# Patient Record
Sex: Male | Born: 1935 | Race: Black or African American | Hispanic: No | Marital: Married | Smoking: Former smoker
Health system: Southern US, Community
[De-identification: ages and names within clinical notes are randomized; demographics above are authoritative.]

## PROBLEM LIST (undated history)

## (undated) DIAGNOSIS — A419 Sepsis, unspecified organism: Secondary | ICD-10-CM

## (undated) DIAGNOSIS — E785 Hyperlipidemia, unspecified: Secondary | ICD-10-CM

## (undated) DIAGNOSIS — F0390 Unspecified dementia without behavioral disturbance: Secondary | ICD-10-CM

## (undated) DIAGNOSIS — I10 Essential (primary) hypertension: Secondary | ICD-10-CM

## (undated) DIAGNOSIS — N390 Urinary tract infection, site not specified: Secondary | ICD-10-CM

## (undated) DIAGNOSIS — E039 Hypothyroidism, unspecified: Secondary | ICD-10-CM

## (undated) DIAGNOSIS — I959 Hypotension, unspecified: Secondary | ICD-10-CM

## (undated) DIAGNOSIS — R55 Syncope and collapse: Secondary | ICD-10-CM

## (undated) DIAGNOSIS — I2510 Atherosclerotic heart disease of native coronary artery without angina pectoris: Secondary | ICD-10-CM

## (undated) HISTORY — PX: BACK SURGERY: SHX140

## (undated) HISTORY — PX: THYROIDECTOMY, PARTIAL: SHX18

## (undated) HISTORY — PX: CORONARY ANGIOPLASTY WITH STENT PLACEMENT: SHX49

## (undated) HISTORY — DX: Hyperlipidemia, unspecified: E78.5

---

## 1997-06-23 ENCOUNTER — Inpatient Hospital Stay (HOSPITAL_COMMUNITY): Admission: EM | Admit: 1997-06-23 | Discharge: 1997-06-26 | Payer: Self-pay | Admitting: Urology

## 1997-07-06 ENCOUNTER — Inpatient Hospital Stay (HOSPITAL_COMMUNITY): Admission: RE | Admit: 1997-07-06 | Discharge: 1997-07-08 | Payer: Self-pay | Admitting: Urology

## 1997-07-14 ENCOUNTER — Ambulatory Visit (HOSPITAL_COMMUNITY): Admission: RE | Admit: 1997-07-14 | Discharge: 1997-07-14 | Payer: Self-pay | Admitting: Pulmonary Disease

## 1997-07-15 ENCOUNTER — Ambulatory Visit (HOSPITAL_COMMUNITY): Admission: RE | Admit: 1997-07-15 | Discharge: 1997-07-15 | Payer: Self-pay | Admitting: Pulmonary Disease

## 1997-07-17 ENCOUNTER — Ambulatory Visit (HOSPITAL_COMMUNITY): Admission: RE | Admit: 1997-07-17 | Discharge: 1997-07-17 | Payer: Self-pay | Admitting: Pulmonary Disease

## 2001-04-12 ENCOUNTER — Encounter: Admission: RE | Admit: 2001-04-12 | Discharge: 2001-04-12 | Payer: Self-pay | Admitting: Urology

## 2001-04-12 ENCOUNTER — Encounter: Payer: Self-pay | Admitting: Urology

## 2001-04-16 ENCOUNTER — Ambulatory Visit (HOSPITAL_BASED_OUTPATIENT_CLINIC_OR_DEPARTMENT_OTHER): Admission: RE | Admit: 2001-04-16 | Discharge: 2001-04-16 | Payer: Self-pay | Admitting: Urology

## 2002-01-09 ENCOUNTER — Encounter: Payer: Self-pay | Admitting: Orthopedic Surgery

## 2002-01-09 ENCOUNTER — Encounter: Admission: RE | Admit: 2002-01-09 | Discharge: 2002-01-09 | Payer: Self-pay | Admitting: Orthopedic Surgery

## 2002-02-25 ENCOUNTER — Encounter: Payer: Self-pay | Admitting: Emergency Medicine

## 2002-02-25 ENCOUNTER — Inpatient Hospital Stay (HOSPITAL_COMMUNITY): Admission: EM | Admit: 2002-02-25 | Discharge: 2002-02-28 | Payer: Self-pay | Admitting: Emergency Medicine

## 2002-02-25 HISTORY — PX: CARDIAC CATHETERIZATION: SHX172

## 2002-03-13 HISTORY — PX: TRANSTHORACIC ECHOCARDIOGRAM: SHX275

## 2002-09-24 ENCOUNTER — Encounter: Payer: Self-pay | Admitting: Gastroenterology

## 2002-09-24 ENCOUNTER — Encounter: Admission: RE | Admit: 2002-09-24 | Discharge: 2002-09-24 | Payer: Self-pay | Admitting: Gastroenterology

## 2002-10-24 ENCOUNTER — Ambulatory Visit (HOSPITAL_COMMUNITY): Admission: RE | Admit: 2002-10-24 | Discharge: 2002-10-24 | Payer: Self-pay | Admitting: Gastroenterology

## 2002-10-27 ENCOUNTER — Encounter: Payer: Self-pay | Admitting: Gastroenterology

## 2002-10-27 ENCOUNTER — Ambulatory Visit (HOSPITAL_COMMUNITY): Admission: RE | Admit: 2002-10-27 | Discharge: 2002-10-27 | Payer: Self-pay | Admitting: Gastroenterology

## 2004-10-14 ENCOUNTER — Encounter: Admission: RE | Admit: 2004-10-14 | Discharge: 2004-10-14 | Payer: Self-pay | Admitting: Neurosurgery

## 2005-01-23 ENCOUNTER — Encounter: Admission: RE | Admit: 2005-01-23 | Discharge: 2005-01-23 | Payer: Self-pay | Admitting: Neurosurgery

## 2005-04-01 ENCOUNTER — Emergency Department (HOSPITAL_COMMUNITY): Admission: EM | Admit: 2005-04-01 | Discharge: 2005-04-01 | Payer: Self-pay | Admitting: Emergency Medicine

## 2005-08-17 HISTORY — PX: OTHER SURGICAL HISTORY: SHX169

## 2006-12-18 HISTORY — PX: CARDIOVASCULAR STRESS TEST: SHX262

## 2007-03-31 ENCOUNTER — Emergency Department (HOSPITAL_COMMUNITY): Admission: EM | Admit: 2007-03-31 | Discharge: 2007-03-31 | Payer: Self-pay | Admitting: Emergency Medicine

## 2008-10-21 ENCOUNTER — Encounter: Admission: RE | Admit: 2008-10-21 | Discharge: 2008-12-31 | Payer: Self-pay | Admitting: Optometry

## 2010-01-22 ENCOUNTER — Encounter: Payer: Self-pay | Admitting: Gastroenterology

## 2010-01-23 ENCOUNTER — Encounter: Payer: Self-pay | Admitting: Radiology

## 2010-05-20 NOTE — Cardiovascular Report (Signed)
NAME:  Jeremy [name], Jeremy [name] NO.:  [id number]   MEDICAL RECORD NO.:  [id number]                   PATIENT TYPE:  INP   LOCATION:  1825                                 FACILITY:  [facility]   PHYSICIAN:  Nanetta Batty, M.D.                DATE OF BIRTH:  August 07, 1925   DATE OF PROCEDURE:  DATE OF DISCHARGE:                              CARDIAC CATHETERIZATION   PROCEDURE:  Emergency cardiac catheterization/PCI and stenting.   INDICATIONS FOR PROCEDURE:  The patient is a 77 year old retired black male,  father of 4, patient of Dr. Kellie Shropshire.  He was awakened at 4:30 this  morning with chest pain and left arm pain.  He came to the emergency room  where he was found to have nonspecific ST and T wave changes and positive  enzymes.  Because of continued discomfort, he was brought to the  catheterization lab emergently for angiography and intervention.   DESCRIPTION OF PROCEDURE:  The patient was brought to the second floor [facility] in the postabsorptive state.  He was  premedicated with p.o. Valium.  His right groin was prepped and shaved in  the usual sterile fashion.  Xylocaine, 1%, was used for local anesthesia.  A  6-French, upgraded to a 7-French, sheath was inserted into the right femoral  artery using the standard Seldinger technique.  A 6-French sheath was  inserted into the right femoral vein.  The 6-French right and left Judkins  diagnostic catheters along with a 6-French pigtail catheter were used for  selective coronary angiography, left ventriculography, subselective left  internal mammary artery angiography, and distal abdominal aortography.  Omnipaque dye was used for the entirety of the case.  Retrograde aortic and  left ventricular pullback pressures were recorded.   HEMODYNAMICS:  1. Aortic systolic pressure 137, diastolic pressure 86.  2. Left ventricular systolic pressure 134, end diastolic pressure 13.   SELECTIVE CORONARY ANGIOGRAPHY:  1. Left main:  Normal.  2. LAD:  Normal.  3. Left circumflex:  The left circumflex had a 95% ulcerated lesion in its     proximal portion, straddling a small marginal branch.  4. Right coronary artery:  This is a codominant vessel with a 50% distal     segmental stenosis.  5. Left ventriculography:  RAO left ventriculogram was performed using 25 cc     of Omnipaque dye at 12 cc/second.  The overall LVEF was estimated at     greater than 60% with subtle inferobasal hypokinesia.  6. Left internal mammary artery:  This vessel was subselectively visualized     and was widely patent.  It is suitable for use during coronary artery     bypass grafting.  7. Distal abdominal aortography:  Omnipaque dye, 20 cc at 20 cc/second, was     used.  The renal arteries revealed 40% right renal artery stenosis.  Infrarenal abdominal aorta and iliac bifurcation appear free of     significant obstruction.   IMPRESSION:  The patient has SEMI with the circumflex being the culprit  vessel.  We will proceed with PCI and stenting; he is taking Integrilin.   PROCEDURE DESCRIPTION:  The patient received 5000 units of heparin  intravenously in the ER with an ACT of 305 at the end of the procedure.  He  received 4 baby aspirin and 300 mg of Plavix p.o. as well as Integrilin  double bolus and infusion. Using a 7-French JL4 guide catheter and also a  0.014-inch x 190-cm support guidewire, and a 2.5 x 15-mm Maverick, PTCA was  performed.  Following this, a 3.0 x 18-mm CYPHER stent was then deployed  across the stenosis, carefully avoiding the second large marginal branch and  jailing the first smaller marginal branch.  It was deployed at 13  atmospheres.  Intracoronary nitroglycerin, 200 mcg, was administered.  Final  angiographic result revealed reduction of a 90% to 95% ulcerated proximal  left circumflex coronary artery stenosis to 0% residual.  There was TIMI-2  flow in the  small first marginal branch at the end of the procedure.  Attempts were made to cannulate this with an 0.014-inch support wire;  however, because of the nature of the takeoff, the vessel was never able to  be entered with the wire.  The patient was pain-free, and there were no  electrocardiographic changes at the end of the procedure.  The guidewire and  catheters were removed.  Sheaths were secured in place.  The patient left  the lab in stable condition.  Sheaths will be removed once the ACT falls  below 150.  Integrilin will be continued for 18 hours.  The patient will be  treated with aspirin and Plavix, beta blocker, ACE inhibitor, and a statin  drug.  He left the lab in stable condition.                                               Nanetta Batty, M.D.    Cordelia Pen  D:  02/25/2002  T:  02/25/2002  Job:  [id number]   cc:   Cardiac Catheterization Lab   [facility]   [place]. Renae Gloss, M.D.  [address]  [address]  [place]  [place] [address]  Fax: [phone]

## 2010-05-20 NOTE — Discharge Summary (Signed)
NAME:  Jeremy [name], Jeremy [name] NO.:  [id number]   MEDICAL RECORD NO.:  [id number]                   PATIENT TYPE:  INP   LOCATION:  4740                                 FACILITY:  [facility]   PHYSICIAN:  Nanetta Batty, M.D.                DATE OF BIRTH:  07/25/1925   DATE OF ADMISSION:  DATE OF DISCHARGE:  02/28/2002                                 DISCHARGE SUMMARY   DISCHARGE DIAGNOSES:  1. Status post known Q wave myocardial infarction, this admission.  2. Status post catheterization with stenting of the midcircumflex artery.  3. Newly diagnosed hyperlipidemia with abnormal lipid profile, statin     therapy started.  4. Newly diagnosed hyperthyroidism status post thyroid surgery a number of     years ago.  5. Newly diagnosed left subclavian murmur versus transmitted cardiac murmur     and diminished left radial pulse.  6. Arthritis.   HISTORY OF PRESENT ILLNESS:  This patient is a 77 year old African-American  married gentleman with no prior history of coronary artery disease and no  hypertension or diabetes and unknown lipid status.  He presented to the  emergency room with complaints of severe chest pain, left arm and left  shoulder pain.  The onset of pain occurred during the night at around 4: 30  a.m. and the patient was delivered to the emergency room around 6 a.m.  On  admission, his EKG did not show specific elevation of the ST segments, it  showed just ischemic depression of the ST in leads V1 through V3.   PAST MEDICAL HISTORY:  His past medical history is significant for remote  thyroid surgery a number of years ago and he has arthritis and is status  post back surgery with pain in the lower extremities.   ALLERGIES:  No known drug allergies.   FAMILY HISTORY:  Family history is not remarkable for any coronary artery  disease such as CVAs.   SOCIAL HISTORY:  He is married for 50 years.  This Saturday, tomorrow, he is  going to celebrate  his 50th wedding anniversary.  He has 4 children, 4  grandchildren.  Retired from [employer].  Ex-smoker,  approximately 35 years ago.  Exercises very little.   ALLERGIES:  No known drug allergies.   HOME MEDICATIONS:  Celebrex twice a day, dose uncertain.   HOSPITAL COURSE:  The patient was admitted on IV nitroglycerin and heparin;  and underwent cardiac catheterization the day of his admission.  Catheterization revealed approximately 50% of distal RCA lesion and concrete  lesion in the midcircumflex with 90%; PTCA and stenting with Cypher stent  was performed and occluding was reduced from 90-0%.  He still has a residual  disease from the ostial diagonal branch with 99% stenosis.  Attempt was made  to cross that lesion, but unsuccessfully unable to wire.   The patient tolerated the procedure  well.  He was seen the next morning with  no complaints.  Just said that he felt great.  The same he repeated the  morning of his discharge on 02/27; he said he felt great and was able to get  discharged from the hospital and ready for his wedding anniversary.   HOSPITAL STUDIES:  Laboratories:  TSH was abnormally elevated at 93.81 and  he was started on Synthroid 50 mcg.  His cardiac enzymes were elevated.  The  first set was CK 751, MB 13.8, troponin 0.43; second set CK 4958, CK/MB of  523.6, and peak troponin was 42.9; his last troponin was 33.46, CK 2013, and  CK/MB 118.   His CBC on the day of discharge revealed a hemoglobin of 11.3, hematocrit  34.7, potassium 3.7, BUN 18, creatinine 1.1.   His lipid profile showed total cholesterol 258, triglycerides 195, LDL 175,  and HDL 44.   The patient was assessed the morning of his discharge by Dr. Elsie Lincoln.  He has  right subclavian bruit and it was unclear to [place] if the bruit was actually  because of the stenotic lesion in the left subclavian artery or transmitted  cardiac murmur.  Also, his right radial pulse was 3+ and left  radial pulse  was 1+ to 2+.  The patient was discharged home in improved and stable  condition.   RECOMMENDATIONS:  No driving, no lifting greater than 5 pounds, no strenuous  physical activity until seen in the office by Dr. Allyson Sabal.  He was allowed to  shower and instructed not to rub groin site, but to wash it with mild soap  gently and pat dry.  Any problems need to be reported to Dr. Hazle Coca office  at [phone]. Office will call patient to set up follow up with Dr. Allyson Sabal and  also he was scheduled for a 2-D echocardiogram to assess cardiac murmur and  for upper extremity ultrasound to evaluate possible left subclavian bruit.   DISCHARGE MEDICATIONS:  1. Aspirin 81 mg daily.  2. Plavix 75 mg daily for at least 3 months after Cypher stent.  3. Toprol XL 25 mg daily.  4. Altace 2.5 mg daily.  5. Protonix 40 mg daily.  6. Synthroid 50 mcg daily.  7. Zocor 40 mg daily.     Raymon Mutton, P.A.                    Nanetta Batty, M.D.    MK/MEDQ  D:  02/28/2002  T:  02/28/2002  Job:  [id number]   cc:   Nanetta Batty, M.D.  [address]  [place]  [place] [address]  Fax: [phone]   Merlene Laughter. Renae Gloss, M.D.  [address]  [address]  [place]  [place] [address]  Fax: [phone]

## 2010-05-20 NOTE — Op Note (Signed)
   NAME:  Jeremy [name], MINION NO.:  [id number]   MEDICAL RECORD NO.:  [id number]                   PATIENT TYPE:  AMB   LOCATION:  ENDO                                 FACILITY:  [facility]   PHYSICIAN:  Anselmo Rod, M.D.               DATE OF BIRTH:  08-18-1925   DATE OF PROCEDURE:  10/24/2002  DATE OF DISCHARGE:                                 OPERATIVE REPORT   PROCEDURE PERFORMED:  Colonoscopy.   ENDOSCOPIST:  Anselmo Rod, M.D.   INSTRUMENT USED:  Olympus video colonoscope.   INDICATION FOR PROCEDURE:  A 77 year old African-American male with a  history of abnormal weight loss, undergoing screening colonoscopy.  Rule out  colonic polyps, masses, etc.   PREPROCEDURE PREPARATION:  Informed consent was procured from the patient.  The patient was fasted for eight hours prior to the procedure and prepped  with a bottle of magnesium citrate and a gallon of GoLYTELY the night prior  to the procedure.   PREPROCEDURE PHYSICAL:  VITAL SIGNS:  The patient had stable vital signs.  NECK:  Supple.  CHEST:  Clear to auscultation.  S1, S2 regular.  ABDOMEN:  Soft with normal bowel sounds.   DESCRIPTION OF PROCEDURE:  The patient was placed in the left lateral  decubitus position and sedated with 70 mg of Demerol and 7 mg of Versed  intravenously.  Once the patient was adequately sedate and maintained on low-  flow oxygen and continuous cardiac monitoring, the Olympus video colonoscope  was advanced from the rectum to the cecum and terminal ileum without  difficulty.  Except for small internal hemorrhoids, no other masses were  seen.  The patient tolerated the procedure well without complications.   IMPRESSION:  Normal colonoscopy up to the terminal ileum except for small  internal hemorrhoids.   RECOMMENDATIONS:  1. Upper GI with small bowel follow-through will be done to complete the     evaluation.  2. Repeat CRC screening has been recommended in the  next 10 years unless the     patient develops any abnormal     symptoms in the interim.  3. Outpatient follow-up after the upper GI small bowel follow-through has     been done.  4. A high-fiber diet with liberal fluid intake.                                               Anselmo Rod, M.D.    JNM/MEDQ  D:  10/24/2002  T:  10/25/2002  Job:  [id number]   cc:   Merlene Laughter. Renae Gloss, M.D.  [address]  [address]  [place]  [place] [address]  Fax: [phone]

## 2010-09-26 LAB — CBC
HCT: 39.7
Hemoglobin: 13.4
MCHC: 33.8
MCV: 92.2
RBC: 4.31

## 2010-09-26 LAB — DIFFERENTIAL
Basophils Absolute: 0
Basophils Relative: 1
Eosinophils Relative: 4
Neutrophils Relative %: 66

## 2010-09-26 LAB — POCT CARDIAC MARKERS
CKMB, poc: 7.1
Operator id: 3206
Troponin i, poc: <0.05

## 2010-09-26 LAB — BASIC METABOLIC PANEL
BUN: 20
Calcium: 9.3
GFR calc Af Amer: 45 — ABNORMAL LOW
GFR calc non Af Amer: 38 — ABNORMAL LOW
Potassium: 4

## 2011-04-09 ENCOUNTER — Other Ambulatory Visit: Payer: Self-pay

## 2011-04-09 ENCOUNTER — Encounter (HOSPITAL_COMMUNITY): Payer: Self-pay

## 2011-04-09 ENCOUNTER — Emergency Department (HOSPITAL_COMMUNITY)
Admission: EM | Admit: 2011-04-09 | Discharge: 2011-04-09 | Disposition: A | Discharge status: Home / Self Care | Payer: Medicare Other | Attending: Emergency Medicine | Admitting: Emergency Medicine

## 2011-04-09 DIAGNOSIS — R5383 Other fatigue: Secondary | ICD-10-CM | POA: Insufficient documentation

## 2011-04-09 DIAGNOSIS — N390 Urinary tract infection, site not specified: Secondary | ICD-10-CM | POA: Insufficient documentation

## 2011-04-09 DIAGNOSIS — R55 Syncope and collapse: Secondary | ICD-10-CM | POA: Insufficient documentation

## 2011-04-09 DIAGNOSIS — R5381 Other malaise: Secondary | ICD-10-CM | POA: Insufficient documentation

## 2011-04-09 LAB — URINALYSIS, ROUTINE W REFLEX MICROSCOPIC
Ketones, ur: NEGATIVE mg/dL
Nitrite: NEGATIVE
Protein, ur: 30 mg/dL — AB
Specific Gravity, Urine: 1.019 (ref 1.005–1.030)
Urobilinogen, UA: 1 mg/dL (ref 0.0–1.0)

## 2011-04-09 LAB — URINE MICROSCOPIC-ADD ON

## 2011-04-09 LAB — COMPREHENSIVE METABOLIC PANEL
ALT: 12 U/L (ref 0–53)
Albumin: 3.5 g/dL (ref 3.5–5.2)
Alkaline Phosphatase: 59 U/L (ref 39–117)
BUN: 16 mg/dL (ref 6–23)
CO2: 23 mEq/L (ref 19–32)
Chloride: 104 mEq/L (ref 96–112)
Creatinine, Ser: 1.21 mg/dL (ref 0.50–1.35)
GFR calc non Af Amer: 54 mL/min — ABNORMAL LOW (ref >90)
Potassium: 4.9 mEq/L (ref 3.5–5.1)
Total Protein: 7.2 g/dL (ref 6.0–8.3)

## 2011-04-09 LAB — DIFFERENTIAL
Basophils Absolute: 0.1 10*3/uL (ref 0.0–0.1)
Lymphs Abs: 2.6 10*3/uL (ref 0.7–4.0)
Neutro Abs: 3.7 10*3/uL (ref 1.7–7.7)

## 2011-04-09 LAB — CBC
MCH: 28.8 pg (ref 26.0–34.0)
MCHC: 33.8 g/dL (ref 30.0–36.0)

## 2011-04-09 LAB — OCCULT BLOOD, POC DEVICE: Fecal Occult Bld: NEGATIVE

## 2011-04-09 LAB — POCT I-STAT TROPONIN I: Troponin i, poc: 0.01 ng/mL (ref 0.00–0.08)

## 2011-04-09 MED ORDER — CIPROFLOXACIN HCL 500 MG PO TABS: 500.0000 mg | ORAL_TABLET | Freq: Two times a day (BID) | ORAL | Status: AC

## 2011-04-09 NOTE — ED Notes (Signed)
Patient denies pain and is resting comfortably.  

## 2011-04-09 NOTE — ED Notes (Signed)
Pt reports he set down after communion, became nauseous and felt hot all over, family at bedside reports pt began sweating. Pt reports he set down because his ankle always get weak after standing for a long period of time d/t hx of sciatica. Pt denies LOC, hitting his head, or falling. Pt denies chest/abd pain, diarrhea, sob, dizziness, or recent cough and congestion. Pt and family reports he had a similar episode in September while traveling to [place].

## 2011-04-09 NOTE — Discharge Instructions (Signed)
[name], you had physical examination, laboratory tests, EKG and chest x-ray to check on you after you nearly fainted today.  Your tests showed that you have a urinary tract infection. Take the antibiotic medicine Cipro 500 mg twice a day for one week to treat your urinary tract infection.  Call Dr. Mathews Robinsons office to make a followup appointment.

## 2011-04-09 NOTE — ED Notes (Signed)
Family at bedside. 

## 2011-04-09 NOTE — ED Notes (Signed)
Per EMS, pt from church, pt stood up for communion became nauseated and weak, pt was a/o x3 for ems, vomited x4 and diaphoretic, clammy to touch, bp 90/60 hr 58 upon ed arrival, currently bp 120/81, Zofran 4 mg IM given en route, unable to obtain IV access en route

## 2011-04-09 NOTE — ED Notes (Signed)
MD at bedside. 

## 2011-04-09 NOTE — ED Notes (Signed)
Pt reports his suit jacket was left on the ambulance, pt's daughter given [facility] communication number and instructions on how to contact them for pt's belongings

## 2011-04-09 NOTE — ED Provider Notes (Signed)
History     CSN: [id number]  Arrival date & time 04/09/11  1004   None     Chief Complaint  Patient presents with  . Near Syncope    (Consider location/radiation/quality/duration/timing/severity/associated sxs/prior treatment) HPI Comments: The patient is an 78 year old man who was at church. He got up to go to communion, and became weak, with nausea and sweating. He had to sit down. He says this spell lasted for 3 or 4 minutes. He was therefore brought to [facility] cone the ED for evaluation. He denies chest pain or shortness of breath. He had a prior similar episode last year. At that time he was found to have a bleeding ulcer. Has not seen any vomiting, or blood in his stool.  Patient is a 78 y.o. male presenting with syncope. The history is provided by the patient and a relative. No language interpreter was used.  Loss of Consciousness This is a new problem. The current episode started less than 1 hour ago. The problem has been resolved. Pertinent negatives include no chest pain, no abdominal pain, no headaches and no shortness of breath. The symptoms are aggravated by nothing. The symptoms are relieved by nothing. Treatments tried: Was brought to the [facility] ED via EMS.    No past medical history on file.  No past surgical history on file.  No family history on file.  History  Substance Use Topics  . Smoking status: Not on file  . Smokeless tobacco: Not on file  . Alcohol Use: Not on file      Review of Systems  Constitutional:       Weakness, near syncope.  HENT: Negative.   Eyes: Negative.   Respiratory: Negative.  Negative for shortness of breath.   Cardiovascular: Positive for syncope. Negative for chest pain.  Gastrointestinal: Negative for abdominal pain.  Genitourinary: Negative.   Musculoskeletal: Negative.   Skin: Negative.   Neurological: Negative.  Negative for headaches.  Psychiatric/Behavioral: Negative.     Allergies  Review of patient's  allergies indicates not on file.  Home Medications  No current outpatient prescriptions on file.  BP 119/75  Pulse 56  Temp(Src) 97.5 F (36.4 C) (Oral)  Resp 18  SpO2 99%  Physical Exam  Nursing note and vitals reviewed. Constitutional: He is oriented to person, place, and time. He appears well-developed and well-nourished. No distress.  HENT:  Head: Normocephalic and atraumatic.  Right Ear: External ear normal.  Left Ear: External ear normal.  Eyes: Conjunctivae and EOM are normal. Pupils are equal, round, and reactive to light.  Neck: Normal range of motion. Neck supple.  Cardiovascular: Normal rate, regular rhythm and normal heart sounds.   Pulmonary/Chest: Effort normal and breath sounds normal.  Abdominal: Soft. Bowel sounds are normal.  Musculoskeletal: Normal range of motion. He exhibits no edema and no tenderness.  Neurological: He is alert and oriented to person, place, and time.       No sensory or motor deficit.  Skin: Skin is warm and dry.  Psychiatric: He has a normal mood and affect. His behavior is normal.    ED Course  Procedures (including critical care time)   10:31 AM  Date: 04/09/2011  Rate:53  Rhythm: sinus bradycardia  QRS Axis: left  Intervals: normal QRS:  Left ventricular hypertrophy.  Poor R wave progression in precordial leads suggests possible old anterior myocardial infarction.   ST/T Wave abnormalities: normal  Conduction Disutrbances:right bundle branch block  Narrative Interpretation: Abnormal EKG.  Old  EKG Reviewed: changes noted--Poor R wave progression, RBBB are new since tracing of 03/31/2007.  11:13 AM Pt was seen and had physical exam performed.  Lab workup was ordered.  Old charts were ordered.  3:24 PM Results for orders placed during the hospital encounter of 04/09/11  GLUCOSE, CAPILLARY      Component Value Range   Glucose-Capillary 91  70 - 99 (mg/dL)  CBC      Component Value Range   WBC 8.0  4.0 - 10.5 (K/uL)   RBC  4.66  4.22 - 5.81 (MIL/uL)   Hemoglobin 13.4  13.0 - 17.0 (g/dL)   HCT 16.1  09.6 - 04.5 (%)   MCV 85.0  78.0 - 100.0 (fL)   MCH 28.8  26.0 - 34.0 (pg)   MCHC 33.8  30.0 - 36.0 (g/dL)   RDW 40.9 (*) 81.1 - 15.5 (%)   Platelets 154  150 - 400 (K/uL)  DIFFERENTIAL      Component Value Range   Neutrophils Relative 46  43 - 77 (%)   Neutro Abs 3.7  1.7 - 7.7 (K/uL)   Lymphocytes Relative 33  12 - 46 (%)   Lymphs Abs 2.6  0.7 - 4.0 (K/uL)   Monocytes Relative 16 (*) 3 - 12 (%)   Monocytes Absolute 1.3 (*) 0.1 - 1.0 (K/uL)   Eosinophils Relative 5  0 - 5 (%)   Eosinophils Absolute 0.4  0.0 - 0.7 (K/uL)   Basophils Relative 1  0 - 1 (%)   Basophils Absolute 0.1  0.0 - 0.1 (K/uL)  COMPREHENSIVE METABOLIC PANEL      Component Value Range   Sodium 137  135 - 145 (mEq/L)   Potassium 4.9  3.5 - 5.1 (mEq/L)   Chloride 104  96 - 112 (mEq/L)   CO2 23  19 - 32 (mEq/L)   Glucose, Bld 101 (*) 70 - 99 (mg/dL)   BUN 16  6 - 23 (mg/dL)   Creatinine, Ser 9.14  0.50 - 1.35 (mg/dL)   Calcium 9.1  8.4 - 78.2 (mg/dL)   Total Protein 7.2  6.0 - 8.3 (g/dL)   Albumin 3.5  3.5 - 5.2 (g/dL)   AST 33  0 - 37 (U/L)   ALT 12  0 - 53 (U/L)   Alkaline Phosphatase 59  39 - 117 (U/L)   Total Bilirubin 0.4  0.3 - 1.2 (mg/dL)   GFR calc non Af Amer 54 (*) >90 (mL/min)   GFR calc Af Amer 62 (*) >90 (mL/min)  POCT I-STAT TROPONIN I      Component Value Range   Troponin i, poc 0.01  0.00 - 0.08 (ng/mL)   Comment 3           URINALYSIS, ROUTINE W REFLEX MICROSCOPIC      Component Value Range   Color, Urine AMBER (*) YELLOW    APPearance TURBID (*) CLEAR    Specific Gravity, Urine 1.019  1.005 - 1.030    pH 6.5  5.0 - 8.0    Glucose, UA NEGATIVE  NEGATIVE (mg/dL)   Hgb urine dipstick MODERATE (*) NEGATIVE    Bilirubin Urine SMALL (*) NEGATIVE    Ketones, ur NEGATIVE  NEGATIVE (mg/dL)   Protein, ur 30 (*) NEGATIVE (mg/dL)   Urobilinogen, UA 1.0  0.0 - 1.0 (mg/dL)   Nitrite NEGATIVE  NEGATIVE    Leukocytes,  UA LARGE (*) NEGATIVE   URINE MICROSCOPIC-ADD ON      Component Value Range  WBC, UA TOO NUMEROUS TO COUNT  <3 (WBC/hpf)   RBC / HPF 7-10  <3 (RBC/hpf)   Bacteria, UA MANY (*) RARE   OCCULT BLOOD, POC DEVICE      Component Value Range   Fecal Occult Bld NEGATIVE     No results found.  Pt's labs showed a UTI.  Will Rx with po Cipro.  He feels back to normal, so released home.      1. Urinary tract infection   2. Near syncope             Carleene Cooper III, MD 04/09/11 [phone]

## 2011-04-09 NOTE — ED Notes (Signed)
Pt reports he has not been drinking enough water but eats on a regular schedule

## 2012-02-09 ENCOUNTER — Emergency Department (HOSPITAL_COMMUNITY): Payer: Medicare Other

## 2012-02-09 ENCOUNTER — Emergency Department (HOSPITAL_COMMUNITY)
Admission: EM | Admit: 2012-02-09 | Discharge: 2012-02-09 | Disposition: A | Discharge status: Home / Self Care | Payer: Medicare Other | Attending: Emergency Medicine | Admitting: Emergency Medicine

## 2012-02-09 ENCOUNTER — Encounter (HOSPITAL_COMMUNITY): Payer: Self-pay | Admitting: Emergency Medicine

## 2012-02-09 DIAGNOSIS — Z87891 Personal history of nicotine dependence: Secondary | ICD-10-CM | POA: Insufficient documentation

## 2012-02-09 DIAGNOSIS — S0990XA Unspecified injury of head, initial encounter: Secondary | ICD-10-CM | POA: Insufficient documentation

## 2012-02-09 DIAGNOSIS — Y9289 Other specified places as the place of occurrence of the external cause: Secondary | ICD-10-CM | POA: Insufficient documentation

## 2012-02-09 DIAGNOSIS — R55 Syncope and collapse: Secondary | ICD-10-CM | POA: Insufficient documentation

## 2012-02-09 DIAGNOSIS — I10 Essential (primary) hypertension: Secondary | ICD-10-CM | POA: Insufficient documentation

## 2012-02-09 DIAGNOSIS — Z7902 Long term (current) use of antithrombotics/antiplatelets: Secondary | ICD-10-CM | POA: Insufficient documentation

## 2012-02-09 DIAGNOSIS — E079 Disorder of thyroid, unspecified: Secondary | ICD-10-CM | POA: Insufficient documentation

## 2012-02-09 DIAGNOSIS — W1809XA Striking against other object with subsequent fall, initial encounter: Secondary | ICD-10-CM | POA: Insufficient documentation

## 2012-02-09 DIAGNOSIS — Z9861 Coronary angioplasty status: Secondary | ICD-10-CM | POA: Insufficient documentation

## 2012-02-09 DIAGNOSIS — Z8719 Personal history of other diseases of the digestive system: Secondary | ICD-10-CM | POA: Insufficient documentation

## 2012-02-09 DIAGNOSIS — Z7982 Long term (current) use of aspirin: Secondary | ICD-10-CM | POA: Insufficient documentation

## 2012-02-09 DIAGNOSIS — N390 Urinary tract infection, site not specified: Secondary | ICD-10-CM | POA: Insufficient documentation

## 2012-02-09 DIAGNOSIS — Z79899 Other long term (current) drug therapy: Secondary | ICD-10-CM | POA: Insufficient documentation

## 2012-02-09 DIAGNOSIS — Y9301 Activity, walking, marching and hiking: Secondary | ICD-10-CM | POA: Insufficient documentation

## 2012-02-09 DIAGNOSIS — Z8739 Personal history of other diseases of the musculoskeletal system and connective tissue: Secondary | ICD-10-CM | POA: Insufficient documentation

## 2012-02-09 LAB — URINALYSIS, ROUTINE W REFLEX MICROSCOPIC
Hgb urine dipstick: NEGATIVE
Ketones, ur: NEGATIVE mg/dL
Protein, ur: NEGATIVE mg/dL
Urobilinogen, UA: 0.2 mg/dL (ref 0.0–1.0)

## 2012-02-09 LAB — CBC WITH DIFFERENTIAL/PLATELET
Basophils Absolute: 0.1 10*3/uL (ref 0.0–0.1)
Basophils Relative: 1 % (ref 0–1)
Eosinophils Absolute: 0.5 10*3/uL (ref 0.0–0.7)
Eosinophils Relative: 6 % — ABNORMAL HIGH (ref 0–5)
MCH: 29.6 pg (ref 26.0–34.0)
MCHC: 33.6 g/dL (ref 30.0–36.0)
MCV: 87.9 fL (ref 78.0–100.0)
Platelets: 143 10*3/uL — ABNORMAL LOW (ref 150–400)
RDW: 14.4 % (ref 11.5–15.5)
WBC: 7.6 10*3/uL (ref 4.0–10.5)

## 2012-02-09 LAB — POCT I-STAT TROPONIN I
Troponin i, poc: 0.01 ng/mL (ref 0.00–0.08)
Troponin i, poc: 0 ng/mL (ref 0.00–0.08)

## 2012-02-09 LAB — BASIC METABOLIC PANEL
Calcium: 9.3 mg/dL (ref 8.4–10.5)
Creatinine, Ser: 0.98 mg/dL (ref 0.50–1.35)
GFR calc non Af Amer: 73 mL/min — ABNORMAL LOW (ref >90)
Sodium: 137 mEq/L (ref 135–145)

## 2012-02-09 LAB — URINE MICROSCOPIC-ADD ON

## 2012-02-09 MED ORDER — CEPHALEXIN 500 MG PO CAPS: 500.0000 mg | ORAL_CAPSULE | Freq: Two times a day (BID) | ORAL | Status: DC

## 2012-02-09 NOTE — ED Notes (Signed)
The urine specimen that was collected was not enough urine to process both urine tests ordered; Jeraldine Loots, MD notified and urine was sent to process only one test at this time; Tiffany, RN made aware

## 2012-02-09 NOTE — ED Provider Notes (Signed)
History     CSN: [id number]  Arrival date & time 02/09/12  1340   First MD Initiated Contact with Patient 02/09/12 1347      Chief Complaint  Patient presents with  . Near Syncope  . Fall    (Consider location/radiation/quality/duration/timing/severity/associated sxs/prior treatment) HPI Comments: Patient is an 79 year old male with a past medical history of hypertension who presents after a syncopal episode that occurred today. Patient reports eating cereal and feeling nauseous for about an hour. He then went to stand up from the seated position at the table. He walked into the next room and "passed out" and hit his head on the wall on the way down. Patient reports LOC. Patient had an episode of emesis as well. Patient denies any other associated symptoms. He reports having syncopal episodes in the past and has been evaluated for syncope. Patient was seen here in April 2013 for the same thing and was diagnosed with a UTI. Patient takes Plavix for previous stent placement.    Past Medical History  Diagnosis Date  . Bleeding ulcer   . Spinal stenosis   . Thyroid disease   . Hypertension     Past Surgical History  Procedure Date  . Coronary angioplasty with stent placement   . Thyroidectomy, partial     History reviewed. No pertinent family history.  History  Substance Use Topics  . Smoking status: Former Games developer  . Smokeless tobacco: Not on file  . Alcohol Use: No      Review of Systems  Neurological: Positive for syncope.  All other systems reviewed and are negative.    Allergies  Review of patient's allergies indicates no known allergies.  Home Medications   Current Outpatient Rx  Name  Route  Sig  Dispense  Refill  . ASPIRIN EC 81 MG PO TBEC   Oral   Take 81 mg by mouth daily.         Marland Kitchen CLOPIDOGREL BISULFATE 75 MG PO TABS   Oral   Take 75 mg by mouth daily.         Marland Kitchen LEVOTHYROXINE SODIUM 75 MCG PO TABS   Oral   Take 75 mcg by mouth daily before  breakfast.         . METOPROLOL SUCCINATE ER 25 MG PO TB24   Oral   Take 25 mg by mouth daily.         Marland Kitchen PANTOPRAZOLE SODIUM 40 MG PO TBEC   Oral   Take 40 mg by mouth daily.         Marland Kitchen SIMVASTATIN 40 MG PO TABS   Oral   Take 40 mg by mouth every morning.           BP 146/96  Pulse 65  Temp 97.8 F (36.6 C) (Oral)  SpO2 98%  Physical Exam  Nursing note and vitals reviewed. Constitutional: He is oriented to person, place, and time. He appears well-developed and well-nourished. No distress.  HENT:  Head: Normocephalic and atraumatic.  Mouth/Throat: Oropharynx is clear and moist. No oropharyngeal exudate.  Eyes: Conjunctivae normal and EOM are normal. Pupils are equal, round, and reactive to light.  Neck:       c-collar intact  Cardiovascular: Normal rate and regular rhythm.  Exam reveals no gallop and no friction rub.   No murmur heard. Pulmonary/Chest: Effort normal and breath sounds normal. He has no wheezes. He has no rales. He exhibits no tenderness.  Abdominal: Soft. He exhibits  no distension. There is no tenderness. There is no rebound and no guarding.  Musculoskeletal: Normal range of motion.  Neurological: He is alert and oriented to person, place, and time. No cranial nerve deficit. Coordination normal.       Strength and sensation equal and intact bilaterally. Cerebellar testing done without difficulty. Speech is goal-oriented. Moves limbs without ataxia.   Skin: Skin is warm and dry. He is not diaphoretic.  Psychiatric: He has a normal mood and affect. His behavior is normal.    ED Course  Procedures (including critical care time)  Labs Reviewed  CBC WITH DIFFERENTIAL - Abnormal; Notable for the following:    Platelets 143 (*)     Eosinophils Relative 6 (*)     All other components within normal limits  BASIC METABOLIC PANEL - Abnormal; Notable for the following:    GFR calc non Af Amer 73 (*)     GFR calc Af Amer 85 (*)     All other components  within normal limits  POCT I-STAT TROPONIN I  URINALYSIS, ROUTINE W REFLEX MICROSCOPIC  URINE CULTURE   Dg Chest 2 View  02/09/2012  *RADIOLOGY REPORT*  Clinical Data: Near syncope, fall.  CHEST - 2 VIEW  Comparison: March 31, 2007.  Findings: Cardiomediastinal silhouette appears normal.  No acute pulmonary disease is noted.  Bony thorax is intact.  Hyperinflation of the lungs is noted.  IMPRESSION: No acute cardiopulmonary abnormality seen.   Original Report Authenticated By: Lupita Raider.,  M.D.    Ct Head Wo Contrast  02/09/2012  *RADIOLOGY REPORT*  Clinical Data: There is syncope.  Fall.  CT HEAD WITHOUT CONTRAST  Technique:  Contiguous axial images were obtained from the base of the skull through the vertex without contrast.  Comparison: None.  Findings: There is no acute intracranial hemorrhage, infarction, or mass lesion.  Mild diffuse cerebral cortical atrophy with minimal periventricular white matter lucency consistent with small vessel ischemic disease.  Osseous structures are normal.  IMPRESSION: No acute intracranial abnormality.  Mild atrophy.  Minimal chronic small vessel ischemic disease.   Original Report Authenticated By: Francene Boyers, M.D.    Ct Cervical Spine Wo Contrast  02/09/2012  *RADIOLOGY REPORT*  Clinical Data: Near syncope.  Fall.  CT CERVICAL SPINE WITHOUT CONTRAST  Technique:  Multidetector CT imaging of the cervical spine was performed. Multiplanar CT image reconstructions were also generated.  Comparison: None.  Findings: There is no acute fracture or subluxation.  No prevertebral soft tissue swelling.  The patient has multilevel degenerative disc and joint disease, most severe at C5-6 and C6-7.  IMPRESSION: No acute abnormality of the cervical spine.  Multilevel degenerative disc and joint disease.   Original Report Authenticated By: Francene Boyers, M.D.      1. UTI (urinary tract infection)   2. Syncope       MDM  2:14 PM CT head and neck pending. Labs pending.  Chest xray pending. Patient denies current pain.   3:39 PM CT, chest xray, troponin, and labs unremarkable. Urinalysis pending. Patient can be discharged when labs and urine results.   3:45 PM Patient signed out to [facility] Maralyn Sago, PA-C.       Emilia Beck, PA-C 02/12/12 1952

## 2012-02-09 NOTE — ED Notes (Signed)
Discharge and follow up instructions reviewed with Pt and family. Pt verbalized understanding.

## 2012-02-09 NOTE — ED Provider Notes (Signed)
Medical screening examination/treatment/procedure(s) were conducted as a shared visit with non-physician practitioner(s) and myself.  I personally evaluated the patient during the encounter  Pt with nausea and syncope with head injury  He has had similar episodes in the past including hospitalization in Dec while out of town without definite cause found. Feeling fine now.   Kaio Kuhlman B. Bernette Mayers, MD 02/09/12 2246

## 2012-02-09 NOTE — ED Notes (Signed)
Pt made aware that urine specimen is needed, fluids offered.

## 2012-02-09 NOTE — ED Notes (Signed)
Per EMS pt has hx of chronic back pain. Pt took tramadol for pain around 1100 and ate cereal shortly afterwards. Pt began to have nausea about an hr and a half after, pt got up from table and fell to the floor. Daughter witnessed fall unsure of LOC, pt was talking to her shortly after fall and had a episode of emesis. Pt has no complaints at this time. Rates back pain 3/10. Vital signs stable.

## 2012-10-21 ENCOUNTER — Ambulatory Visit (INDEPENDENT_AMBULATORY_CARE_PROVIDER_SITE_OTHER): Payer: Medicare Other | Admitting: Cardiovascular Disease

## 2012-10-21 ENCOUNTER — Encounter: Payer: Self-pay | Admitting: Cardiovascular Disease

## 2012-10-21 VITALS — BP 158/90 | HR 67 | Ht 71.0 in | Wt 159.0 lb

## 2012-10-21 DIAGNOSIS — I739 Peripheral vascular disease, unspecified: Secondary | ICD-10-CM

## 2012-10-21 DIAGNOSIS — I10 Essential (primary) hypertension: Secondary | ICD-10-CM

## 2012-10-21 DIAGNOSIS — I2510 Atherosclerotic heart disease of native coronary artery without angina pectoris: Secondary | ICD-10-CM

## 2012-10-21 DIAGNOSIS — E785 Hyperlipidemia, unspecified: Secondary | ICD-10-CM

## 2012-10-21 NOTE — Assessment & Plan Note (Signed)
Status post non-ST segment elevation myocardial infarction February 2004 2 with stenting of the circumflex coronary artery with a Cypher drug-eluting stent. His last Myoview performed December of 2000 and it was nonischemic. He denies chest pain or shortness of breath.

## 2012-10-21 NOTE — Patient Instructions (Signed)
  We will see you back in follow up in 1 year   Dr Allyson Sabal has ordered lower extremity arterial dopplers

## 2012-10-21 NOTE — Assessment & Plan Note (Signed)
Patient complains of bilateral calf claudication with minimal ambulation in his house. He does have 2+ pedal pulses. I'm going to get lower extremity arterial Doppler studies on him.

## 2012-10-21 NOTE — Progress Notes (Signed)
10/21/2012 Jeremy [name]   [birth year]  [id number]  Primary Physician [name], MD Primary Cardiologist: Jeremy Gess MD Jeremy [name]   HPI:  The patient is a very pleasant 79 year old, thin-appearing, married [place] American male, father of 4, grandfather to 4 grandchildren who is accompanied by one of his daughters today, different from the one that I saw a year ago. He has a history of CAD status post non-ST-segment-elevation myocardial infarction February 2004 treated with stenting of a circumflex using a Cypher drug-eluting stent. At that time, he had a 50% distal RCA lesion and normal LV function. He also had a 40% right renal artery stenosis. He denies chest pain or shortness of breath. Dr. Renae [name] has been following his lipid profile. He was hospitalized a year ago with abdominal pain, was found to have a nonbleeding duodenal ulcer and a small hiatal hernia. His Aleve was stopped. Since I saw him a year ago he denies chest pain or shortness of breath. 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 He also complains of bilateral calf claudication.     Current Outpatient Prescriptions  Medication Sig Dispense Refill  . aspirin EC 81 MG tablet Take 81 mg by mouth daily.      Marland Kitchen atorvastatin (LIPITOR) 40 MG tablet Take 40 mg by mouth daily.      . clopidogrel (PLAVIX) 75 MG tablet Take 75 mg by mouth daily.      . dorzolamide (TRUSOPT) 2 % ophthalmic solution Place 1 drop into both eyes daily.       . famotidine (PEPCID) 20 MG tablet Take 20 mg by mouth daily.      . isosorbide mononitrate (IMDUR) 30 MG 24 hr tablet Take 30 mg by mouth daily.      Marland Kitchen latanoprost (XALATAN) 0.005 % ophthalmic solution Place 1 drop into both eyes at bedtime.      Marland Kitchen levothyroxine (SYNTHROID, LEVOTHROID) 88 MCG tablet Take 88 mcg by mouth daily.      Marland Kitchen losartan (COZAAR) 50 MG tablet       . metoprolol succinate (TOPROL-XL) 25 MG 24 hr tablet Take 25 mg by  mouth daily.      . timolol (TIMOPTIC) 0.5 % ophthalmic solution Place 1 drop into the right eye 2 (two) times daily.        No current facility-administered medications for this visit.    Allergies  Allergen Reactions  . Tramadol Nausea And Vomiting    History   Social History  . Marital Status: Married    Spouse Name: N/A    Number of Children: N/A  . Years of Education: N/A   Occupational History  . Not on file.   Social History Main Topics  . Smoking status: Former Games developer  . Smokeless tobacco: Not on file  . Alcohol Use: No  . Drug Use: No  . Sexual Activity:    Other Topics Concern  . Not on file   Social History Narrative  . No narrative on file     Review of Systems: General: negative for chills, fever, night sweats or weight changes.  Cardiovascular: negative for chest pain, dyspnea on exertion, edema, orthopnea, palpitations, paroxysmal nocturnal dyspnea or shortness of breath Dermatological: negative for rash Respiratory: negative for cough or wheezing Urologic: negative for hematuria Abdominal: negative for nausea, vomiting, diarrhea, bright red blood per rectum, melena, or hematemesis Neurologic: negative for visual changes, syncope, or dizziness All other systems reviewed and are  otherwise negative except as noted above.    Blood pressure 158/90, pulse 67, height 5\' 11"  (1.803 m), weight 159 lb (72.122 kg).  General appearance: alert and no distress Neck: no adenopathy, no carotid bruit, no JVD, supple, symmetrical, trachea midline and thyroid not enlarged, symmetric, no tenderness/mass/nodules Lungs: clear to auscultation bilaterally Heart: regular rate and rhythm, S1, S2 normal, no murmur, click, rub or gallop Extremities: extremities normal, atraumatic, no cyanosis or edema and 22+ pedal pulses bilaterally  EKG normal sinus rhythm at 67 with left axis deviation  ASSESSMENT AND PLAN:   Claudication Patient complains of bilateral calf  claudication with minimal ambulation in his house. He does have 2+ pedal pulses. I'm going to get lower extremity arterial Doppler studies on him.  Coronary artery disease Status post non-ST segment elevation myocardial infarction February 2004 2 with stenting of the circumflex coronary artery with a Cypher drug-eluting stent. His last Myoview performed December of 2000 and it was nonischemic. He denies chest pain or shortness of breath.  Essential hypertension Borderline controlled on current medications  Hyperlipidemia On statin therapy followed by his PCP      Jeremy Gess MD [facility], [facility] 10/21/2012 10:14 AM

## 2012-10-21 NOTE — Assessment & Plan Note (Signed)
On statin therapy followed by his PCP 

## 2012-10-21 NOTE — Assessment & Plan Note (Signed)
Borderline controlled on current medications 

## 2012-11-05 ENCOUNTER — Ambulatory Visit (HOSPITAL_COMMUNITY)
Admission: RE | Admit: 2012-11-05 | Discharge: 2012-11-05 | Disposition: A | Discharge status: Home / Self Care | Payer: Medicare Other | Source: Ambulatory Visit | Attending: Internal Medicine | Admitting: Internal Medicine

## 2012-11-05 DIAGNOSIS — I70219 Atherosclerosis of native arteries of extremities with intermittent claudication, unspecified extremity: Secondary | ICD-10-CM

## 2012-11-05 DIAGNOSIS — I739 Peripheral vascular disease, unspecified: Secondary | ICD-10-CM

## 2012-11-05 NOTE — Progress Notes (Signed)
Arterial Duplex Lower Ext. Completed. Nitin Mckowen, BS, RDMS, RVT  

## 2012-11-10 ENCOUNTER — Encounter: Payer: Self-pay | Admitting: *Deleted

## 2013-02-10 ENCOUNTER — Other Ambulatory Visit: Payer: Self-pay | Admitting: *Deleted

## 2013-02-10 MED ORDER — CLOPIDOGREL BISULFATE 75 MG PO TABS: 75.0000 mg | ORAL_TABLET | Freq: Every day | ORAL | Status: DC

## 2013-02-10 MED ORDER — ISOSORBIDE MONONITRATE ER 30 MG PO TB24: 30.0000 mg | ORAL_TABLET | Freq: Every day | ORAL | Status: DC

## 2013-02-10 MED ORDER — ATORVASTATIN CALCIUM 40 MG PO TABS: 40.0000 mg | ORAL_TABLET | Freq: Every day | ORAL | Status: DC

## 2013-02-10 MED ORDER — METOPROLOL SUCCINATE ER 25 MG PO TB24: 25.0000 mg | ORAL_TABLET | Freq: Every day | ORAL | Status: DC

## 2013-02-13 ENCOUNTER — Other Ambulatory Visit: Payer: Self-pay

## 2013-02-13 NOTE — Telephone Encounter (Signed)
Rx request denied, sent to pharmacy electronically.  Isosorbide has already been filled and patient is currently on Atorvastatin.

## 2013-02-20 ENCOUNTER — Telehealth: Payer: Self-pay | Admitting: Cardiovascular Disease

## 2013-02-20 MED ORDER — METOPROLOL SUCCINATE ER 50 MG PO TB24
50.0000 mg | ORAL_TABLET | Freq: Every day | ORAL | Status: DC
Start: 2013-02-20 — End: 2014-01-20

## 2013-02-20 MED ORDER — SIMVASTATIN 40 MG PO TABS: 40.0000 mg | ORAL_TABLET | Freq: Every day | ORAL | Status: DC

## 2013-02-20 NOTE — Telephone Encounter (Signed)
Please call-question about his medicine. He was taking Metoprolol 50 mg and received in the mail Metoprolol 25 mg. He was takind  Simvastatin and he received Atorvastatin.

## 2013-02-20 NOTE — Telephone Encounter (Signed)
Paper chart reviewed and no documentation found of change from simvastatin to atorvastatin other than it being mentioned in Epic on 2.7.14.  No recent labs.  Discussed w/ Wilburt FinlayBryan Hager, PA-C.  Advised pt be switched back to simvastatin or pt can stay on atorvastatin 40 mg.    Daughter informed and preferred pt to be put back on simvastatin.  Stated pt has never taken atorvastatin and she would like him to take what he has been taking.  Daughter informed script will be sent in for simvastatin 40 mg and metoprolol succinate 50 mg to pharmacy w/ updated instructions.  Verbalized understanding and agreed w/ plan.  Refill(s) sent to pharmacy: [facility].

## 2013-02-20 NOTE — Telephone Encounter (Signed)
Returned call and pt verified x 2 w/ Gavin Poundeborah, pt's daughter.  Stated she has two medications they just received through mail order.  Stated pt was on simvastatin 40 mg and received atorvastatin 40 mg.  Stated pt was on metoprolol succ 50 mg daily and received 25 mg daily.  Daughter informed RN has requested pt's paper chart to review and will call her back.  Informed there may be more information in paper chart to explain the change, but at last OV in Oct. 2014, it does not appear any changes were made to medications.  Verbalized understanding and agreed w/ plan.  Paper chart requested.

## 2013-10-22 ENCOUNTER — Encounter: Payer: Self-pay | Admitting: Cardiovascular Disease

## 2013-10-22 ENCOUNTER — Ambulatory Visit (INDEPENDENT_AMBULATORY_CARE_PROVIDER_SITE_OTHER): Payer: Medicare Other | Admitting: Cardiovascular Disease

## 2013-10-22 VITALS — BP 138/82 | HR 67 | Ht 71.0 in | Wt 167.0 lb

## 2013-10-22 DIAGNOSIS — I10 Essential (primary) hypertension: Secondary | ICD-10-CM

## 2013-10-22 DIAGNOSIS — I2510 Atherosclerotic heart disease of native coronary artery without angina pectoris: Secondary | ICD-10-CM

## 2013-10-22 DIAGNOSIS — I739 Peripheral vascular disease, unspecified: Secondary | ICD-10-CM

## 2013-10-22 DIAGNOSIS — Z79899 Other long term (current) drug therapy: Secondary | ICD-10-CM

## 2013-10-22 DIAGNOSIS — E785 Hyperlipidemia, unspecified: Secondary | ICD-10-CM

## 2013-10-22 NOTE — Assessment & Plan Note (Signed)
Doppler studies performed at our office 11/05/12 were entirely normal except for occluded dorsalis pedis vessels bilaterally

## 2013-10-22 NOTE — Assessment & Plan Note (Signed)
History of CAD status post non-ST segment elevation myocardial infarction February 2004 treat with stenting of the circumflex coronary artery with a Cypher drug-eluting stent. At that time he had a 50% distal RCA lesion a normal LV function. He denies chest pain or shortness of breath.

## 2013-10-22 NOTE — Progress Notes (Signed)
10/22/2013 Jeremy [name]   February 07, 1925  [id number]  Primary Physician Alva GarnetSHELTON,KIMBERLY R., MD Primary Cardiologist: Jeremy GessJonathan J. Khaled Herda MD Jeremy [name],Jeremy [name],Jeremy [name], Jeremy [name]   HPI:  The patient is a very pleasant 80 year old, thin-appearing, married [place] male, father of 4, grandfather to 4 grandchildren who is accompanied by one of his sons today, different from the one that I saw a year ago. He has a history of CAD status post non-ST-segment-elevation myocardial infarction February 2004 treated with stenting of a circumflex using a Cypher drug-eluting stent. At that time, he had a 50% distal RCA lesion and normal LV function. He also had a 40% right renal artery stenosis. He denies chest pain or shortness of breath. Dr. Renae GlossShelton has been following his lipid profile. He was hospitalized a year ago with abdominal pain, was found to have a nonbleeding duodenal ulcer and a small hiatal hernia. His Aleve was stopped. Since I saw him a year ago he denies chest pain or shortness of breath. 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 He also complains of bilateral calf claudication.I did perform a lower extremity arterial Doppler studies a year ago which were normal.    Current Outpatient Prescriptions  Medication Sig Dispense Refill  . aspirin EC 81 MG tablet Take 81 mg by mouth daily.      . clopidogrel (PLAVIX) 75 MG tablet Take 1 tablet (75 mg total) by mouth daily.  90 tablet  2  . dorzolamide (TRUSOPT) 2 % ophthalmic solution Place 1 drop into both eyes daily.       . isosorbide mononitrate (IMDUR) 30 MG 24 hr tablet Take 1 tablet (30 mg total) by mouth daily.  90 tablet  2  . latanoprost (XALATAN) 0.005 % ophthalmic solution Place 1 drop into both eyes at bedtime.      Marland Kitchen. levothyroxine (SYNTHROID, LEVOTHROID) 88 MCG tablet Take 88 mcg by mouth daily.      Marland Kitchen. losartan (COZAAR) 50 MG tablet       . metoprolol succinate (TOPROL-XL) 50 MG 24 hr tablet Take 1 tablet (50 mg  total) by mouth daily.  90 tablet  2  . ranitidine (ZANTAC) 75 MG tablet Take 75 mg by mouth daily.      . simvastatin (ZOCOR) 40 MG tablet Take 1 tablet (40 mg total) by mouth at bedtime.  90 tablet  2  . timolol (TIMOPTIC) 0.5 % ophthalmic solution Place 1 drop into the right eye 2 (two) times daily.        No current facility-administered medications for this visit.    Allergies  Allergen Reactions  . Tramadol Nausea And Vomiting    History   Social History  . Marital Status: Married    Spouse Name: N/A    Number of Children: N/A  . Years of Education: N/A   Occupational History  . Not on file.   Social History Main Topics  . Smoking status: Former Games developermoker  . Smokeless tobacco: Not on file  . Alcohol Use: No  . Drug Use: No  . Sexual Activity:    Other Topics Concern  . Not on file   Social History Narrative  . No narrative on file     Review of Systems: General: negative for chills, fever, night sweats or weight changes.  Cardiovascular: negative for chest pain, dyspnea on exertion, edema, orthopnea, palpitations, paroxysmal nocturnal dyspnea or shortness of breath Dermatological: negative for rash Respiratory: negative for cough or wheezing Urologic: negative  for hematuria Abdominal: negative for nausea, vomiting, diarrhea, bright red blood per rectum, melena, or hematemesis Neurologic: negative for visual changes, syncope, or dizziness All other systems reviewed and are otherwise negative except as noted above.    Blood pressure 138/82, pulse 67, height 5\' 11"  (1.803 m), weight 167 lb (75.751 kg).  General appearance: alert and no distress Neck: no adenopathy, no carotid bruit, no JVD, supple, symmetrical, trachea midline and thyroid not enlarged, symmetric, no tenderness/mass/nodules Lungs: clear to auscultation bilaterally Heart: regular rate and rhythm, S1, S2 normal, no murmur, click, rub or gallop Extremities: extremities normal, atraumatic, no  cyanosis or edema  EKG normal sinus rhythm at 67 without ST or T wave changes  ASSESSMENT AND PLAN:   Coronary artery disease History of CAD status post non-ST segment elevation myocardial infarction February 2004 treat with stenting of the circumflex coronary artery with a Cypher drug-eluting stent. At that time he had a 50% distal RCA lesion a normal LV function. He denies chest pain or shortness of breath.  Essential hypertension Controlled on current medications  Hyperlipidemia On statin therapy followed by his PCP  Claudication Doppler studies performed at our office 11/05/12 were entirely normal except for occluded dorsalis pedis vessels bilaterally      Jeremy GessJonathan J. Peggi Yono MD [facility] CenterFACP,Jeremy [name],Jeremy [name], Doctors HospitalFSCAI 10/22/2013 11:13 AM

## 2013-10-22 NOTE — Assessment & Plan Note (Signed)
On statin therapy followed by his PCP 

## 2013-10-22 NOTE — Assessment & Plan Note (Signed)
Controlled on current medications 

## 2013-10-22 NOTE — Patient Instructions (Signed)
Your physician wants you to follow-up in: 1 Year You will receive a reminder letter in the mail two months in advance. If you don't receive a letter, please call our office to schedule the follow-up appointment.  Your physician recommends that you return for lab work in: Fasting lipid liver

## 2013-10-28 ENCOUNTER — Other Ambulatory Visit: Payer: Self-pay

## 2013-10-28 MED ORDER — CLOPIDOGREL BISULFATE 75 MG PO TABS: 75.0000 mg | ORAL_TABLET | Freq: Every day | ORAL | Status: DC

## 2013-10-28 NOTE — Telephone Encounter (Signed)
Rx was sent to pharmacy electronically. 

## 2013-11-07 ENCOUNTER — Emergency Department (HOSPITAL_COMMUNITY)
Admission: EM | Admit: 2013-11-07 | Discharge: 2013-11-07 | Disposition: A | Discharge status: Home / Self Care | Payer: Medicare Other | Source: Home / Self Care | Attending: Family Medicine | Admitting: Family Medicine

## 2013-11-07 ENCOUNTER — Encounter (HOSPITAL_COMMUNITY): Payer: Self-pay | Admitting: Emergency Medicine

## 2013-11-07 DIAGNOSIS — L03116 Cellulitis of left lower limb: Secondary | ICD-10-CM

## 2013-11-07 DIAGNOSIS — L02416 Cutaneous abscess of left lower limb: Secondary | ICD-10-CM

## 2013-11-07 DIAGNOSIS — Z23 Encounter for immunization: Secondary | ICD-10-CM

## 2013-11-07 MED ORDER — LIDOCAINE-EPINEPHRINE (PF) 2 %-1:200000 IJ SOLN
INTRAMUSCULAR | Status: AC
  Filled 2013-11-07: qty 20

## 2013-11-07 MED ORDER — SULFAMETHOXAZOLE-TRIMETHOPRIM 800-160 MG PO TABS: 2.0000 | ORAL_TABLET | Freq: Two times a day (BID) | ORAL | Status: AC

## 2013-11-07 MED ORDER — TETANUS-DIPHTH-ACELL PERTUSSIS 5-2.5-18.5 LF-MCG/0.5 IM SUSP
0.5000 mL | Freq: Once | INTRAMUSCULAR | Status: AC
  Administered 2013-11-07: 0.5 mL via INTRAMUSCULAR

## 2013-11-07 MED ORDER — TETANUS-DIPHTH-ACELL PERTUSSIS 5-2.5-18.5 LF-MCG/0.5 IM SUSP
INTRAMUSCULAR | Status: AC
  Filled 2013-11-07: qty 0.5

## 2013-11-07 NOTE — Discharge Instructions (Signed)
Abscess °Care After °An abscess (also called a boil or furuncle) is an infected area that contains a collection of pus. Signs and symptoms of an abscess include pain, tenderness, redness, or hardness, or you may feel a moveable soft area under your skin. An abscess can occur anywhere in the body. The infection may spread to surrounding tissues causing cellulitis. A cut (incision) by the surgeon was made over your abscess and the pus was drained out. Gauze may have been packed into the space to provide a drain that will allow the cavity to heal from the inside outwards. The boil may be painful for 5 to 7 days. Most people with a boil do not have high fevers. Your abscess, if seen early, may not have localized, and may not have been lanced. If not, another appointment may be required for this if it does not get better on its own or with medications. °HOME CARE INSTRUCTIONS  °· Only take over-the-counter or prescription medicines for pain, discomfort, or fever as directed by your caregiver. °· When you bathe, soak and then remove gauze or iodoform packs at least daily or as directed by your caregiver. You may then wash the wound gently with mild soapy water. Repack with gauze or do as your caregiver directs. °SEEK IMMEDIATE MEDICAL CARE IF:  °· You develop increased pain, swelling, redness, drainage, or bleeding in the wound site. °· You develop signs of generalized infection including muscle aches, chills, fever, or a general ill feeling. °· An oral temperature above 102° F (38.9° C) develops, not controlled by medication. °See your caregiver for a recheck if you develop any of the symptoms described above. If medications (antibiotics) were prescribed, take them as directed. °Document Released: 07/07/2004 Document Revised: 03/13/2011 Document Reviewed: 03/04/2007 °[employer]® Patient Information ©2015 [employer]. This information is not intended to replace advice given to you by your health care provider. Make sure  you discuss any questions you have with your health care provider. ° °Cellulitis °Cellulitis is an infection of the skin and the tissue beneath it. The infected area is usually red and tender. Cellulitis occurs most often in the arms and lower legs.  °CAUSES  °Cellulitis is caused by bacteria that enter the skin through cracks or cuts in the skin. The most common types of bacteria that cause cellulitis are staphylococci and streptococci. °SIGNS AND SYMPTOMS  °· Redness and warmth. °· Swelling. °· Tenderness or pain. °· Fever. °DIAGNOSIS  °Your health care provider can usually determine what is wrong based on a physical exam. Blood tests may also be done. °TREATMENT  °Treatment usually involves taking an antibiotic medicine. °HOME CARE INSTRUCTIONS  °· Take your antibiotic medicine as directed by your health care provider. Finish the antibiotic even if you start to feel better. °· Keep the infected arm or leg elevated to reduce swelling. °· Apply a warm cloth to the affected area up to 4 times per day to relieve pain. °· Take medicines only as directed by your health care provider. °· Keep all follow-up visits as directed by your health care provider. °SEEK MEDICAL CARE IF:  °· You notice red streaks coming from the infected area. °· Your red area gets larger or turns dark in color. °· Your bone or joint underneath the infected area becomes painful after the skin has healed. °· Your infection returns in the same area or another area. °· You notice a swollen bump in the infected area. °· You develop new symptoms. °· You have   a fever. °SEEK IMMEDIATE MEDICAL CARE IF:  °· You feel very sleepy. °· You develop vomiting or diarrhea. °· You have a general ill feeling (malaise) with muscle aches and pains. °MAKE SURE YOU:  °· Understand these instructions. °· Will watch your condition. °· Will get help right away if you are not doing well or get worse. °Document Released: 09/28/2004 Document Revised: 05/05/2013 Document  Reviewed: 03/06/2011 °[employer]® Patient Information ©2015 [employer]. This information is not intended to replace advice given to you by your health care provider. Make sure you discuss any questions you have with your health care provider. ° °

## 2013-11-07 NOTE — ED Provider Notes (Signed)
CSN: [id number]     Arrival date & time 11/07/13  [id number] History   First MD Initiated Contact with Patient 11/07/13 [phone]     Chief Complaint  Patient presents with  . Mass    left posterior thigh   (Consider location/radiation/quality/duration/timing/severity/associated sxs/prior Treatment) HPI         80 year old male presents complaining of an area of painful swelling on the back of his left thigh. He first noticed this on Tuesday and has gotten progressively worse. It doubled in size from last night and today after he put a warm compress on it. It is painful, worse with walking, and worse with palpation. No systemic symptoms. Denies fever, chills, NVD.denies any injury  Past Medical History  Diagnosis Date  . Bleeding ulcer   . Spinal stenosis   . Thyroid disease   . Hypertension   . Coronary artery disease     non-ST segment elevation myocardial infarction February of 2004 she was stenting of the circumflex using a Cypher drug-eluting stent.  . Hyperlipidemia   . Claudication    Past Surgical History  Procedure Laterality Date  . Coronary angioplasty with stent placement    . Thyroidectomy, partial    . Renal doppler  08/17/2005    Normal evaluation  . Cardiac catheterization  02/25/2002    Proximal L Circumflex 95% lesion, stented w/ 3x18-mm CYPHER stent avoiding the 2 L Marginal branch, jailing the 1st marginal, resulting in reduction of a 90-95% lesion to 0% residual  . Cardiovascular stress test  12/18/2006    EKG negative for ischemia, no significant ischemia demonstrated.  . Transthoracic echocardiogram  03/13/2002    EF >55%, moderate LVH,    History reviewed. No pertinent family history. History  Substance Use Topics  . Smoking status: Former Games developermoker  . Smokeless tobacco: Not on file  . Alcohol Use: No    Review of Systems  Musculoskeletal:       Painful swelling on the back of the left thigh, see history of present illness  All other systems reviewed and are  negative.   Allergies  Tramadol  Home Medications   Prior to Admission medications   Medication Sig Start Date End Date Taking? Authorizing Provider  aspirin EC 81 MG tablet Take 81 mg by mouth daily.   Yes Historical Provider, MD  clopidogrel (PLAVIX) 75 MG tablet Take 1 tablet (75 mg total) by mouth daily. 10/28/13  Yes Runell GessJonathan J Berry, MD  dorzolamide (TRUSOPT) 2 % ophthalmic solution Place 1 drop into both eyes daily.  08/23/12  Yes Historical Provider, MD  isosorbide mononitrate (IMDUR) 30 MG 24 hr tablet Take 1 tablet (30 mg total) by mouth daily. 02/10/13  Yes Runell GessJonathan J Berry, MD  latanoprost (XALATAN) 0.005 % ophthalmic solution Place 1 drop into both eyes at bedtime.   Yes Historical Provider, MD  levothyroxine (SYNTHROID, LEVOTHROID) 88 MCG tablet Take 88 mcg by mouth daily.   Yes Historical Provider, MD  losartan (COZAAR) 50 MG tablet  10/08/12  Yes Historical Provider, MD  metoprolol succinate (TOPROL-XL) 50 MG 24 hr tablet Take 1 tablet (50 mg total) by mouth daily. 02/20/13  Yes Runell GessJonathan J Berry, MD  ranitidine (ZANTAC) 75 MG tablet Take 75 mg by mouth daily.   Yes Historical Provider, MD  simvastatin (ZOCOR) 40 MG tablet Take 1 tablet (40 mg total) by mouth at bedtime. 02/20/13  Yes Runell GessJonathan J Berry, MD  timolol (TIMOPTIC) 0.5 % ophthalmic solution Place 1 drop into the  right eye 2 (two) times daily.  09/18/12  Yes Historical Provider, MD  sulfamethoxazole-trimethoprim (BACTRIM DS,SEPTRA DS) 800-160 MG per tablet Take 2 tablets by mouth 2 (two) times daily. 11/07/13 11/14/13  Adrian BlackwaterZachary H Michaline Kindig, PA-C   BP 147/92 mmHg  Pulse 86  Temp(Src) 98.1 F (36.7 C) (Oral)  Resp 14  SpO2 98% Physical Exam  Constitutional: He is oriented to person, place, and time. He appears well-developed and well-nourished. No distress.  HENT:  Head: Normocephalic.  Pulmonary/Chest: Effort normal. No respiratory distress.  Neurological: He is alert and oriented to person, place, and time. Coordination  normal.  Skin: Skin is warm and dry. No rash noted. He is not diaphoretic. There is erythema (14x12 cm diameter area of erythema, induration, with small central pustule).  Psychiatric: He has a normal mood and affect. Judgment normal.  Nursing note and vitals reviewed.   ED Course  INCISION AND DRAINAGE Date/Time: 11/07/2013 11:11 AM Performed by: Autumn MessingBAKER, Lilit Cinelli, H Authorized by: Bradd CanaryKINDL, Nile D Consent: Verbal consent obtained. Risks and benefits: risks, benefits and alternatives were discussed Consent given by: patient Patient understanding: patient states understanding of the procedure being performed Patient identity confirmed: verbally with patient Time out: Immediately prior to procedure a "time out" was called to verify the correct patient, procedure, equipment, support staff and site/side marked as required. Type: abscess Body area: lower extremity (posterior left thigh) Anesthesia: local infiltration Local anesthetic: lidocaine 2% with epinephrine Anesthetic total: 10 ml Patient sedated: no Risk factor: coagulopathy (on ASA and plavix ) Scalpel size: 11 Incision type: single straight Complexity: complex Drainage: bloody and  purulent Drainage amount: copious Packing material: 1/4 in iodoform gauze Patient tolerance: Patient tolerated the procedure well with no immediate complications   (including critical care time) Labs Review Labs Reviewed  CULTURE, ROUTINE-ABSCESS    Imaging Review No results found.   MDM   1. Abscess of left thigh   2. Cellulitis of left thigh    Incision and drainage, packing placed, will return in 3 days. ED if worsening. Also advised return to emergency department if he is bleeding through his packing more than once per hour or this is still bleeding in a few hours.  TDaP given.  Bactrim DS, 2 tablets twice daily for the abscess and cellulitis. Culture sent. He is afebrile and nontoxic, should do well with outpatient management   Meds  ordered this encounter  Medications  . Tdap (BOOSTRIX) injection 0.5 mL    Sig:   . sulfamethoxazole-trimethoprim (BACTRIM DS,SEPTRA DS) 800-160 MG per tablet    Sig: Take 2 tablets by mouth 2 (two) times daily.    Dispense:  40 tablet    Refill:  0    Order Specific Question:  Supervising Provider    Answer:  Bradd CanaryKINDL, Miroslav D [[id number]]       Graylon GoodZachary H Travis Purk, PA-C 11/07/13 1115

## 2013-11-07 NOTE — ED Notes (Signed)
Pt has a large lump on the back of his left thigh.  Pt states he first noticed it on Tuesday and it has gotten progressively worse since then.  The lump is warm to the touch and is red.

## 2013-11-10 ENCOUNTER — Emergency Department (INDEPENDENT_AMBULATORY_CARE_PROVIDER_SITE_OTHER)
Admission: EM | Admit: 2013-11-10 | Discharge: 2013-11-10 | Disposition: A | Discharge status: Home / Self Care | Payer: Medicare Other | Source: Home / Self Care | Attending: Family Medicine | Admitting: Family Medicine

## 2013-11-10 ENCOUNTER — Encounter (HOSPITAL_COMMUNITY): Payer: Self-pay | Admitting: Emergency Medicine

## 2013-11-10 DIAGNOSIS — Z5189 Encounter for other specified aftercare: Secondary | ICD-10-CM

## 2013-11-10 DIAGNOSIS — Z4800 Encounter for change or removal of nonsurgical wound dressing: Secondary | ICD-10-CM

## 2013-11-10 LAB — CULTURE, ROUTINE-ABSCESS

## 2013-11-10 NOTE — Discharge Instructions (Signed)
Keep clean , watch for infection. May remove packing in 2 days. Return for problems, pus drainage, increased redness or pain. Finish all antibiotic

## 2013-11-10 NOTE — ED Provider Notes (Signed)
CSN: [id number]     Arrival date & time 11/10/13  1059 History   First MD Initiated Contact with Patient 11/10/13 1210     Chief Complaint  Patient presents with  . Follow-up    abscess drained on left posterior thigh.  New abscess on left buttocks   (Consider location/radiation/quality/duration/timing/severity/associated sxs/prior Treatment) HPI Comments: Returns for packing removal and wound chk after I and D of L posterior thigh abscess I and D    Past Medical History  Diagnosis Date  . Bleeding ulcer   . Spinal stenosis   . Thyroid disease   . Hypertension   . Coronary artery disease     non-ST segment elevation myocardial infarction February of 2004 she was stenting of the circumflex using a Cypher drug-eluting stent.  . Hyperlipidemia   . Claudication    Past Surgical History  Procedure Laterality Date  . Coronary angioplasty with stent placement    . Thyroidectomy, partial    . Renal doppler  08/17/2005    Normal evaluation  . Cardiac catheterization  02/25/2002    Proximal L Circumflex 95% lesion, stented w/ 3x18-mm CYPHER stent avoiding the 2 L Marginal branch, jailing the 1st marginal, resulting in reduction of a 90-95% lesion to 0% residual  . Cardiovascular stress test  12/18/2006    EKG negative for ischemia, no significant ischemia demonstrated.  . Transthoracic echocardiogram  03/13/2002    EF >55%, moderate LVH,    History reviewed. No pertinent family history. History  Substance Use Topics  . Smoking status: Former Games developermoker  . Smokeless tobacco: Not on file  . Alcohol Use: No    Review of Systems  Constitutional: Negative.  Negative for fever.  Skin: Positive for wound.    Allergies  Tramadol  Home Medications   Prior to Admission medications   Medication Sig Start Date End Date Taking? Authorizing Provider  aspirin EC 81 MG tablet Take 81 mg by mouth daily.    Historical Provider, MD  clopidogrel (PLAVIX) 75 MG tablet Take 1 tablet (75 mg total)  by mouth daily. 10/28/13   Runell GessJonathan J Berry, MD  dorzolamide (TRUSOPT) 2 % ophthalmic solution Place 1 drop into both eyes daily.  08/23/12   Historical Provider, MD  isosorbide mononitrate (IMDUR) 30 MG 24 hr tablet Take 1 tablet (30 mg total) by mouth daily. 02/10/13   Runell GessJonathan J Berry, MD  latanoprost (XALATAN) 0.005 % ophthalmic solution Place 1 drop into both eyes at bedtime.    Historical Provider, MD  levothyroxine (SYNTHROID, LEVOTHROID) 88 MCG tablet Take 88 mcg by mouth daily.    Historical Provider, MD  losartan (COZAAR) 50 MG tablet  10/08/12   Historical Provider, MD  metoprolol succinate (TOPROL-XL) 50 MG 24 hr tablet Take 1 tablet (50 mg total) by mouth daily. 02/20/13   Runell GessJonathan J Berry, MD  ranitidine (ZANTAC) 75 MG tablet Take 75 mg by mouth daily.    Historical Provider, MD  simvastatin (ZOCOR) 40 MG tablet Take 1 tablet (40 mg total) by mouth at bedtime. 02/20/13   Runell GessJonathan J Berry, MD  sulfamethoxazole-trimethoprim (BACTRIM DS,SEPTRA DS) 800-160 MG per tablet Take 2 tablets by mouth 2 (two) times daily. 11/07/13 11/14/13  Adrian BlackwaterZachary H Baker, PA-C  timolol (TIMOPTIC) 0.5 % ophthalmic solution Place 1 drop into the right eye 2 (two) times daily.  09/18/12   Historical Provider, MD   BP 118/71 mmHg  Pulse 67  Temp(Src) 97 F (36.1 C) (Oral)  SpO2 97% Physical Exam  Constitutional: He appears well-developed and well-nourished. No distress.  Skin: Skin is warm and dry.  The packing was removed. There is no more drainage. There is still induration surrounding the wound. No lymphangitis or erythema.  Vitals reviewed.   ED Course  Wound packing Date/Time: 11/10/2013 12:36 PM Performed by: Phineas RealMABE, Dimitris Shanahan Authorized by: Clementeen GrahamOREY, EVAN, S Consent: Verbal consent obtained. Consent given by: patient Patient understanding: patient states understanding of the procedure being performed Patient identity confirmed: verbally with patient Local anesthesia used: no Patient sedated: no Patient  tolerance: Patient tolerated the procedure well with no immediate complications Comments: The wound opening remains intact. The packing was pulled and then approximately 6 cm of 1/4 inch iodoform gauze was repacked. Dressing was then applied.   (including critical care time) Labs Review Labs Reviewed - No data to display  Imaging Review No results found.   MDM   1. Wound check, abscess    Watch for increased infection. Increased redness, drainage, purulence or other signs of worsening infection. Remove packing in 2 days. Keep the area clean. Keep covered while showering. Patient placed her on dressing over the wound.     Hayden Rasmussenavid Daziyah Cogan, NP 11/10/13 [phone]

## 2013-11-12 ENCOUNTER — Telehealth (HOSPITAL_COMMUNITY): Payer: Self-pay | Admitting: *Deleted

## 2013-11-12 NOTE — ED Notes (Signed)
Abscess culture: Mod. MRSA. I called pt. Pt. verified x 2 and given result. Pt. told he was adequately treated with Bactrim DS and to finish all of medication. [facility] and instructed him to notify his new PCP. Vassie MoselleYork, Geana Walts M 11/12/2013

## 2013-12-01 ENCOUNTER — Other Ambulatory Visit: Payer: Self-pay | Admitting: Cardiovascular Disease

## 2013-12-01 NOTE — Telephone Encounter (Signed)
Rx was sent to pharmacy electronically. 

## 2013-12-09 ENCOUNTER — Other Ambulatory Visit: Payer: Self-pay | Admitting: Cardiovascular Disease

## 2013-12-09 NOTE — Telephone Encounter (Signed)
Rx was sent to pharmacy electronically. 

## 2014-01-20 ENCOUNTER — Other Ambulatory Visit: Payer: Self-pay | Admitting: Cardiovascular Disease

## 2014-01-26 ENCOUNTER — Encounter (HOSPITAL_COMMUNITY): Payer: Self-pay

## 2014-01-26 ENCOUNTER — Emergency Department (HOSPITAL_COMMUNITY): Payer: Medicare Other

## 2014-01-26 ENCOUNTER — Inpatient Hospital Stay (HOSPITAL_COMMUNITY)
Admission: EM | Admit: 2014-01-26 | Discharge: 2014-01-28 | DRG: 312 | Disposition: A | Discharge status: Home / Self Care | Payer: Medicare Other | Attending: Internal Medicine | Admitting: Internal Medicine

## 2014-01-26 DIAGNOSIS — J189 Pneumonia, unspecified organism: Secondary | ICD-10-CM | POA: Diagnosis present

## 2014-01-26 DIAGNOSIS — E860 Dehydration: Secondary | ICD-10-CM | POA: Diagnosis present

## 2014-01-26 DIAGNOSIS — R1110 Vomiting, unspecified: Secondary | ICD-10-CM

## 2014-01-26 DIAGNOSIS — Z23 Encounter for immunization: Secondary | ICD-10-CM

## 2014-01-26 DIAGNOSIS — Z8711 Personal history of peptic ulcer disease: Secondary | ICD-10-CM

## 2014-01-26 DIAGNOSIS — R4020 Unspecified coma: Secondary | ICD-10-CM

## 2014-01-26 DIAGNOSIS — H3530 Unspecified macular degeneration: Secondary | ICD-10-CM | POA: Diagnosis present

## 2014-01-26 DIAGNOSIS — D649 Anemia, unspecified: Secondary | ICD-10-CM | POA: Diagnosis present

## 2014-01-26 DIAGNOSIS — I2510 Atherosclerotic heart disease of native coronary artery without angina pectoris: Secondary | ICD-10-CM | POA: Diagnosis present

## 2014-01-26 DIAGNOSIS — R001 Bradycardia, unspecified: Secondary | ICD-10-CM | POA: Diagnosis present

## 2014-01-26 DIAGNOSIS — I129 Hypertensive chronic kidney disease with stage 1 through stage 4 chronic kidney disease, or unspecified chronic kidney disease: Secondary | ICD-10-CM | POA: Diagnosis present

## 2014-01-26 DIAGNOSIS — Z87891 Personal history of nicotine dependence: Secondary | ICD-10-CM

## 2014-01-26 DIAGNOSIS — D696 Thrombocytopenia, unspecified: Secondary | ICD-10-CM | POA: Diagnosis present

## 2014-01-26 DIAGNOSIS — Z7982 Long term (current) use of aspirin: Secondary | ICD-10-CM

## 2014-01-26 DIAGNOSIS — I10 Essential (primary) hypertension: Secondary | ICD-10-CM

## 2014-01-26 DIAGNOSIS — Z7902 Long term (current) use of antithrombotics/antiplatelets: Secondary | ICD-10-CM

## 2014-01-26 DIAGNOSIS — Z955 Presence of coronary angioplasty implant and graft: Secondary | ICD-10-CM

## 2014-01-26 DIAGNOSIS — N189 Chronic kidney disease, unspecified: Secondary | ICD-10-CM | POA: Diagnosis present

## 2014-01-26 DIAGNOSIS — E785 Hyperlipidemia, unspecified: Secondary | ICD-10-CM | POA: Diagnosis present

## 2014-01-26 DIAGNOSIS — N179 Acute kidney failure, unspecified: Secondary | ICD-10-CM

## 2014-01-26 DIAGNOSIS — I739 Peripheral vascular disease, unspecified: Secondary | ICD-10-CM | POA: Diagnosis present

## 2014-01-26 DIAGNOSIS — R110 Nausea: Secondary | ICD-10-CM

## 2014-01-26 DIAGNOSIS — R112 Nausea with vomiting, unspecified: Secondary | ICD-10-CM

## 2014-01-26 DIAGNOSIS — I252 Old myocardial infarction: Secondary | ICD-10-CM

## 2014-01-26 DIAGNOSIS — E039 Hypothyroidism, unspecified: Secondary | ICD-10-CM | POA: Diagnosis present

## 2014-01-26 DIAGNOSIS — R55 Syncope and collapse: Principal | ICD-10-CM | POA: Diagnosis present

## 2014-01-26 LAB — URINALYSIS, ROUTINE W REFLEX MICROSCOPIC
GLUCOSE, UA: NEGATIVE mg/dL
Hgb urine dipstick: NEGATIVE
KETONES UR: NEGATIVE mg/dL
Nitrite: NEGATIVE
Protein, ur: NEGATIVE mg/dL
SPECIFIC GRAVITY, URINE: 1.021 (ref 1.005–1.030)
UROBILINOGEN UA: 1 mg/dL (ref 0.0–1.0)
pH: 5.5 (ref 5.0–8.0)

## 2014-01-26 LAB — I-STAT TROPONIN, ED: Troponin i, poc: 0.01 ng/mL (ref 0.00–0.08)

## 2014-01-26 LAB — CBC WITH DIFFERENTIAL/PLATELET
Basophils Absolute: 0.1 10*3/uL (ref 0.0–0.1)
Basophils Relative: 1 % (ref 0–1)
Eosinophils Absolute: 0.3 10*3/uL (ref 0.0–0.7)
Eosinophils Relative: 6 % — ABNORMAL HIGH (ref 0–5)
HCT: 42.7 % (ref 39.0–52.0)
Hemoglobin: 13.6 g/dL (ref 13.0–17.0)
LYMPHS ABS: 2.7 10*3/uL (ref 0.7–4.0)
Lymphocytes Relative: 47 % — ABNORMAL HIGH (ref 12–46)
MCH: 29.8 pg (ref 26.0–34.0)
MCHC: 31.9 g/dL (ref 30.0–36.0)
MCV: 93.4 fL (ref 78.0–100.0)
Monocytes Absolute: 0.6 10*3/uL (ref 0.1–1.0)
Monocytes Relative: 10 % (ref 3–12)
NEUTROS PCT: 36 % — AB (ref 43–77)
Neutro Abs: 2 10*3/uL (ref 1.7–7.7)
Platelets: 145 10*3/uL — ABNORMAL LOW (ref 150–400)
RBC: 4.57 MIL/uL (ref 4.22–5.81)
RDW: 16.6 % — AB (ref 11.5–15.5)
WBC: 5.6 10*3/uL (ref 4.0–10.5)

## 2014-01-26 LAB — URINE MICROSCOPIC-ADD ON

## 2014-01-26 LAB — BASIC METABOLIC PANEL
Anion gap: 8 (ref 5–15)
BUN: 16 mg/dL (ref 6–23)
CHLORIDE: 110 mmol/L (ref 96–112)
CO2: 23 mmol/L (ref 19–32)
CREATININE: 1.44 mg/dL — AB (ref 0.50–1.35)
Calcium: 8.8 mg/dL (ref 8.4–10.5)
GFR, EST AFRICAN AMERICAN: 49 mL/min — AB (ref >90)
GFR, EST NON AFRICAN AMERICAN: 42 mL/min — AB (ref >90)
Glucose, Bld: 108 mg/dL — ABNORMAL HIGH (ref 70–99)
Potassium: 3.8 mmol/L (ref 3.5–5.1)
SODIUM: 141 mmol/L (ref 135–145)

## 2014-01-26 MED ORDER — CLOPIDOGREL BISULFATE 75 MG PO TABS
75.0000 mg | ORAL_TABLET | Freq: Every day | ORAL | Status: DC
  Administered 2014-01-27 – 2014-01-28 (×2): 75 mg via ORAL
  Filled 2014-01-26 (×2): qty 1

## 2014-01-26 MED ORDER — ACETAMINOPHEN 325 MG PO TABS: 650.0000 mg | ORAL_TABLET | Freq: Four times a day (QID) | ORAL | Status: DC | PRN

## 2014-01-26 MED ORDER — METOPROLOL SUCCINATE ER 50 MG PO TB24
50.0000 mg | ORAL_TABLET | Freq: Every day | ORAL | Status: DC
  Filled 2014-01-26: qty 1

## 2014-01-26 MED ORDER — LATANOPROST 0.005 % OP SOLN
1.0000 [drp] | Freq: Every day | OPHTHALMIC | Status: DC
  Administered 2014-01-26 – 2014-01-27 (×2): 1 [drp] via OPHTHALMIC
  Filled 2014-01-26: qty 2.5

## 2014-01-26 MED ORDER — SODIUM CHLORIDE 0.9 % IJ SOLN
3.0000 mL | Freq: Two times a day (BID) | INTRAMUSCULAR | Status: DC
  Administered 2014-01-27: 3 mL via INTRAVENOUS

## 2014-01-26 MED ORDER — ASPIRIN EC 81 MG PO TBEC
81.0000 mg | DELAYED_RELEASE_TABLET | Freq: Every day | ORAL | Status: DC
Start: 2014-01-27 — End: 2014-01-28
  Administered 2014-01-27 – 2014-01-28 (×2): 81 mg via ORAL
  Filled 2014-01-26 (×2): qty 1

## 2014-01-26 MED ORDER — LOSARTAN POTASSIUM 50 MG PO TABS
50.0000 mg | ORAL_TABLET | Freq: Every day | ORAL | Status: DC
  Filled 2014-01-26: qty 1

## 2014-01-26 MED ORDER — TIMOLOL MALEATE 0.5 % OP SOLN
1.0000 [drp] | Freq: Two times a day (BID) | OPHTHALMIC | Status: DC
  Administered 2014-01-26 – 2014-01-28 (×4): 1 [drp] via OPHTHALMIC
  Filled 2014-01-26: qty 5

## 2014-01-26 MED ORDER — ONDANSETRON HCL 4 MG/2ML IJ SOLN
4.0000 mg | Freq: Once | INTRAMUSCULAR | Status: AC
  Administered 2014-01-26: 4 mg via INTRAVENOUS
  Filled 2014-01-26: qty 2

## 2014-01-26 MED ORDER — SIMVASTATIN 40 MG PO TABS
40.0000 mg | ORAL_TABLET | Freq: Every day | ORAL | Status: DC
  Administered 2014-01-27: 40 mg via ORAL
  Filled 2014-01-26 (×2): qty 1

## 2014-01-26 MED ORDER — DORZOLAMIDE HCL 2 % OP SOLN
1.0000 [drp] | Freq: Every day | OPHTHALMIC | Status: DC
  Administered 2014-01-27 – 2014-01-28 (×2): 1 [drp] via OPHTHALMIC
  Filled 2014-01-26: qty 10

## 2014-01-26 MED ORDER — SODIUM CHLORIDE 0.9 % IV BOLUS (SEPSIS)
500.0000 mL | Freq: Once | INTRAVENOUS | Status: AC
  Administered 2014-01-26: 500 mL via INTRAVENOUS

## 2014-01-26 MED ORDER — LEVOTHYROXINE SODIUM 88 MCG PO TABS
88.0000 ug | ORAL_TABLET | Freq: Every day | ORAL | Status: DC
  Administered 2014-01-27: 88 ug via ORAL
  Filled 2014-01-26 (×2): qty 1

## 2014-01-26 MED ORDER — SODIUM CHLORIDE 0.9 % IJ SOLN
3.0000 mL | Freq: Two times a day (BID) | INTRAMUSCULAR | Status: DC
  Administered 2014-01-26 – 2014-01-27 (×2): 3 mL via INTRAVENOUS

## 2014-01-26 MED ORDER — FAMOTIDINE 20 MG PO TABS
20.0000 mg | ORAL_TABLET | Freq: Every day | ORAL | Status: DC
  Administered 2014-01-27 – 2014-01-28 (×2): 20 mg via ORAL
  Filled 2014-01-26 (×2): qty 1

## 2014-01-26 MED ORDER — ONDANSETRON HCL 4 MG PO TABS: 4.0000 mg | ORAL_TABLET | Freq: Four times a day (QID) | ORAL | Status: DC | PRN

## 2014-01-26 MED ORDER — ENOXAPARIN SODIUM 40 MG/0.4ML ~~LOC~~ SOLN
40.0000 mg | SUBCUTANEOUS | Status: DC
  Administered 2014-01-27: 40 mg via SUBCUTANEOUS
  Filled 2014-01-26 (×3): qty 0.4

## 2014-01-26 MED ORDER — ACETAMINOPHEN 650 MG RE SUPP: 650.0000 mg | Freq: Four times a day (QID) | RECTAL | Status: DC | PRN

## 2014-01-26 MED ORDER — ONDANSETRON HCL 4 MG/2ML IJ SOLN: 4.0000 mg | Freq: Four times a day (QID) | INTRAMUSCULAR | Status: DC | PRN

## 2014-01-26 MED ORDER — ISOSORBIDE MONONITRATE ER 30 MG PO TB24
30.0000 mg | ORAL_TABLET | Freq: Every day | ORAL | Status: DC
  Administered 2014-01-27 – 2014-01-28 (×2): 30 mg via ORAL
  Filled 2014-01-26 (×2): qty 1

## 2014-01-26 NOTE — ED Provider Notes (Signed)
CSN: [id number]     Arrival date & time 01/26/14  1604 History   First MD Initiated Contact with Patient 01/26/14 1626     Chief Complaint  Patient presents with  . Loss of Consciousness  . Emesis     (Consider location/radiation/quality/duration/timing/severity/associated sxs/prior Treatment) HPI Comments: Patient presents after syncopal episode. He has a history of peptic ulcer disease, hypertension hyperlipidemia and coronary artery disease status post stent placement. He states that he started to get a little headache earlier today and as he was folding some laundry with his wife he became a little bit lightheaded and had a syncopal episode. When he woke up he was vomiting. When EMS arrived they found his heart rate did dip down into the 30s. After he started vomiting again his heart rate went back up to the 120s per EMS report. He denies any chest pain or shortness of breath. He still feels a little nauseated but denies any headache. He had one past episode of syncope which was attributed to urinary tract infection. He is on metoprolol he says he took his regular medicines this morning. He denies any cough fevers diarrhea or other recent illnesses. He denies any speech deficits or vision changes or any numbness or weakness to his extremities.  Patient is a 81 y.o. male presenting with syncope and vomiting.  Loss of Consciousness Associated symptoms: nausea and vomiting   Associated symptoms: no chest pain, no diaphoresis, no dizziness, no fever, no headaches, no shortness of breath and no weakness   Emesis Associated symptoms: no abdominal pain, no arthralgias, no chills, no diarrhea and no headaches     Past Medical History  Diagnosis Date  . Bleeding ulcer   . Spinal stenosis   . Thyroid disease   . Hypertension   . Coronary artery disease     non-ST segment elevation myocardial infarction February of 2004 she was stenting of the circumflex using a Cypher drug-eluting stent.  .  Hyperlipidemia   . Claudication    Past Surgical History  Procedure Laterality Date  . Coronary angioplasty with stent placement    . Thyroidectomy, partial    . Renal doppler  08/17/2005    Normal evaluation  . Cardiac catheterization  02/25/2002    Proximal L Circumflex 95% lesion, stented w/ 3x18-mm CYPHER stent avoiding the 2 L Marginal branch, jailing the 1st marginal, resulting in reduction of a 90-95% lesion to 0% residual  . Cardiovascular stress test  12/18/2006    EKG negative for ischemia, no significant ischemia demonstrated.  . Transthoracic echocardiogram  03/13/2002    EF >55%, moderate LVH,    No family history on file. History  Substance Use Topics  . Smoking status: Former Games developermoker  . Smokeless tobacco: Not on file  . Alcohol Use: No    Review of Systems  Constitutional: Negative for fever, chills, diaphoresis and fatigue.  HENT: Negative for congestion, rhinorrhea and sneezing.   Eyes: Negative.   Respiratory: Negative for cough, chest tightness and shortness of breath.   Cardiovascular: Positive for syncope. Negative for chest pain and leg swelling.  Gastrointestinal: Positive for nausea and vomiting. Negative for abdominal pain, diarrhea and blood in stool.  Genitourinary: Negative for frequency, hematuria, flank pain and difficulty urinating.  Musculoskeletal: Negative for back pain and arthralgias.  Skin: Negative for rash.  Neurological: Positive for syncope and light-headedness. Negative for dizziness, speech difficulty, weakness, numbness and headaches.      Allergies  Tramadol  Home Medications  Prior to Admission medications   Medication Sig Start Date End Date Taking? Authorizing Provider  aspirin EC 81 MG tablet Take 81 mg by mouth daily.    Historical Provider, MD  clopidogrel (PLAVIX) 75 MG tablet Take 1 tablet (75 mg total) by mouth daily. 10/28/13   Runell Gess, MD  dorzolamide (TRUSOPT) 2 % ophthalmic solution Place 1 drop into both  eyes daily.  08/23/12   Historical Provider, MD  isosorbide mononitrate (IMDUR) 30 MG 24 hr tablet Take 1 tablet (30 mg total) by mouth daily. 12/01/13   Runell Gess, MD  latanoprost (XALATAN) 0.005 % ophthalmic solution Place 1 drop into both eyes at bedtime.    Historical Provider, MD  levothyroxine (SYNTHROID, LEVOTHROID) 88 MCG tablet Take 88 mcg by mouth daily.    Historical Provider, MD  losartan (COZAAR) 50 MG tablet  10/08/12   Historical Provider, MD  metoprolol succinate (TOPROL-XL) 50 MG 24 hr tablet TAKE 1 BY MOUTH DAILY 01/20/14   Runell Gess, MD  ranitidine (ZANTAC) 75 MG tablet Take 75 mg by mouth daily.    Historical Provider, MD  simvastatin (ZOCOR) 40 MG tablet TAKE 1 BY MOUTH AT BEDTIME 12/09/13   Runell Gess, MD  timolol (TIMOPTIC) 0.5 % ophthalmic solution Place 1 drop into the right eye 2 (two) times daily.  09/18/12   Historical Provider, MD   BP 156/86 mmHg  Pulse 74  Temp(Src) 97.4 F (36.3 C) (Oral)  Resp 16  SpO2 96% Physical Exam  Constitutional: He is oriented to person, place, and time. He appears well-developed and well-nourished.  HENT:  Head: Normocephalic and atraumatic.  Eyes: Pupils are equal, round, and reactive to light.  Neck: Normal range of motion. Neck supple.  Cardiovascular: Normal rate, regular rhythm and normal heart sounds.   Pulmonary/Chest: Effort normal and breath sounds normal. No respiratory distress. He has no wheezes. He has no rales. He exhibits no tenderness.  Abdominal: Soft. Bowel sounds are normal. There is no tenderness. There is no rebound and no guarding.  Musculoskeletal: Normal range of motion. He exhibits no edema.  Lymphadenopathy:    He has no cervical adenopathy.  Neurological: He is alert and oriented to person, place, and time. He has normal strength. No cranial nerve deficit or sensory deficit. GCS eye subscore is 4. GCS verbal subscore is 5. GCS motor subscore is 6.  Skin: Skin is warm and dry. No rash  noted.  Psychiatric: He has a normal mood and affect.    ED Course  Procedures (including critical care time) Labs Review Labs Reviewed  BASIC METABOLIC PANEL - Abnormal; Notable for the following:    Glucose, Bld 108 (*)    Creatinine, Ser 1.44 (*)    GFR calc non Af Amer 42 (*)    GFR calc Af Amer 49 (*)    All other components within normal limits  CBC WITH DIFFERENTIAL/PLATELET - Abnormal; Notable for the following:    RDW 16.6 (*)    Platelets 145 (*)    Neutrophils Relative % 36 (*)    Lymphocytes Relative 47 (*)    Eosinophils Relative 6 (*)    All other components within normal limits  URINALYSIS, ROUTINE W REFLEX MICROSCOPIC - Abnormal; Notable for the following:    Color, Urine AMBER (*)    APPearance CLOUDY (*)    Bilirubin Urine SMALL (*)    Leukocytes, UA TRACE (*)    All other components within normal limits  URINE MICROSCOPIC-ADD ON - Abnormal; Notable for the following:    Casts HYALINE CASTS (*)    Crystals CA OXALATE CRYSTALS (*)    All other components within normal limits  I-STAT TROPOININ, ED    Imaging Review Dg Chest 2 View  01/26/2014   CLINICAL DATA:  Initial encounter for loss of consciousness. Emesis. Syncopal episode.  EXAM: CHEST  2 VIEW  COMPARISON:  02/09/2012  FINDINGS: Lateral view degraded by patient arm position. Patient rotated minimally right. Midline trachea. Normal heart size. Tortuous thoracic aorta. Right hemidiaphragm elevation which is slightly progressive, moderate. No pleural effusion or pneumothorax. Patchy bibasilar airspace disease. This likely accounts for retrocardiac density on the lateral view.  IMPRESSION: Bibasilar airspace disease, favored to represent atelectasis. Given the extent of retrocardiac density on the lateral view, early infection cannot be entirely excluded (especially on the right). Depending on clinical symptoms, consider short term radiographic followup.   Electronically Signed   By: Jeronimo Greaves M.D.   On:  01/26/2014 17:54     EKG Interpretation   Date/Time:  Monday January 26 2014 16:45:05 EST Ventricular Rate:  61 PR Interval:  184 QRS Duration: 123 QT Interval:  470 QTC Calculation: 473 R Axis:   -52 Text Interpretation:  Sinus rhythm Nonspecific IVCD with LAD Anteroseptal  infarct, old Nonspecific T abnormalities, lateral leads since last tracing  no significant change Confirmed by Antoino Westhoff  MD, Reneka Nebergall ([id number]) on 01/26/2014  5:41:34 PM      MDM   Final diagnoses:  Syncope, unspecified syncope type    I spoke with the nursing staff and they states that in addition to the initial syncopal episode the patient had prior to EMS arrival, the patient while in the ambulance said he felt nauseated and felt like he wasn't feeling right in his heart rate that point drop into the 30s and he had a second syncopal episode. He also started vomiting after that episode. He hasn't had any drops in his heart rate since that time. His troponin is negative. I will consult cardiology.  I spoke with Dr. Anne Fu.  He states cardiology will be happy to consult on the patient but requests the hospitalist to admit the patient. I will consult the hospitalist service.  Spoke with Dr. Toniann Fail who will admit the pt.  Rolan Bucco, MD 01/26/14 [phone]

## 2014-01-26 NOTE — H&P (Addendum)
[facility] History and Physical  Jeremy [name] [name]:[id number] DOB: 1925/10/05 DOA: 01/26/2014  Referring physician: ER physician. PCP: Alva Garnet., MD   Chief Complaint: Loss of consciousness.  HPI: Jeremy [name] is a 81 y.o. male with history of CAD status post stenting, hypertension, hyperlipidemia, hypothyroidism, claudication was brought to the ER after patient had a brief syncopal episode. As per the family patient was sitting at his living room when patient was found to be unresponsive after which any was waken up he had nausea vomiting and EMS was called immediately. EMS recorded a heart rate of 30/m and he was brought to the ER. By the time patient started has come back to around 70 per minute. Patient in the ER the first found to be nonfocal. Denies any chest pain or shortness of breath. Denies any abdominal pain diarrhea. Patient was recently started on gabapentin for lower extremity pain. Patient has had a similar episode couple of years ago and at that time was found to have GI bleed with gastric ulcer. Chest x-ray shows patchy infiltrates with retrocardiac density. Cardiology on-call has been consulted and patient has been admitted for further management.   Review of Systems: As presented in the history of presenting illness, rest negative.  Past Medical History  Diagnosis Date  . Bleeding ulcer   . Spinal stenosis   . Thyroid disease   . Hypertension   . Coronary artery disease     non-ST segment elevation myocardial infarction February of 2004 she was stenting of the circumflex using a Cypher drug-eluting stent.  . Hyperlipidemia   . Claudication    Past Surgical History  Procedure Laterality Date  . Coronary angioplasty with stent placement    . Thyroidectomy, partial    . Renal doppler  08/17/2005    Normal evaluation  . Cardiac catheterization  02/25/2002    Proximal L Circumflex 95% lesion, stented w/ 3x18-mm CYPHER stent avoiding the 2 L Marginal  branch, jailing the 1st marginal, resulting in reduction of a 90-95% lesion to 0% residual  . Cardiovascular stress test  12/18/2006    EKG negative for ischemia, no significant ischemia demonstrated.  . Transthoracic echocardiogram  03/13/2002    EF >55%, moderate LVH,    Social History:  reports that he has quit smoking. He does not have any smokeless tobacco history on file. He reports that he does not drink alcohol or use illicit drugs. Where does patient live home. Can patient participate in ADLs? Yes.  Allergies  Allergen Reactions  . Tramadol Nausea And Vomiting    Family History: History reviewed. No pertinent family history.    Prior to Admission medications   Medication Sig Start Date End Date Taking? Authorizing Provider  aspirin EC 81 MG tablet Take 81 mg by mouth daily.    Historical Provider, MD  clopidogrel (PLAVIX) 75 MG tablet Take 1 tablet (75 mg total) by mouth daily. 10/28/13   Runell Gess, MD  dorzolamide (TRUSOPT) 2 % ophthalmic solution Place 1 drop into both eyes daily.  08/23/12   Historical Provider, MD  isosorbide mononitrate (IMDUR) 30 MG 24 hr tablet Take 1 tablet (30 mg total) by mouth daily. 12/01/13   Runell Gess, MD  latanoprost (XALATAN) 0.005 % ophthalmic solution Place 1 drop into both eyes at bedtime.    Historical Provider, MD  levothyroxine (SYNTHROID, LEVOTHROID) 88 MCG tablet Take 88 mcg by mouth daily.    Historical Provider, MD  losartan (COZAAR) 50 MG tablet  10/08/12   Historical Provider, MD  metoprolol succinate (TOPROL-XL) 50 MG 24 hr tablet TAKE 1 BY MOUTH DAILY 01/20/14   Runell GessJonathan J Berry, MD  ranitidine (ZANTAC) 75 MG tablet Take 75 mg by mouth daily.    Historical Provider, MD  simvastatin (ZOCOR) 40 MG tablet TAKE 1 BY MOUTH AT BEDTIME 12/09/13   Runell GessJonathan J Berry, MD  timolol (TIMOPTIC) 0.5 % ophthalmic solution Place 1 drop into the right eye 2 (two) times daily.  09/18/12   Historical Provider, MD    Physical Exam: Filed  Vitals:   01/26/14 1845 01/26/14 1900 01/26/14 1915 01/26/14 2000  BP: 149/86 153/88 156/86 153/84  Pulse: 73 73 74 79  Temp:      TempSrc:      Resp: 16 13 16 16   SpO2: 94% 95% 96% 96%     General:  Moderately built and nourished.  Eyes: Anicteric no pallor.  ENT: No discharge from the ears eyes nose or mouth.  Neck: No mass felt. No JVD appreciated.  Cardiovascular: S1-S2 heard.  Respiratory: No rhonchi or crepitations.  Abdomen: Soft nontender bowel sounds present.  Skin: No rash.  Musculoskeletal: No edema.  Psychiatric: Appears normal.  Neurologic: Alert awake oriented to time place and person. Moves all extremities.  Labs on Admission:  Basic Metabolic Panel:  Recent Labs Lab 01/26/14 1632  NA 141  K 3.8  CL 110  CO2 23  GLUCOSE 108*  BUN 16  CREATININE 1.44*  CALCIUM 8.8   Liver Function Tests: No results for input(s): AST, ALT, ALKPHOS, BILITOT, PROT, ALBUMIN in the last 168 hours. No results for input(s): LIPASE, AMYLASE in the last 168 hours. No results for input(s): AMMONIA in the last 168 hours. CBC:  Recent Labs Lab 01/26/14 1632  WBC 5.6  NEUTROABS 2.0  HGB 13.6  HCT 42.7  MCV 93.4  PLT 145*   Cardiac Enzymes: No results for input(s): CKTOTAL, CKMB, CKMBINDEX, TROPONINI in the last 168 hours.  BNP (last 3 results) No results for input(s): PROBNP in the last 8760 hours. CBG: No results for input(s): GLUCAP in the last 168 hours.  Radiological Exams on Admission: Dg Chest 2 View  01/26/2014   CLINICAL DATA:  Initial encounter for loss of consciousness. Emesis. Syncopal episode.  EXAM: CHEST  2 VIEW  COMPARISON:  02/09/2012  FINDINGS: Lateral view degraded by patient arm position. Patient rotated minimally right. Midline trachea. Normal heart size. Tortuous thoracic aorta. Right hemidiaphragm elevation which is slightly progressive, moderate. No pleural effusion or pneumothorax. Patchy bibasilar airspace disease. This likely  accounts for retrocardiac density on the lateral view.  IMPRESSION: Bibasilar airspace disease, favored to represent atelectasis. Given the extent of retrocardiac density on the lateral view, early infection cannot be entirely excluded (especially on the right). Depending on clinical symptoms, consider short term radiographic followup.   Electronically Signed   By: Jeronimo GreavesKyle  Talbot M.D.   On: 01/26/2014 17:54    EKG: Independently reviewed. Normal sinus rhythm.  Assessment/Plan Principal Problem:   Syncope Active Problems:   Essential hypertension   Hyperlipidemia   Hypothyroidism   1. Syncope - appreciate cardiology consult. At this time cardiology. This patient may have had a neurocardiogenic syncope. Cardiologist has recommended to decrease metoprolol dose from 50-25 mg by mouth daily and gently hydrated given patient's elevated creatinine from baseline. Check orthostatics. Closely follow in telemetry and check 2-D echo. 2. Abnormal chest x-ray showing bilateral airspace disease - patient is presently asymptomatic but will start  antibiotics if patient becomes febrile or symptomatic. If patient becomes dramatically will get cultures and urine studies for Legionella antigen and strep antigen and influenza PCR. Patient will need follow-up chest x-ray until findings resolves in another 4-6 weeks. 3. Acute on chronic kidney disease - gently hydrate for now and if creatinine does not improve may have to hold Cozaar. 4. Hypertension - see #1 and 3 regarding Cozaar and metoprolol. 5. Hyperlipidemia - continue present medications. 6. Hypothyroidism - on Synthroid. Check TSH. 7. CAD status post stenting - denies any chest pain. Cycle cardiac markers.   Addendum - patient did become febrile later in the night. I have placed patient on Levaquin and ordered blood cultures urine Legionella and strep antigen and influenza PCR.   DVT ProphylaxisLovenox.  Code Status: Full code.  Family Communication:  Family at the bedside.  Disposition Plan: Admitted for observation.    Tionne Carelli N. [facility] Pager [phone].  If 7PM-7AM, please contact night-coverage [email or web address] Password [email or web address] 01/26/2014, 8:36 PM

## 2014-01-26 NOTE — Progress Notes (Signed)
Patient arrived on unit from ED, vital signs stable.  Patient's daughter, Liborio NixonJanice, at bedside.  Patient oriented to unit and to room.  Soft touch call bell placed in room as patient is visually impaired.  Bed alarm on.  Patient and daughter deny any questions or concerns at this time.  Will continue to monitor.

## 2014-01-26 NOTE — ED Notes (Signed)
Per [facility]: pt. Is from home. Pt. Had 2 syncopal LOC today. Pt. HR dropped to 30's, stated "he didn't feel well" and then went unconscious with vomiting. While vomiting pt. HR up to 120's. Pt. Felt ok this AM. Denies pain at this time. Denies CP/SOB. CBG 108. Pt. Hx of htn. Newly placed on gabapentin.

## 2014-01-26 NOTE — Consult Note (Signed)
CARDIOLOGY CONSULT NOTE   Patient ID: Jeremy [name] MRN: [id number] DOB/AGE: 79-Jun-1927 79 y.o.  Admit date: 01/26/2014  Primary Physician   [name], MD Primary Cardiologist  Dr. Allyson [name] Reason for Consultation   Syncope, in the setting of nausea and vomiting, bradycardia  Jeremy [name] is a 81 y.o. male with a history of CAD.  He had a stent to the circumflex in 2004, distal RCA was 50%, treated medically. He was last seen by Dr. Allyson [name] in October 2015 and was doing well. He takes metoprolol XL 50 mg once a day.  Earlier today he was with his granddaughter and began to feel poorly while sitting at the table. Per granddaughter's history, he became unresponsive then had nausea and vomiting. EMS was called. A few years ago, his granddaughter states that he had a similar episode and was found to have peptic ulcer disease. Classic prodrome and post syncopal symptoms such as extreme diaphoresis, pallor, nausea/vomiting as were encountered on this syncopal episode were noted previously.  When EMS arrived, he denied any chest pain or shortness of breath but during an episode of nausea his heart rate dipped down into the 30s. Following his vomiting, his heart rate then increased to the 120s. This is all per EMSs report. He has no focal weakness currently. He denies any recent fevers, cough, diarrheal illnesses, hematemesis.   Past Medical History  Diagnosis Date  . Bleeding ulcer   . Spinal stenosis   . Thyroid disease   . Hypertension   . Coronary artery disease     non-ST segment elevation myocardial infarction February of 2004 she was stenting of the circumflex using a Cypher drug-eluting stent.  . Hyperlipidemia   . Claudication      Past Surgical History  Procedure Laterality Date  . Coronary angioplasty with stent placement    . Thyroidectomy, partial    . Renal doppler  08/17/2005    Normal evaluation  . Cardiac catheterization  02/25/2002    Proximal L  Circumflex 95% lesion, stented w/ 3x18-mm CYPHER stent avoiding the 2 L Marginal branch, jailing the 1st marginal, resulting in reduction of a 90-95% lesion to 0% residual  . Cardiovascular stress test  12/18/2006    EKG negative for ischemia, no significant ischemia demonstrated.  . Transthoracic echocardiogram  03/13/2002    EF >55%, moderate LVH,     Allergies  Allergen Reactions  . Tramadol Nausea And Vomiting    I have reviewed the patient's current medications Prior to Admission medications   Medication Sig Start Date End Date Taking? Authorizing Provider  aspirin EC 81 MG tablet Take 81 mg by mouth daily.    Historical Provider, MD  clopidogrel (PLAVIX) 75 MG tablet Take 1 tablet (75 mg total) by mouth daily. 10/28/13   Runell Gess, MD  dorzolamide (TRUSOPT) 2 % ophthalmic solution Place 1 drop into both eyes daily.  08/23/12   Historical Provider, MD  isosorbide mononitrate (IMDUR) 30 MG 24 hr tablet Take 1 tablet (30 mg total) by mouth daily. 12/01/13   Runell Gess, MD  latanoprost (XALATAN) 0.005 % ophthalmic solution Place 1 drop into both eyes at bedtime.    Historical Provider, MD  levothyroxine (SYNTHROID, LEVOTHROID) 88 MCG tablet Take 88 mcg by mouth daily.    Historical Provider, MD  losartan (COZAAR) 50 MG tablet  10/08/12   Historical Provider, MD  metoprolol succinate (TOPROL-XL) 50 MG 24 hr tablet TAKE 1 BY MOUTH  DAILY 01/20/14   Runell Gess, MD  ranitidine (ZANTAC) 75 MG tablet Take 75 mg by mouth daily.    Historical Provider, MD  simvastatin (ZOCOR) 40 MG tablet TAKE 1 BY MOUTH AT BEDTIME 12/09/13   Runell Gess, MD  timolol (TIMOPTIC) 0.5 % ophthalmic solution Place 1 drop into the right eye 2 (two) times daily.  09/18/12   Historical Provider, MD     History   Social History  . Marital Status: Married    Spouse Name: N/A    Number of Children: N/A  . Years of Education: N/A   Occupational History  . Not on file.   Social History Main  Topics  . Smoking status: Former Games developer  . Smokeless tobacco: Not on file  . Alcohol Use: No  . Drug Use: No  . Sexual Activity: Not on file   Other Topics Concern  . Not on file   Social History Narrative    Family Status  Relation Status Death Age  . Mother Deceased 50  . Father Deceased 56   No early family history of coronary artery disease   ROS:  Full 14 point review of systems complete and found to be negative unless listed above.  Physical Exam: Blood pressure 156/86, pulse 74, temperature 97.4 F (36.3 C), temperature source Oral, resp. rate 16, SpO2 96 %.  General: Well developed, well nourished, male in no acute distress, elderly Head: Eyes PERRLA, No xanthomas.   Normocephalic and atraumatic, oropharynx without edema or exudate. Dentition:  Lungs: Clear to auscultation bilaterally Heart: HRRR S1 S2, no rub/gallop, Heart regular rate and rhythm with S1, S2  murmur. pulses are 2+ extrem.   Neck: No carotid bruits. No lymphadenopathy.  JVD. Abdomen: Bowel sounds present, abdomen soft and non-tender without masses or hernias noted. Msk:  No spine or cva tenderness. No weakness, no joint deformities or effusions. Extremities: No clubbing or cyanosis.  edema.  Neuro: Alert and oriented X 3. No focal deficits noted. Psych:  Good affect, responds appropriately Skin: No rashes or lesions noted.  Labs:   Lab Results  Component Value Date   WBC 5.6 01/26/2014   HGB 13.6 01/26/2014   HCT 42.7 01/26/2014   MCV 93.4 01/26/2014   PLT 145* 01/26/2014     Recent Labs Lab 01/26/14 1632  NA 141  K 3.8  CL 110  CO2 23  BUN 16  CREATININE 1.44*  CALCIUM 8.8  GLUCOSE 108*    Recent Labs  01/26/14 1644  TROPIPOC 0.01   Echo: Pending  ECG:  01/26/14-sinus rhythm, left axis deviation, nonspecific ST-T wave changes, J-point elevation most notable in V2, V3. No significant change from prior EKG. Normal PR interval. Normal QT interval.  Radiology:  Dg Chest 2  View  01/26/2014   CLINICAL DATA:  Initial encounter for loss of consciousness. Emesis. Syncopal episode.  EXAM: CHEST  2 VIEW  COMPARISON:  02/09/2012  FINDINGS: Lateral view degraded by patient arm position. Patient rotated minimally right. Midline trachea. Normal heart size. Tortuous thoracic aorta. Right hemidiaphragm elevation which is slightly progressive, moderate. No pleural effusion or pneumothorax. Patchy bibasilar airspace disease. This likely accounts for retrocardiac density on the lateral view.  IMPRESSION: Bibasilar airspace disease, favored to represent atelectasis. Given the extent of retrocardiac density on the lateral view, early infection cannot be entirely excluded (especially on the right). Depending on clinical symptoms, consider short term radiographic followup.   Electronically Signed   By: Jeronimo Greaves  M.D.   On: 01/26/2014 17:54    ASSESSMENT AND PLAN:     81 year old with coronary artery disease with episode of neurocardiogenic/vasovagal syncope with associated bradycardia and classic symptomatology surrounding nausea and vomiting.  1. Vasovagal/neurocardiogenic syncope  - Observe overnight on telemetry  - Check echocardiogram  - Maintain potassium greater than 4.  - Based upon his symptoms, he likely developed both a vasodepressive and negative chronotropic response to high vagal tone in the setting of vomiting. In these situations, pacemaker is not indicated.  - I do think it would be worthwhile however to decrease his metoprolol in half to 25 mg once a day.  - Check TSH.  - His elevated creatinine of 1.4 with prior creatinine of 0.98 may point towards decreased intravascular volume/dehydration. I would propose gently hydrating him overnight.  - In general, maintaining adequate hydration will be helpful for him to forego future episodes of this occurrence.  2. Nausea/vomiting  - During a previous episode of syncope associated with nausea and vomiting, workup revealed  gastric ulcer. Another episode revealed urologic infection. Perhaps he has an underlying gastritis that may have led to this response.  - Workup per hospitalist team  3. Coronary artery disease  -Appears stable. Troponin normal.  We will follow along with you.  Signed: Donato SchultzSKAINS, MARK, MD

## 2014-01-26 NOTE — ED Notes (Signed)
Dr. Kakrakandy at bedside. 

## 2014-01-27 ENCOUNTER — Encounter (HOSPITAL_COMMUNITY): Payer: Self-pay | Admitting: General Practice

## 2014-01-27 DIAGNOSIS — N179 Acute kidney failure, unspecified: Secondary | ICD-10-CM | POA: Diagnosis not present

## 2014-01-27 DIAGNOSIS — D649 Anemia, unspecified: Secondary | ICD-10-CM | POA: Diagnosis present

## 2014-01-27 DIAGNOSIS — I252 Old myocardial infarction: Secondary | ICD-10-CM | POA: Diagnosis not present

## 2014-01-27 DIAGNOSIS — I129 Hypertensive chronic kidney disease with stage 1 through stage 4 chronic kidney disease, or unspecified chronic kidney disease: Secondary | ICD-10-CM | POA: Diagnosis present

## 2014-01-27 DIAGNOSIS — D696 Thrombocytopenia, unspecified: Secondary | ICD-10-CM | POA: Diagnosis present

## 2014-01-27 DIAGNOSIS — J189 Pneumonia, unspecified organism: Secondary | ICD-10-CM

## 2014-01-27 DIAGNOSIS — Z87891 Personal history of nicotine dependence: Secondary | ICD-10-CM | POA: Diagnosis not present

## 2014-01-27 DIAGNOSIS — E039 Hypothyroidism, unspecified: Secondary | ICD-10-CM | POA: Diagnosis present

## 2014-01-27 DIAGNOSIS — Z23 Encounter for immunization: Secondary | ICD-10-CM | POA: Diagnosis not present

## 2014-01-27 DIAGNOSIS — I739 Peripheral vascular disease, unspecified: Secondary | ICD-10-CM | POA: Diagnosis present

## 2014-01-27 DIAGNOSIS — Z7982 Long term (current) use of aspirin: Secondary | ICD-10-CM | POA: Diagnosis not present

## 2014-01-27 DIAGNOSIS — Z7902 Long term (current) use of antithrombotics/antiplatelets: Secondary | ICD-10-CM | POA: Diagnosis not present

## 2014-01-27 DIAGNOSIS — E785 Hyperlipidemia, unspecified: Secondary | ICD-10-CM

## 2014-01-27 DIAGNOSIS — H3530 Unspecified macular degeneration: Secondary | ICD-10-CM | POA: Diagnosis present

## 2014-01-27 DIAGNOSIS — I2510 Atherosclerotic heart disease of native coronary artery without angina pectoris: Secondary | ICD-10-CM | POA: Diagnosis present

## 2014-01-27 DIAGNOSIS — Z955 Presence of coronary angioplasty implant and graft: Secondary | ICD-10-CM | POA: Diagnosis not present

## 2014-01-27 DIAGNOSIS — R55 Syncope and collapse: Secondary | ICD-10-CM | POA: Diagnosis not present

## 2014-01-27 DIAGNOSIS — Z8711 Personal history of peptic ulcer disease: Secondary | ICD-10-CM | POA: Diagnosis not present

## 2014-01-27 DIAGNOSIS — R001 Bradycardia, unspecified: Secondary | ICD-10-CM | POA: Diagnosis present

## 2014-01-27 DIAGNOSIS — N189 Chronic kidney disease, unspecified: Secondary | ICD-10-CM | POA: Diagnosis present

## 2014-01-27 DIAGNOSIS — E860 Dehydration: Secondary | ICD-10-CM | POA: Diagnosis present

## 2014-01-27 DIAGNOSIS — I25700 Atherosclerosis of coronary artery bypass graft(s), unspecified, with unstable angina pectoris: Secondary | ICD-10-CM

## 2014-01-27 LAB — COMPREHENSIVE METABOLIC PANEL
ALBUMIN: 3.1 g/dL — AB (ref 3.5–5.2)
ALT: 12 U/L (ref 0–53)
AST: 30 U/L (ref 0–37)
Alkaline Phosphatase: 52 U/L (ref 39–117)
Anion gap: 9 (ref 5–15)
BUN: 18 mg/dL (ref 6–23)
CALCIUM: 8.5 mg/dL (ref 8.4–10.5)
CO2: 22 mmol/L (ref 19–32)
Chloride: 108 mmol/L (ref 96–112)
Creatinine, Ser: 1.4 mg/dL — ABNORMAL HIGH (ref 0.50–1.35)
GFR calc Af Amer: 51 mL/min — ABNORMAL LOW (ref >90)
GFR, EST NON AFRICAN AMERICAN: 44 mL/min — AB (ref >90)
Glucose, Bld: 128 mg/dL — ABNORMAL HIGH (ref 70–99)
POTASSIUM: 3.6 mmol/L (ref 3.5–5.1)
Sodium: 139 mmol/L (ref 135–145)
Total Bilirubin: 1.1 mg/dL (ref 0.3–1.2)
Total Protein: 6.3 g/dL (ref 6.0–8.3)

## 2014-01-27 LAB — CBC WITH DIFFERENTIAL/PLATELET
Basophils Absolute: 0 10*3/uL (ref 0.0–0.1)
Basophils Relative: 0 % (ref 0–1)
Eosinophils Absolute: 0.2 10*3/uL (ref 0.0–0.7)
Eosinophils Relative: 2 % (ref 0–5)
HEMATOCRIT: 38.3 % — AB (ref 39.0–52.0)
HEMOGLOBIN: 12.1 g/dL — AB (ref 13.0–17.0)
LYMPHS ABS: 1.5 10*3/uL (ref 0.7–4.0)
Lymphocytes Relative: 15 % (ref 12–46)
MCH: 29 pg (ref 26.0–34.0)
MCHC: 31.6 g/dL (ref 30.0–36.0)
MCV: 91.8 fL (ref 78.0–100.0)
MONO ABS: 0.9 10*3/uL (ref 0.1–1.0)
Monocytes Relative: 9 % (ref 3–12)
NEUTROS ABS: 7.2 10*3/uL (ref 1.7–7.7)
NEUTROS PCT: 74 % (ref 43–77)
Platelets: 131 10*3/uL — ABNORMAL LOW (ref 150–400)
RBC: 4.17 MIL/uL — ABNORMAL LOW (ref 4.22–5.81)
RDW: 16.1 % — ABNORMAL HIGH (ref 11.5–15.5)
WBC: 9.8 10*3/uL (ref 4.0–10.5)

## 2014-01-27 LAB — INFLUENZA PANEL BY PCR (TYPE A & B)
H1N1 flu by pcr: NOT DETECTED
INFLAPCR: NEGATIVE
INFLBPCR: NEGATIVE

## 2014-01-27 LAB — MRSA PCR SCREENING: MRSA by PCR: NEGATIVE

## 2014-01-27 LAB — TSH: TSH: 0.314 u[IU]/mL — ABNORMAL LOW (ref 0.350–4.500)

## 2014-01-27 LAB — TROPONIN I
TROPONIN I: 0.03 ng/mL (ref <0.031)
Troponin I: 0.03 ng/mL (ref <0.031)
Troponin I: <0.03 ng/mL (ref <0.031)

## 2014-01-27 LAB — STREP PNEUMONIAE URINARY ANTIGEN: Strep Pneumo Urinary Antigen: NEGATIVE

## 2014-01-27 MED ORDER — SIMVASTATIN 40 MG PO TABS
40.0000 mg | ORAL_TABLET | Freq: Every day | ORAL | Status: DC
Start: 2014-01-27 — End: 2014-01-27

## 2014-01-27 MED ORDER — PNEUMOCOCCAL VAC POLYVALENT 25 MCG/0.5ML IJ INJ
0.5000 mL | INJECTION | INTRAMUSCULAR | Status: DC
  Filled 2014-01-27: qty 0.5

## 2014-01-27 MED ORDER — LEVOTHYROXINE SODIUM 75 MCG PO TABS: 75.0000 ug | ORAL_TABLET | Freq: Every day | ORAL | Status: DC

## 2014-01-27 MED ORDER — SODIUM CHLORIDE 0.9 % IV SOLN
INTRAVENOUS | Status: AC
  Administered 2014-01-27 – 2014-01-28 (×3): via INTRAVENOUS

## 2014-01-27 MED ORDER — GABAPENTIN 300 MG PO CAPS
300.0000 mg | ORAL_CAPSULE | Freq: Two times a day (BID) | ORAL | Status: DC
  Administered 2014-01-27 – 2014-01-28 (×3): 300 mg via ORAL
  Filled 2014-01-27 (×5): qty 1

## 2014-01-27 MED ORDER — METOPROLOL SUCCINATE ER 25 MG PO TB24
25.0000 mg | ORAL_TABLET | Freq: Every day | ORAL | Status: DC
  Administered 2014-01-27 – 2014-01-28 (×2): 25 mg via ORAL
  Filled 2014-01-27 (×2): qty 1

## 2014-01-27 MED ORDER — LEVOFLOXACIN IN D5W 750 MG/150ML IV SOLN
750.0000 mg | INTRAVENOUS | Status: DC
  Administered 2014-01-27: 750 mg via INTRAVENOUS
  Filled 2014-01-27: qty 150

## 2014-01-27 MED ORDER — LEVOTHYROXINE SODIUM 75 MCG PO TABS
75.0000 ug | ORAL_TABLET | Freq: Every day | ORAL | Status: DC
  Administered 2014-01-28: 75 ug via ORAL
  Filled 2014-01-27 (×2): qty 1

## 2014-01-27 MED ORDER — INFLUENZA VAC SPLIT QUAD 0.5 ML IM SUSY
0.5000 mL | PREFILLED_SYRINGE | INTRAMUSCULAR | Status: AC
  Administered 2014-01-28: 0.5 mL via INTRAMUSCULAR
  Filled 2014-01-27: qty 0.5

## 2014-01-27 NOTE — Progress Notes (Signed)
UR completed 

## 2014-01-27 NOTE — Progress Notes (Signed)
G Code Entry for PT evaluation.   01/27/14 1312  PT G-Codes **NOT FOR INPATIENT CLASS**  Functional Assessment Tool Used clincal judgement  Functional Limitation Mobility: Walking and moving around  Mobility: Walking and Moving Around Current Status (Z6109(G8978) CJ  Mobility: Walking and Moving Around Goal Status (U0454(G8979) CI  Jeremy [name]

## 2014-01-27 NOTE — Progress Notes (Signed)
Patient: Jeremy [name] / Admit Date: 01/26/2014 / Date of Encounter: 01/27/2014, 9:44 AM   Subjective: Feeling good. No complaints. No CP or SOB. No further dizziness. Reports poor oral intake leading up to the event (due to "just lazy"). Not hypoxic, tachypnic or dyspneic. No longer drives.   Objective: Telemetry: tele not currently available - tele is down on 3e, only able to view active rhythm which is NSR/sinus tach Physical Exam: Blood pressure 101/62, pulse 100, temperature 99.1 F (37.3 C), temperature source Oral, resp. rate 16, height 5\' 10"  (1.778 m), weight 159 lb 2.8 oz (72.2 kg), SpO2 96 %. General: Well developed, well nourished WM, in no acute distress. Head: Normocephalic, atraumatic, sclera non-icteric, no xanthomas, nares are without discharge. Neck: Negative for carotid bruits. JVP not elevated. Lungs: Clear bilaterally to auscultation without wheezes, rales, or rhonchi. Breathing is unlabored. Heart: RRR mildly elevated rate S1 S2 without murmurs, rubs, or gallops.  Abdomen: Soft, non-tender, non-distended with normoactive bowel sounds. No rebound/guarding. Extremities: No clubbing or cyanosis. No edema. Distal pedal pulses are 2+ and equal bilaterally. Neuro: Alert and oriented X 3. Moves all extremities spontaneously. Psych:  Responds to questions appropriately with a normal affect.   Intake/Output Summary (Last 24 hours) at 01/27/14 0944 Last data filed at 01/27/14 0826  Gross per 24 hour  Intake    903 ml  Output    150 ml  Net    753 ml    Inpatient Medications:  . aspirin EC  81 mg Oral Daily  . clopidogrel  75 mg Oral Daily  . dorzolamide  1 drop Both Eyes Daily  . enoxaparin (LOVENOX) injection  40 mg Subcutaneous Q24H  . famotidine  20 mg Oral Daily  . [START ON 01/28/2014] Influenza vac split quadrivalent PF  0.5 mL Intramuscular Tomorrow-1000  . isosorbide mononitrate  30 mg Oral Daily  . latanoprost  1 drop Both Eyes QHS  . levofloxacin  (LEVAQUIN) IV  750 mg Intravenous Q48H  . levothyroxine  88 mcg Oral QAC breakfast  . metoprolol succinate  25 mg Oral Daily  . [START ON 01/28/2014] pneumococcal 23 valent vaccine  0.5 mL Intramuscular Tomorrow-1000  . simvastatin  40 mg Oral q1800  . sodium chloride  3 mL Intravenous Q12H  . sodium chloride  3 mL Intravenous Q12H  . timolol  1 drop Right Eye BID   Infusions:  . sodium chloride 100 mL/hr at 01/27/14 0601    Labs:  Recent Labs  01/26/14 1632  NA 141  K 3.8  CL 110  CO2 23  GLUCOSE 108*  BUN 16  CREATININE 1.44*  CALCIUM 8.8     Recent Labs  01/26/14 1632 01/27/14 0855  WBC 5.6 9.8  NEUTROABS 2.0 7.2  HGB 13.6 12.1*  HCT 42.7 38.3*  MCV 93.4 91.8  PLT 145* 131*    Recent Labs  01/26/14 2330 01/27/14 0300  TROPONINI <0.03 0.03   Invalid input(s): POCBNP No results for input(s): HGBA1C in the last 72 hours.   Radiology/Studies:  Dg Chest 2 View  01/26/2014   CLINICAL DATA:  Initial encounter for loss of consciousness. Emesis. Syncopal episode.  EXAM: CHEST  2 VIEW  COMPARISON:  02/09/2012  FINDINGS: Lateral view degraded by patient arm position. Patient rotated minimally right. Midline trachea. Normal heart size. Tortuous thoracic aorta. Right hemidiaphragm elevation which is slightly progressive, moderate. No pleural effusion or pneumothorax. Patchy bibasilar airspace disease. This likely accounts for retrocardiac density on the lateral  view.  IMPRESSION: Bibasilar airspace disease, favored to represent atelectasis. Given the extent of retrocardiac density on the lateral view, early infection cannot be entirely excluded (especially on the right). Depending on clinical symptoms, consider short term radiographic followup.   Electronically Signed   By: Jeronimo Greaves M.D.   On: 01/26/2014 17:54     Assessment and Plan  81 year old with coronary artery disease with episode of neurocardiogenic/vasovagal syncope with associated bradycardia and classic  symptomatology surrounding nausea and vomiting.  1. Vasovagal/neurocardiogenic syncope - Echocardiogram pending - Metoprolol decreased to  daily, Losartan held in setting of decreased HR, BP - Will need tele review once it is available again - Based upon his symptoms, he likely developed both a vasodepressive and negative chronotropic response to high vagal tone in the setting of vomiting. In these situations, pacemaker is not indicated. - His elevated creatinine of 1.4 with prior creatinine of 0.98 may have indicated decreased intravascular volume/dehydration - s/p IVF - would maintain adequate hydration to prevent future episodes. - Prior h/o syncope associated with nausea/vomiting resulted in workup for gastric ulcer. Another episode revealed urologic infection. This admission has possible PNA. Further workup of medical issues below, per IM. - Could consider event monitoring as outpatient  2. Coronary artery disease with remote PCI - stable with negative troponins - EKG with nonspecific ST-T changes - favor conservative observation for anginal sx  3. Acute kidney injury with Cr 1.4, s/p IVF 4. Suppressed TSH 5. Possible CAP with low grade fever 6. Anemia/thrombocytopenia - denies bleeding  Signed, Dayna Dunn PA-C  Agree with findings by Ronie Spies PA-C  See my note  Runell Gess, M.D., [name] [facility] [address] [place], [place]  [address]  [phone] 01/27/2014 2:05 PM

## 2014-01-27 NOTE — Progress Notes (Signed)
Patient ID: Jeremy CoxJames T [name]  male  WUJ:[id number]    DOB: 1925/01/12    DOA: 01/26/2014  PCP: Gwynneth AlimentSANDERS,ROBYN N, MD  Brief history of present illness  Patient is a 81 year old male with CAD, hypertension, lipidemia, hypothyroidism presented with presyncopal episode. Patient was sitting in his living room then was found unresponsive after which he had nausea and vomiting. EMS recorded a heart rate of 30. He denied any chest pain, shortness of breath, any focal neurological deficits, any abdominal pain or diarrhea. Patient was recently started on gabapentin for lower activity pain. He had a similar episode of syncope years ago and was found to have GI bleed with gastric ulcer. Chest x-ray showed patchy infiltrates with retrocardiac density. Pathology was consulted and patient was admitted for further management.  Assessment/Plan: Principal Problem:   Syncope likely vasovagal, surrounding nausea and vomiting, bradycardia - Currently normal sinus rhythm, ruled out for acute ACS - 2-D echo pending, cardiology consulted. - Continue gentle hydration, beta blocker decreased to 25 mg daily  Active Problems:  Community-acquired pneumonia -Continue IV Levaquin, Low-grade fever 100.2, no leukocytosis -  needs repeat chest x-ray in 1-2 weeks to ensure complete resolution of pneumonia and retrocardiac density  AK I on CKD; baseline creatinine unknown - Hold losartan, continue gentle hydration    Essential hypertension/CAD - Currently borderline low, hold losartan, decreased beta blocker - Continue aspirin, Plavix, Imdur    Hyperlipidemia - Continue statin    Hypothyroidism - TSH suppressed at 0.3, Synthroid dose needs to be decreased, I have discontinued the dose of 88 MCG, and continue 75 MCG Synthroid   DVT Prophylaxis:Lovenox  Code Status:Full CODE STATUS  Family Communication:  Disposition:24-48 hours   Consultants: Cardiology   Procedures: 2-D echo pending  Antibiotics:  IV  Levaquin 1/25  Subjective: Patient seen and examined, feeling a whole lot better today, denies any nausea or vomiting and productive cough, having low-grade fever  Objective: Weight change:   Intake/Output Summary (Last 24 hours) at 01/27/14 1059 Last data filed at 01/27/14 0826  Gross per 24 hour  Intake    903 ml  Output    150 ml  Net    753 ml   Blood pressure 106/63, pulse 79, temperature 99.1 F (37.3 C), temperature source Oral, resp. rate 16, height 5\' 10"  (1.778 m), weight 72.2 kg (159 lb 2.8 oz), SpO2 99 %.  Physical Exam: General: Alert and awake, oriented x3, not in any acute distress. CVS: S1-S2 clear, no murmur rubs or gallops Chest: clear to auscultation bilaterally, no wheezing, rales or rhonchi Abdomen: soft nontender, nondistended, normal bowel sounds  Extremities: no cyanosis, clubbing or edema noted bilaterally Neuro: Cranial nerves II-XII intact, no focal neurological deficits  Lab Results: Basic Metabolic Panel:  Recent Labs Lab 01/26/14 1632 01/27/14 0855  NA 141 139  K 3.8 3.6  CL 110 108  CO2 23 22  GLUCOSE 108* 128*  BUN 16 18  CREATININE 1.44* 1.40*  CALCIUM 8.8 8.5   Liver Function Tests:  Recent Labs Lab 01/27/14 0855  AST 30  ALT 12  ALKPHOS 52  BILITOT 1.1  PROT 6.3  ALBUMIN 3.1*   No results for input(s): LIPASE, AMYLASE in the last 168 hours. No results for input(s): AMMONIA in the last 168 hours. CBC:  Recent Labs Lab 01/26/14 1632 01/27/14 0855  WBC 5.6 9.8  NEUTROABS 2.0 7.2  HGB 13.6 12.1*  HCT 42.7 38.3*  MCV 93.4 91.8  PLT 145* 131*  Cardiac Enzymes:  Recent Labs Lab 01/26/14 2330 01/27/14 0300 01/27/14 0855  TROPONINI <0.03 0.03 0.03   BNP: Invalid input(s): POCBNP CBG: No results for input(s): GLUCAP in the last 168 hours.   Micro Results: No results found for this or any previous visit (from the past 240 hour(s)).  Studies/Results: Dg Chest 2 View  01/26/2014   CLINICAL DATA:  Initial  encounter for loss of consciousness. Emesis. Syncopal episode.  EXAM: CHEST  2 VIEW  COMPARISON:  02/09/2012  FINDINGS: Lateral view degraded by patient arm position. Patient rotated minimally right. Midline trachea. Normal heart size. Tortuous thoracic aorta. Right hemidiaphragm elevation which is slightly progressive, moderate. No pleural effusion or pneumothorax. Patchy bibasilar airspace disease. This likely accounts for retrocardiac density on the lateral view.  IMPRESSION: Bibasilar airspace disease, favored to represent atelectasis. Given the extent of retrocardiac density on the lateral view, early infection cannot be entirely excluded (especially on the right). Depending on clinical symptoms, consider short term radiographic followup.   Electronically Signed   By: Jeronimo Greaves M.D.   On: 01/26/2014 17:54    Medications: Scheduled Meds: . aspirin EC  81 mg Oral Daily  . clopidogrel  75 mg Oral Daily  . dorzolamide  1 drop Both Eyes Daily  . enoxaparin (LOVENOX) injection  40 mg Subcutaneous Q24H  . famotidine  20 mg Oral Daily  . gabapentin  300 mg Oral BID  . [START ON 01/28/2014] Influenza vac split quadrivalent PF  0.5 mL Intramuscular Tomorrow-1000  . isosorbide mononitrate  30 mg Oral Daily  . latanoprost  1 drop Both Eyes QHS  . levofloxacin (LEVAQUIN) IV  750 mg Intravenous Q48H  . [START ON 01/28/2014] levothyroxine  75 mcg Oral QAC breakfast  . metoprolol succinate  25 mg Oral Daily  . [START ON 01/28/2014] pneumococcal 23 valent vaccine  0.5 mL Intramuscular Tomorrow-1000  . simvastatin  40 mg Oral q1800  . simvastatin  40 mg Oral Daily  . sodium chloride  3 mL Intravenous Q12H  . sodium chloride  3 mL Intravenous Q12H  . timolol  1 drop Right Eye BID      LOS: 1 day   Markitta Ausburn M.D. [facility] 01/27/2014, 10:59 AM Pager: [phone]  If 7PM-7AM, please contact night-coverage [email or web address] Password [email or web address]

## 2014-01-27 NOTE — Progress Notes (Signed)
MRSA PCR results are negative.  Resulted 01/27/14 at 1052.

## 2014-01-27 NOTE — Progress Notes (Signed)
Dr. Toniann FailKakrakandy made aware of patient's temperature of 100.2.  Patient asymptomatic.  Orders placed by MD for blood cultures.  Patient updated, will continue to monitor.

## 2014-01-27 NOTE — Progress Notes (Signed)
  Echocardiogram 2D Echocardiogram has been performed.  Leta JunglingCooper, Anyelo Mccue M 01/27/2014, 11:43 AM

## 2014-01-27 NOTE — Progress Notes (Addendum)
Patient: Jeremy [name] / Admit Date: 01/26/2014 / Date of Encounter: 01/27/2014, 11:26 AM   Subjective: Feeling good. No complaints. No CP or SOB. No further dizziness. Reports poor oral intake leading up to the event (due to "just lazy"). Not hypoxic, tachypnic or dyspneic. No longer drives.   Objective: Telemetry: episode of bradycardia yesterday in the ER prior to consult note. None since, just NSR. No pauses. Physical Exam: Blood pressure 106/63, pulse 79, temperature 99.1 F (37.3 C), temperature source Oral, resp. rate 16, height  (1.778 m), weight 159 lb 2.8 oz (72.2 kg), SpO2 99 %. General: Well developed, well nourished WM, in no acute distress. Head: Normocephalic, atraumatic, sclera non-icteric, no xanthomas, nares are without discharge. Neck: Negative for carotid bruits. JVP not elevated. Lungs: Clear bilaterally to auscultation without wheezes, rales, or rhonchi. Breathing is unlabored. Heart: RRR mildly elevated rate S1 S2 without murmurs, rubs, or gallops.  Abdomen: Soft, non-tender, non-distended with normoactive bowel sounds. No rebound/guarding. Extremities: No clubbing or cyanosis. No edema. Distal pedal pulses are 2+ and equal bilaterally. Neuro: Alert and oriented X 3. Moves all extremities spontaneously. Psych:  Responds to questions appropriately with a normal affect.   Intake/Output Summary (Last 24 hours) at 01/27/14 1126 Last data filed at 01/27/14 0826  Gross per 24 hour  Intake    903 ml  Output    150 ml  Net    753 ml    Inpatient Medications:  . aspirin EC  81 mg Oral Daily  . clopidogrel  75 mg Oral Daily  . dorzolamide  1 drop Both Eyes Daily  . enoxaparin (LOVENOX) injection  40 mg Subcutaneous Q24H  . famotidine  20 mg Oral Daily  . gabapentin  300 mg Oral BID  . [START ON 01/28/2014] Influenza vac split quadrivalent PF  0.5 mL Intramuscular Tomorrow-1000  . isosorbide mononitrate  30 mg Oral Daily  . latanoprost  1 drop Both Eyes QHS   . levofloxacin (LEVAQUIN) IV  750 mg Intravenous Q48H  . [START ON 01/28/2014] levothyroxine  75 mcg Oral QAC breakfast  . metoprolol succinate  25 mg Oral Daily  . [START ON 01/28/2014] pneumococcal 23 valent vaccine  0.5 mL Intramuscular Tomorrow-1000  . simvastatin  40 mg Oral q1800  . sodium chloride  3 mL Intravenous Q12H  . sodium chloride  3 mL Intravenous Q12H  . timolol  1 drop Right Eye BID   Infusions:  . sodium chloride 100 mL/hr at 01/27/14 0601    Labs:  Recent Labs  01/26/14 1632 01/27/14 0855  NA 141 139  K 3.8 3.6  CL 110 108  CO2 23 22  GLUCOSE 108* 128*  BUN 16 18  CREATININE 1.44* 1.40*  CALCIUM 8.8 8.5     Recent Labs  01/26/14 1632 01/27/14 0855  WBC 5.6 9.8  NEUTROABS 2.0 7.2  HGB 13.6 12.1*  HCT 42.7 38.3*  MCV 93.4 91.8  PLT 145* 131*    Recent Labs  01/26/14 2330 01/27/14 0300 01/27/14 0855  TROPONINI <0.03 0.03 0.03   Invalid input(s): POCBNP No results for input(s): HGBA1C in the last 72 hours.   Radiology/Studies:  Dg Chest 2 View  01/26/2014   CLINICAL DATA:  Initial encounter for loss of consciousness. Emesis. Syncopal episode.  EXAM: CHEST  2 VIEW  COMPARISON:  02/09/2012  FINDINGS: Lateral view degraded by patient arm position. Patient rotated minimally right. Midline trachea. Normal heart size. Tortuous thoracic aorta. Right hemidiaphragm elevation which is  slightly progressive, moderate. No pleural effusion or pneumothorax. Patchy bibasilar airspace disease. This likely accounts for retrocardiac density on the lateral view.  IMPRESSION: Bibasilar airspace disease, favored to represent atelectasis. Given the extent of retrocardiac density on the lateral view, early infection cannot be entirely excluded (especially on the right). Depending on clinical symptoms, consider short term radiographic followup.   Electronically Signed   By: Jeronimo GreavesKyle  Talbot M.D.   On: 01/26/2014 17:54     Assessment and Plan  81 year old with coronary  artery disease with episode of neurocardiogenic/vasovagal syncope with associated bradycardia and classic symptomatology surrounding nausea and vomiting.  1. Vasovagal/neurocardiogenic syncope - Echocardiogram pending - Metoprolol decreased to 25mg  daily, Losartan held in setting of decreased HR, BP - Based upon his symptoms, he likely developed both a vasodepressive and negative chronotropic response to high vagal tone in the setting of vomiting. In these situations, pacemaker is not indicated. - His elevated creatinine of 1.4 with prior creatinine of 0.98 may have indicated decreased intravascular volume/dehydration - s/p IVF - would maintain adequate hydration to prevent future episodes. - Prior h/o syncope associated with nausea/vomiting resulted in workup for gastric ulcer. Another episode revealed urologic infection. This admission has possible PNA. Further workup of medical issues below, per IM. - Could consider event monitoring as outpatient  2. Coronary artery disease with remote PCI - stable with negative troponins - EKG with nonspecific ST-T changes - favor conservative observation for anginal sx  3. Acute kidney injury with Cr 1.4, s/p IVF 4. Suppressed TSH 5. Possible CAP with low grade fever 6. Anemia/thrombocytopenia - denies bleeding  Signed, Dayna Dunn PA-C  Agree with findings by Ronie Spiesayna Dunn PA-C  Pt admitted with witnessed syncope. HR was in 30s when EMS arrived. No CP. Feels fine. Sounds like a vagal episode. Might benefit from loop recorder implantation. Will get EP to see and eval.   Runell GessJonathan J. Zienna Ahlin, M.D., FACP, [facility] HospitalFACC, Kathryne ErikssonFAHA, FSCAI [facility] [address] [place], [place]  [id number]  [phone] 01/27/2014 12:06 PM

## 2014-01-27 NOTE — Progress Notes (Signed)
ANTIBIOTIC CONSULT NOTE - INITIAL  Pharmacy Consult for Levaquin  Indication: rule out pneumonia  Allergies  Allergen Reactions  . Tramadol Nausea And Vomiting    Patient Measurements: Height: 5\' 10"  (177.8 cm) Weight: 158 lb 9.6 oz (71.94 kg) IBW/kg (Calculated) : 73  Vital Signs: Temp: 100.2 F (37.9 C) (01/26 0308) Temp Source: Oral (01/26 0308) BP: 121/74 mmHg (01/26 0308) Pulse Rate: 108 (01/26 0308)  Labs:  Recent Labs  01/26/14 1632  WBC 5.6  HGB 13.6  PLT 145*  CREATININE 1.44*   Estimated Creatinine Clearance: 37.4 mL/min (by C-G formula based on Cr of 1.44).  Medical History: Past Medical History  Diagnosis Date  . Bleeding ulcer   . Spinal stenosis   . Thyroid disease   . Hypertension   . Coronary artery disease     non-ST segment elevation myocardial infarction February of 2004 she was stenting of the circumflex using a Cypher drug-eluting stent.  . Hyperlipidemia   . Claudication    Assessment: Levaquin for possible PNA on CXR. WBC WNL. Mild renal dysfunction.   Plan:  -Levaquin 750 mg IV q48h -Trend WBC, temp, renal function   Jeremy [name], [name] 01/27/2014,5:16 AM

## 2014-01-27 NOTE — Consult Note (Signed)
ELECTROPHYSIOLOGY CONSULT NOTE    Patient ID: Jeremy CoxJames T Stroupe MRN: [id number], DOB/AGE: [id number] 79 y.o.  Admit date: 01/26/2014 Date of Consult: 01/27/2014  Primary Physician: Gwynneth AlimentSANDERS,ROBYN N, MD Primary Cardiologist: Allyson SabalBerry  Reason for Consultation: syncope  HPI:  Jeremy [name] is a 81 y.o. male with a past medical history significant for CAD (s/p NSTEMI and DES to circ 02-2002), hyperlipidemia, hypertension, and hypothyroidism.  He has had 3 episodes of syncope/pre-syncope over the last 4 years.  Each of the episodes occur in the setting of nausea or vomiting.  He has been evaluated in the hospital each time with infection and ulcer found on previous 2 evaluations.    On the day of admission, he had been playing with his grandson. He sat in his chair to rest and awoke on the floor covered in vomit.  EMS was called and he was bradycardic in the 30's per their report.  On arrival to the ER, he had a brief episode of heart rate in the 30's associated with vomiting.  He has felt well since with no other symptoms.    Echocardiogram done today demonstrated EF 55-60%, no RWMA, grade 1 diastolic dysfunction, PA pressure 33.  Last ischemic evaluation 2008 with no ischemia noted.    He denies recent chest pain, shortness of breath, LE edema, fevers, chills, cough, palpitations.  ROS is otherwise negative except as outlined above.  He is limited in his activity by macular degeneration.  He is the primary caregiver for his wife with advanced dementia and has family support from his children.   EP has been asked to evaluate for treatment options.   Past Medical History  Diagnosis Date  . Bleeding ulcer   . Spinal stenosis   . Thyroid disease   . Hypertension   . Coronary artery disease     non-ST segment elevation myocardial infarction February of 2004 she was stenting of the circumflex using a Cypher drug-eluting stent.  . Hyperlipidemia   . Claudication   . Macular degeneration       Surgical History:  Past Surgical History  Procedure Laterality Date  . Coronary angioplasty with stent placement    . Thyroidectomy, partial    . Renal doppler  08/17/2005    Normal evaluation  . Cardiac catheterization  02/25/2002    Proximal L Circumflex 95% lesion, stented w/ 3x18-mm CYPHER stent avoiding the 2 L Marginal branch, jailing the 1st marginal, resulting in reduction of a 90-95% lesion to 0% residual  . Cardiovascular stress test  12/18/2006    EKG negative for ischemia, no significant ischemia demonstrated.  . Transthoracic echocardiogram  03/13/2002    EF >55%, moderate LVH,      Prescriptions prior to admission  Medication Sig Dispense Refill Last Dose  . aspirin EC 81 MG tablet Take 81 mg by mouth daily.   01/26/2014 at Unknown time  . clopidogrel (PLAVIX) 75 MG tablet Take 1 tablet (75 mg total) by mouth daily. 90 tablet 3 01/26/2014 at Unknown time  . dorzolamide (TRUSOPT) 2 % ophthalmic solution Place 1 drop into the right eye every morning.    01/26/2014 at Unknown time  . gabapentin (NEURONTIN) 300 MG capsule Take 300 mg by mouth 2 (two) times daily. Patient supposed to be going on up dose to 300 mg 3 times daily. Patient has been taking 300 mg twice daily for 10 days.   01/26/2014 at Unknown time  . isosorbide mononitrate (IMDUR) 30 MG 24 hr tablet  Take 1 tablet (30 mg total) by mouth daily. 90 tablet 3 01/26/2014 at Unknown time  . latanoprost (XALATAN) 0.005 % ophthalmic solution Place 1 drop into both eyes at bedtime.   01/25/2014 at Unknown time  . levothyroxine (SYNTHROID, LEVOTHROID) 75 MCG tablet Take 75 mcg by mouth daily before breakfast.   01/25/2014 at Unknown time  . losartan (COZAAR) 50 MG tablet    01/26/2014 at Unknown time  . metoprolol succinate (TOPROL-XL) 50 MG 24 hr tablet TAKE 1 BY MOUTH DAILY 90 tablet 2 01/26/2014 at 9a  . ranitidine (ZANTAC) 75 MG tablet Take 75 mg by mouth daily.   01/26/2014 at Unknown time  . simvastatin (ZOCOR) 40 MG tablet Take  40 mg by mouth daily.   01/26/2014 at Unknown time  . timolol (TIMOPTIC) 0.5 % ophthalmic solution Place 1 drop into both eyes 2 (two) times daily. Only at bedtime patient places 1 drop in right eye   01/26/2014 at Unknown time    Inpatient Medications:  . aspirin EC  81 mg Oral Daily  . clopidogrel  75 mg Oral Daily  . dorzolamide  1 drop Both Eyes Daily  . enoxaparin (LOVENOX) injection  40 mg Subcutaneous Q24H  . famotidine  20 mg Oral Daily  . gabapentin  300 mg Oral BID  . [START ON 01/28/2014] Influenza vac split quadrivalent PF  0.5 mL Intramuscular Tomorrow-1000  . isosorbide mononitrate  30 mg Oral Daily  . latanoprost  1 drop Both Eyes QHS  . levofloxacin (LEVAQUIN) IV  750 mg Intravenous Q48H  . [START ON 01/28/2014] levothyroxine  75 mcg Oral QAC breakfast  . metoprolol succinate  25 mg Oral Daily  . [START ON 01/28/2014] pneumococcal 23 valent vaccine  0.5 mL Intramuscular Tomorrow-1000  . simvastatin  40 mg Oral q1800  . sodium chloride  3 mL Intravenous Q12H  . sodium chloride  3 mL Intravenous Q12H  . timolol  1 drop Right Eye BID    Allergies:  Allergies  Allergen Reactions  . Tramadol Nausea And Vomiting    History   Social History  . Marital Status: Married    Spouse Name: N/A    Number of Children: N/A  . Years of Education: N/A   Occupational History  . Not on file.   Social History Main Topics  . Smoking status: Former Games developer  . Smokeless tobacco: Never Used  . Alcohol Use: No  . Drug Use: No  . Sexual Activity: Not on file   Other Topics Concern  . Not on file   Social History Narrative     Family History  Problem Relation Age of Onset  . Family history unknown: Yes    The patient is unsure of his family history.  He is not aware of coronary disease or stroke in the family.   BP 106/63 mmHg  Pulse 79  Temp(Src) 99.1 F (37.3 C) (Oral)  Resp 16  Ht  (1.778 m)  Wt 159 lb 2.8 oz (72.2 kg)  BMI 22.84 kg/m2  SpO2 99% Physical  Exam: Filed Vitals:   01/27/14 1013 01/27/14 1014 01/27/14 1039 01/27/14 1356  BP: 113/66 110/58 106/63 90/54  Pulse: 86 95 79 86  Temp:    97.7 F (36.5 C)  TempSrc:    Oral  Resp:    18  Height:      Weight:      SpO2: 100% 99%  100%    GEN- The patient is elderly appearing,  alert and oriented x 3 today.   Head- normocephalic, atraumatic Eyes-  Sclera clear, conjunctiva pink Ears- hearing intact Oropharynx- clear Neck- supple, Lungs- Clear to ausculation bilaterally, normal work of breathing Heart- Regular rate and rhythm  GI- soft, NT, ND, + BS Extremities- no clubbing, cyanosis, or edema, groin is without hematoma/ bruit MS- no significant deformity or atrophy Skin- no rash or lesion Psych- euthymic mood, full affect Neuro- strength and sensation are intact   Labs:   Lab Results  Component Value Date   WBC 9.8 01/27/2014   HGB 12.1* 01/27/2014   HCT 38.3* 01/27/2014   MCV 91.8 01/27/2014   PLT 131* 01/27/2014     Recent Labs Lab 01/27/14 0855  NA 139  K 3.6  CL 108  CO2 22  BUN 18  CREATININE 1.40*  CALCIUM 8.5  PROT 6.3  BILITOT 1.1  ALKPHOS 52  ALT 12  AST 30  GLUCOSE 128*     Radiology/Studies: Dg Chest 2 View 01/26/2014   CLINICAL DATA:  Initial encounter for loss of consciousness. Emesis. Syncopal episode.  EXAM: CHEST  2 VIEW  COMPARISON:  02/09/2012  FINDINGS: Lateral view degraded by patient arm position. Patient rotated minimally right. Midline trachea. Normal heart size. Tortuous thoracic aorta. Right hemidiaphragm elevation which is slightly progressive, moderate. No pleural effusion or pneumothorax. Patchy bibasilar airspace disease. This likely accounts for retrocardiac density on the lateral view.  IMPRESSION: Bibasilar airspace disease, favored to represent atelectasis. Given the extent of retrocardiac density on the lateral view, early infection cannot be entirely excluded (especially on the right). Depending on clinical symptoms, consider  short term radiographic followup.   Electronically Signed   By: Jeronimo Greaves M.D.   On: 01/26/2014 17:54    VHQ:IONGE rhythm, LAD, non specific ST-T wave changes, no significant change from prior EKG's, normal intervals  TELEMETRY: sinus rhythm with 1 short period of bradycardia in the ER at the time of nausea/vomiting  A/P  1. Vagal syncope The patient had a syncopal episode in the setting of high vagal tone due to N/V.  He has only had syncope in this setting in the past.  EKg and echo are reviewed.  On telemetry, during a vagal episode (vomiting) he had a physiologic response of vagal mediated bradycardia.  There is no indication for pacing or monitoring in this patient.  No further EP workup is planned. Primary team to address nausea should it return (though presently his symptoms are resolved).  OK to discharge from an EP standpoint.  Follow-up with primary care.  Electrophysiology team to see as needed while here. Please call with questions.

## 2014-01-27 NOTE — Evaluation (Signed)
Physical Therapy Evaluation Patient Details Name: Jeremy [name] MRN: [id number] DOB: 1925/09/12 Today's Date: 01/27/2014   History of Present Illness  Admitted after having syncopal episode followed by vomiting episode.  Clinical Impression  Pt admitted with above diagnosis. Pt currently with functional limitations due to the deficits listed below (see PT Problem List). Pt will benefit from skilled PT to increase their independence and safety with mobility to allow discharge to the venue listed below.  Recommend HHPT eval to assess home safety due to pt's decreased vision and living in split-level home.  Will follow acutely and recommend family S upon d/c to home.     Follow Up Recommendations Supervision for mobility/OOB;Home health PT;Supervision/Assistance - 24 hour    Equipment Recommendations  None recommended by PT    Recommendations for Other Services       Precautions / Restrictions Precautions Precautions: Fall      Mobility  Bed Mobility Overal bed mobility: Modified Independent                Transfers Overall transfer level: Needs assistance   Transfers: Sit to/from Stand Sit to Stand: Supervision            Ambulation/Gait Ambulation/Gait assistance: Min guard Ambulation Distance (Feet): 150 Feet Assistive device: None Gait Pattern/deviations: Step-through pattern;Narrow base of support     General Gait Details: Pt with decreased vision, but able to see sheet. Occasional reaching out for tactile cues.  1 small LOB when turning around in room to get to chair.  Stairs            Wheelchair Mobility    Modified Rankin (Stroke Patients Only)       Balance Overall balance assessment: Needs assistance Sitting-balance support: Feet supported;No upper extremity supported Sitting balance-Leahy Scale: Good     Standing balance support: No upper extremity supported Standing balance-Leahy Scale: Fair                              Pertinent Vitals/Pain Pain Assessment: No/denies pain  Supine: 116/65 HR 79 Sitting 108/62 HR 81 Standing 117/77HR 86    Home Living Family/patient expects to be discharged to:: Private residence Living Arrangements: Spouse/significant other Available Help at Discharge: Family;Available 24 hours/day Type of Home: House Home Access: Stairs to enter Entrance Stairs-Rails: Left Entrance Stairs-Number of Steps: 3-4 Home Layout: Multi-level (split level) Home Equipment: Walker - 2 wheels;Cane - single point;Bedside commode;Shower seat      Prior Function Level of Independence: Independent         Comments: lives with wife who has some dementia.  Family checks in on them frequently, but state they think that things are going to have to change and know that they need more S.  Also discussed life alert system.     Hand Dominance        Extremity/Trunk Assessment   Upper Extremity Assessment: Overall WFL for tasks assessed           Lower Extremity Assessment: Overall WFL for tasks assessed      Cervical / Trunk Assessment: Normal  Communication   Communication: No difficulties  Cognition Arousal/Alertness: Awake/alert Behavior During Therapy: WFL for tasks assessed/performed Overall Cognitive Status: Within Functional Limits for tasks assessed                      General Comments General comments (skin integrity, edema, etc.): Discussed home safety with  daughter and son-in-law.  She is stated that things were going to have to change and they are working on figuring out situation for increased S for pt and wife.  MD also in during treatment- no charge for time while he was in room.    Exercises        Assessment/Plan    PT Assessment Patient needs continued PT services  PT Diagnosis Difficulty walking   PT Problem List Decreased activity tolerance;Decreased balance;Decreased mobility;Decreased safety awareness  PT Treatment Interventions Gait  training;Stair training;Functional mobility training;Therapeutic activities;Therapeutic exercise   PT Goals (Current goals can be found in the Care Plan section) Acute Rehab PT Goals Patient Stated Goal: Go home PT Goal Formulation: With patient/family Time For Goal Achievement: 02/10/14 Potential to Achieve Goals: Good    Frequency Min 3X/week   Barriers to discharge        Co-evaluation               End of Session Equipment Utilized During Treatment: Gait belt Activity Tolerance: Patient tolerated treatment well Patient left: in chair;with family/visitor present;with call bell/phone within reach;with chair alarm set           Time: 1145 (no charge for time MD in room)-1225 PT Time Calculation (min) (ACUTE ONLY): 40 min   Charges:   PT Evaluation $Initial PT Evaluation Tier I: 1 Procedure PT Treatments $Gait Training: 8-22 mins   PT G Codes:        Sheliah Fiorillo LUBECK 01/27/2014, 1:14 PM

## 2014-01-27 NOTE — Care Management Note (Signed)
    Page 1 of 1   01/28/2014     11:57:50 AM CARE MANAGEMENT NOTE 01/28/2014  Patient:  Jeremy [name],Jeremy [name]   Account Number:  [id number]  Date Initiated:  01/27/2014  Documentation initiated by:  Argie Applegate  Subjective/Objective Assessment:   Pt adm on 01/26/14 with syncope, bradycardia, AKI.  PTA, pt resides at home with spouse.     Action/Plan:   PT recommending HHPT at dc.  Will follow as pt progresses.   Anticipated DC Date:  01/28/2014   Anticipated DC Plan:  HOME W HOME HEALTH SERVICES      DC Planning Services  CM consult      [facility]   Choice offered to / List presented to:  C-1 Patient        HH arranged  HH-1 RN  HH-2 PT  HH-3 OT      [facility]  [facility]   Status of service:  Completed, signed off Medicare Important Message given?  NA - LOS <3 / Initial given by admissions (If response is "NO", the following Medicare IM given date fields will be blank) Date Medicare IM given:   Medicare IM given by:   Date Additional Medicare IM given:   Additional Medicare IM given by:    Discharge Disposition:  HOME W HOME HEALTH SERVICES  Per UR Regulation:  Reviewed for med. necessity/level of care/duration of stay  If discussed at Long Length of Stay Meetings, dates discussed:    Comments:  01/28/14 Sidney AceJulie Kaizer Dissinger, RN, BSN  [phone] Pt for dc home today; will need HH follow up.  [facility]  Start of care 24-48h post dc date.  No DME needs.

## 2014-01-28 DIAGNOSIS — R55 Syncope and collapse: Secondary | ICD-10-CM | POA: Diagnosis not present

## 2014-01-28 LAB — BASIC METABOLIC PANEL
ANION GAP: 8 (ref 5–15)
BUN: 14 mg/dL (ref 6–23)
CO2: 21 mmol/L (ref 19–32)
Calcium: 8.1 mg/dL — ABNORMAL LOW (ref 8.4–10.5)
Chloride: 110 mmol/L (ref 96–112)
Creatinine, Ser: 1.01 mg/dL (ref 0.50–1.35)
GFR calc Af Amer: 76 mL/min — ABNORMAL LOW (ref >90)
GFR calc non Af Amer: 65 mL/min — ABNORMAL LOW (ref >90)
Glucose, Bld: 94 mg/dL (ref 70–99)
Potassium: 3.3 mmol/L — ABNORMAL LOW (ref 3.5–5.1)
SODIUM: 139 mmol/L (ref 135–145)

## 2014-01-28 LAB — CBC
HCT: 37.2 % — ABNORMAL LOW (ref 39.0–52.0)
Hemoglobin: 12.2 g/dL — ABNORMAL LOW (ref 13.0–17.0)
MCH: 29.9 pg (ref 26.0–34.0)
MCHC: 32.8 g/dL (ref 30.0–36.0)
MCV: 91.2 fL (ref 78.0–100.0)
PLATELETS: 123 10*3/uL — AB (ref 150–400)
RBC: 4.08 MIL/uL — ABNORMAL LOW (ref 4.22–5.81)
RDW: 16 % — ABNORMAL HIGH (ref 11.5–15.5)
WBC: 10.6 10*3/uL — ABNORMAL HIGH (ref 4.0–10.5)

## 2014-01-28 LAB — LEGIONELLA ANTIGEN, URINE

## 2014-01-28 MED ORDER — LEVOFLOXACIN 750 MG PO TABS
750.0000 mg | ORAL_TABLET | Freq: Every day | ORAL | Status: DC
  Administered 2014-01-28: 750 mg via ORAL
  Filled 2014-01-28: qty 1

## 2014-01-28 MED ORDER — POTASSIUM CHLORIDE CRYS ER 20 MEQ PO TBCR
40.0000 meq | EXTENDED_RELEASE_TABLET | Freq: Once | ORAL | Status: AC
  Administered 2014-01-28: 40 meq via ORAL
  Filled 2014-01-28: qty 2

## 2014-01-28 MED ORDER — ONDANSETRON 4 MG PO TBDP: 4.0000 mg | ORAL_TABLET | Freq: Three times a day (TID) | ORAL | Status: DC | PRN

## 2014-01-28 MED ORDER — LEVOFLOXACIN 750 MG PO TABS
750.0000 mg | ORAL_TABLET | Freq: Every day | ORAL | Status: DC
Start: 2014-01-28 — End: 2014-10-11

## 2014-01-28 MED ORDER — METOPROLOL SUCCINATE ER 25 MG PO TB24: 25.0000 mg | ORAL_TABLET | Freq: Every day | ORAL | Status: DC

## 2014-01-28 NOTE — Plan of Care (Signed)
Problem: Phase I Progression Outcomes Goal: OOB as tolerated unless otherwise ordered Outcome: Completed/Met Date Met:  01/28/14 Patient out of bed with standby assist.

## 2014-01-28 NOTE — Discharge Summary (Signed)
Physician Discharge Summary  Patient ID: Jeremy [name] MRN: [id number] DOB/AGE: December 20, 1925 81 y.o.  Admit date: 01/26/2014 Discharge date: 01/28/2014  Primary Care Physician:  [name], MD  Discharge Diagnoses:    . Syncope Bradycardia  . Essential hypertension . Hyperlipidemia . Hypothyroidism . CAP (community acquired pneumonia)  Consults:  Cardiology, Dr. Allyson Sabal, Electrophysiology, Dr. Johney Frame   Recommendations for Outpatient Follow-up:  Chest x-ray had shown bibasilar airspace disease favored to be atelectasis, given the extent of retrocardiac density on lateral view only infection cannot be entirely excluded. Patient will need a chest x-ray in 2 weeks to ensure complete resolution of pneumonia and further evaluation of the retrocardiac density  TESTS THAT NEED FOLLOW-UP Chest x-ray   DIET: Heart healthy    Allergies:   Allergies  Allergen Reactions  . Tramadol Nausea And Vomiting     Discharge Medications:   Medication List    TAKE these medications        aspirin EC 81 MG tablet  Take 81 mg by mouth daily.     clopidogrel 75 MG tablet  Commonly known as:  PLAVIX  Take 1 tablet (75 mg total) by mouth daily.     dorzolamide 2 % ophthalmic solution  Commonly known as:  TRUSOPT  Place 1 drop into the right eye every morning.     gabapentin 300 MG capsule  Commonly known as:  NEURONTIN  Take 300 mg by mouth 2 (two) times daily. Patient supposed to be going on up dose to 300 mg 3 times daily. Patient has been taking 300 mg twice daily for 10 days.     isosorbide mononitrate 30 MG 24 hr tablet  Commonly known as:  IMDUR  Take 1 tablet (30 mg total) by mouth daily.     latanoprost 0.005 % ophthalmic solution  Commonly known as:  XALATAN  Place 1 drop into both eyes at bedtime.     levofloxacin 750 MG tablet  Commonly known as:  LEVAQUIN  Take 1 tablet (750 mg total) by mouth daily. X 5 more days     levothyroxine 75 MCG tablet  Commonly  known as:  SYNTHROID, LEVOTHROID  Take 75 mcg by mouth daily before breakfast.     losartan 50 MG tablet  Commonly known as:  COZAAR     metoprolol succinate 25 MG 24 hr tablet  Commonly known as:  TOPROL-XL  Take 1 tablet (25 mg total) by mouth daily. Take with or immediately following a meal.     ondansetron 4 MG disintegrating tablet  Commonly known as:  ZOFRAN ODT  Take 1 tablet (4 mg total) by mouth every 8 (eight) hours as needed for nausea or vomiting.     ranitidine 75 MG tablet  Commonly known as:  ZANTAC  Take 75 mg by mouth daily.     simvastatin 40 MG tablet  Commonly known as:  ZOCOR  Take 40 mg by mouth daily.     timolol 0.5 % ophthalmic solution  Commonly known as:  TIMOPTIC  Place 1 drop into both eyes 2 (two) times daily. Only at bedtime patient places 1 drop in right eye         Brief H and P: For complete details please refer to admission H and P, but in brief Patient is a 81 year old male with CAD, hypertension, lipidemia, hypothyroidism presented with presyncopal episode. Patient was sitting in his living room then was found unresponsive after which he had nausea and vomiting. EMS  recorded a heart rate of 30. He denied any chest pain, shortness of breath, any focal neurological deficits, any abdominal pain or diarrhea. Patient was recently started on gabapentin for lower activity pain. He had a similar episode of syncope years ago and was found to have GI bleed with gastric ulcer. Chest x-ray showed patchy infiltrates with retrocardiac density. Pathology was consulted and patient was admitted for further management.   Hospital Course:   Syncope likely vasovagal, surrounding nausea and vomiting, bradycardia Patient remained in normal sinus rhythm during hospitalization on telemetry. He was ruled out for acute ACS. Cardiology was consulted, 2-D echo was done. 2-D echo showed EF of 55-60% with grade 1 diastolic dysfunction, no regional wall motion  abnormalities. Beta blocker was decreased to 25 mg daily. Electrophysiology cardiology was also consulted. Patient was seen by Dr Johney Frame who felt that patient had a syncopal episode in the setting of high vagal tone due to nausea and vomiting. There is no indication for pacing or monitoring in this patient. No further EP workup is planned. As the patient's symptoms has resolved, okay to discharge.   Community-acquired pneumonia Patient had low-grade fever at the time of admission 100.2 but no leukocytosis. Chest x-ray was done which showed bibasilar atelectasis versus infiltrates. He was placed on IV Levaquin. Please repeat chest x-ray in 1-2 weeks to ensure complete resolution of pneumonia and evaluation of retrocardiac density. Patient was transitioned to oral antibiotics at the time of discharge.  AK I on CKD; baseline creatinine unknown Creatinine was 1.4 at the time of admission, losartan was held and patient was gently hydrated with IV fluids, creatinine is 1.0 at the time of discharge.   Essential hypertension/CAD BP currently stable, continue aspirin, Plavix, Imdur, restarted losartan. Metoprolol was decreased.   Hyperlipidemia - Continue statin   Hypothyroidism Continue Synthroid, TSH is 0.3, defer to PCP if dose needs to be decreased a little.  Day of Discharge BP 131/83 mmHg  Pulse 95  Temp(Src) 99.1 F (37.3 C) (Oral)  Resp 18  Ht  (1.778 m)  Wt 74.934 kg (165 lb 3.2 oz)  BMI 23.70 kg/m2  SpO2 99%  Physical Exam: General: Alert and awake oriented x3 not in any acute distress. CVS: S1-S2 clear no murmur rubs or gallops Chest: clear to auscultation bilaterally, no wheezing rales or rhonchi Abdomen: soft nontender, nondistended, normal bowel sounds Extremities: no cyanosis, clubbing or edema noted bilaterally Neuro: Cranial nerves II-XII intact, no focal neurological deficits   The results of significant diagnostics from this hospitalization (including  imaging, microbiology, ancillary and laboratory) are listed below for reference.    LAB RESULTS: Basic Metabolic Panel:  Recent Labs Lab 01/27/14 0855 01/28/14 0405  NA 139 139  K 3.6 3.3*  CL 108 110  CO2 22 21  GLUCOSE 128* 94  BUN 18 14  CREATININE 1.40* 1.01  CALCIUM 8.5 8.1*   Liver Function Tests:  Recent Labs Lab 01/27/14 0855  AST 30  ALT 12  ALKPHOS 52  BILITOT 1.1  PROT 6.3  ALBUMIN 3.1*   No results for input(s): LIPASE, AMYLASE in the last 168 hours. No results for input(s): AMMONIA in the last 168 hours. CBC:  Recent Labs Lab 01/27/14 0855 01/28/14 0405  WBC 9.8 10.6*  NEUTROABS 7.2  --   HGB 12.1* 12.2*  HCT 38.3* 37.2*  MCV 91.8 91.2  PLT 131* 123*   Cardiac Enzymes:  Recent Labs Lab 01/27/14 0300 01/27/14 0855  TROPONINI 0.03 0.03  BNP: Invalid input(s): POCBNP CBG: No results for input(s): GLUCAP in the last 168 hours.  Significant Diagnostic Studies:  Dg Chest 2 View  01/26/2014   CLINICAL DATA:  Initial encounter for loss of consciousness. Emesis. Syncopal episode.  EXAM: CHEST  2 VIEW  COMPARISON:  02/09/2012  FINDINGS: Lateral view degraded by patient arm position. Patient rotated minimally right. Midline trachea. Normal heart size. Tortuous thoracic aorta. Right hemidiaphragm elevation which is slightly progressive, moderate. No pleural effusion or pneumothorax. Patchy bibasilar airspace disease. This likely accounts for retrocardiac density on the lateral view.  IMPRESSION: Bibasilar airspace disease, favored to represent atelectasis. Given the extent of retrocardiac density on the lateral view, early infection cannot be entirely excluded (especially on the right). Depending on clinical symptoms, consider short term radiographic followup.   Electronically Signed   By: Jeronimo GreavesKyle  Talbot M.D.   On: 01/26/2014 17:54    2D ECHO: Study Conclusions  - Left ventricle: The cavity size was normal. Wall thickness was normal. Systolic  function was normal. The estimated ejection fraction was in the range of 55% to 60%. Wall motion was normal; there were no regional wall motion abnormalities. Doppler parameters are consistent with abnormal left ventricular relaxation (grade 1 diastolic dysfunction). - Aortic valve: There was trivial regurgitation. - Atrial septum: There was redundancy of the septum, with borderline criteria for aneurysm. - Pulmonary arteries: Systolic pressure was mildly increased. PA peak pressure: 33 mm Hg (S).  Impressions:  - Normal LV function; grade 1 diastolic dysfunction; trace MR/AI/TR; mildly elevated pulmonary pressure.  Disposition and Follow-up:     Discharge Instructions    Diet - low sodium heart healthy    Complete by:  As directed      Increase activity slowly    Complete by:  As directed             DISPOSITION: home with home health PT, OT, RN   DISCHARGE FOLLOW-UP Follow-up Information    Follow up with Gwynneth AlimentSANDERS,ROBYN N, MD On 02/11/2014.   Specialty:  Internal Medicine   Why:   Wednesday @11 :30 AM for hospital follow-up, you need repeat chest xray to ensure clearing of pneumonia   Contact information:   [address] [place] [address] [phone]        Time spent on Discharge: 35 minutes  Signed:   RAI,RIPUDEEP M.D. [facility] 01/28/2014, 12:09 PM Pager: [phone]

## 2014-01-28 NOTE — Progress Notes (Signed)
Pt discharged to home per MD order. Pt and grandson received and reviewed all discharge instructions and medication information including follow-up appointments and prescription information. Pt and grandson verbalized understanding. Pt alert and oriented at discharge with no complaints of pain. Pt IV and telemetry box removed prior to discharge. Pt escorted to private vehicle via wheelchair by guest services volunteer. Joylene GrapesMonge, Derrin Currey C

## 2014-01-29 ENCOUNTER — Telehealth: Payer: Self-pay | Admitting: Cardiovascular Disease

## 2014-01-29 NOTE — Telephone Encounter (Signed)
Patient Metoprolol have been changed from 50 mg to 25 mg.Her question is since he has so many 50 mg left,can they be cut in half?

## 2014-01-29 NOTE — Telephone Encounter (Signed)
Returned call to patient's wife.She stated she just ordered metoprolol 50 mg tablets.Advised ok to cut tablet in half to take 25 mg daily.She stated she will call back to order 25 mg tablet.

## 2014-02-02 LAB — CULTURE, BLOOD (ROUTINE X 2)
Culture: NO GROWTH
Culture: NO GROWTH

## 2014-04-03 ENCOUNTER — Telehealth: Payer: Self-pay | Admitting: Cardiovascular Disease

## 2014-04-03 NOTE — Telephone Encounter (Signed)
Pt has tooth extraction Tuesday, per dentist instructions, needs to come off Plavix x2-3 days prior.  Advised caller I will defer decision to physician & f/u later.

## 2014-04-03 NOTE — Telephone Encounter (Signed)
Pt is going to have dental work,he is on Plavix. She wants to know if he can stop his Plavix?

## 2014-04-03 NOTE — Telephone Encounter (Signed)
Dr. Tresa EndoKelly reviewed, [facility] but ideally d/c Plavix today to reduce bleed risk. Instructed to resume Plavix day following extraction. Caller voiced understanding.

## 2014-07-31 ENCOUNTER — Other Ambulatory Visit: Payer: Self-pay | Admitting: Cardiovascular Disease

## 2014-07-31 NOTE — Telephone Encounter (Signed)
Rx(s) sent to pharmacy electronically.  

## 2014-10-09 ENCOUNTER — Ambulatory Visit: Payer: Medicare Other | Admitting: Cardiovascular Disease

## 2014-10-11 ENCOUNTER — Encounter (HOSPITAL_COMMUNITY): Payer: Self-pay | Admitting: *Deleted

## 2014-10-11 ENCOUNTER — Emergency Department (HOSPITAL_COMMUNITY): Payer: Medicare Other

## 2014-10-11 ENCOUNTER — Observation Stay (HOSPITAL_COMMUNITY)
Admission: EM | Admit: 2014-10-11 | Discharge: 2014-10-13 | Disposition: A | Discharge status: Home / Self Care | Payer: Medicare Other | Attending: Internal Medicine | Admitting: Internal Medicine

## 2014-10-11 DIAGNOSIS — E890 Postprocedural hypothyroidism: Secondary | ICD-10-CM | POA: Diagnosis not present

## 2014-10-11 DIAGNOSIS — R339 Retention of urine, unspecified: Secondary | ICD-10-CM | POA: Insufficient documentation

## 2014-10-11 DIAGNOSIS — G8929 Other chronic pain: Secondary | ICD-10-CM | POA: Insufficient documentation

## 2014-10-11 DIAGNOSIS — I10 Essential (primary) hypertension: Secondary | ICD-10-CM | POA: Insufficient documentation

## 2014-10-11 DIAGNOSIS — M4800 Spinal stenosis, site unspecified: Secondary | ICD-10-CM

## 2014-10-11 DIAGNOSIS — Z7982 Long term (current) use of aspirin: Secondary | ICD-10-CM | POA: Diagnosis not present

## 2014-10-11 DIAGNOSIS — H3530 Unspecified macular degeneration: Secondary | ICD-10-CM | POA: Insufficient documentation

## 2014-10-11 DIAGNOSIS — M79651 Pain in right thigh: Secondary | ICD-10-CM | POA: Diagnosis not present

## 2014-10-11 DIAGNOSIS — Z87891 Personal history of nicotine dependence: Secondary | ICD-10-CM | POA: Diagnosis not present

## 2014-10-11 DIAGNOSIS — Z9181 History of falling: Secondary | ICD-10-CM | POA: Diagnosis not present

## 2014-10-11 DIAGNOSIS — Z792 Long term (current) use of antibiotics: Secondary | ICD-10-CM | POA: Insufficient documentation

## 2014-10-11 DIAGNOSIS — Z955 Presence of coronary angioplasty implant and graft: Secondary | ICD-10-CM | POA: Insufficient documentation

## 2014-10-11 DIAGNOSIS — Z7902 Long term (current) use of antithrombotics/antiplatelets: Secondary | ICD-10-CM | POA: Diagnosis not present

## 2014-10-11 DIAGNOSIS — Z79899 Other long term (current) drug therapy: Secondary | ICD-10-CM | POA: Insufficient documentation

## 2014-10-11 DIAGNOSIS — R112 Nausea with vomiting, unspecified: Secondary | ICD-10-CM

## 2014-10-11 DIAGNOSIS — I252 Old myocardial infarction: Secondary | ICD-10-CM | POA: Insufficient documentation

## 2014-10-11 DIAGNOSIS — E039 Hypothyroidism, unspecified: Secondary | ICD-10-CM | POA: Diagnosis not present

## 2014-10-11 DIAGNOSIS — E785 Hyperlipidemia, unspecified: Secondary | ICD-10-CM | POA: Insufficient documentation

## 2014-10-11 DIAGNOSIS — M4806 Spinal stenosis, lumbar region: Secondary | ICD-10-CM | POA: Diagnosis not present

## 2014-10-11 DIAGNOSIS — M25551 Pain in right hip: Principal | ICD-10-CM | POA: Insufficient documentation

## 2014-10-11 DIAGNOSIS — I2510 Atherosclerotic heart disease of native coronary artery without angina pectoris: Secondary | ICD-10-CM | POA: Diagnosis not present

## 2014-10-11 LAB — CBC WITH DIFFERENTIAL/PLATELET
BASOS ABS: 0 10*3/uL (ref 0.0–0.1)
Basophils Relative: 0 %
EOS ABS: 0 10*3/uL (ref 0.0–0.7)
Eosinophils Relative: 0 %
HCT: 43.5 % (ref 39.0–52.0)
HEMOGLOBIN: 14.1 g/dL (ref 13.0–17.0)
LYMPHS ABS: 0.9 10*3/uL (ref 0.7–4.0)
Lymphocytes Relative: 10 %
MCH: 29.3 pg (ref 26.0–34.0)
MCHC: 32.4 g/dL (ref 30.0–36.0)
MCV: 90.4 fL (ref 78.0–100.0)
Monocytes Absolute: 1 10*3/uL (ref 0.1–1.0)
Monocytes Relative: 11 %
NEUTROS PCT: 79 %
Neutro Abs: 7.5 10*3/uL (ref 1.7–7.7)
Platelets: 168 10*3/uL (ref 150–400)
RBC: 4.81 MIL/uL (ref 4.22–5.81)
RDW: 14.5 % (ref 11.5–15.5)
WBC: 9.4 10*3/uL (ref 4.0–10.5)

## 2014-10-11 LAB — COMPREHENSIVE METABOLIC PANEL
ALT: 15 U/L — ABNORMAL LOW (ref 17–63)
AST: 30 U/L (ref 15–41)
Albumin: 3.7 g/dL (ref 3.5–5.0)
Alkaline Phosphatase: 75 U/L (ref 38–126)
Anion gap: 13 (ref 5–15)
BUN: 14 mg/dL (ref 6–20)
CHLORIDE: 103 mmol/L (ref 101–111)
CO2: 19 mmol/L — ABNORMAL LOW (ref 22–32)
CREATININE: 1.06 mg/dL (ref 0.61–1.24)
Calcium: 8.7 mg/dL — ABNORMAL LOW (ref 8.9–10.3)
GFR calc non Af Amer: >60 mL/min (ref >60)
Glucose, Bld: 105 mg/dL — ABNORMAL HIGH (ref 65–99)
Potassium: 3.7 mmol/L (ref 3.5–5.1)
Sodium: 135 mmol/L (ref 135–145)
TOTAL PROTEIN: 8.2 g/dL — AB (ref 6.5–8.1)
Total Bilirubin: 0.6 mg/dL (ref 0.3–1.2)

## 2014-10-11 LAB — URINALYSIS, DIPSTICK ONLY
Bilirubin Urine: NEGATIVE
GLUCOSE, UA: NEGATIVE mg/dL
Ketones, ur: NEGATIVE mg/dL
LEUKOCYTES UA: NEGATIVE
NITRITE: NEGATIVE
PROTEIN: NEGATIVE mg/dL
Specific Gravity, Urine: 1.015 (ref 1.005–1.030)
Urobilinogen, UA: 0.2 mg/dL (ref 0.0–1.0)
pH: 6 (ref 5.0–8.0)

## 2014-10-11 LAB — MRSA PCR SCREENING: MRSA BY PCR: POSITIVE — AB

## 2014-10-11 MED ORDER — LEVOTHYROXINE SODIUM 75 MCG PO TABS
75.0000 ug | ORAL_TABLET | Freq: Every day | ORAL | Status: DC
  Administered 2014-10-12 – 2014-10-13 (×2): 75 ug via ORAL
  Filled 2014-10-11 (×2): qty 1

## 2014-10-11 MED ORDER — HYDROMORPHONE HCL 1 MG/ML IJ SOLN: 0.5000 mg | Freq: Four times a day (QID) | INTRAMUSCULAR | Status: DC | PRN

## 2014-10-11 MED ORDER — HYDROCODONE-ACETAMINOPHEN 5-325 MG PO TABS: 1.0000 | ORAL_TABLET | Freq: Four times a day (QID) | ORAL | Status: DC | PRN

## 2014-10-11 MED ORDER — TIMOLOL MALEATE 0.5 % OP SOLN
1.0000 [drp] | Freq: Two times a day (BID) | OPHTHALMIC | Status: DC
  Administered 2014-10-11 – 2014-10-13 (×4): 1 [drp] via OPHTHALMIC
  Filled 2014-10-11: qty 5

## 2014-10-11 MED ORDER — METOPROLOL SUCCINATE ER 25 MG PO TB24
25.0000 mg | ORAL_TABLET | Freq: Every day | ORAL | Status: DC
  Administered 2014-10-11 – 2014-10-13 (×3): 25 mg via ORAL
  Filled 2014-10-11 (×3): qty 1

## 2014-10-11 MED ORDER — ALBUTEROL SULFATE (2.5 MG/3ML) 0.083% IN NEBU: 2.5000 mg | INHALATION_SOLUTION | RESPIRATORY_TRACT | Status: DC | PRN

## 2014-10-11 MED ORDER — ENOXAPARIN SODIUM 40 MG/0.4ML ~~LOC~~ SOLN
40.0000 mg | SUBCUTANEOUS | Status: DC
  Administered 2014-10-11 – 2014-10-12 (×2): 40 mg via SUBCUTANEOUS
  Filled 2014-10-11 (×3): qty 0.4

## 2014-10-11 MED ORDER — LATANOPROST 0.005 % OP SOLN
1.0000 [drp] | Freq: Every day | OPHTHALMIC | Status: DC
  Administered 2014-10-12: 1 [drp] via OPHTHALMIC
  Filled 2014-10-11: qty 2.5

## 2014-10-11 MED ORDER — DORZOLAMIDE HCL 2 % OP SOLN
1.0000 [drp] | Freq: Every morning | OPHTHALMIC | Status: DC
  Administered 2014-10-12 – 2014-10-13 (×2): 1 [drp] via OPHTHALMIC
  Filled 2014-10-11: qty 10

## 2014-10-11 MED ORDER — CLOPIDOGREL BISULFATE 75 MG PO TABS
75.0000 mg | ORAL_TABLET | Freq: Every day | ORAL | Status: DC
  Administered 2014-10-11 – 2014-10-13 (×3): 75 mg via ORAL
  Filled 2014-10-11 (×3): qty 1

## 2014-10-11 MED ORDER — SIMVASTATIN 40 MG PO TABS
40.0000 mg | ORAL_TABLET | Freq: Every day | ORAL | Status: DC
  Administered 2014-10-11 – 2014-10-13 (×3): 40 mg via ORAL
  Filled 2014-10-11 (×3): qty 1

## 2014-10-11 MED ORDER — TAMSULOSIN HCL 0.4 MG PO CAPS
0.4000 mg | ORAL_CAPSULE | Freq: Every day | ORAL | Status: DC
  Administered 2014-10-11 – 2014-10-13 (×3): 0.4 mg via ORAL
  Filled 2014-10-11 (×3): qty 1

## 2014-10-11 MED ORDER — ACETAMINOPHEN 325 MG PO TABS: 650.0000 mg | ORAL_TABLET | Freq: Four times a day (QID) | ORAL | Status: DC | PRN

## 2014-10-11 MED ORDER — ISOSORBIDE MONONITRATE ER 30 MG PO TB24
30.0000 mg | ORAL_TABLET | Freq: Every day | ORAL | Status: DC
  Administered 2014-10-11 – 2014-10-13 (×3): 30 mg via ORAL
  Filled 2014-10-11 (×3): qty 1

## 2014-10-11 MED ORDER — SODIUM CHLORIDE 0.9 % IV SOLN
INTRAVENOUS | Status: DC
  Administered 2014-10-11: 19:00:00 via INTRAVENOUS

## 2014-10-11 MED ORDER — CYCLOBENZAPRINE HCL 5 MG PO TABS
5.0000 mg | ORAL_TABLET | Freq: Three times a day (TID) | ORAL | Status: DC
  Administered 2014-10-11 – 2014-10-13 (×5): 5 mg via ORAL
  Filled 2014-10-11 (×4): qty 1

## 2014-10-11 MED ORDER — PROMETHAZINE HCL 25 MG PO TABS: 12.5000 mg | ORAL_TABLET | Freq: Four times a day (QID) | ORAL | Status: DC | PRN

## 2014-10-11 MED ORDER — FENTANYL CITRATE (PF) 100 MCG/2ML IJ SOLN
100.0000 ug | Freq: Once | INTRAMUSCULAR | Status: AC
  Administered 2014-10-11: 100 ug via INTRAVENOUS
  Filled 2014-10-11: qty 2

## 2014-10-11 MED ORDER — ACETAMINOPHEN 650 MG RE SUPP: 650.0000 mg | Freq: Four times a day (QID) | RECTAL | Status: DC | PRN

## 2014-10-11 MED ORDER — LOSARTAN POTASSIUM 50 MG PO TABS
50.0000 mg | ORAL_TABLET | Freq: Every day | ORAL | Status: DC
  Administered 2014-10-11 – 2014-10-13 (×3): 50 mg via ORAL
  Filled 2014-10-11 (×3): qty 1

## 2014-10-11 MED ORDER — ASPIRIN EC 81 MG PO TBEC
81.0000 mg | DELAYED_RELEASE_TABLET | Freq: Every day | ORAL | Status: DC
  Administered 2014-10-12 – 2014-10-13 (×2): 81 mg via ORAL
  Filled 2014-10-11 (×2): qty 1

## 2014-10-11 MED ORDER — FAMOTIDINE 20 MG PO TABS
10.0000 mg | ORAL_TABLET | Freq: Every day | ORAL | Status: DC
  Administered 2014-10-11 – 2014-10-13 (×3): 10 mg via ORAL
  Filled 2014-10-11 (×4): qty 1

## 2014-10-11 NOTE — Progress Notes (Signed)
Addendum  Bladder scan: >999 ML. Acute urinary retention. May be the etiology for his right groin pain. In and out cath when necessary and may have to place indwelling Foley catheter if has recurrent retention. Add Flomax 0.4 MG daily.   Marcellus Scott, MD, FACP, FHM. [facility] Pager [phone]  If 7PM-7AM, please contact night-coverage [email or web address] Password [facility] 10/11/2014, 6:33 PM

## 2014-10-11 NOTE — ED Notes (Signed)
Staff attempted to ambulate pt, but pt unable to move R leg d/t pain.  MD aware and feels pt needs to be admitted.

## 2014-10-11 NOTE — Progress Notes (Signed)
Bladder scanned result: >999, after attempting to urinate, with no success. Dr ordered in & out catheter. In and out catheter done by this nurse and Duwayne Heck, NT using sterile technique. Catheter drained out 1800 mL. Pt immediately felt better. Pt stated, "I'm not in near as much pain as I was before". Stood pt up at bedside. Pt able to bear weight on RLE, but still had some pain with standing. Night shift nurse aware of these findings. Bed placed in lowest position and call bell within reach. Family at bedside.

## 2014-10-11 NOTE — Progress Notes (Signed)
Report received from [place], [place]. All questions answered.

## 2014-10-11 NOTE — H&P (Addendum)
History and Physical  REFORD OLLIFF RUE:[id number] DOB: [birth year] DOA: 10/11/2014  Referring physician: Dr. Doug Sou, EDP PCP: [name], MD  Outpatient Specialists:  1. Not known  Chief Complaint: Right thigh/groin pain  HPI: Jeremy [name] is a 81 y.o. male , married, lives with spouse, independent of activities of daily living, PMH of HTN, hypothyroid, HLD, CAD status post stent, spinal stenosis with chronic low back pain, presented to the [facility] ED on 10/11/14 with complaints of acute onset of right thigh/groin pain and vomiting. Patient states that he was in his usual state of health when he went to bed last night. He woke up at approximately 2 AM and noted right thigh/groin pain, severe and rated 10/10 in severity, sharp, nonradiating, worse with movement of right lower extremity or weightbearing and better with keeping it still. He denies fall or trauma. He denies any unusual or heavy activity prior to this. He denies tingling, numbness or weakness in that extremity. He also had 2 episodes of nonbloody emesis without abdominal pain. Last BM 2 days ago. Passing flatus. Did not eat anything unusual. No nausea or fevers. Denies urinary difficulties or dysuria.? Recently treated for urinary tract infection. As per family report to EDP, patient intermittently confused over several weeks but mental status normal in ED. Also complains of earache? Bilateral. In the ED, patient noted to be afebrile, mildly elevated blood pressures, lab workup unremarkable including urine microscopy which was negative and x-ray of the right hip and CT of the right hip reveal no fractures. When ED staff attempted to ambulate, patient was unable to move right leg due to pain and hence hospitalist admission was requested for further evaluation and management.   Review of Systems: All systems reviewed and apart from history of presenting illness, are negative.  Past Medical History  Diagnosis Date  . Bleeding  ulcer   . Spinal stenosis   . Thyroid disease   . Hypertension   . Coronary artery disease     non-ST segment elevation myocardial infarction February of 2004 she was stenting of the circumflex using a Cypher drug-eluting stent.  . Hyperlipidemia   . Claudication (HCC)   . Macular degeneration    Past Surgical History  Procedure Laterality Date  . Coronary angioplasty with stent placement    . Thyroidectomy, partial    . Renal doppler  08/17/2005    Normal evaluation  . Cardiac catheterization  02/25/2002    Proximal L Circumflex 95% lesion, stented w/ 3x18-mm CYPHER stent avoiding the 2 L Marginal branch, jailing the 1st marginal, resulting in reduction of a 90-95% lesion to 0% residual  . Cardiovascular stress test  12/18/2006    EKG negative for ischemia, no significant ischemia demonstrated.  . Transthoracic echocardiogram  03/13/2002    EF >55%, moderate LVH,   . Back surgery     Social History:  reports that he has quit smoking. He has never used smokeless tobacco. He reports that he does not drink alcohol or use illicit drugs. Rest as per history of presenting illness.  Allergies  Allergen Reactions  . Tramadol Nausea And Vomiting    Family History  Problem Relation Age of Onset  . Family history unknown: Yes  Interviewed patient regarding family history and he denies any significant family history.   Prior to Admission medications   Medication Sig Start Date End Date Taking? Authorizing Provider  aspirin EC 81 MG tablet Take 81 mg by mouth daily.  Yes Historical Provider, MD  clopidogrel (PLAVIX) 75 MG tablet Take 1 tablet (75 mg total) by mouth daily. 07/31/14  Yes Runell Gess, MD  dorzolamide (TRUSOPT) 2 % ophthalmic solution Place 1 drop into the right eye every morning.  08/23/12  Yes Historical Provider, MD  isosorbide mononitrate (IMDUR) 30 MG 24 hr tablet Take 1 tablet (30 mg total) by mouth daily. 12/01/13  Yes Runell Gess, MD  latanoprost (XALATAN)  0.005 % ophthalmic solution Place 1 drop into both eyes at bedtime.   Yes Historical Provider, MD  levothyroxine (SYNTHROID, LEVOTHROID) 75 MCG tablet Take 75 mcg by mouth daily before breakfast.   Yes Historical Provider, MD  losartan (COZAAR) 50 MG tablet  10/08/12  Yes Historical Provider, MD  metoprolol succinate (TOPROL-XL) 50 MG 24 hr tablet Take 25 mg by mouth daily. Take with or immediately following a meal.   Yes Historical Provider, MD  ranitidine (ZANTAC) 75 MG tablet Take 75 mg by mouth daily.   Yes Historical Provider, MD  simvastatin (ZOCOR) 40 MG tablet Take 40 mg by mouth daily.   Yes Historical Provider, MD  timolol (TIMOPTIC) 0.5 % ophthalmic solution Place 1 drop into both eyes 2 (two) times daily. Only at bedtime patient places 1 drop in right eye 09/18/12  Yes Historical Provider, MD   Physical Exam: Filed Vitals:   10/11/14 1615 10/11/14 1645 10/11/14 1715 10/11/14 1730  BP: 171/92 147/89 163/87 150/81  Pulse: 91 101 92 86  Temp:      TempSrc:      Resp: 13 14 14 16   Height:      Weight:      SpO2: 100% 98% 99% 98%   temperature 98.58F.   General exam: Moderately built and nourished pleasant elderly male patient, lying comfortably propped up on the gurney in no obvious distress.  Head, eyes and ENT: Nontraumatic and normocephalic. Right pupil 2 mm round and reacting to light. Left irregular pupil secondary to surgical peripheral iridotomy-reacting to light. Bilateral arcus senilis. Oral mucosa moist.  Neck: Supple. No JVD, carotid bruit or thyromegaly.  Lymphatics: No lymphadenopathy.  Respiratory system: Clear to auscultation. No increased work of breathing.  Cardiovascular system: S1 and S2 heard, RRR. No JVD, murmurs, gallops, clicks or pedal edema.  Gastrointestinal system: Abdomen is nondistended, soft and nontender. Normal bowel sounds heard. No masses appreciated.? Palpable bladder-will get a bladder scan.  Central nervous system: Alert and oriented. No  focal neurological deficits.  Extremities: Symmetric 5 x 5 power. Peripheral pulses symmetrically felt. Mild tenderness without any other acute findings in the right groin area and painful range of movements of right groin. Patient guarding and hesitant to move right leg secondary to pain.  Skin: No rashes or acute findings.  Musculoskeletal system: Negative exam.  Psychiatry: Pleasant and cooperative.   Labs on Admission:  Basic Metabolic Panel:  Recent Labs Lab 10/11/14 1129  NA 135  K 3.7  CL 103  CO2 19*  GLUCOSE 105*  BUN 14  CREATININE 1.06  CALCIUM 8.7*   Liver Function Tests:  Recent Labs Lab 10/11/14 1129  AST 30  ALT 15*  ALKPHOS 75  BILITOT 0.6  PROT 8.2*  ALBUMIN 3.7   No results for input(s): LIPASE, AMYLASE in the last 168 hours. No results for input(s): AMMONIA in the last 168 hours. CBC:  Recent Labs Lab 10/11/14 1129  WBC 9.4  NEUTROABS 7.5  HGB 14.1  HCT 43.5  MCV 90.4  PLT 168  Cardiac Enzymes: No results for input(s): CKTOTAL, CKMB, CKMBINDEX, TROPONINI in the last 168 hours.  BNP (last 3 results) No results for input(s): PROBNP in the last 8760 hours. CBG: No results for input(s): GLUCAP in the last 168 hours.  Radiological Exams on Admission: Ct Hip Right Wo Contrast  10/11/2014   CLINICAL DATA:  Right thigh pain. The patient is unable to bear weight due to the pain.  EXAM: CT OF THE RIGHT HIP WITHOUT CONTRAST  TECHNIQUE: Multidetector CT imaging of the right hip was performed according to the standard protocol. Multiplanar CT image reconstructions were also generated.  COMPARISON:  Radiographs dated 10/11/2014  FINDINGS: There is no fracture or dislocation. The patient has moderately severe arthritis of the right hip with a small right hip effusion. There is an old avulsion from from the lesser trochanter.  The patient has severe spinal stenosis at L3-4 and at L4-5 with severe right foraminal stenosis at L4-5. There is also fairly  severe spinal stenosis at L5-S1.  There is marked distention of the bladder almost to the top of the pelvis.  IMPRESSION: 1. No acute abnormality of the right hip. Moderately severe arthritis of the right hip. 2. Severe spinal stenosis at L3-4 and L4-5 and L5-S1 with severe right foraminal stenosis at L4-5. 3. Distended urinary bladder.   Electronically Signed   By: Francene Boyers M.D.   On: 10/11/2014 14:49   Dg Hip Unilat With Pelvis 2-3 Views Right  10/11/2014   CLINICAL DATA:  Right hip pain for 4 days without known injury. Difficulty ambulating.  EXAM: DG HIP (WITH OR WITHOUT PELVIS) 2-3V RIGHT  COMPARISON:  None.  FINDINGS: No acute fracture or dislocation is identified. There is evidence of moderate osteoarthritis involving the hip joint with joint space narrowing and proliferative changes present. Heterotopic bone formation is noted adjacent to the lesser trochanter, potentially secondary to prior trauma. The bony pelvis is intact and shows no evidence of fracture or diastasis. Soft tissues are unremarkable. No bony lesions or destruction identified.  IMPRESSION: Moderate osteoarthritis of the right hip joint. Heterotopic bone adjacent to the lesser trochanter, potentially secondary to prior trauma. No acute fracture or dislocation.   Electronically Signed   By: Irish Lack M.D.   On: 10/11/2014 12:08    EKG: None seen in Epic for today.  Assessment/Plan Principal Problem:   Acute pain of right thigh Active Problems:   Coronary artery disease   Essential hypertension   Hyperlipidemia   Hypothyroidism   Spinal stenosis   Acute pain of right thigh/groin - Possibly muscular etiology i.e. muscle sprain/pulled muscle - No history of fall or trauma. - CT and x-ray of right hip negative for fractures but show right hip arthritis - Unable to ambulate in ED secondary to significant pain and apparently independent at home - Admit for observation and management - Treat supportively with K  pad, pain management, muscle relaxants, PT and OT evaluation  Nausea and vomiting - 2 early this a.m.? Related to pain. - Seems to have resolved. Trial of regular diet and monitor. Continue home dose of H2 blocker  Essential hypertension - Uncontrolled. Patient has not taken his medications today. Resume home medications and monitor  Hypothyroid - Continue Synthroid  Hyperlipidemia - Continue statins  CAD status post stent - Asymptomatic of chest pain. Continue aspirin, Plavix, statins, nitrates and beta blockers  Distended bladder per CT - Patient states that he has sensation to void at this time. Check bladder scan  if unable to void  Spinal stenosis with chronic low back pain - Unchanged as per patient  Intermittent confusion - Reported by family. Patient appears coherent at this time but does have memory impairment. It is possible that he has some degree of dementia    DVT prophylaxis: Subcutaneous Lovenox Code Status: Full  Family Communication: Discussed extensively with extended family at bedside: Spouse, granddaughter, grandson and his wife & daughter via speaker phone  Disposition Plan: DC home, possibly 10/10   Time spent: 50 minutes  Jeremy Walgren, MD, FACP, FHM. [facility] Pager [phone]  If 7PM-7AM, please contact night-coverage [email or web address] Password [email or web address] 10/11/2014, 5:39 PM

## 2014-10-11 NOTE — ED Notes (Signed)
Pt states R thigh pain when he woke up today.  States unable to bear weight d/t pain.  Leg does appear to be spasiming.  Family states pt did vomit x 1 last night and he is d/t finish antibiotics for uti this am.  Family also states pt has been increasingly confused over the past several weeks.

## 2014-10-11 NOTE — ED Notes (Signed)
Pt attempting to give urine sample

## 2014-10-11 NOTE — ED Notes (Signed)
Called CT.  They're on their way to pick up pt.

## 2014-10-11 NOTE — ED Provider Notes (Signed)
CSN: [id number]     Arrival date & time 10/11/14  1035 History   First MD Initiated Contact with Patient 10/11/14 1048     Chief Complaint  Patient presents with  . Leg Pain     (Consider location/radiation/quality/duration/timing/severity/associated sxs/prior Treatment) HPI Complains of pain at right hip onset 2 AM today. Pain is nonradiating worse with attempting to weight-bear. He could not walk this morning due to pain. Pain is improved with remaining still. Pain is minimal at present. However severe when he attempts to move. He also vomited twice at 2 AM today at 2:30 AM today. He is not nauseated at present. Denies having had any abdominal pain chest pain or headache. No urinary symptoms. No other associated symptoms. Daughter who accompanies him states that he's been intermittently confused for several weeks. His mental status now appears to be normal to her. No treatment prior to coming here Past Medical History  Diagnosis Date  . Bleeding ulcer   . Spinal stenosis   . Thyroid disease   . Hypertension   . Coronary artery disease     non-ST segment elevation myocardial infarction February of 2004 she was stenting of the circumflex using a Cypher drug-eluting stent.  . Hyperlipidemia   . Claudication (HCC)   . Macular degeneration    Past Surgical History  Procedure Laterality Date  . Coronary angioplasty with stent placement    . Thyroidectomy, partial    . Renal doppler  08/17/2005    Normal evaluation  . Cardiac catheterization  02/25/2002    Proximal L Circumflex 95% lesion, stented w/ 3x18-mm CYPHER stent avoiding the 2 L Marginal branch, jailing the 1st marginal, resulting in reduction of a 90-95% lesion to 0% residual  . Cardiovascular stress test  12/18/2006    EKG negative for ischemia, no significant ischemia demonstrated.  . Transthoracic echocardiogram  03/13/2002    EF >55%, moderate LVH,   . Back surgery     Family History  Problem Relation Age of Onset  .  Family history unknown: Yes   Social History  Substance Use Topics  . Smoking status: Former Games developer  . Smokeless tobacco: Never Used  . Alcohol Use: No    Review of Systems  Constitutional: Negative.   HENT: Negative.   Respiratory: Negative.   Cardiovascular: Negative.   Gastrointestinal: Positive for vomiting.  Musculoskeletal: Positive for arthralgias and gait problem.       Right hip pain  Skin: Negative.   Neurological: Negative.        Intermittent confusion for several weeks  Psychiatric/Behavioral: Negative.   All other systems reviewed and are negative.     Allergies  Tramadol  Home Medications   Prior to Admission medications   Medication Sig Start Date End Date Taking? Authorizing Provider  aspirin EC 81 MG tablet Take 81 mg by mouth daily.    Historical Provider, MD  clopidogrel (PLAVIX) 75 MG tablet Take 1 tablet (75 mg total) by mouth daily. 07/31/14   Runell Gess, MD  dorzolamide (TRUSOPT) 2 % ophthalmic solution Place 1 drop into the right eye every morning.  08/23/12   Historical Provider, MD  gabapentin (NEURONTIN) 300 MG capsule Take 300 mg by mouth 2 (two) times daily. Patient supposed to be going on up dose to 300 mg 3 times daily. Patient has been taking 300 mg twice daily for 10 days.    Historical Provider, MD  isosorbide mononitrate (IMDUR) 30 MG 24 hr tablet Take 1 tablet (  30 mg total) by mouth daily. 12/01/13   Runell Gess, MD  latanoprost (XALATAN) 0.005 % ophthalmic solution Place 1 drop into both eyes at bedtime.    Historical Provider, MD  levofloxacin (LEVAQUIN) 750 MG tablet Take 1 tablet (750 mg total) by mouth daily. X 5 more days 01/28/14   Ripudeep Jenna Luo, MD  levothyroxine (SYNTHROID, LEVOTHROID) 75 MCG tablet Take 75 mcg by mouth daily before breakfast.    Historical Provider, MD  losartan (COZAAR) 50 MG tablet  10/08/12   Historical Provider, MD  metoprolol succinate (TOPROL-XL) 25 MG 24 hr tablet Take 1 tablet (25 mg total) by  mouth daily. Take with or immediately following a meal. 01/28/14   Ripudeep Jenna Luo, MD  ondansetron (ZOFRAN ODT) 4 MG disintegrating tablet Take 1 tablet (4 mg total) by mouth every 8 (eight) hours as needed for nausea or vomiting. 01/28/14   Ripudeep Jenna Luo, MD  ranitidine (ZANTAC) 75 MG tablet Take 75 mg by mouth daily.    Historical Provider, MD  simvastatin (ZOCOR) 40 MG tablet Take 40 mg by mouth daily.    Historical Provider, MD  timolol (TIMOPTIC) 0.5 % ophthalmic solution Place 1 drop into both eyes 2 (two) times daily. Only at bedtime patient places 1 drop in right eye 09/18/12   Historical Provider, MD   BP 176/97 mmHg  Pulse 98  Temp(Src) 99.1 F (37.3 C) (Oral)  Resp 18  Ht 5\' 11"  (1.803 m)  Wt 165 lb (74.844 kg)  BMI 23.02 kg/m2  SpO2 100% Physical Exam  Constitutional: He is oriented to person, place, and time. He appears well-developed and well-nourished.  HENT:  Head: Normocephalic and atraumatic.  Eyes: Conjunctivae are normal. Pupils are equal, round, and reactive to light.  Neck: Neck supple. No tracheal deviation present. No thyromegaly present.  Cardiovascular: Normal rate and regular rhythm.   No murmur heard. Pulmonary/Chest: Effort normal and breath sounds normal.  Abdominal: Soft. Bowel sounds are normal. He exhibits no distension. There is no tenderness.  Musculoskeletal: Normal range of motion. He exhibits no edema or tenderness.  . Right lower extremity no deformity no swelling DP pulse 2+. He has pain at hip on internal rotation of thigh. All other extremities without redness or tenderness neurovascular intact  Neurological: He is alert and oriented to person, place, and time. No cranial nerve deficit. Coordination normal.  Skin: Skin is warm and dry. No rash noted.  Psychiatric: He has a normal mood and affect. His behavior is normal.  Nursing note and vitals reviewed.   ED Course  Procedures (including critical care time) Labs Review Labs Reviewed - No  data to display  Imaging Review No results found. I have personally reviewed and evaluated these images and lab results as part of my medical decision-making.   EKG Interpretation None     Declines pain medicine presently 12:40 PM requested pain medicine. Fentanyl ordered. 3:45 PM patient is resting comfortably however upon standing he is unable to weight-bear on his right leg and unable to walk. He is comfortable while lying still. Results for orders placed or performed during the hospital encounter of 10/11/14  Comprehensive metabolic panel  Result Value Ref Range   Sodium 135 135 - 145 mmol/L   Potassium 3.7 3.5 - 5.1 mmol/L   Chloride 103 101 - 111 mmol/L   CO2 19 (L) 22 - 32 mmol/L   Glucose, Bld 105 (H) 65 - 99 mg/dL   BUN 14 6 - 20  mg/dL   Creatinine, Ser 1.61 0.61 - 1.24 mg/dL   Calcium 8.7 (L) 8.9 - 10.3 mg/dL   Total Protein 8.2 (H) 6.5 - 8.1 g/dL   Albumin 3.7 3.5 - 5.0 g/dL   AST 30 15 - 41 U/L   ALT 15 (L) 17 - 63 U/L   Alkaline Phosphatase 75 38 - 126 U/L   Total Bilirubin 0.6 0.3 - 1.2 mg/dL   GFR calc non Af Amer >60 >60 mL/min   GFR calc Af Amer >60 >60 mL/min   Anion gap 13 5 - 15  CBC with Differential/Platelet  Result Value Ref Range   WBC 9.4 4.0 - 10.5 K/uL   RBC 4.81 4.22 - 5.81 MIL/uL   Hemoglobin 14.1 13.0 - 17.0 g/dL   HCT 09.6 04.5 - 40.9 %   MCV 90.4 78.0 - 100.0 fL   MCH 29.3 26.0 - 34.0 pg   MCHC 32.4 30.0 - 36.0 g/dL   RDW 81.1 91.4 - 78.2 %   Platelets 168 150 - 400 K/uL   Neutrophils Relative % 79 %   Neutro Abs 7.5 1.7 - 7.7 K/uL   Lymphocytes Relative 10 %   Lymphs Abs 0.9 0.7 - 4.0 K/uL   Monocytes Relative 11 %   Monocytes Absolute 1.0 0.1 - 1.0 K/uL   Eosinophils Relative 0 %   Eosinophils Absolute 0.0 0.0 - 0.7 K/uL   Basophils Relative 0 %   Basophils Absolute 0.0 0.0 - 0.1 K/uL  Urinalysis, dipstick only  Result Value Ref Range   Color, Urine YELLOW YELLOW   APPearance CLOUDY (A) CLEAR   Specific Gravity, Urine 1.015  1.005 - 1.030   pH 6.0 5.0 - 8.0   Glucose, UA NEGATIVE NEGATIVE mg/dL   Hgb urine dipstick SMALL (A) NEGATIVE   Bilirubin Urine NEGATIVE NEGATIVE   Ketones, ur NEGATIVE NEGATIVE mg/dL   Protein, ur NEGATIVE NEGATIVE mg/dL   Urobilinogen, UA 0.2 0.0 - 1.0 mg/dL   Nitrite NEGATIVE NEGATIVE   Leukocytes, UA NEGATIVE NEGATIVE   Ct Hip Right Wo Contrast  10/11/2014   CLINICAL DATA:  Right thigh pain. The patient is unable to bear weight due to the pain.  EXAM: CT OF THE RIGHT HIP WITHOUT CONTRAST  TECHNIQUE: Multidetector CT imaging of the right hip was performed according to the standard protocol. Multiplanar CT image reconstructions were also generated.  COMPARISON:  Radiographs dated 10/11/2014  FINDINGS: There is no fracture or dislocation. The patient has moderately severe arthritis of the right hip with a small right hip effusion. There is an old avulsion from from the lesser trochanter.  The patient has severe spinal stenosis at L3-4 and at L4-5 with severe right foraminal stenosis at L4-5. There is also fairly severe spinal stenosis at L5-S1.  There is marked distention of the bladder almost to the top of the pelvis.  IMPRESSION: 1. No acute abnormality of the right hip. Moderately severe arthritis of the right hip. 2. Severe spinal stenosis at L3-4 and L4-5 and L5-S1 with severe right foraminal stenosis at L4-5. 3. Distended urinary bladder.   Electronically Signed   By: Francene Boyers M.D.   On: 10/11/2014 14:49   Dg Hip Unilat With Pelvis 2-3 Views Right  10/11/2014   CLINICAL DATA:  Right hip pain for 4 days without known injury. Difficulty ambulating.  EXAM: DG HIP (WITH OR WITHOUT PELVIS) 2-3V RIGHT  COMPARISON:  None.  FINDINGS: No acute fracture or dislocation is identified. There is evidence  of moderate osteoarthritis involving the hip joint with joint space narrowing and proliferative changes present. Heterotopic bone formation is noted adjacent to the lesser trochanter, potentially  secondary to prior trauma. The bony pelvis is intact and shows no evidence of fracture or diastasis. Soft tissues are unremarkable. No bony lesions or destruction identified.  IMPRESSION: Moderate osteoarthritis of the right hip joint. Heterotopic bone adjacent to the lesser trochanter, potentially secondary to prior trauma. No acute fracture or dislocation.   Electronically Signed   By: Irish Lack M.D.   On: 10/11/2014 12:08   Xray viewed by me MDM  CT scan ordered to check for occult hip fracture. Patient is a fall risk considering painful right hip. I spoke with Dr.Hongalgi plan is 23 hour observation to medical surgical floor, analgesia. suggest physical therapy consult. Patient may require short-term placement in skilled nursing facility for rehabilitation. Doubt infection. Normal CT scan aside from arthritis, no leukocytosis no fever Final diagnoses:  None   diagnosis right hip pain      Doug Sou, MD 10/11/14 1657

## 2014-10-12 DIAGNOSIS — I10 Essential (primary) hypertension: Secondary | ICD-10-CM | POA: Diagnosis not present

## 2014-10-12 DIAGNOSIS — M79651 Pain in right thigh: Secondary | ICD-10-CM

## 2014-10-12 DIAGNOSIS — M25551 Pain in right hip: Secondary | ICD-10-CM

## 2014-10-12 DIAGNOSIS — F0390 Unspecified dementia without behavioral disturbance: Secondary | ICD-10-CM

## 2014-10-12 DIAGNOSIS — R339 Retention of urine, unspecified: Secondary | ICD-10-CM

## 2014-10-12 DIAGNOSIS — H547 Unspecified visual loss: Secondary | ICD-10-CM | POA: Diagnosis not present

## 2014-10-12 DIAGNOSIS — N179 Acute kidney failure, unspecified: Secondary | ICD-10-CM

## 2014-10-12 LAB — BASIC METABOLIC PANEL
ANION GAP: 11 (ref 5–15)
BUN: 16 mg/dL (ref 6–20)
CHLORIDE: 103 mmol/L (ref 101–111)
CO2: 23 mmol/L (ref 22–32)
Calcium: 8.5 mg/dL — ABNORMAL LOW (ref 8.9–10.3)
Creatinine, Ser: 1.32 mg/dL — ABNORMAL HIGH (ref 0.61–1.24)
GFR calc non Af Amer: 47 mL/min — ABNORMAL LOW (ref >60)
GFR, EST AFRICAN AMERICAN: 54 mL/min — AB (ref >60)
Glucose, Bld: 115 mg/dL — ABNORMAL HIGH (ref 65–99)
Potassium: 3.5 mmol/L (ref 3.5–5.1)
SODIUM: 137 mmol/L (ref 135–145)

## 2014-10-12 MED ORDER — FINASTERIDE 5 MG PO TABS
5.0000 mg | ORAL_TABLET | Freq: Every day | ORAL | Status: DC
  Administered 2014-10-12 – 2014-10-13 (×2): 5 mg via ORAL
  Filled 2014-10-12 (×2): qty 1

## 2014-10-12 MED ORDER — MUPIROCIN 2 % EX OINT
TOPICAL_OINTMENT | Freq: Two times a day (BID) | CUTANEOUS | Status: DC
  Administered 2014-10-12 – 2014-10-13 (×3): via NASAL
  Filled 2014-10-12 (×2): qty 22

## 2014-10-12 MED ORDER — CHLORHEXIDINE GLUCONATE CLOTH 2 % EX PADS
6.0000 | MEDICATED_PAD | Freq: Every day | CUTANEOUS | Status: DC
  Administered 2014-10-13: 6 via TOPICAL

## 2014-10-12 NOTE — Evaluation (Signed)
Occupational Therapy Evaluation Patient Details Name: Jeremy [name] MRN: [id number] DOB: 1925-06-30 Today's Date: 10/12/2014    History of Present Illness 81 y.o. male , married, lives with spouse, independent of activities of daily living, PMH of HTN, hypothyroid, HLD, CAD status post stent, spinal stenosis with chronic low back pain, presented to the [facility] ED on 10/11/14 with complaints of acute onset of right thigh/groin pain and vomiting. Patient states that he was in his usual state of health when he went to bed and woke up at approximately 2 AM and noted right thigh/groin pain, severe and rated 10/10 in severity, sharp, nonradiating, worse with movement of right lower extremity or weightbearing and better with keeping it still. Pt s/p in and out cath with removal of 1800 mL urine with pt feeling almost immediately better.   Clinical Impression   Patient presenting with deconditioning and decreased independence secondary to above. Patient independent to mod I PTA. Patient currently functioning at an overall min assist level. Patient will benefit from acute OT to increase overall independence in the areas of ADLs, functional mobility, and overall safety in order to safely discharge home with wife and someone assisting 24/7.     Follow Up Recommendations  No OT follow up;Supervision/Assistance - 24 hour    Equipment Recommendations  None recommended by OT    Recommendations for Other Services  None at this time   Precautions / Restrictions Precautions Precautions: Fall Restrictions Weight Bearing Restrictions: Yes RLE Weight Bearing: Weight bearing as tolerated    Mobility Bed Mobility Overal bed mobility: Needs Assistance Bed Mobility: Supine to Sit;Sit to Supine     Supine to sit: Supervision Sit to supine: Supervision   General bed mobility comments: Pt with good carryover and used LLE to assist with RLE during bed mobility, use of bed rails needed for supine to/from  sit  Transfers Overall transfer level: Needs assistance Equipment used: Rolling walker (2 wheeled) Transfers: Sit to/from Stand Sit to Stand: Min guard  General transfer comment: Cues for hand placement and technique     Balance Overall balance assessment: Needs assistance Sitting-balance support: No upper extremity supported;Feet supported Sitting balance-Leahy Scale: Good     Standing balance support: Bilateral upper extremity supported;During functional activity Standing balance-Leahy Scale: Fair    ADL Overall ADL's : Needs assistance/impaired Eating/Feeding: Set up;Sitting   Grooming: Set up;Sitting   Upper Body Bathing: Set up;Sitting   Lower Body Bathing: Minimal assistance;Sit to/from stand   Upper Body Dressing : Set up;Sitting   Lower Body Dressing: Minimal assistance;Sit to/from stand   Toilet Transfer: Min guard;RW;Regular Toilet;Grab bars     Toileting - Clothing Manipulation Details (indicate cue type and reason): did not occur   Tub/Shower Transfer Details (indicate cue type and reason): did not occur Functional mobility during ADLs: Min guard;Rolling walker General ADL Comments: Cues for safety during ADL and functional mobility needed. Pt able to cross LLE for LB ADLs, but with difficult time crossing RLE. Encouraged pt to work on this to increase independence with LB ADLs. Pt ambulated to and from BR for toilet transfer. Encouraged pt to use 3-in-1 over toilet seat at home to increase independence and safety with toilet transfers.     Pertinent Vitals/Pain Pain Assessment: 0-10 Pain Score: 8  Faces Pain Scale: Hurts a little bit Pain Location: right groin/thigh area Pain Descriptors / Indicators: Sore;Burning Pain Intervention(s): Monitored during session;Repositioned     Hand Dominance Right   Extremity/Trunk Assessment Upper Extremity Assessment Upper  Extremity Assessment: Overall WFL for tasks assessed   Lower Extremity Assessment Lower  Extremity Assessment: Defer to PT evaluation RLE Deficits / Details: 2+ to 3-/5  due to soreness   Cervical / Trunk Assessment Cervical / Trunk Assessment: Normal   Communication Communication Communication: No difficulties   Cognition Arousal/Alertness: Awake/alert Behavior During Therapy: WFL for tasks assessed/performed Overall Cognitive Status: Within Functional Limits for tasks assessed              Home Living Family/patient expects to be discharged to:: Private residence Living Arrangements: Spouse/significant other Available Help at Discharge: Family;Available 24 hours/day (grandson states they are working on 24/7 supervision.) Type of Home: House Home Access: Stairs to enter Secretary/administrator of Steps: 3 Entrance Stairs-Rails: Left Home Layout: Multi-level Alternate Level Stairs-Number of Steps: 7 steps between levels   Bathroom Shower/Tub: Tub/shower unit;Walk-in shower   Bathroom Toilet: Standard     Home Equipment: Environmental consultant - 2 wheels;Cane - single point;Bedside commode;Shower seat   Additional Comments: decreased vision      Prior Functioning/Environment Level of Independence: Independent  Comments: Lives with wife who has dementia. Family checks on frequently. Daughters do breakfast and dinner for wife.    OT Diagnosis: Generalized weakness;Acute pain   OT Problem List: Decreased strength;Decreased range of motion;Decreased activity tolerance;Impaired balance (sitting and/or standing);Decreased safety awareness;Decreased knowledge of use of DME or AE;Decreased knowledge of precautions;Pain   OT Treatment/Interventions: Self-care/ADL training;Therapeutic exercise;Energy conservation;DME and/or AE instruction;Therapeutic activities;Patient/family education;Balance training    OT Goals(Current goals can be found in the care plan section) Acute Rehab OT Goals Patient Stated Goal: go home OT Goal Formulation: With patient/family Time For Goal  Achievement: 10/26/14 Potential to Achieve Goals: Good ADL Goals Pt Will Perform Grooming: with modified independence;standing Pt Will Perform Lower Body Bathing: with modified independence;sit to/from stand Pt Will Perform Lower Body Dressing: with modified independence;sit to/from stand Pt Will Transfer to Toilet: with modified independence;ambulating;bedside commode Pt Will Perform Tub/Shower Transfer: Shower transfer;rolling walker;ambulating;shower seat;grab bars;with modified independence Additional ADL Goal #1: Pt will be mod I with functional mobility using RW prn  OT Frequency: Min 2X/week   Barriers to D/C: None known at this time, family is working to get 24/7 supervision set-up   End of Session Equipment Utilized During Treatment: Gait belt;Rolling walker  Activity Tolerance: Patient tolerated treatment well Patient left: in bed;with call bell/phone within reach;with family/visitor present   Time: 1231-1300 OT Time Calculation (min): 29 min Charges:  OT General Charges $OT Visit: 1 Procedure OT Evaluation $Initial OT Evaluation Tier I: 1 Procedure OT Treatments $Self Care/Home Management : 8-22 mins G-Codes: OT G-codes **NOT FOR INPATIENT CLASS** Functional Limitation: Self care Self Care Current Status (J1914): At least 20 percent but less than 40 percent impaired, limited or restricted Self Care Goal Status (N8295): At least 1 percent but less than 20 percent impaired, limited or restricted  Eldra Word , MS, OTR/L, CLT Pager: [phone]  10/12/2014, 1:17 PM

## 2014-10-12 NOTE — Care Management Note (Addendum)
Case Management Note  Patient Details  Name: Jeremy [name] MRN: [id number] Date of Birth: July 10, 1925  Subjective/Objective:                  Date-10-12-14 Monday Initial Assessment  Spoke with patient at the bedside along with wife and family.  Introduced self as Sports coach and explained role in discharge planning and how to be reached.  Verified patient lives at home with wife.  Verified patient anticipates to go home with spouse at time of discharge and will have part-time supervision by family. patient has 2 daughters that come by, patient's wife has dementia.  Patient has DME walker. Expressed potential need for no other DME.  Patient denied  needing help with their medication.  Patient is driven by daughters to MD appointments.  Verified patient has PCP Dr Allyne Gee. Patient states they currently receive HH services through no one.  Family provided with private duty list for [facility]HA.  Plan: CM will continue to follow for discharge planning and [facility] resources.   Lawerance Sabal RN BSN CM [phone]   Action/Plan:  10-13-14 Spoke with Daughter Jeremy [name], family chose [facility] for [facility] needs. Referral made to [facility] for [facility] RN Aide, SW and PT. Patient to discharge today.    Expected Discharge Date:                  Expected Discharge Plan:  Home/Self Care  In-House Referral:     Discharge planning Services  CM Consult  Post Acute Care Choice:    Choice offered to:     DME Arranged:    DME Agency:     HH Arranged:    HH Agency:     Status of Service:  In process, will continue to follow  Medicare Important Message Given:    Date Medicare IM Given:    Medicare IM give by:    Date Additional Medicare IM Given:    Additional Medicare Important Message give by:     If discussed at Long Length of Stay Meetings, dates discussed:    Additional Comments:  Lawerance Sabal, RN 10/12/2014, 3:14 PM

## 2014-10-12 NOTE — Progress Notes (Addendum)
PROGRESS NOTE  Jeremy [name] [name]:[id number] DOB: 1925/08/19 DOA: 10/11/2014 PCP: [name], MD  HPI/Recap of past 24 hours:  Reported feeling much better, sitting in chair, NAD, still have not voided since last in and out cath, grandson in room.  Assessment/Plan: Principal Problem:   Acute pain of right thigh Active Problems:   Coronary artery disease   Essential hypertension   Hyperlipidemia   Hypothyroidism   Spinal stenosis  Urinary retention: Distended bladder per CT - required two in and out foley. Has not void since last done. Start flomax/proscar, if fail voiding trial, will need foley at discharge and urology outpatient follow up. - UA no infection, no blood   Mild cr elevation, acute renal injury from urinary retention, repeat bmp in am. Avoid nephrotoxin   Acute pain of right thigh/groin - CT and x-ray of right hip negative for fractures but show right hip arthritis - Unable to ambulate in ED secondary to significant pain and apparently independent at home - much improved after urinary retention resolved, still some mild tender and mild pain on ambulation, but walked with PT in the hallway with a walker, will need home health.   Nausea and vomiting - 2 early this a.m.? Related to pain. -resolved. Tolerating regular diet and monitor. Continue home dose of H2 blocker  Essential hypertension - Uncontrolled. Patient has not taken his medications today. Resume home medications and monitor -better  Hypothyroid - Continue Synthroid  Hyperlipidemia - Continue statins  CAD status post stent - Asymptomatic of chest pain. Continue aspirin, Plavix, statins, nitrates and beta blockers   Spinal stenosis with chronic low back pain - Unchanged as per patient -PT and home health  Intermittent confusion - Reported by family. Patient is not oriented to time but to person/place and to situation.  - ? Baseline dementia  MRSA colonization: bactroban ointment,  chlorhexidine cloth decolonization and contact precaution.  DVT prophylaxis: Subcutaneous Lovenox Code Status: Full  Family Communication: Discussed with grandson at bedside Disposition Plan: DC home with home health and ? Foley 10/11   Consultants:  none  Procedures:  In and out of foley x2  Antibiotics:  none   Objective: BP 141/82 mmHg  Pulse 81  Temp(Src) 100 F (37.8 C) (Oral)  Resp 18  Ht 6' (1.829 m)  Wt 158 lb 6.4 oz (71.85 kg)  BMI 21.48 kg/m2  SpO2 100%  Intake/Output Summary (Last 24 hours) at 10/12/14 1238 Last data filed at 10/12/14 0918  Gross per 24 hour  Intake    200 ml  Output   1800 ml  Net  -1600 ml   Filed Weights   10/11/14 1045 10/11/14 1800  Weight: 165 lb (74.844 kg) 158 lb 6.4 oz (71.85 kg)    Exam:   General:  NAD  Cardiovascular: RRR  Respiratory: CTABL  Abdomen: Soft/ND/NT, positive BS  Musculoskeletal: No Edema  Neuro: not oriented to time, but to place/person and situation. Likely baseline. No focal neurological deficit.  Data Reviewed: Basic Metabolic Panel:  Recent Labs Lab 10/11/14 1129 10/12/14 0458  NA 135 137  K 3.7 3.5  CL 103 103  CO2 19* 23  GLUCOSE 105* 115*  BUN 14 16  CREATININE 1.06 1.32*  CALCIUM 8.7* 8.5*   Liver Function Tests:  Recent Labs Lab 10/11/14 1129  AST 30  ALT 15*  ALKPHOS 75  BILITOT 0.6  PROT 8.2*  ALBUMIN 3.7   No results for input(s): LIPASE, AMYLASE in the last 168 hours.  No results for input(s): AMMONIA in the last 168 hours. CBC:  Recent Labs Lab 10/11/14 1129  WBC 9.4  NEUTROABS 7.5  HGB 14.1  HCT 43.5  MCV 90.4  PLT 168   Cardiac Enzymes:   No results for input(s): CKTOTAL, CKMB, CKMBINDEX, TROPONINI in the last 168 hours. BNP (last 3 results) No results for input(s): BNP in the last 8760 hours.  ProBNP (last 3 results) No results for input(s): PROBNP in the last 8760 hours.  CBG: No results for input(s): GLUCAP in the last 168  hours.  Recent Results (from the past 240 hour(s))  MRSA PCR Screening     Status: Abnormal   Collection Time: 10/11/14  6:16 PM  Result Value Ref Range Status   MRSA by PCR POSITIVE (A) NEGATIVE Final    Comment:        The GeneXpert MRSA Assay (FDA approved for NASAL specimens only), is one component of a comprehensive MRSA colonization surveillance program. It is not intended to diagnose MRSA infection nor to guide or monitor treatment for MRSA infections. RESULT CALLED TO, READ BACK BY AND VERIFIED WITH: WILEY,N RN 2220 10/11/14 MITCHELL,L      Studies: Ct Hip Right Wo Contrast  10/11/2014   CLINICAL DATA:  Right thigh pain. The patient is unable to bear weight due to the pain.  EXAM: CT OF THE RIGHT HIP WITHOUT CONTRAST  TECHNIQUE: Multidetector CT imaging of the right hip was performed according to the standard protocol. Multiplanar CT image reconstructions were also generated.  COMPARISON:  Radiographs dated 10/11/2014  FINDINGS: There is no fracture or dislocation. The patient has moderately severe arthritis of the right hip with a small right hip effusion. There is an old avulsion from from the lesser trochanter.  The patient has severe spinal stenosis at L3-4 and at L4-5 with severe right foraminal stenosis at L4-5. There is also fairly severe spinal stenosis at L5-S1.  There is marked distention of the bladder almost to the top of the pelvis.  IMPRESSION: 1. No acute abnormality of the right hip. Moderately severe arthritis of the right hip. 2. Severe spinal stenosis at L3-4 and L4-5 and L5-S1 with severe right foraminal stenosis at L4-5. 3. Distended urinary bladder.   Electronically Signed   By: Francene Boyers M.D.   On: 10/11/2014 14:49    Scheduled Meds: . aspirin EC  81 mg Oral Daily  . clopidogrel  75 mg Oral Daily  . cyclobenzaprine  5 mg Oral TID  . dorzolamide  1 drop Right Eye q morning - 10a  . enoxaparin (LOVENOX) injection  40 mg Subcutaneous Q24H  . famotidine   10 mg Oral Daily  . finasteride  5 mg Oral Daily  . isosorbide mononitrate  30 mg Oral Daily  . latanoprost  1 drop Both Eyes QHS  . levothyroxine  75 mcg Oral QAC breakfast  . losartan  50 mg Oral Daily  . metoprolol succinate  25 mg Oral Daily  . simvastatin  40 mg Oral Daily  . tamsulosin  0.4 mg Oral Daily  . timolol  1 drop Both Eyes BID    Continuous Infusions: . sodium chloride 10 mL/hr at 10/11/14 1830     Time spent:  Keyon Winnick MD, PhD  [facility] Pager [phone]. If 7PM-7AM, please contact night-coverage at [email or web address], password [facility] 10/12/2014, 12:38 PM

## 2014-10-12 NOTE — Progress Notes (Signed)
Bladder scan result of 196. Pt stated he does not have the urge to void at this time. No c/o abdomen pain at this time. Will continue to monitor patient and report bladder scan result to on-coming RN.

## 2014-10-12 NOTE — Evaluation (Signed)
Physical Therapy Evaluation Patient Details Name: Jeremy [name] MRN: [id number] DOB: 1925-05-22 Today's Date: 10/12/2014   History of Present Illness  81 y.o. male , married, lives with spouse, independent of activities of daily living, PMH of HTN, hypothyroid, HLD, CAD status post stent, spinal stenosis with chronic low back pain, presented to the [facility] ED on 10/11/14 with complaints of acute onset of right thigh/groin pain and vomiting. Patient states that he was in his usual state of health when he went to bed and woke up at approximately 2 AM and noted right thigh/groin pain, severe and rated 10/10 in severity, sharp, nonradiating, worse with movement of right lower extremity or weightbearing and better with keeping it still. Pt s/p in and out cath with removal of 1800 mL urine with pt feeling almost immediately better.  Clinical Impression  Pt admitted with above diagnosis. Pt currently with functional limitations due to the deficits listed below (see PT Problem List).  Pt will benefit from skilled PT to increase their independence and safety with mobility to allow discharge to the venue listed below.  Pt ambulated in room without AD and verbalized that he did feel steadier with RW.  Recommend HHPT for home safety assessment due to decreased vision and pt being caregiver for wife and to work towards returning to [facility]. Grandson educated on how to adjust RW for pt's height.  Also reviewed managing stairs and counting steps due to decreased vision.     Follow Up Recommendations Home health PT    Equipment Recommendations  None recommended by PT    Recommendations for Other Services       Precautions / Restrictions Precautions Precautions: Fall Restrictions RLE Weight Bearing: Weight bearing as tolerated      Mobility  Bed Mobility Overal bed mobility: Needs Assistance Bed Mobility: Supine to Sit     Supine to sit: Min guard     General bed mobility comments: educated on how to  use L LE to A with R LE to get it off EOB.  Transfers Overall transfer level: Needs assistance Equipment used: Rolling walker (2 wheeled) Transfers: Sit to/from Stand Sit to Stand: Supervision         General transfer comment: cues for hand placement  Ambulation/Gait Ambulation/Gait assistance: Min guard Ambulation Distance (Feet): 150 Feet Assistive device: Rolling walker (2 wheeled) Gait Pattern/deviations: Step-through pattern;Decreased stance time - right     General Gait Details: Pt with fairly good cadence with gait.  In room ambulated without AD with IV pole and HHA with decreased steadiness.  Stairs Stairs:  (verbally reviewed sequence)          Wheelchair Mobility    Modified Rankin (Stroke Patients Only)       Balance Overall balance assessment: Needs assistance           Standing balance-Leahy Scale: Fair                               Pertinent Vitals/Pain Pain Assessment: Faces Faces Pain Scale: Hurts a little bit Pain Descriptors / Indicators: Sore Pain Intervention(s): Limited activity within patient's tolerance    Home Living Family/patient expects to be discharged to:: Private residence Living Arrangements: Spouse/significant other Available Help at Discharge: Family;Available 24 hours/day Type of Home: House Home Access: Stairs to enter Entrance Stairs-Rails: Left Entrance Stairs-Number of Steps: 3 Home Layout: Multi-level Home Equipment: Walker - 2 wheels;Cane - single point;Bedside commode;Shower seat  Additional Comments: decreased vision    Prior Function Level of Independence: Independent         Comments: Lives with wife who has dementia. Family checks on frequently. Daughters do breakfast and dinner for wife.     Hand Dominance        Extremity/Trunk Assessment   Upper Extremity Assessment: Overall WFL for tasks assessed           Lower Extremity Assessment: Overall WFL for tasks assessed;RLE  deficits/detail RLE Deficits / Details: 2+ to 3-/5  due to soreness    Cervical / Trunk Assessment: Normal  Communication   Communication: No difficulties  Cognition Arousal/Alertness: Awake/alert Behavior During Therapy: WFL for tasks assessed/performed Overall Cognitive Status: Within Functional Limits for tasks assessed                      General Comments General comments (skin integrity, edema, etc.): Grandson present    Exercises        Assessment/Plan    PT Assessment Patient needs continued PT services  PT Diagnosis Difficulty walking   PT Problem List Decreased activity tolerance;Decreased balance;Decreased mobility;Decreased knowledge of use of DME  PT Treatment Interventions Gait training;Stair training;Functional mobility training;Therapeutic activities;Therapeutic exercise;Balance training   PT Goals (Current goals can be found in the Care Plan section) Acute Rehab PT Goals Patient Stated Goal: go home PT Goal Formulation: With patient/family Time For Goal Achievement: 10/19/14 Potential to Achieve Goals: Good    Frequency Min 3X/week   Barriers to discharge        Co-evaluation               End of Session Equipment Utilized During Treatment: Gait belt Activity Tolerance: Patient tolerated treatment well Patient left: in chair;with family/visitor present Nurse Communication: Mobility status (aware pt up in chair with family present)    Functional Assessment Tool Used: clinical judgement and objective findings. Functional Limitation: Mobility: Walking and moving around Mobility: Walking and Moving Around Current Status [phone]): At least 1 percent but less than 20 percent impaired, limited or restricted Mobility: Walking and Moving Around Goal Status [phone]): At least 1 percent but less than 20 percent impaired, limited or restricted    Time: 0858-0928 PT Time Calculation (min) (ACUTE ONLY): 30 min   Charges:   PT  Evaluation $Initial PT Evaluation Tier I: 1 Procedure PT Treatments $Gait Training: 8-22 mins   PT G Codes:   PT G-Codes **NOT FOR INPATIENT CLASS** Functional Assessment Tool Used: clinical judgement and objective findings. Functional Limitation: Mobility: Walking and moving around Mobility: Walking and Moving Around Current Status [phone]): At least 1 percent but less than 20 percent impaired, limited or restricted Mobility: Walking and Moving Around Goal Status [phone]): At least 1 percent but less than 20 percent impaired, limited or restricted    [facility] LUBECK 10/12/2014, 9:48 AM

## 2014-10-13 DIAGNOSIS — I10 Essential (primary) hypertension: Secondary | ICD-10-CM | POA: Diagnosis not present

## 2014-10-13 DIAGNOSIS — M79651 Pain in right thigh: Secondary | ICD-10-CM | POA: Diagnosis not present

## 2014-10-13 DIAGNOSIS — R339 Retention of urine, unspecified: Secondary | ICD-10-CM | POA: Diagnosis not present

## 2014-10-13 DIAGNOSIS — H547 Unspecified visual loss: Secondary | ICD-10-CM | POA: Diagnosis not present

## 2014-10-13 LAB — BASIC METABOLIC PANEL
Anion gap: 13 (ref 5–15)
BUN: 21 mg/dL — AB (ref 6–20)
CHLORIDE: 104 mmol/L (ref 101–111)
CO2: 21 mmol/L — AB (ref 22–32)
CREATININE: 1.16 mg/dL (ref 0.61–1.24)
Calcium: 9 mg/dL (ref 8.9–10.3)
GFR calc Af Amer: >60 mL/min (ref >60)
GFR calc non Af Amer: 55 mL/min — ABNORMAL LOW (ref >60)
GLUCOSE: 103 mg/dL — AB (ref 65–99)
POTASSIUM: 3.7 mmol/L (ref 3.5–5.1)
SODIUM: 138 mmol/L (ref 135–145)

## 2014-10-13 LAB — PSA: PSA: 5.07 ng/mL — ABNORMAL HIGH (ref 0.00–4.00)

## 2014-10-13 MED ORDER — FINASTERIDE 5 MG PO TABS: 5.0000 mg | ORAL_TABLET | Freq: Every day | ORAL | Status: DC

## 2014-10-13 MED ORDER — TAMSULOSIN HCL 0.4 MG PO CAPS: 0.4000 mg | ORAL_CAPSULE | Freq: Every day | ORAL | Status: DC

## 2014-10-13 MED ORDER — MUPIROCIN 2 % EX OINT: TOPICAL_OINTMENT | Freq: Two times a day (BID) | CUTANEOUS | Status: DC

## 2014-10-13 MED ORDER — CHLORHEXIDINE GLUCONATE CLOTH 2 % EX PADS: 6.0000 | MEDICATED_PAD | Freq: Every day | CUTANEOUS | Status: DC

## 2014-10-13 NOTE — Progress Notes (Signed)
Physical Therapy Treatment Patient Details Name: Jeremy [name] MRN: [id number] DOB: 1925/02/06 Today's Date: 10/13/2014    History of Present Illness 81 y.o. male , married, lives with spouse, independent of activities of daily living, PMH of HTN, hypothyroid, HLD, CAD status post stent, spinal stenosis with chronic low back pain, presented to the [facility] ED on 10/11/14 with complaints of acute onset of right thigh/groin pain and vomiting. Patient states that he was in his usual state of health when he went to bed and woke up at approximately 2 AM and noted right thigh/groin pain, severe and rated 10/10 in severity, sharp, nonradiating, worse with movement of right lower extremity or weightbearing and better with keeping it still. Pt s/p in and out cath with removal of 1800 mL urine with pt feeling almost immediately better.    PT Comments    Progressing steadily, but gait not safe enough yet to be walking around the house alone.  May do better with RW, but often it is difficult to learn to use RW in same environment with low vision.   Follow Up Recommendations  Home health PT     Equipment Recommendations  None recommended by PT    Recommendations for Other Services       Precautions / Restrictions      Mobility  Bed Mobility Overal bed mobility: Needs Assistance Bed Mobility: Supine to Sit;Sit to Supine     Supine to sit: Min guard Sit to supine: Min guard      Transfers Overall transfer level: Needs assistance Equipment used: Rolling walker (2 wheeled) Transfers: Sit to/from Stand Sit to Stand: Min guard         General transfer comment: Cues for hand placement and technique   Ambulation/Gait Ambulation/Gait assistance: Min guard Ambulation Distance (Feet): 150 Feet Assistive device: Rolling walker (2 wheeled) Gait Pattern/deviations: Step-through pattern;Decreased stance time - right;Decreased stride length Gait velocity: slower   General Gait Details: pt  tended to get too close to the RW.  Mildly unsteady with the RW,?if due to low vision.   Stairs Stairs: Yes Stairs assistance: Min guard Stair Management: Two rails;Step to pattern;Forwards Number of Stairs: 3 General stair comments: Generally steady, but definite need for rails  Wheelchair Mobility    Modified Rankin (Stroke Patients Only)       Balance Overall balance assessment: Needs assistance   Sitting balance-Leahy Scale: Good     Standing balance support: Bilateral upper extremity supported;Single extremity supported Standing balance-Leahy Scale: Fair                      Cognition Arousal/Alertness: Awake/alert Behavior During Therapy: WFL for tasks assessed/performed Overall Cognitive Status: Within Functional Limits for tasks assessed                      Exercises      General Comments        Pertinent Vitals/Pain Pain Assessment: Faces Faces Pain Scale: Hurts little more Pain Location: R groin Pain Descriptors / Indicators: Sore Pain Intervention(s): Monitored during session    Home Living                      Prior Function            PT Goals (current goals can now be found in the care plan section) Acute Rehab PT Goals Patient Stated Goal: go home PT Goal Formulation: With patient/family Time For Goal  Achievement: 10/19/14 Potential to Achieve Goals: Good Progress towards PT goals: Progressing toward goals    Frequency  Min 3X/week    PT Plan Current plan remains appropriate    Co-evaluation             End of Session   Activity Tolerance: Patient tolerated treatment well Patient left: in bed;with call bell/phone within reach;with family/visitor present     Time: 1308-6578 PT Time Calculation (min) (ACUTE ONLY): 34 min  Charges:  $Gait Training: 8-22 mins $Therapeutic Activity: 8-22 mins                    G Codes:      Abimelec Grochowski, Eliseo Gum 10/13/2014, 2:39 PM  10/13/2014  Webber Bing, PT [phone]  (pager)

## 2014-10-13 NOTE — Progress Notes (Signed)
Unable to void thus shift. Verbal order per Dr. Roda Shutters for bladder scan performed, showing 900 mls. Dr. Roda Shutters notified

## 2014-10-13 NOTE — Discharge Summary (Signed)
Discharge Summary  Jeremy [name]:[id number] DOB: 1925/04/21  PCP: [name], MD  Admit date: 10/11/2014 Discharge date: 10/13/2014  Time spent: <70mins  Recommendations for Outpatient Follow-up:  1. F/u with PMD within a week for hospital follow up 2. F/u with urology Dr. Mena Goes on 10/18, for urinary retention, patient is discharged home with a foley. 3. Home health arranged.  Discharge Diagnoses:  Active Hospital Problems   Diagnosis Date Noted  . Acute pain of right thigh 10/11/2014  . Spinal stenosis   . Hypothyroidism 01/26/2014  . Coronary artery disease 10/21/2012  . Essential hypertension 10/21/2012  . Hyperlipidemia 10/21/2012    Resolved Hospital Problems   Diagnosis Date Noted Date Resolved  No resolved problems to display.    Discharge Condition: stable  Diet recommendation: heart healthy  Filed Weights   10/11/14 1045 10/11/14 1800  Weight: 165 lb (74.844 kg) 158 lb 6.4 oz (71.85 kg)    History of present illness:  Jeremy [name] is a 81 y.o. male , married, lives with spouse, independent of activities of daily living, PMH of HTN, hypothyroid, HLD, CAD status post stent, spinal stenosis with chronic low back pain, presented to the [facility] ED on 10/11/14 with complaints of acute onset of right thigh/groin pain and vomiting. Patient states that he was in his usual state of health when he went to bed last night. He woke up at approximately 2 AM and noted right thigh/groin pain, severe and rated 10/10 in severity, sharp, nonradiating, worse with movement of right lower extremity or weightbearing and better with keeping it still. He denies fall or trauma. He denies any unusual or heavy activity prior to this. He denies tingling, numbness or weakness in that extremity. He also had 2 episodes of nonbloody emesis without abdominal pain. Last BM 2 days ago. Passing flatus. Did not eat anything unusual. No nausea or fevers. Denies urinary difficulties or dysuria.?  Recently treated for urinary tract infection. As per family report to EDP, patient intermittently confused over several weeks but mental status normal in ED. Also complains of earache? Bilateral. In the ED, patient noted to be afebrile, mildly elevated blood pressures, lab workup unremarkable including urine microscopy which was negative and x-ray of the right hip and CT of the right hip reveal no fractures. When ED staff attempted to ambulate, patient was unable to move right leg due to pain and hence hospitalist admission was requested for further evaluation and management.  Hospital Course:  Principal Problem:   Acute pain of right thigh Active Problems:   Coronary artery disease   Essential hypertension   Hyperlipidemia   Hypothyroidism   Spinal stenosis  Urinary retention: Distended bladder per CT - required two in and out foley. Failed voiding trial, discharged home with foley and urology outpatient follow up - Started flomax/proscar - UA no infection, no blood   Mild cr elevation, acute renal injury from urinary retention, cr normalized 1.32-1.16.   Acute pain of right thigh/groin - CT and x-ray of right hip negative for fractures but show right hip arthritis - Unable to ambulate in ED secondary to significant pain and apparently independent at home - much improved after urinary retention resolved, still some mild tender and mild pain on ambulation, but walked with PT in the hallway with a walker,  home health.   Nausea and vomiting - 2 early this a.m.? Related to pain. -resolved. Tolerating regular diet and monitor. Continue home dose of H2 blocker  Essential hypertension -  Uncontrolled. Patient has not taken his medications today. Resume home medications and monitor -better  Hypothyroid - Continue Synthroid  Hyperlipidemia - Continue statins  CAD status post stent - Asymptomatic of chest pain. Continue aspirin, Plavix, statins, nitrates and beta blockers   Spinal  stenosis with chronic low back pain - Unchanged as per patient -PT and home health  Macular degeneration/baseline poor vision.   Intermittent confusion - Reported by family. Patient is not oriented to time but to person/place and to situation.  - ? Baseline dementia Outpatient neurology follow up  MRSA colonization: bactroban ointment, chlorhexidine cloth decolonization and contact precaution.   Code Status: Full  Family Communication: Discussed with grandson at bedside and daughter on the phone Disposition Plan: DC home with home health and Foley 10/11   Consultants:  none  Procedures:  In and out of foley x2  Discharged home with foley  Antibiotics:  none  Discharge Exam: BP 135/76 mmHg  Pulse 74  Temp(Src) 98.3 F (36.8 C) (Oral)  Resp 18  Ht 6' (1.829 m)  Wt 158 lb 6.4 oz (71.85 kg)  BMI 21.48 kg/m2  SpO2 100%   General: NAD  Cardiovascular: RRR  Respiratory: CTABL  Abdomen: Soft/ND/NT, positive BS  Musculoskeletal: No Edema  Neuro: not oriented to time, but to place/person and situation. Likely baseline. No focal neurological deficit.   Discharge Instructions You were cared for by a hospitalist during your hospital stay. If you have any questions about your discharge medications or the care you received while you were in the hospital after you are discharged, you can call the unit and asked to speak with the hospitalist on call if the hospitalist that took care of you is not available. Once you are discharged, your primary care physician will handle any further medical issues. Please note that NO REFILLS for any discharge medications will be authorized once you are discharged, as it is imperative that you return to your primary care physician (or establish a relationship with a primary care physician if you do not have one) for your aftercare needs so that they can reassess your need for medications and monitor your lab values.  Discharge  Instructions    Diet - low sodium heart healthy    Complete by:  As directed      Face-to-face encounter (required for Medicare/Medicaid patients)    Complete by:  As directed   I Quint Chestnut certify that this patient is under my care and that I, or a nurse practitioner or physician's assistant working with me, had a face-to-face encounter that meets the physician face-to-face encounter requirements with this patient on 10/12/2014. The encounter with the patient was in whole, or in part for the following medical condition(s) which is the primary reason for home health care (List medical condition): FTT  The encounter with the patient was in whole, or in part, for the following medical condition, which is the primary reason for home health care:  FTT  I certify that, based on my findings, the following services are medically necessary home health services:  Physical therapy  Reason for Medically Necessary Home Health Services:  Skilled Nursing- Change/Decline in Patient Status  My clinical findings support the need for the above services:  Pain interferes with ambulation/mobility  Further, I certify that my clinical findings support that this patient is homebound due to:  Shortness of Breath with activity     Home Health    Complete by:  As directed   To provide  the following care/treatments:  PT     Increase activity slowly    Complete by:  As directed             Medication List    TAKE these medications        aspirin EC 81 MG tablet  Take 81 mg by mouth daily.     Chlorhexidine Gluconate Cloth 2 % Pads  Apply 6 each topically daily at 6 (six) AM.     clopidogrel 75 MG tablet  Commonly known as:  PLAVIX  Take 1 tablet (75 mg total) by mouth daily.     dorzolamide 2 % ophthalmic solution  Commonly known as:  TRUSOPT  Place 1 drop into the right eye every morning.     finasteride 5 MG tablet  Commonly known as:  PROSCAR  Take 1 tablet (5 mg total) by mouth daily.     isosorbide  mononitrate 30 MG 24 hr tablet  Commonly known as:  IMDUR  Take 1 tablet (30 mg total) by mouth daily.     latanoprost 0.005 % ophthalmic solution  Commonly known as:  XALATAN  Place 1 drop into both eyes at bedtime.     levothyroxine 75 MCG tablet  Commonly known as:  SYNTHROID, LEVOTHROID  Take 75 mcg by mouth daily before breakfast.     losartan 50 MG tablet  Commonly known as:  COZAAR  50 mg daily.     metoprolol succinate 50 MG 24 hr tablet  Commonly known as:  TOPROL-XL  Take 25 mg by mouth daily. Take with or immediately following a meal.     mupirocin ointment 2 %  Commonly known as:  BACTROBAN  Place into the nose 2 (two) times daily.     ranitidine 75 MG tablet  Commonly known as:  ZANTAC  Take 75 mg by mouth daily.     simvastatin 40 MG tablet  Commonly known as:  ZOCOR  Take 40 mg by mouth daily.     tamsulosin 0.4 MG Caps capsule  Commonly known as:  FLOMAX  Take 1 capsule (0.4 mg total) by mouth daily.     timolol 0.5 % ophthalmic solution  Commonly known as:  TIMOPTIC  Place 1 drop into both eyes 2 (two) times daily. Only at bedtime patient places 1 drop in right eye       Allergies  Allergen Reactions  . Tramadol Nausea And Vomiting       Follow-up Information    Follow up with ESKRIDGE, MATTHEW, MD In 1 week.   Specialty:  Urology   Why:  urinary retention   Contact information:   [address] [place] [address] [phone]       Follow up with [name], MD In 1 week.   Specialty:  Internal Medicine   Why:  hospital discharge follow up, repeat bmp at follow up to monitor renal function, consider refer to neurology for dementia   Contact information:   [address] [place] [address] [phone]        The results of significant diagnostics from this hospitalization (including imaging, microbiology, ancillary and laboratory) are listed below for reference.    Significant Diagnostic Studies: Ct Hip  Right Wo Contrast  10/11/2014   CLINICAL DATA:  Right thigh pain. The patient is unable to bear weight due to the pain.  EXAM: CT OF THE RIGHT HIP WITHOUT CONTRAST  TECHNIQUE: Multidetector CT imaging of the right hip  was performed according to the standard protocol. Multiplanar CT image reconstructions were also generated.  COMPARISON:  Radiographs dated 10/11/2014  FINDINGS: There is no fracture or dislocation. The patient has moderately severe arthritis of the right hip with a small right hip effusion. There is an old avulsion from from the lesser trochanter.  The patient has severe spinal stenosis at L3-4 and at L4-5 with severe right foraminal stenosis at L4-5. There is also fairly severe spinal stenosis at L5-S1.  There is marked distention of the bladder almost to the top of the pelvis.  IMPRESSION: 1. No acute abnormality of the right hip. Moderately severe arthritis of the right hip. 2. Severe spinal stenosis at L3-4 and L4-5 and L5-S1 with severe right foraminal stenosis at L4-5. 3. Distended urinary bladder.   Electronically Signed   By: Francene Boyers M.D.   On: 10/11/2014 14:49   Dg Hip Unilat With Pelvis 2-3 Views Right  10/11/2014   CLINICAL DATA:  Right hip pain for 4 days without known injury. Difficulty ambulating.  EXAM: DG HIP (WITH OR WITHOUT PELVIS) 2-3V RIGHT  COMPARISON:  None.  FINDINGS: No acute fracture or dislocation is identified. There is evidence of moderate osteoarthritis involving the hip joint with joint space narrowing and proliferative changes present. Heterotopic bone formation is noted adjacent to the lesser trochanter, potentially secondary to prior trauma. The bony pelvis is intact and shows no evidence of fracture or diastasis. Soft tissues are unremarkable. No bony lesions or destruction identified.  IMPRESSION: Moderate osteoarthritis of the right hip joint. Heterotopic bone adjacent to the lesser trochanter, potentially secondary to prior trauma. No acute fracture or  dislocation.   Electronically Signed   By: Irish Lack M.D.   On: 10/11/2014 12:08    Microbiology: Recent Results (from the past 240 hour(s))  MRSA PCR Screening     Status: Abnormal   Collection Time: 10/11/14  6:16 PM  Result Value Ref Range Status   MRSA by PCR POSITIVE (A) NEGATIVE Final    Comment:        The GeneXpert MRSA Assay (FDA approved for NASAL specimens only), is one component of a comprehensive MRSA colonization surveillance program. It is not intended to diagnose MRSA infection nor to guide or monitor treatment for MRSA infections. RESULT CALLED TO, READ BACK BY AND VERIFIED WITH: [facility] RN 2220 10/11/14 MITCHELL,L      Labs: Basic Metabolic Panel:  Recent Labs Lab 10/11/14 1129 10/12/14 0458 10/13/14 0628  NA 135 137 138  K 3.7 3.5 3.7  CL 103 103 104  CO2 19* 23 21*  GLUCOSE 105* 115* 103*  BUN 14 16 21*  CREATININE 1.06 1.32* 1.16  CALCIUM 8.7* 8.5* 9.0   Liver Function Tests:  Recent Labs Lab 10/11/14 1129  AST 30  ALT 15*  ALKPHOS 75  BILITOT 0.6  PROT 8.2*  ALBUMIN 3.7   No results for input(s): LIPASE, AMYLASE in the last 168 hours. No results for input(s): AMMONIA in the last 168 hours. CBC:  Recent Labs Lab 10/11/14 1129  WBC 9.4  NEUTROABS 7.5  HGB 14.1  HCT 43.5  MCV 90.4  PLT 168   Cardiac Enzymes: No results for input(s): CKTOTAL, CKMB, CKMBINDEX, TROPONINI in the last 168 hours. BNP: BNP (last 3 results) No results for input(s): BNP in the last 8760 hours.  ProBNP (last 3 results) No results for input(s): PROBNP in the last 8760 hours.  CBG: No results for input(s): GLUCAP in the last  168 hours.     SignedAlbertine Grates MD, PhD  [facility] 10/13/2014, 10:24 AM

## 2014-10-14 LAB — HEMOGLOBIN A1C
Hgb A1c MFr Bld: 6.4 % — ABNORMAL HIGH (ref 4.8–5.6)
MEAN PLASMA GLUCOSE: 137 mg/dL

## 2014-10-20 ENCOUNTER — Encounter: Payer: Self-pay | Admitting: Cardiovascular Disease

## 2014-10-20 ENCOUNTER — Ambulatory Visit (INDEPENDENT_AMBULATORY_CARE_PROVIDER_SITE_OTHER): Payer: Medicare Other | Admitting: Cardiovascular Disease

## 2014-10-20 VITALS — BP 158/92 | HR 67 | Ht 71.0 in | Wt 158.9 lb

## 2014-10-20 DIAGNOSIS — I2510 Atherosclerotic heart disease of native coronary artery without angina pectoris: Secondary | ICD-10-CM | POA: Diagnosis not present

## 2014-10-20 DIAGNOSIS — I10 Essential (primary) hypertension: Secondary | ICD-10-CM

## 2014-10-20 DIAGNOSIS — E785 Hyperlipidemia, unspecified: Secondary | ICD-10-CM

## 2014-10-20 DIAGNOSIS — I739 Peripheral vascular disease, unspecified: Secondary | ICD-10-CM

## 2014-10-20 NOTE — Assessment & Plan Note (Signed)
History of hyperlipidemia on some statin 40 mg a day. We  will recheck a lipid and liver profile

## 2014-10-20 NOTE — Assessment & Plan Note (Signed)
History of hypertension blood pressure measured at 152/92.  He is on losartan and metoprolol. Continue current meds at current dosing

## 2014-10-20 NOTE — Patient Instructions (Signed)
Medication Instructions:  Your physician recommends that you continue on your current medications as directed. Please refer to the Current Medication list given to you today.   Labwork: Your physician recommends that you return for lab work in: FA[employer], [employer]ellery(BMET, LIPID/LIVER) The lab can be found on the FIRST FLOOR of out building in [address]   Testing/Procedures: none  Follow-Up: Your physician wants you to follow-up in: 12 MONTH WITH DR. Allyson SabalBERRY. You will receive a reminder letter in the mail two months in advance. If you don't receive a letter, please call our office to schedule the follow-up appointment.   Any Other Special Instructions Will Be Listed Below (If Applicable).

## 2014-10-20 NOTE — Assessment & Plan Note (Signed)
History of coronary artery disease status post non-ST segment elevation myocardial infarction February 2004 treated with stenting of the circumflex using a Cypher drug-eluting stent. At that time he had 50% distal RCA lesion and normal LV function. He also had a 40% right renal artery stenosis. He denies chest pain or shortness of breath.

## 2014-10-20 NOTE — Progress Notes (Signed)
10/20/2014 Jeremy [name]   09-11-1925  [id number]  Primary Physician Gwynneth AlimentSANDERS,ROBYN N, MD Primary Cardiologist: Runell GessJonathan J. Valoria Tamburri MD Roseanne RenoFACP,FACC,FAHA, FSCAI   HPI:  The patient is a very pleasant 81 year old, thin-appearing, married [place] male, father of 4, grandfather to 4 grandchildren who I last saw one year ago. He has a history of CAD status post non-ST-segment-elevation myocardial infarction February 2004 treated with stenting of a circumflex using a Cypher drug-eluting stent. At that time, he had a 50% distal RCA lesion and normal LV function. He also had a 40% right renal artery stenosis. He denies chest pain or shortness of breath. Dr. Renae GlossShelton has been following his lipid profile. He was hospitalized a year ago with abdominal pain, was found to have a nonbleeding duodenal ulcer and a small hiatal hernia. His Aleve was stopped.  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 He also complains of bilateral calf claudication.I did perform a lower extremity arterial Doppler studies which were normal. Since I saw him a year ago he's remained clinically stable denying chest pain or shortness of breath.   Current Outpatient Prescriptions  Medication Sig Dispense Refill  . aspirin EC 81 MG tablet Take 81 mg by mouth daily.    . Chlorhexidine Gluconate Cloth 2 % PADS Apply 6 each topically daily at 6 (six) AM. 30 each 0  . clopidogrel (PLAVIX) 75 MG tablet Take 1 tablet (75 mg total) by mouth daily. 90 tablet 0  . dorzolamide (TRUSOPT) 2 % ophthalmic solution Place 1 drop into the right eye every morning.     . finasteride (PROSCAR) 5 MG tablet Take 1 tablet (5 mg total) by mouth daily. 30 tablet 0  . isosorbide mononitrate (IMDUR) 30 MG 24 hr tablet Take 1 tablet (30 mg total) by mouth daily. 90 tablet 3  . latanoprost (XALATAN) 0.005 % ophthalmic solution Place 1 drop into both eyes at bedtime.    Marland Kitchen. levothyroxine (SYNTHROID, LEVOTHROID) 75 MCG tablet Take  75 mcg by mouth daily before breakfast.    . losartan (COZAAR) 50 MG tablet 50 mg daily.     . metoprolol succinate (TOPROL-XL) 50 MG 24 hr tablet Take 25 mg by mouth daily. Take with or immediately following a meal.    . mupirocin ointment (BACTROBAN) 2 % Place into the nose 2 (two) times daily. 22 g 0  . ranitidine (ZANTAC) 75 MG tablet Take 75 mg by mouth daily.    . simvastatin (ZOCOR) 40 MG tablet Take 40 mg by mouth daily.    . tamsulosin (FLOMAX) 0.4 MG CAPS capsule Take 1 capsule (0.4 mg total) by mouth daily. 30 capsule 0  . timolol (TIMOPTIC) 0.5 % ophthalmic solution Place 1 drop into both eyes 2 (two) times daily. Only at bedtime patient places 1 drop in right eye     No current facility-administered medications for this visit.    Allergies  Allergen Reactions  . Tramadol Nausea And Vomiting    Social History   Social History  . Marital Status: Married    Spouse Name: N/A  . Number of Children: N/A  . Years of Education: N/A   Occupational History  . Not on file.   Social History Main Topics  . Smoking status: Former Games developermoker  . Smokeless tobacco: Never Used  . Alcohol Use: No  . Drug Use: No  . Sexual Activity: Not on file   Other Topics Concern  . Not on file  Social History Narrative     Review of Systems: General: negative for chills, fever, night sweats or weight changes.  Cardiovascular: negative for chest pain, dyspnea on exertion, edema, orthopnea, palpitations, paroxysmal nocturnal dyspnea or shortness of breath Dermatological: negative for rash Respiratory: negative for cough or wheezing Urologic: negative for hematuria Abdominal: negative for nausea, vomiting, diarrhea, bright red blood per rectum, melena, or hematemesis Neurologic: negative for visual changes, syncope, or dizziness All other systems reviewed and are otherwise negative except as noted above.    Blood pressure 158/92, pulse 67, height  (1.803 m), weight 158 lb 14.4 oz  (72.077 kg).  General appearance: alert and no distress Neck: no adenopathy, no carotid bruit, no JVD, supple, symmetrical, trachea midline and thyroid not enlarged, symmetric, no tenderness/mass/nodules Lungs: clear to auscultation bilaterally Heart: regular rate and rhythm, S1, S2 normal, no murmur, click, rub or gallop Extremities: extremities normal, atraumatic, no cyanosis or edema  EKG normal sinus rhythm at 67 with left axis deviation and septal Q waves. I personally reviewed this EKG  ASSESSMENT AND PLAN:   Hyperlipidemia History of hyperlipidemia on some statin 40 mg a day. We  will recheck a lipid and liver profile  Essential hypertension History of hypertension blood pressure measured at 152/92.  He is on losartan and metoprolol. Continue current meds at current dosing  Coronary artery disease History of coronary artery disease status post non-ST segment elevation myocardial infarction February 2004 treated with stenting of the circumflex using a Cypher drug-eluting stent. At that time he had 50% distal RCA lesion and normal LV function. He also had a 40% right renal artery stenosis. He denies chest pain or shortness of breath.      Runell Gess MD FACP,FACC,FAHA, [facility] 10/20/2014 3:56 PM

## 2014-10-22 ENCOUNTER — Emergency Department (HOSPITAL_COMMUNITY): Payer: Medicare Other

## 2014-10-22 ENCOUNTER — Encounter (HOSPITAL_COMMUNITY): Payer: Self-pay | Admitting: Emergency Medicine

## 2014-10-22 ENCOUNTER — Emergency Department (HOSPITAL_COMMUNITY)
Admission: EM | Admit: 2014-10-22 | Discharge: 2014-10-22 | Disposition: A | Discharge status: Home / Self Care | Payer: Medicare Other | Attending: Emergency Medicine | Admitting: Emergency Medicine

## 2014-10-22 DIAGNOSIS — E079 Disorder of thyroid, unspecified: Secondary | ICD-10-CM | POA: Diagnosis not present

## 2014-10-22 DIAGNOSIS — Z792 Long term (current) use of antibiotics: Secondary | ICD-10-CM | POA: Diagnosis not present

## 2014-10-22 DIAGNOSIS — Z7902 Long term (current) use of antithrombotics/antiplatelets: Secondary | ICD-10-CM | POA: Diagnosis not present

## 2014-10-22 DIAGNOSIS — Z8739 Personal history of other diseases of the musculoskeletal system and connective tissue: Secondary | ICD-10-CM | POA: Diagnosis not present

## 2014-10-22 DIAGNOSIS — Z8719 Personal history of other diseases of the digestive system: Secondary | ICD-10-CM | POA: Insufficient documentation

## 2014-10-22 DIAGNOSIS — Z7982 Long term (current) use of aspirin: Secondary | ICD-10-CM | POA: Diagnosis not present

## 2014-10-22 DIAGNOSIS — E785 Hyperlipidemia, unspecified: Secondary | ICD-10-CM | POA: Diagnosis not present

## 2014-10-22 DIAGNOSIS — Z87891 Personal history of nicotine dependence: Secondary | ICD-10-CM | POA: Diagnosis not present

## 2014-10-22 DIAGNOSIS — Z8669 Personal history of other diseases of the nervous system and sense organs: Secondary | ICD-10-CM | POA: Diagnosis not present

## 2014-10-22 DIAGNOSIS — N390 Urinary tract infection, site not specified: Secondary | ICD-10-CM | POA: Diagnosis not present

## 2014-10-22 DIAGNOSIS — Z9861 Coronary angioplasty status: Secondary | ICD-10-CM | POA: Diagnosis not present

## 2014-10-22 DIAGNOSIS — Z79899 Other long term (current) drug therapy: Secondary | ICD-10-CM | POA: Insufficient documentation

## 2014-10-22 DIAGNOSIS — I10 Essential (primary) hypertension: Secondary | ICD-10-CM | POA: Insufficient documentation

## 2014-10-22 DIAGNOSIS — I2510 Atherosclerotic heart disease of native coronary artery without angina pectoris: Secondary | ICD-10-CM | POA: Insufficient documentation

## 2014-10-22 DIAGNOSIS — R4182 Altered mental status, unspecified: Secondary | ICD-10-CM | POA: Diagnosis present

## 2014-10-22 LAB — URINALYSIS, ROUTINE W REFLEX MICROSCOPIC
Bilirubin Urine: NEGATIVE
Glucose, UA: NEGATIVE mg/dL
Ketones, ur: NEGATIVE mg/dL
Nitrite: NEGATIVE
Protein, ur: NEGATIVE mg/dL
Specific Gravity, Urine: 1.006 (ref 1.005–1.030)
Urobilinogen, UA: 1 mg/dL (ref 0.0–1.0)
pH: 7 (ref 5.0–8.0)

## 2014-10-22 LAB — COMPREHENSIVE METABOLIC PANEL
ALK PHOS: 78 U/L (ref 38–126)
ALT: 15 U/L — AB (ref 17–63)
AST: 25 U/L (ref 15–41)
Albumin: 3.4 g/dL — ABNORMAL LOW (ref 3.5–5.0)
Anion gap: 7 (ref 5–15)
BUN: 9 mg/dL (ref 6–20)
CALCIUM: 9.6 mg/dL (ref 8.9–10.3)
CHLORIDE: 107 mmol/L (ref 101–111)
CO2: 25 mmol/L (ref 22–32)
CREATININE: 1.01 mg/dL (ref 0.61–1.24)
GFR calc non Af Amer: >60 mL/min (ref >60)
Glucose, Bld: 96 mg/dL (ref 65–99)
Potassium: 3.8 mmol/L (ref 3.5–5.1)
SODIUM: 139 mmol/L (ref 135–145)
Total Bilirubin: 0.7 mg/dL (ref 0.3–1.2)
Total Protein: 7.5 g/dL (ref 6.5–8.1)

## 2014-10-22 LAB — CBC
HCT: 41.1 % (ref 39.0–52.0)
Hemoglobin: 13.5 g/dL (ref 13.0–17.0)
MCH: 29.2 pg (ref 26.0–34.0)
MCHC: 32.8 g/dL (ref 30.0–36.0)
MCV: 89 fL (ref 78.0–100.0)
PLATELETS: 274 10*3/uL (ref 150–400)
RBC: 4.62 MIL/uL (ref 4.22–5.81)
RDW: 14.6 % (ref 11.5–15.5)
WBC: 8.9 10*3/uL (ref 4.0–10.5)

## 2014-10-22 LAB — URINE MICROSCOPIC-ADD ON

## 2014-10-22 LAB — CBG MONITORING, ED: Glucose-Capillary: 80 mg/dL (ref 65–99)

## 2014-10-22 NOTE — ED Notes (Signed)
BIB daughter, recent admission, difficulty with speech, memory X several weeks, alert, oriented X2, no pain, NAD

## 2014-10-22 NOTE — ED Provider Notes (Signed)
CSN: [id number]     Arrival date & time 10/22/14  1234 History   First MD Initiated Contact with Patient 10/22/14 1316     Chief Complaint  Patient presents with  . Altered Mental Status     (Consider location/radiation/quality/duration/timing/severity/associated sxs/prior Treatment) HPI Comments: Patient here complaining of altered mental status times several weeks. The daughter states that the symptoms have been progressive and wax and wane. Described as confusion without focal weakness. No recent medication changes. Was recently diagnosed with a UTI and symptoms have not improved with treatment. No fever or chills. Recent history of trauma. Denies any dizziness or headache. Abdominal chest pain. Symptoms resolve spontaneously and nothing makes them better.  Patient is a 81 y.o. male presenting with altered mental status. The history is provided by the patient and a relative.  Altered Mental Status   Past Medical History  Diagnosis Date  . Bleeding ulcer   . Spinal stenosis   . Thyroid disease   . Hypertension   . Coronary artery disease     non-ST segment elevation myocardial infarction February of 2004 she was stenting of the circumflex using a Cypher drug-eluting stent.  . Hyperlipidemia   . Claudication (HCC)   . Macular degeneration    Past Surgical History  Procedure Laterality Date  . Coronary angioplasty with stent placement    . Thyroidectomy, partial    . Renal doppler  08/17/2005    Normal evaluation  . Cardiac catheterization  02/25/2002    Proximal L Circumflex 95% lesion, stented w/ 3x18-mm CYPHER stent avoiding the 2 L Marginal branch, jailing the 1st marginal, resulting in reduction of a 90-95% lesion to 0% residual  . Cardiovascular stress test  12/18/2006    EKG negative for ischemia, no significant ischemia demonstrated.  . Transthoracic echocardiogram  03/13/2002    EF >55%, moderate LVH,   . Back surgery     Family History  Problem Relation Age of Onset   . Family history unknown: Yes   Social History  Substance Use Topics  . Smoking status: Former Games developer  . Smokeless tobacco: Never Used  . Alcohol Use: No    Review of Systems  All other systems reviewed and are negative.     Allergies  Tramadol  Home Medications   Prior to Admission medications   Medication Sig Start Date End Date Taking? Authorizing Provider  aspirin EC 81 MG tablet Take 81 mg by mouth daily.    Historical Provider, MD  Chlorhexidine Gluconate Cloth 2 % PADS Apply 6 each topically daily at 6 (six) AM. 10/13/14   Albertine Grates, MD  clopidogrel (PLAVIX) 75 MG tablet Take 1 tablet (75 mg total) by mouth daily. 07/31/14   Runell Gess, MD  dorzolamide (TRUSOPT) 2 % ophthalmic solution Place 1 drop into the right eye every morning.  08/23/12   Historical Provider, MD  finasteride (PROSCAR) 5 MG tablet Take 1 tablet (5 mg total) by mouth daily. 10/13/14   Albertine Grates, MD  isosorbide mononitrate (IMDUR) 30 MG 24 hr tablet Take 1 tablet (30 mg total) by mouth daily. 12/01/13   Runell Gess, MD  latanoprost (XALATAN) 0.005 % ophthalmic solution Place 1 drop into both eyes at bedtime.    Historical Provider, MD  levothyroxine (SYNTHROID, LEVOTHROID) 75 MCG tablet Take 75 mcg by mouth daily before breakfast.    Historical Provider, MD  losartan (COZAAR) 50 MG tablet 50 mg daily.  10/08/12   Historical Provider, MD  metoprolol  succinate (TOPROL-XL) 50 MG 24 hr tablet Take 25 mg by mouth daily. Take with or immediately following a meal.    Historical Provider, MD  mupirocin ointment (BACTROBAN) 2 % Place into the nose 2 (two) times daily. 10/13/14   Albertine GratesFang Xu, MD  ranitidine (ZANTAC) 75 MG tablet Take 75 mg by mouth daily.    Historical Provider, MD  simvastatin (ZOCOR) 40 MG tablet Take 40 mg by mouth daily.    Historical Provider, MD  tamsulosin (FLOMAX) 0.4 MG CAPS capsule Take 1 capsule (0.4 mg total) by mouth daily. 10/13/14   Albertine GratesFang Xu, MD  timolol (TIMOPTIC) 0.5 % ophthalmic  solution Place 1 drop into both eyes 2 (two) times daily. Only at bedtime patient places 1 drop in right eye 09/18/12   Historical Provider, MD   BP 170/104 mmHg  Pulse 81  Temp(Src) 97.8 F (36.6 C) (Oral)  Resp 14  SpO2 97% Physical Exam  Constitutional: He appears well-developed and well-nourished.  HENT:  Head: Normocephalic and atraumatic.  Eyes: Conjunctivae and EOM are normal. Pupils are equal, round, and reactive to light.  Neck: Normal range of motion. Neck supple.  Cardiovascular: Normal rate and regular rhythm.   Pulmonary/Chest: Effort normal and breath sounds normal.  Abdominal: Soft.  Musculoskeletal: Normal range of motion.  Neurological: He is alert. No cranial nerve deficit. Coordination normal.  Skin: Skin is warm and dry.  Psychiatric: He has a normal mood and affect.  Nursing note and vitals reviewed.   ED Course  Procedures (including critical care time) Labs Review Labs Reviewed  COMPREHENSIVE METABOLIC PANEL  CBC  URINALYSIS, ROUTINE W REFLEX MICROSCOPIC (NOT AT [facility])  CBG MONITORING, ED    Imaging Review No results found. I have personally reviewed and evaluated these images and lab results as part of my medical decision-making.   EKG Interpretation None      MDM   Final diagnoses:  None    Patient's lab results and CAT scan reviewed with him. Patient currently on a course of antibiotics for his UTI and was instructed to continue this. Will follow with his doctor. No acute neurological deficits at time of discharge.    Lorre NickAnthony Shelonda Saxe, MD 10/22/14 [phone]

## 2014-10-22 NOTE — Discharge Instructions (Signed)
Continue on your current medications for your urinary tract infection  Urinary Tract Infection Urinary tract infections (UTIs) can develop anywhere along your urinary tract. Your urinary tract is your body's drainage system for removing wastes and extra water. Your urinary tract includes two kidneys, two ureters, a bladder, and a urethra. Your kidneys are a pair of bean-shaped organs. Each kidney is about the size of your fist. They are located below your ribs, one on each side of your spine. CAUSES Infections are caused by microbes, which are microscopic organisms, including fungi, viruses, and bacteria. These organisms are so small that they can only be seen through a microscope. Bacteria are the microbes that most commonly cause UTIs. SYMPTOMS  Symptoms of UTIs may vary by age and gender of the patient and by the location of the infection. Symptoms in young women typically include a frequent and intense urge to urinate and a painful, burning feeling in the bladder or urethra during urination. Older women and men are more likely to be tired, shaky, and weak and have muscle aches and abdominal pain. A fever may mean the infection is in your kidneys. Other symptoms of a kidney infection include pain in your back or sides below the ribs, nausea, and vomiting. DIAGNOSIS To diagnose a UTI, your caregiver will ask you about your symptoms. Your caregiver will also ask you to provide a urine sample. The urine sample will be tested for bacteria and white blood cells. White blood cells are made by your body to help fight infection. TREATMENT  Typically, UTIs can be treated with medication. Because most UTIs are caused by a bacterial infection, they usually can be treated with the use of antibiotics. The choice of antibiotic and length of treatment depend on your symptoms and the type of bacteria causing your infection. HOME CARE INSTRUCTIONS  If you were prescribed antibiotics, take them exactly as your  caregiver instructs you. Finish the medication even if you feel better after you have only taken some of the medication.  Drink enough water and fluids to keep your urine clear or pale yellow.  Avoid caffeine, tea, and carbonated beverages. They tend to irritate your bladder.  Empty your bladder often. Avoid holding urine for long periods of time.  Empty your bladder before and after sexual intercourse.  After a bowel movement, women should cleanse from front to back. Use each tissue only once. SEEK MEDICAL CARE IF:   You have back pain.  You develop a fever.  Your symptoms do not begin to resolve within 3 days. SEEK IMMEDIATE MEDICAL CARE IF:   You have severe back pain or lower abdominal pain.  You develop chills.  You have nausea or vomiting.  You have continued burning or discomfort with urination. MAKE SURE YOU:   Understand these instructions.  Will watch your condition.  Will get help right away if you are not doing well or get worse.   This information is not intended to replace advice given to you by your health care provider. Make sure you discuss any questions you have with your health care provider.   Document Released: 09/28/2004 Document Revised: 09/09/2014 Document Reviewed: 01/27/2011 Elsevier Interactive Patient Education [employer]2016 Elsevier Inc.

## 2014-10-30 ENCOUNTER — Other Ambulatory Visit: Payer: Self-pay | Admitting: Cardiovascular Disease

## 2014-11-02 ENCOUNTER — Ambulatory Visit (INDEPENDENT_AMBULATORY_CARE_PROVIDER_SITE_OTHER): Payer: Medicare Other | Admitting: Neurology

## 2014-11-02 ENCOUNTER — Encounter: Payer: Self-pay | Admitting: Neurology

## 2014-11-02 VITALS — BP 150/83 | HR 74 | Ht 71.0 in | Wt 152.5 lb

## 2014-11-02 DIAGNOSIS — R269 Unspecified abnormalities of gait and mobility: Secondary | ICD-10-CM

## 2014-11-02 DIAGNOSIS — R410 Disorientation, unspecified: Secondary | ICD-10-CM | POA: Diagnosis not present

## 2014-11-02 DIAGNOSIS — R339 Retention of urine, unspecified: Secondary | ICD-10-CM

## 2014-11-02 NOTE — Progress Notes (Signed)
PATIENT: Jeremy [name] DOB: 1925/04/08  Chief Complaint  Patient presents with  . Altered Mental Status    MMSE 14/27 - 7 animals.  He is here with  his daughter, Stanton Kidney.  He has recently become more confused, stuttering, having difficulty with word finding and memory problems.  He has been to the hospital multiple times.  He is also having a problem with his bladder not emptying completely and he currently has an indwelling catheter.  Says a bladder infection has been ruled out.     HISTORICAL  Jeremy [name] a 81 years old right-handed male, seen in refer by  His primary care physician Dr. Velna Hatchet, accompanied by his daughter [name] in October 30 first 2016 for evaluation of memory trouble, confusion, difficulty with language  He gradudate from high school, worked at [employer], retired at 65, continue to be active, enjoyed traveling, bowling,was doing exercise classes regularly until end of September, he had fairly acute onset gait difficulty, urinary retention, was also found to be confused, difficulty talking,  He was admitted to hospital in October 9 to 11 2016 for right hip pain,lower abdominal pain, CAT scan showed right hip arthritis. I have personally reviewed CAT scan of brain, moderate periventricular small vessel disease, no acute lesions.  He is now back home with home rehabilitation, ambulate with a walker, had Foley catheter in, going through urology evaluation, he has lost both of his vision around 2013 due to glaucoma, macular degenerations, was highly function in with his visual loss, able to fix simple breakfast use microwave. But now he ambulate with a walker, unsteady stiff gait, needing more help in daily activity  REVIEW OF SYSTEMS: Full 14 system review of systems performed and notable only for constipation, urination problems, memory loss, confusion, numbness, weakness, tremor, decreased energy, change in appetite  ALLERGIES: Allergies  Allergen  Reactions  . Tramadol Nausea And Vomiting    HOME MEDICATIONS: Current Outpatient Prescriptions  Medication Sig Dispense Refill  . aspirin EC 81 MG tablet Take 81 mg by mouth daily.    . clopidogrel (PLAVIX) 75 MG tablet Take 1 tablet (75 mg total) by mouth daily. 90 tablet 3  . dorzolamide (TRUSOPT) 2 % ophthalmic solution Place 1 drop into the right eye every morning.     . finasteride (PROSCAR) 5 MG tablet Take 1 tablet (5 mg total) by mouth daily. 30 tablet 0  . isosorbide mononitrate (IMDUR) 30 MG 24 hr tablet Take 1 tablet (30 mg total) by mouth daily. 90 tablet 3  . latanoprost (XALATAN) 0.005 % ophthalmic solution Place 1 drop into both eyes at bedtime.    Marland Kitchen levothyroxine (SYNTHROID, LEVOTHROID) 75 MCG tablet Take 75 mcg by mouth daily before breakfast.    . losartan (COZAAR) 50 MG tablet 50 mg daily.     . metoprolol succinate (TOPROL-XL) 50 MG 24 hr tablet Take 25 mg by mouth daily. Take with or immediately following a meal.    . mupirocin ointment (BACTROBAN) 2 % Place into the nose 2 (two) times daily. 22 g 0  . ranitidine (ZANTAC) 75 MG tablet Take 75 mg by mouth daily.    . simvastatin (ZOCOR) 40 MG tablet Take 40 mg by mouth daily.    . timolol (TIMOPTIC) 0.5 % ophthalmic solution Place 1 drop into both eyes 2 (two) times daily. Only at bedtime patient places 1 drop in right eye     No current facility-administered medications for this visit.  PAST MEDICAL HISTORY: Past Medical History  Diagnosis Date  . Bleeding ulcer   . Spinal stenosis   . Thyroid disease   . Hypertension   . Coronary artery disease     non-ST segment elevation myocardial infarction February of 2004 she was stenting of the circumflex using a Cypher drug-eluting stent.  . Hyperlipidemia   . Claudication (HCC)   . Macular degeneration   . Altered mental status     PAST SURGICAL HISTORY: Past Surgical History  Procedure Laterality Date  . Coronary angioplasty with stent placement    .  Thyroidectomy, partial    . Renal doppler  08/17/2005    Normal evaluation  . Cardiac catheterization  02/25/2002    Proximal L Circumflex 95% lesion, stented w/ 3x18-mm CYPHER stent avoiding the 2 L Marginal branch, jailing the 1st marginal, resulting in reduction of a 90-95% lesion to 0% residual  . Cardiovascular stress test  12/18/2006    EKG negative for ischemia, no significant ischemia demonstrated.  . Transthoracic echocardiogram  03/13/2002    EF >55%, moderate LVH,   . Back surgery      FAMILY HISTORY: Family History  Problem Relation Age of Onset  . Family history unknown: Yes    SOCIAL HISTORY:  Social History   Social History  . Marital Status: Married    Spouse Name: N/A  . Number of Children: 4  . Years of Education: HS   Occupational History  . Retired    Social History Main Topics  . Smoking status: Former Games developer  . Smokeless tobacco: Never Used     Comment: Quit 50+ years ago.  . Alcohol Use: No  . Drug Use: No  . Sexual Activity: Not on file   Other Topics Concern  . Not on file   Social History Narrative   Lives with his wife.   Right-handed.   No caffeine use.     PHYSICAL EXAM   Filed Vitals:   11/02/14 1316  BP: 150/83  Pulse: 74  Height:  (1.803 m)  Weight: 152 lb 8 oz (69.174 kg)    Not recorded      Body mass index is 21.28 kg/(m^2).  PHYSICAL EXAMNIATION:  Gen: NAD, conversant, well nourised, obese, well groomed                     Cardiovascular: Regular rate rhythm, no peripheral edema, warm, nontender. Eyes: Conjunctivae clear without exudates or hemorrhage Neck: Supple, no carotid bruise. Pulmonary: Clear to auscultation bilaterally   NEUROLOGICAL EXAM:  MENTAL STATUS: Speech:    Speech is normal; fluent and spontaneous with normal comprehension.  Cognition: Mini-Mental Status Examination 14 out of 27, animal naming is 7     Orientation to time, place and person: He is not oriented to date, year, date,  season, Dr., state     Recent and remote memory: He missed 3 out of 3 recalls     Normal Attention span and concentration     Normal Language, naming, repeating,spontaneous speech     CRANIAL NERVES: CN II:  left pupil was round, right pupil was irregular minimum reactive to light, he could not count fingers CN III, IV, VI: extraocular movement are normal. No ptosis. CN V: Facial sensation is intact to pinprick in all 3 divisions bilaterally. Corneal responses are intact.  CN VII: Face is symmetric with normal eye closure and smile. CN VIII: Hearing is normal to rubbing fingers CN IX, X:  Palate elevates symmetrically. Phonation is normal. CN XI: Head turning and shoulder shrug are intact CN XII: Tongue is midline with normal movements and no atrophy.  MOTOR: There is no pronator drift of out-stretched arms. Muscle bulk and tone are normal. Muscle strength is normal.  REFLEXES: Reflexes are 3 and symmetric at the biceps, triceps, knees, and ankles. Plantar responses are flexor.  SENSORY: Length dependent decreased to light touch, pinprick and vibratory sensation to distal shin  COORDINATION: Rapid alternating movements and fine finger movements are intact. There is no dysmetria on finger-to-nose and heel-knee-shin.    GAIT/STANCE: He need to push up to get up from seated position, stiff, cautious, unsteady gait  DIAGNOSTIC DATA (LABS, IMAGING, TESTING) - I reviewed patient records, labs, notes, testing and imaging myself where available.   ASSESSMENT AND PLAN  Jeremy [name] is a 81 y.o. male   Acute onset of confusion, memory loss  He has vascular risk factor of aging,hypertension, hyperlipidemia, previous coronary artery disease  MRI of the brain to rule out stroke  Keep aspirin daily Bilateral blindness  Due to glaucoma, macular degeneration Gait difficulty, urinary retention  Hyperreflexia, need to rule out cervical spondylitic myelopathy  MRI cervical spine   Jeremy FeinsteinYijun  Sebastian [name], M.D. Ph.D.  [facility] [address], [address] [place], [place] [address] Ph: [phone]) [phone] Fax: [phone]  CC: Dorothyann Pengobyn Sanders

## 2014-11-03 ENCOUNTER — Telehealth: Payer: Self-pay | Admitting: Neurology

## 2014-11-03 ENCOUNTER — Emergency Department (HOSPITAL_COMMUNITY)
Admission: EM | Admit: 2014-11-03 | Discharge: 2014-11-04 | Disposition: A | Discharge status: Home / Self Care | Payer: Medicare Other | Source: Home / Self Care | Attending: Emergency Medicine | Admitting: Emergency Medicine

## 2014-11-03 ENCOUNTER — Encounter (HOSPITAL_COMMUNITY): Payer: Self-pay

## 2014-11-03 ENCOUNTER — Other Ambulatory Visit (INDEPENDENT_AMBULATORY_CARE_PROVIDER_SITE_OTHER): Payer: Self-pay

## 2014-11-03 DIAGNOSIS — N390 Urinary tract infection, site not specified: Secondary | ICD-10-CM | POA: Diagnosis not present

## 2014-11-03 DIAGNOSIS — Z0289 Encounter for other administrative examinations: Secondary | ICD-10-CM

## 2014-11-03 DIAGNOSIS — G9340 Encephalopathy, unspecified: Secondary | ICD-10-CM | POA: Diagnosis not present

## 2014-11-03 LAB — URINALYSIS, ROUTINE W REFLEX MICROSCOPIC
Bilirubin Urine: NEGATIVE
Glucose, UA: NEGATIVE mg/dL
Ketones, ur: NEGATIVE mg/dL
Nitrite: NEGATIVE
Protein, ur: NEGATIVE mg/dL
Specific Gravity, Urine: 1.004 — ABNORMAL LOW (ref 1.005–1.030)
Urobilinogen, UA: 1 mg/dL (ref 0.0–1.0)
pH: 6.5 (ref 5.0–8.0)

## 2014-11-03 LAB — COMPREHENSIVE METABOLIC PANEL
ALT: 13 U/L — ABNORMAL LOW (ref 17–63)
AST: 25 U/L (ref 15–41)
Albumin: 3.4 g/dL — ABNORMAL LOW (ref 3.5–5.0)
Alkaline Phosphatase: 85 U/L (ref 38–126)
Anion gap: 11 (ref 5–15)
BUN: 12 mg/dL (ref 6–20)
CO2: 24 mmol/L (ref 22–32)
Calcium: 9.1 mg/dL (ref 8.9–10.3)
Chloride: 100 mmol/L — ABNORMAL LOW (ref 101–111)
Creatinine, Ser: 1.1 mg/dL (ref 0.61–1.24)
GFR calc Af Amer: >60 mL/min (ref >60)
GFR calc non Af Amer: 58 mL/min — ABNORMAL LOW (ref >60)
Glucose, Bld: 92 mg/dL (ref 65–99)
Potassium: 3.6 mmol/L (ref 3.5–5.1)
Sodium: 135 mmol/L (ref 135–145)
Total Bilirubin: 0.6 mg/dL (ref 0.3–1.2)
Total Protein: 7.8 g/dL (ref 6.5–8.1)

## 2014-11-03 LAB — CBC
HCT: 42.6 % (ref 39.0–52.0)
Hemoglobin: 14.4 g/dL (ref 13.0–17.0)
MCH: 29.8 pg (ref 26.0–34.0)
MCHC: 33.8 g/dL (ref 30.0–36.0)
MCV: 88 fL (ref 78.0–100.0)
Platelets: 189 10*3/uL (ref 150–400)
RBC: 4.84 MIL/uL (ref 4.22–5.81)
RDW: 14.5 % (ref 11.5–15.5)
WBC: 8.4 10*3/uL (ref 4.0–10.5)

## 2014-11-03 LAB — URINE MICROSCOPIC-ADD ON

## 2014-11-03 MED ORDER — QUETIAPINE FUMARATE 25 MG PO TABS: ORAL_TABLET | ORAL | Status: DC

## 2014-11-03 NOTE — ED Notes (Signed)
Pt here for weakness and onset of confusion. Pt oriented to self only, but thinks he is at [employer]. His daughter states this confusion is not normal for him. Ongoing for 2 weeks but getting progressively worse. Pt arrives urinary catheter with leg bag. Pt went to neurologist today but they werent able to draw any blood from him.

## 2014-11-03 NOTE — Addendum Note (Signed)
Addended by: Levert FeinsteinYAN, Javonnie Illescas on: 11/03/2014 02:08 PM   Modules accepted: Orders

## 2014-11-03 NOTE — Telephone Encounter (Signed)
Patient got up in the middle of the night, dressed and ready to leave the house.  Stated he had to meet some people in 30 minutes.  He was upset when his caregiver would not allow him to leave.  It took the caregiver over one hour to get him back to bed.  He continued to exhibit worsening of his confusion.  She stated he had a bowel movement in the floor of the bathroom because he thought someone turned his toilet around backwards.  Spoke to Dr. Terrace ArabiaYan about these concerns and she has sent him in a prescription for Seroquel.  Stanton KidneyDebra is agreeable to this plan and will start him on it tonight.  I told her not to hesitate to contact our office with an further concerns.

## 2014-11-03 NOTE — Telephone Encounter (Signed)
Attempted blood draw in 2 consecutive days without success, TSH, B12 was ordered, will draw lab at his next yearly checkup

## 2014-11-03 NOTE — ED Provider Notes (Signed)
CSN: [id number]     Arrival date & time 11/03/14  1902 History   First MD Initiated Contact with Patient 11/03/14 2125     Chief Complaint  Patient presents with  . Weakness     (Consider location/radiation/quality/duration/timing/severity/associated sxs/prior Treatment) HPI [place] FASSNACHT is a 81 y.o. male who comes in for evaluation of altered mental status. Patient is accompanied by daughter at bedside to contribute history of present illness. There are states approximately 2 weeks ago, patient had unexplained urinary retention. They have been followed by urology for this problem. There are also seen neurology. Saw neurologist today, but they were unable to obtain lab work and recommended patient return next week for blood draw. Daughter reports over the past 2 days, patient has had worsening confusion. Patient believes it is July 4 and he is at [facility] to get a prescription filled. Daughter denies any fevers at home. Patient has macular degeneration and cannot see at baseline. No headaches, other vision changes, chest pain or shortness of breath, abdominal pain, nausea or vomiting, numbness or weakness, other urinary symptoms, diarrhea. Patient does have intermittent constipation that he takes MiraLAX when necessary. The patient denies any other medical complaints at this time.  Past Medical History  Diagnosis Date  . Bleeding ulcer   . Spinal stenosis   . Thyroid disease   . Hypertension   . Coronary artery disease     non-ST segment elevation myocardial infarction February of 2004 she was stenting of the circumflex using a Cypher drug-eluting stent.  . Hyperlipidemia   . Claudication (HCC)   . Macular degeneration   . Altered mental status    Past Surgical History  Procedure Laterality Date  . Coronary angioplasty with stent placement    . Thyroidectomy, partial    . Renal doppler  08/17/2005    Normal evaluation  . Cardiac catheterization  02/25/2002    Proximal L Circumflex 95%  lesion, stented w/ 3x18-mm CYPHER stent avoiding the 2 L Marginal branch, jailing the 1st marginal, resulting in reduction of a 90-95% lesion to 0% residual  . Cardiovascular stress test  12/18/2006    EKG negative for ischemia, no significant ischemia demonstrated.  . Transthoracic echocardiogram  03/13/2002    EF >55%, moderate LVH,   . Back surgery     Family History  Problem Relation Age of Onset  . Family history unknown: Yes   Social History  Substance Use Topics  . Smoking status: Former Games developer  . Smokeless tobacco: Never Used     Comment: Quit 50+ years ago.  . Alcohol Use: No    Review of Systems A 10 point review of systems was completed and was negative except for pertinent positives and negatives as mentioned in the history of present illness    Allergies  Tramadol  Home Medications   Prior to Admission medications   Medication Sig Start Date End Date Taking? Authorizing Provider  aspirin EC 81 MG tablet Take 81 mg by mouth daily.   Yes Historical Provider, MD  clopidogrel (PLAVIX) 75 MG tablet Take 1 tablet (75 mg total) by mouth daily. 10/30/14  Yes Runell Gess, MD  dorzolamide (TRUSOPT) 2 % ophthalmic solution Place 1 drop into the right eye every morning.  08/23/12  Yes Historical Provider, MD  finasteride (PROSCAR) 5 MG tablet Take 1 tablet (5 mg total) by mouth daily. 10/13/14  Yes Albertine Grates, MD  isosorbide mononitrate (IMDUR) 30 MG 24 hr tablet Take 1 tablet (30  mg total) by mouth daily. 10/30/14  Yes Runell Gess, MD  latanoprost (XALATAN) 0.005 % ophthalmic solution Place 1 drop into both eyes at bedtime.   Yes Historical Provider, MD  Levothyroxine Sodium (TIROSINT) 88 MCG CAPS Take 1 capsule by mouth daily before breakfast.   Yes Historical Provider, MD  losartan (COZAAR) 50 MG tablet Take 50 mg by mouth daily.  10/08/12  Yes Historical Provider, MD  metoprolol succinate (TOPROL-XL) 50 MG 24 hr tablet Take 25 mg by mouth daily. Take with or immediately  following a meal.   Yes Historical Provider, MD  mupirocin ointment (BACTROBAN) 2 % Place into the nose 2 (two) times daily. 10/13/14  Yes Albertine Grates, MD  QUEtiapine (SEROQUEL) 25 MG tablet One to 2 tabs po qhs. 11/03/14  Yes Levert Feinstein, MD  ranitidine (ZANTAC) 75 MG tablet Take 75 mg by mouth daily.   Yes Historical Provider, MD  simvastatin (ZOCOR) 40 MG tablet Take 40 mg by mouth daily.   Yes Historical Provider, MD  tamsulosin (FLOMAX) 0.4 MG CAPS capsule Take 0.4 mg by mouth daily. 10/13/14  Yes Historical Provider, MD  timolol (TIMOPTIC) 0.5 % ophthalmic solution Place 1 drop into both eyes 2 (two) times daily. Only at bedtime patient places 1 drop in right eye 09/18/12  Yes Historical Provider, MD  cephALEXin (KEFLEX) 500 MG capsule Take 1 capsule (500 mg total) by mouth 4 (four) times daily. 11/04/14   Rilley Poulter, PA-C   BP 139/93 mmHg  Pulse 80  Temp(Src) 97.7 F (36.5 C) (Oral)  Resp 16  SpO2 100% Physical Exam  Constitutional: He appears well-developed and well-nourished.  Well appearing African-American male.  HENT:  Head: Normocephalic and atraumatic.  Mouth/Throat: Oropharynx is clear and moist.  Eyes: Conjunctivae are normal. Pupils are equal, round, and reactive to light. Right eye exhibits no discharge. Left eye exhibits no discharge. No scleral icterus.  Neck: Neck supple.  Cardiovascular: Normal rate, regular rhythm and normal heart sounds.   Pulmonary/Chest: Effort normal and breath sounds normal. No respiratory distress. He has no wheezes. He has no rales.  Abdominal: Soft. There is no tenderness.  Genitourinary:  Uncircumcised. Foley catheter in place. Leg bag and place urine appears clear and yellow. No other discharge or other abnormal findings  Musculoskeletal: He exhibits no tenderness.  Neurological: He is alert.  Cranial Nerves II-XII grossly intact. Motor strength appears baseline for patient. Sensation is intact to light touch. Moves all extremities without  ataxia. He is oriented to person but not to place or time.  Skin: Skin is warm and dry. No rash noted.  Psychiatric: He has a normal mood and affect.  Nursing note and vitals reviewed.   ED Course  Procedures (including critical care time) Labs Review Labs Reviewed  COMPREHENSIVE METABOLIC PANEL - Abnormal; Notable for the following:    Chloride 100 (*)    Albumin 3.4 (*)    ALT 13 (*)    GFR calc non Af Amer 58 (*)    All other components within normal limits  URINALYSIS, ROUTINE W REFLEX MICROSCOPIC (NOT AT [facility]) - Abnormal; Notable for the following:    APPearance CLOUDY (*)    Specific Gravity, Urine 1.004 (*)    Hgb urine dipstick MODERATE (*)    Leukocytes, UA LARGE (*)    All other components within normal limits  URINE CULTURE  CBC  URINE MICROSCOPIC-ADD ON  CBG MONITORING, ED    Imaging Review No results found. I have  personally reviewed and evaluated these images and lab results as part of my medical decision-making.   EKG Interpretation None     Meds given in ED:  Medications  cefTRIAXone (ROCEPHIN) 1 g in dextrose 5 % 50 mL IVPB (not administered)    New Prescriptions   CEPHALEXIN (KEFLEX) 500 MG CAPSULE    Take 1 capsule (500 mg total) by mouth 4 (four) times daily.   Filed Vitals:   11/04/14 0000 11/04/14 0015 11/04/14 0030 11/04/14 0045  BP: 148/93 135/96 148/103 139/93  Pulse: 82 79 77 80  Temp:      TempSrc:      Resp: 13 16 13 16   SpO2: 100% 100% 100% 100%    MDM  Pershing CoxJames T Tessier is a 81 y.o. male with recent history of urinary retention with Foley catheter followed by urology, Dr. Mena GoesEskridge. Patient comes in for evaluation of altered mental status and overall increased confusion. Daughter reports increased confusion over the past 2 days. No new injuries. On arrival, patient is a normal apparent distress, denies any pain or medical symptoms. He is oriented to person but not to place or time. His Foley is intact, leg bag was replaced in the  ED. Urinalysis shows large leukocytes with 7-10 white cells. Urine culture obtained. Labs are otherwise noncontributory. Treated in the ED with 1 g Rocephin, will DC with Keflex and have patient follow up with urology. Overall, patient appears well, nontoxic, afebrile is hemodynamically stable and appropriate for discharge. No evidence of other acute or emergent pathology at this time. Strict return precautions given and verbal instructions given in addition to written instructions Prior to patient discharge, I discussed and reviewed this case with my attending, Dr. Juleen ChinaKohut who agrees with above plan. Final diagnoses:  UTI (lower urinary tract infection)       Joycie PeekBenjamin Phinley Schall, PA-C 11/04/14 0137  Joycie PeekBenjamin Shallon Yaklin, PA-C 11/04/14 [id number]  Raeford RazorStephen Kohut, MD 11/14/14 1116

## 2014-11-04 MED ORDER — CEPHALEXIN 500 MG PO CAPS: 500.0000 mg | ORAL_CAPSULE | Freq: Four times a day (QID) | ORAL | Status: DC

## 2014-11-04 MED ORDER — DEXTROSE 5 % IV SOLN
1.0000 g | Freq: Once | INTRAVENOUS | Status: AC
  Administered 2014-11-04: 1 g via INTRAVENOUS
  Filled 2014-11-04: qty 10

## 2014-11-04 NOTE — ED Notes (Signed)
Pt sent home with daughter and granddaughter. Foley left attached to standard drainage bag. Given leg bag to take home. Daughter has been switching the foley from standard bag at night back to a leg bag during the day.

## 2014-11-04 NOTE — Discharge Instructions (Signed)
You were evaluated in the ED today and there is not appear to be an emergent cause for her symptoms at this time. You were, however, found to have a UTI. You'll be treated for this in the ED with IV antibiotics and given a prescription at home for antibiotics. Please take all your medications as prescribed. Please follow-up with your urologist in 2-3 days for reevaluation. Return to ED for any new or worsening symptoms. This includes fevers, chills, abdominal pain, nausea or vomiting or worsening confusion.  Catheter-Associated Urinary Tract Infection FAQs What is "catheter-associated urinary tract infection"? A urinary tract infection (also called "UTI") is an infection in the urinary system, which includes the bladder (which stores the urine) and the kidneys (which filter the blood to make urine). Germs (for example, bacteria or yeasts) do not normally live in these areas; but if germs are introduced, an infection can occur. If you have a urinary catheter, germs can travel along the catheter and cause an infection in your bladder or your kidney; in that case it is called a catheter-associated urinary tract infection (or "CA-UTI").  What is a urinary catheter? A urinary catheter is a thin tube placed in the bladder to drain urine. Urine drains through the tube into a bag that collects the urine. A urinary catheter may be used:  If you are not able to urinate on your own  To measure the amount of urine that you make, for example, during intensive care  During and after some types of surgery  During some tests of the kidneys and bladder People with urinary catheters have a much higher chance of getting a urinary tract infection than people who don't have a catheter. How do I get a catheter-associated urinary tract infection (CA-UTI)? If germs enter the urinary tract, they may cause an infection. Many of the germs that cause a catheter-associated urinary tract infection are common germs found in your  intestines that do not usually cause an infection there. Germs can enter the urinary tract when the catheter is being put in or while the catheter remains in the bladder.  What are the symptoms of a urinary tract infection? Some of the common symptoms of a urinary tract infection are:  Burning or pain in the lower abdomen (that is, below the stomach)  Fever  Bloody urine may be a sign of infection, but is also caused by other problems  Burning during urination or an increase in the frequency of urination after the catheter is removed. Sometimes people with catheter-associated urinary tract infections do not have these symptoms of infection. Can catheter-associated urinary tract infections be treated? Yes, most catheter-associated urinary tract infections can be treated with antibiotics and removal or change of the catheter. Your doctor will determine which antibiotic is best for you.  What are some of the things that hospitals are doing to prevent catheter-associated urinary tract infections? To prevent urinary tract infections, doctors and nurses take the following actions.  Catheter insertion  External catheters in men (these look like condoms and are placed over the penis rather than into the penis)  Putting a temporary catheter in to drain the urine and removing it right away. This is called intermittent urethral catheterization. Catheter care What can I do to help prevent catheter-associated urinary tract infections if I have a catheter?  Always clean your hands before and after doing catheter care.  Always keep your urine bag below the level of your bladder.  Do not tug or pull on  the tubing.  Do not twist or kink the catheter tubing.  Ask your healthcare provider each day if you still need the catheter. What do I need to do when I go home from the hospital?  If you will be going home with a catheter, your doctor or nurse should explain everything you need to know about  taking care of the catheter. Make sure you understand how to care for it before you leave the hospital.  If you develop any of the symptoms of a urinary tract infection, such as burning or pain in the lower abdomen, fever, or an increase in the frequency of urination, contact your doctor or nurse immediately.  Before you go home, make sure you know who to contact if you have questions or problems after you get home. If you have questions, please ask your doctor or nurse. Developed and co-sponsored by [employer] Society for [employer] of [place] [phone](SHEA); Infectious Diseases Society of America (IDSA); [facility]; Association for Professionals in Infection Control and Epidemiology (APIC); Centers for Disease Control and Prevention (CDC); and [employer] Commission.   This information is not intended to replace advice given to you by your health care provider. Make sure you discuss any questions you have with your health care provider.   Document Released: 09/13/2011 Document Revised: 05/05/2014 Document Reviewed: 03/04/2014 Elsevier Interactive Patient Education [employer]2016 Elsevier Inc.

## 2014-11-05 ENCOUNTER — Encounter: Payer: Self-pay | Admitting: Cardiovascular Disease

## 2014-11-05 ENCOUNTER — Telehealth: Payer: Self-pay | Admitting: Neurology

## 2014-11-05 ENCOUNTER — Telehealth: Payer: Self-pay

## 2014-11-05 DIAGNOSIS — F03918 Unspecified dementia, unspecified severity, with other behavioral disturbance: Secondary | ICD-10-CM

## 2014-11-05 DIAGNOSIS — F0391 Unspecified dementia with behavioral disturbance: Secondary | ICD-10-CM

## 2014-11-05 LAB — URINE CULTURE

## 2014-11-05 MED ORDER — QUETIAPINE FUMARATE 25 MG PO TABS: ORAL_TABLET | ORAL | Status: DC

## 2014-11-05 NOTE — Telephone Encounter (Signed)
[name] called and states they would prefer QUEtiapine (SEROQUEL) 25 MG tablet be sent to the [facility] instead of the mail order pharm. Can they have a week or little more worth of Rx prescription until the mail order comes. Daughter states that it will be a week or more for it arrive. Please call [phone]

## 2014-11-05 NOTE — Telephone Encounter (Signed)
Jeremy [name] with [employer] [place] is calling to advise that Rx QUEtiapine (SEROQUEL) 25 MG tablet has been approved.

## 2014-11-05 NOTE — Telephone Encounter (Signed)
[employer] has been contacted, asking that they grant an exception for coverage for Seroquel.  Request is under review Ref # [id number] - [id number]

## 2014-11-05 NOTE — Telephone Encounter (Signed)
Rx sent to local pharmacy - daughter aware.

## 2014-11-06 ENCOUNTER — Inpatient Hospital Stay (HOSPITAL_COMMUNITY)
Admission: EM | Admit: 2014-11-06 | Discharge: 2014-11-11 | DRG: 689 | Disposition: A | Discharge status: Skilled Nursing Facility | Payer: Medicare Other | Attending: Internal Medicine | Admitting: Internal Medicine

## 2014-11-06 ENCOUNTER — Encounter: Payer: Self-pay | Admitting: Cardiovascular Disease

## 2014-11-06 ENCOUNTER — Emergency Department (HOSPITAL_COMMUNITY): Payer: Medicare Other

## 2014-11-06 ENCOUNTER — Inpatient Hospital Stay (HOSPITAL_COMMUNITY): Payer: Medicare Other

## 2014-11-06 ENCOUNTER — Encounter (HOSPITAL_COMMUNITY): Payer: Self-pay | Admitting: *Deleted

## 2014-11-06 DIAGNOSIS — M4800 Spinal stenosis, site unspecified: Secondary | ICD-10-CM | POA: Diagnosis present

## 2014-11-06 DIAGNOSIS — G9340 Encephalopathy, unspecified: Secondary | ICD-10-CM | POA: Diagnosis present

## 2014-11-06 DIAGNOSIS — I252 Old myocardial infarction: Secondary | ICD-10-CM

## 2014-11-06 DIAGNOSIS — E785 Hyperlipidemia, unspecified: Secondary | ICD-10-CM | POA: Diagnosis present

## 2014-11-06 DIAGNOSIS — E860 Dehydration: Secondary | ICD-10-CM | POA: Diagnosis present

## 2014-11-06 DIAGNOSIS — J9811 Atelectasis: Secondary | ICD-10-CM | POA: Diagnosis not present

## 2014-11-06 DIAGNOSIS — Z7982 Long term (current) use of aspirin: Secondary | ICD-10-CM | POA: Diagnosis not present

## 2014-11-06 DIAGNOSIS — Z87891 Personal history of nicotine dependence: Secondary | ICD-10-CM

## 2014-11-06 DIAGNOSIS — M25511 Pain in right shoulder: Secondary | ICD-10-CM | POA: Diagnosis present

## 2014-11-06 DIAGNOSIS — Z936 Other artificial openings of urinary tract status: Secondary | ICD-10-CM

## 2014-11-06 DIAGNOSIS — J690 Pneumonitis due to inhalation of food and vomit: Secondary | ICD-10-CM | POA: Diagnosis not present

## 2014-11-06 DIAGNOSIS — I2510 Atherosclerotic heart disease of native coronary artery without angina pectoris: Secondary | ICD-10-CM | POA: Diagnosis present

## 2014-11-06 DIAGNOSIS — Z955 Presence of coronary angioplasty implant and graft: Secondary | ICD-10-CM

## 2014-11-06 DIAGNOSIS — N139 Obstructive and reflux uropathy, unspecified: Secondary | ICD-10-CM | POA: Diagnosis present

## 2014-11-06 DIAGNOSIS — E039 Hypothyroidism, unspecified: Secondary | ICD-10-CM | POA: Diagnosis present

## 2014-11-06 DIAGNOSIS — M25519 Pain in unspecified shoulder: Secondary | ICD-10-CM

## 2014-11-06 DIAGNOSIS — G9341 Metabolic encephalopathy: Secondary | ICD-10-CM | POA: Diagnosis present

## 2014-11-06 DIAGNOSIS — R339 Retention of urine, unspecified: Secondary | ICD-10-CM | POA: Diagnosis present

## 2014-11-06 DIAGNOSIS — N179 Acute kidney failure, unspecified: Secondary | ICD-10-CM | POA: Diagnosis not present

## 2014-11-06 DIAGNOSIS — I10 Essential (primary) hypertension: Secondary | ICD-10-CM | POA: Diagnosis present

## 2014-11-06 DIAGNOSIS — Z79899 Other long term (current) drug therapy: Secondary | ICD-10-CM | POA: Diagnosis not present

## 2014-11-06 DIAGNOSIS — N390 Urinary tract infection, site not specified: Secondary | ICD-10-CM | POA: Diagnosis present

## 2014-11-06 DIAGNOSIS — I739 Peripheral vascular disease, unspecified: Secondary | ICD-10-CM | POA: Diagnosis present

## 2014-11-06 DIAGNOSIS — H3530 Unspecified macular degeneration: Secondary | ICD-10-CM | POA: Diagnosis present

## 2014-11-06 DIAGNOSIS — G8929 Other chronic pain: Secondary | ICD-10-CM | POA: Diagnosis present

## 2014-11-06 DIAGNOSIS — Z7902 Long term (current) use of antithrombotics/antiplatelets: Secondary | ICD-10-CM | POA: Diagnosis not present

## 2014-11-06 DIAGNOSIS — R509 Fever, unspecified: Secondary | ICD-10-CM

## 2014-11-06 LAB — COMPREHENSIVE METABOLIC PANEL
ALT: 12 U/L — ABNORMAL LOW (ref 17–63)
ANION GAP: 12 (ref 5–15)
AST: 30 U/L (ref 15–41)
Albumin: 3.4 g/dL — ABNORMAL LOW (ref 3.5–5.0)
Alkaline Phosphatase: 74 U/L (ref 38–126)
BUN: 10 mg/dL (ref 6–20)
CHLORIDE: 106 mmol/L (ref 101–111)
CO2: 19 mmol/L — AB (ref 22–32)
Calcium: 9 mg/dL (ref 8.9–10.3)
Creatinine, Ser: 1.07 mg/dL (ref 0.61–1.24)
GFR calc non Af Amer: >60 mL/min (ref >60)
Glucose, Bld: 114 mg/dL — ABNORMAL HIGH (ref 65–99)
Potassium: 3.7 mmol/L (ref 3.5–5.1)
SODIUM: 137 mmol/L (ref 135–145)
Total Bilirubin: 1 mg/dL (ref 0.3–1.2)
Total Protein: 7.9 g/dL (ref 6.5–8.1)

## 2014-11-06 LAB — URINALYSIS, ROUTINE W REFLEX MICROSCOPIC
BILIRUBIN URINE: NEGATIVE
Glucose, UA: NEGATIVE mg/dL
KETONES UR: NEGATIVE mg/dL
NITRITE: NEGATIVE
Protein, ur: NEGATIVE mg/dL
SPECIFIC GRAVITY, URINE: 1.011 (ref 1.005–1.030)
UROBILINOGEN UA: 1 mg/dL (ref 0.0–1.0)
pH: 6.5 (ref 5.0–8.0)

## 2014-11-06 LAB — CBC
HEMATOCRIT: 42.9 % (ref 39.0–52.0)
HEMOGLOBIN: 13.9 g/dL (ref 13.0–17.0)
MCH: 28.6 pg (ref 26.0–34.0)
MCHC: 32.4 g/dL (ref 30.0–36.0)
MCV: 88.3 fL (ref 78.0–100.0)
Platelets: 154 10*3/uL (ref 150–400)
RBC: 4.86 MIL/uL (ref 4.22–5.81)
RDW: 14.5 % (ref 11.5–15.5)
WBC: 7.4 10*3/uL (ref 4.0–10.5)

## 2014-11-06 LAB — URINE MICROSCOPIC-ADD ON

## 2014-11-06 LAB — MRSA PCR SCREENING: MRSA BY PCR: NEGATIVE

## 2014-11-06 LAB — CBG MONITORING, ED: Glucose-Capillary: 121 mg/dL — ABNORMAL HIGH (ref 65–99)

## 2014-11-06 MED ORDER — FAMOTIDINE 20 MG PO TABS
10.0000 mg | ORAL_TABLET | Freq: Every day | ORAL | Status: DC
  Administered 2014-11-07 – 2014-11-11 (×5): 10 mg via ORAL
  Filled 2014-11-06 (×5): qty 1

## 2014-11-06 MED ORDER — ACETAMINOPHEN 650 MG RE SUPP: 650.0000 mg | Freq: Four times a day (QID) | RECTAL | Status: DC | PRN

## 2014-11-06 MED ORDER — DEXTROSE 5 % IV SOLN
1.0000 g | INTRAVENOUS | Status: DC
  Administered 2014-11-06 – 2014-11-09 (×4): 1 g via INTRAVENOUS
  Filled 2014-11-06 (×5): qty 10

## 2014-11-06 MED ORDER — ASPIRIN EC 81 MG PO TBEC
81.0000 mg | DELAYED_RELEASE_TABLET | Freq: Every day | ORAL | Status: DC
  Administered 2014-11-07 – 2014-11-11 (×5): 81 mg via ORAL
  Filled 2014-11-06 (×5): qty 1

## 2014-11-06 MED ORDER — GUAIFENESIN-DM 100-10 MG/5ML PO SYRP: 5.0000 mL | ORAL_SOLUTION | ORAL | Status: DC | PRN

## 2014-11-06 MED ORDER — SODIUM CHLORIDE 0.9 % IV SOLN
INTRAVENOUS | Status: DC
  Administered 2014-11-06 – 2014-11-07 (×3): via INTRAVENOUS

## 2014-11-06 MED ORDER — LOSARTAN POTASSIUM 50 MG PO TABS
50.0000 mg | ORAL_TABLET | Freq: Every day | ORAL | Status: DC
Start: 2014-11-07 — End: 2014-11-11
  Administered 2014-11-07 – 2014-11-11 (×5): 50 mg via ORAL
  Filled 2014-11-06 (×5): qty 1

## 2014-11-06 MED ORDER — METOPROLOL SUCCINATE ER 25 MG PO TB24
25.0000 mg | ORAL_TABLET | Freq: Every day | ORAL | Status: DC
  Administered 2014-11-07 – 2014-11-11 (×5): 25 mg via ORAL
  Filled 2014-11-06 (×5): qty 1

## 2014-11-06 MED ORDER — ONDANSETRON HCL 4 MG PO TABS: 4.0000 mg | ORAL_TABLET | Freq: Four times a day (QID) | ORAL | Status: DC | PRN

## 2014-11-06 MED ORDER — SIMVASTATIN 40 MG PO TABS
40.0000 mg | ORAL_TABLET | Freq: Every day | ORAL | Status: DC
Start: 2014-11-06 — End: 2014-11-11
  Administered 2014-11-07 – 2014-11-11 (×5): 40 mg via ORAL
  Filled 2014-11-06 (×5): qty 1

## 2014-11-06 MED ORDER — TAMSULOSIN HCL 0.4 MG PO CAPS
0.4000 mg | ORAL_CAPSULE | Freq: Every day | ORAL | Status: DC
  Administered 2014-11-07 – 2014-11-11 (×5): 0.4 mg via ORAL
  Filled 2014-11-06 (×5): qty 1

## 2014-11-06 MED ORDER — IPRATROPIUM BROMIDE 0.02 % IN SOLN
0.5000 mg | Freq: Four times a day (QID) | RESPIRATORY_TRACT | Status: DC
  Administered 2014-11-06: 0.5 mg via RESPIRATORY_TRACT
  Filled 2014-11-06: qty 2.5

## 2014-11-06 MED ORDER — LEVOTHYROXINE SODIUM 75 MCG PO TABS
75.0000 ug | ORAL_TABLET | Freq: Every day | ORAL | Status: DC
  Administered 2014-11-07 – 2014-11-11 (×4): 75 ug via ORAL
  Filled 2014-11-06 (×5): qty 1

## 2014-11-06 MED ORDER — CLOPIDOGREL BISULFATE 75 MG PO TABS
75.0000 mg | ORAL_TABLET | Freq: Every day | ORAL | Status: DC
  Administered 2014-11-07 – 2014-11-11 (×5): 75 mg via ORAL
  Filled 2014-11-06 (×5): qty 1

## 2014-11-06 MED ORDER — SODIUM CHLORIDE 0.9 % IJ SOLN
3.0000 mL | Freq: Two times a day (BID) | INTRAMUSCULAR | Status: DC
  Administered 2014-11-08 – 2014-11-10 (×3): 3 mL via INTRAVENOUS

## 2014-11-06 MED ORDER — QUETIAPINE FUMARATE 25 MG PO TABS
25.0000 mg | ORAL_TABLET | Freq: Every day | ORAL | Status: DC
  Administered 2014-11-06 – 2014-11-10 (×5): 25 mg via ORAL
  Filled 2014-11-06 (×5): qty 1

## 2014-11-06 MED ORDER — ENOXAPARIN SODIUM 40 MG/0.4ML ~~LOC~~ SOLN
40.0000 mg | SUBCUTANEOUS | Status: DC
  Administered 2014-11-06 – 2014-11-11 (×6): 40 mg via SUBCUTANEOUS
  Filled 2014-11-06 (×6): qty 0.4

## 2014-11-06 MED ORDER — LATANOPROST 0.005 % OP SOLN
1.0000 [drp] | Freq: Every day | OPHTHALMIC | Status: DC
  Administered 2014-11-06 – 2014-11-10 (×5): 1 [drp] via OPHTHALMIC
  Filled 2014-11-06: qty 2.5

## 2014-11-06 MED ORDER — HYDROMORPHONE HCL 1 MG/ML IJ SOLN: 1.0000 mg | INTRAMUSCULAR | Status: DC | PRN

## 2014-11-06 MED ORDER — POLYETHYLENE GLYCOL 3350 17 G PO PACK: 17.0000 g | PACK | Freq: Every day | ORAL | Status: DC | PRN

## 2014-11-06 MED ORDER — ACETAMINOPHEN 325 MG PO TABS
650.0000 mg | ORAL_TABLET | Freq: Four times a day (QID) | ORAL | Status: DC | PRN
  Administered 2014-11-10: 650 mg via ORAL

## 2014-11-06 MED ORDER — ONDANSETRON HCL 4 MG/2ML IJ SOLN: 4.0000 mg | Freq: Four times a day (QID) | INTRAMUSCULAR | Status: DC | PRN

## 2014-11-06 MED ORDER — TIMOLOL MALEATE 0.5 % OP SOLN
1.0000 [drp] | Freq: Two times a day (BID) | OPHTHALMIC | Status: DC
  Administered 2014-11-06 – 2014-11-11 (×9): 1 [drp] via OPHTHALMIC
  Filled 2014-11-06 (×2): qty 5

## 2014-11-06 MED ORDER — ISOSORBIDE MONONITRATE ER 30 MG PO TB24
30.0000 mg | ORAL_TABLET | Freq: Every day | ORAL | Status: DC
  Administered 2014-11-07 – 2014-11-11 (×5): 30 mg via ORAL
  Filled 2014-11-06 (×5): qty 1

## 2014-11-06 MED ORDER — FINASTERIDE 5 MG PO TABS
5.0000 mg | ORAL_TABLET | Freq: Every evening | ORAL | Status: DC
  Administered 2014-11-06 – 2014-11-11 (×6): 5 mg via ORAL
  Filled 2014-11-06 (×6): qty 1

## 2014-11-06 MED ORDER — HYDROCODONE-ACETAMINOPHEN 5-325 MG PO TABS: 1.0000 | ORAL_TABLET | ORAL | Status: DC | PRN

## 2014-11-06 MED ORDER — DORZOLAMIDE HCL 2 % OP SOLN
1.0000 [drp] | Freq: Two times a day (BID) | OPHTHALMIC | Status: DC
  Administered 2014-11-07 – 2014-11-11 (×8): 1 [drp] via OPHTHALMIC
  Filled 2014-11-06 (×2): qty 10

## 2014-11-06 NOTE — ED Provider Notes (Signed)
CSN: [id number]     Arrival date & time 11/06/14  1143 History   First MD Initiated Contact with Patient 11/06/14 1151     Chief Complaint  Patient presents with  . Altered Mental Status     (Consider location/radiation/quality/duration/timing/severity/associated sxs/prior Treatment) HPI 5 caveat for altered mental status 81 y.o. male presents with continued weakness in setting of recent admission for same. She was also seen more recently and diagnosed with a urinary tract infection. He went home on antibiotics however his mental status is not improved.  According to the report a family at bedside 1 month ago the patient was essentially normal, took care of himself, walked slowly but independently. One month ago the patient began to have gradual and progressive decline in his functional status such the point that he is now unable to number anything except for his name and will frequently attempt to walk down stairs without assistance resulting in a fall over the last week. He has been evaluated by numerous other providers without clear etiology, however does have MRI scheduled. The patient was to go to urology today for a follow-up of urinary obstruction, however was unable to make this appointment due to his generalized weakness presents here for further evaluation. Past Medical History  Diagnosis Date  . Bleeding ulcer   . Spinal stenosis   . Thyroid disease   . Hypertension   . Coronary artery disease     non-ST segment elevation myocardial infarction February of 2004 she was stenting of the circumflex using a Cypher drug-eluting stent.  . Hyperlipidemia   . Claudication (HCC)   . Macular degeneration   . Altered mental status    Past Surgical History  Procedure Laterality Date  . Coronary angioplasty with stent placement    . Thyroidectomy, partial    . Renal doppler  08/17/2005    Normal evaluation  . Cardiac catheterization  02/25/2002    Proximal L Circumflex 95% lesion, stented  w/ 3x18-mm CYPHER stent avoiding the 2 L Marginal branch, jailing the 1st marginal, resulting in reduction of a 90-95% lesion to 0% residual  . Cardiovascular stress test  12/18/2006    EKG negative for ischemia, no significant ischemia demonstrated.  . Transthoracic echocardiogram  03/13/2002    EF >55%, moderate LVH,   . Back surgery     Family History  Problem Relation Age of Onset  . Family history unknown: Yes   Social History  Substance Use Topics  . Smoking status: Former Games developer  . Smokeless tobacco: Never Used     Comment: Quit 50+ years ago.  . Alcohol Use: No    Review of Systems  Unable to perform ROS: Mental status change      Allergies  Tramadol  Home Medications   Prior to Admission medications   Medication Sig Start Date End Date Taking? Authorizing Provider  aspirin EC 81 MG tablet Take 81 mg by mouth daily.   Yes Historical Provider, MD  cephALEXin (KEFLEX) 500 MG capsule Take 1 capsule (500 mg total) by mouth 4 (four) times daily. 11/04/14  Yes Joycie Peek, PA-C  clopidogrel (PLAVIX) 75 MG tablet Take 1 tablet (75 mg total) by mouth daily. 10/30/14  Yes Runell Gess, MD  dorzolamide (TRUSOPT) 2 % ophthalmic solution Place 1 drop into the right eye 2 (two) times daily.  08/23/12  Yes Historical Provider, MD  finasteride (PROSCAR) 5 MG tablet Take 1 tablet (5 mg total) by mouth daily. Patient taking differently: Take  5 mg by mouth every evening.  10/13/14  Yes Albertine GratesFang Xu, MD  isosorbide mononitrate (IMDUR) 30 MG 24 hr tablet Take 1 tablet (30 mg total) by mouth daily. 10/30/14  Yes Runell GessJonathan J Berry, MD  latanoprost (XALATAN) 0.005 % ophthalmic solution Place 1 drop into both eyes at bedtime.   Yes Historical Provider, MD  levothyroxine (SYNTHROID, LEVOTHROID) 75 MCG tablet Take 75 mcg by mouth daily. 11/06/14  Yes Historical Provider, MD  losartan (COZAAR) 50 MG tablet Take 50 mg by mouth daily.  10/08/12  Yes Historical Provider, MD  metoprolol succinate  (TOPROL-XL) 50 MG 24 hr tablet Take 25 mg by mouth daily. Take with or immediately following a meal.   Yes Historical Provider, MD  mupirocin ointment (BACTROBAN) 2 % Place into the nose 2 (two) times daily. 10/13/14  Yes Albertine GratesFang Xu, MD  QUEtiapine (SEROQUEL) 25 MG tablet One to 2 tabs po qhs. Patient taking differently: Take 25 mg by mouth at bedtime.  11/05/14  Yes Levert FeinsteinYijun Yan, MD  ranitidine (ZANTAC) 75 MG tablet Take 75 mg by mouth daily.   Yes Historical Provider, MD  simvastatin (ZOCOR) 40 MG tablet Take 40 mg by mouth daily at 6 PM.    Yes Historical Provider, MD  tamsulosin (FLOMAX) 0.4 MG CAPS capsule Take 0.4 mg by mouth daily. 10/13/14  Yes Historical Provider, MD  timolol (TIMOPTIC) 0.5 % ophthalmic solution Place 1 drop into both eyes 2 (two) times daily.  09/18/12  Yes Historical Provider, MD   BP 159/96 mmHg  Pulse 85  Temp(Src) 98.6 F (37 C) (Oral)  Resp 20  SpO2 97% Physical Exam  Constitutional: He appears well-developed and well-nourished.  HENT:  Head: Normocephalic and atraumatic.  Eyes: Conjunctivae and EOM are normal.  Neck: Normal range of motion. Neck supple.  Cardiovascular: Normal rate, regular rhythm and normal heart sounds.   Pulmonary/Chest: Effort normal and breath sounds normal. No respiratory distress.  Abdominal: He exhibits no distension. There is no tenderness. There is no rebound and no guarding.  Musculoskeletal: Normal range of motion.  Neurological: He is alert.  Oriented to name alone, diffuse weakness, no focal change  Skin: Skin is warm and dry.  Vitals reviewed.   ED Course  Procedures (including critical care time) Labs Review Labs Reviewed  COMPREHENSIVE METABOLIC PANEL - Abnormal; Notable for the following:    CO2 19 (*)    Glucose, Bld 114 (*)    Albumin 3.4 (*)    ALT 12 (*)    All other components within normal limits  URINALYSIS, ROUTINE W REFLEX MICROSCOPIC (NOT AT [facility]) - Abnormal; Notable for the following:    Hgb urine  dipstick SMALL (*)    Leukocytes, UA LARGE (*)    All other components within normal limits  URINE MICROSCOPIC-ADD ON - Abnormal; Notable for the following:    Squamous Epithelial / LPF FEW (*)    Bacteria, UA FEW (*)    All other components within normal limits  CBG MONITORING, ED - Abnormal; Notable for the following:    Glucose-Capillary 121 (*)    All other components within normal limits  CBC    Imaging Review Dg Chest 2 View  11/06/2014  CLINICAL DATA:  Altered mental status. EXAM: CHEST  2 VIEW COMPARISON:  January 26, 2014. FINDINGS: The heart size and mediastinal contours are within normal limits. Both lungs are clear. No pneumothorax or pleural effusion is noted. The visualized skeletal structures are unremarkable. IMPRESSION: No active cardiopulmonary  disease. Electronically Signed   By: Lupita Raider, M.D.   On: 11/06/2014 13:56   I have personally reviewed and evaluated these images and lab results as part of my medical decision-making.   EKG Interpretation None      MDM   Final diagnoses:  None    81 y.o. male with pertinent PMH of idiopathic altered mental status, urinary obstruction undergoing wu with indwelling foley  presents with continued altered mental status as above. There is been no acute change to precipitate visit today, rather the family is concerned the patient is unable to care for himself at home over the last week. He is attempting to walk downstairs and do other dangerous activities and has had a fall over one week ago. On arrival today the patient has vital signs and physical exam as above.  No focal neurologic deficits however the patient is only oriented to name.  Workup demonstrated UTI, consulted medicine for admission.    I have reviewed all laboratory and imaging studies if ordered as above  1. Shoulder pain, acute   2. Acute encephalopathy         Mirian Mo, MD 11/07/14 [phone]

## 2014-11-06 NOTE — ED Notes (Signed)
Attempted Report x1.   

## 2014-11-06 NOTE — ED Notes (Signed)
Called Phlebotomy to Draw Labs. Unable to Draw back for Labs with IV placement

## 2014-11-06 NOTE — ED Notes (Signed)
Pt and family reports having progressively increase in confusion and weakness since 10/8. Pt was seen here for same on 11/1 and diagnosed with UTI. Had followup today with urology and was sent here for further eval.

## 2014-11-06 NOTE — ED Notes (Signed)
Admitting MD at the bedside.  

## 2014-11-06 NOTE — ED Notes (Signed)
Patient returned from X-ray 

## 2014-11-06 NOTE — ED Notes (Signed)
Pervis HockingJichelle, RN [address] made aware patient is to be in MRI and then brought upstairs

## 2014-11-06 NOTE — Progress Notes (Signed)
NURSING PROGRESS NOTE  Jeremy [name] [id number] Admission Data: 11/06/2014 7:46 PM Attending Provider: Cathren Harshipudeep K Rai, MD IHK:[id number],LOVFIPCP:SANDERS,ROBYN N, MD Code Status: full   Jeremy [name] is a 81 y.o. male patient admitted from ED:  -No acute distress noted.  -No complaints of shortness of breath.  -No complaints of chest pain.   Cardiac Monitoring: Box # 18 in place. Cardiac monitor yields:normal sinus rhythm.  Blood pressure 183/108, pulse 88, temperature 98.3 F (36.8 C), temperature source Oral, resp. rate 16, weight 67.677 kg (149 lb 3.2 oz), SpO2 99 %.   IV Fluids:  IV in place, occlusive dsg intact without redness, IV cath hand right, condition patent and no redness and left, condition patent and no redness normal saline.   Allergies:  Tramadol  Past Medical History:   has a past medical history of Bleeding ulcer; Spinal stenosis; Thyroid disease; Hypertension; Coronary artery disease; Hyperlipidemia; Claudication (HCC); Macular degeneration; and Altered mental status.  Past Surgical History:   has past surgical history that includes Coronary angioplasty with stent; Thyroidectomy, partial; RENAL DOPPLER (08/17/2005); Cardiac catheterization (02/25/2002); Cardiovascular stress test (12/18/2006); transthoracic echocardiogram (03/13/2002); and Back surgery.  Social History:   reports that he has quit smoking. He has never used smokeless tobacco. He reports that he does not drink alcohol or use illicit drugs.  Skin: intact  Patient/Family orientated to room. Information packet given to patient/family. Admission inpatient armband information verified with patient/family to include name and date of birth and placed on patient arm. Side rails up x 2, fall assessment and education completed with patient/family. Patient/family able to verbalize understanding of risk associated with falls and verbalized understanding to call for assistance before getting out of bed. Call light within reach.  Patient/family able to voice and demonstrate understanding of unit orientation instructions.    Will continue to evaluate and treat per MD orders.

## 2014-11-06 NOTE — ED Notes (Signed)
Patient going to MRI before being transported upstairs.

## 2014-11-06 NOTE — ED Notes (Signed)
MD Gentry at the bedside.  

## 2014-11-06 NOTE — ED Notes (Signed)
Pt returned from X-ray.  

## 2014-11-06 NOTE — H&P (Signed)
History and Physical        Hospital Admission Note Date: 11/06/2014  Patient name: Jeremy [name] Medical record number: [id number] Date of birth: 01/13/1925 Age: 81 y.o. Gender: male  PCP: [name], MD  Referring physician: Dr Littie Deeds  Chief Complaint:  Confused  HPI: Patient is a 81 year old male with hypertension, hypothyroidism, coronary disease, hyperlipidemia, spinal stenosis, chronic low back pain who was recently discharged on 10/11 when he was admitted for urinary retention. Patient presented to ED as patient was having progressively worsened confusion and weakness for the last 2-3 weeks. Patient is unable to provide any history, is confused and does not know where he is. History was obtained from the patient's daughters in the room. Patient's daughter reported that he was having progressively increasing confusion and weakness, he was seen in the ED on 11/1 and was diagnosed with UTI. Patient had a scheduled follow-up with urology today however he was too weak and was recommended by urology to go to the ER. Per patient's daughter, he was so weak in the last few days and that patient was unable to feed himself or ambulate. Patient's daughter also noticed foul-smelling, urine. No fevers or chills. Patient's family also noticed right shoulder pain this morning. ER workup reviewed, CBC, BMET unremarkable, UA positive for UTI. Patient's family is unable to take care of him at home.   Review of Systems:  Unable to obtain from the patient due to his mental status  Past Medical History: Past Medical History  Diagnosis Date  . Bleeding ulcer   . Spinal stenosis   . Thyroid disease   . Hypertension   . Coronary artery disease     non-ST segment elevation myocardial infarction February of 2004 she was stenting of the circumflex using a Cypher drug-eluting stent.  .  Hyperlipidemia   . Claudication (HCC)   . Macular degeneration   . Altered mental status     Past Surgical History  Procedure Laterality Date  . Coronary angioplasty with stent placement    . Thyroidectomy, partial    . Renal doppler  08/17/2005    Normal evaluation  . Cardiac catheterization  02/25/2002    Proximal L Circumflex 95% lesion, stented w/ 3x18-mm CYPHER stent avoiding the 2 L Marginal branch, jailing the 1st marginal, resulting in reduction of a 90-95% lesion to 0% residual  . Cardiovascular stress test  12/18/2006    EKG negative for ischemia, no significant ischemia demonstrated.  . Transthoracic echocardiogram  03/13/2002    EF >55%, moderate LVH,   . Back surgery      Medications: Prior to Admission medications   Medication Sig Start Date End Date Taking? Authorizing Provider  aspirin EC 81 MG tablet Take 81 mg by mouth daily.   Yes Historical Provider, MD  cephALEXin (KEFLEX) 500 MG capsule Take 1 capsule (500 mg total) by mouth 4 (four) times daily. 11/04/14  Yes Joycie Peek, PA-C  clopidogrel (PLAVIX) 75 MG tablet Take 1 tablet (75 mg total) by mouth daily. 10/30/14  Yes Runell Gess, MD  dorzolamide (TRUSOPT) 2 % ophthalmic solution Place 1 drop into the right eye 2 (two) times daily.  08/23/12  Yes Historical Provider, MD  finasteride (PROSCAR) 5 MG tablet Take 1 tablet (5 mg total) by mouth daily. Patient taking differently: Take 5 mg by mouth every evening.  10/13/14  Yes Albertine GratesFang Xu, MD  isosorbide mononitrate (IMDUR) 30 MG 24 hr tablet Take 1 tablet (30 mg total) by mouth daily. 10/30/14  Yes Runell GessJonathan J Berry, MD  latanoprost (XALATAN) 0.005 % ophthalmic solution Place 1 drop into both eyes at bedtime.   Yes Historical Provider, MD  levothyroxine (SYNTHROID, LEVOTHROID) 75 MCG tablet Take 75 mcg by mouth daily. 11/06/14  Yes Historical Provider, MD  losartan (COZAAR) 50 MG tablet Take 50 mg by mouth daily.  10/08/12  Yes Historical Provider, MD  metoprolol  succinate (TOPROL-XL) 50 MG 24 hr tablet Take 25 mg by mouth daily. Take with or immediately following a meal.   Yes Historical Provider, MD  mupirocin ointment (BACTROBAN) 2 % Place into the nose 2 (two) times daily. 10/13/14  Yes Albertine GratesFang Xu, MD  QUEtiapine (SEROQUEL) 25 MG tablet One to 2 tabs po qhs. Patient taking differently: Take 25 mg by mouth at bedtime.  11/05/14  Yes Levert FeinsteinYijun Yan, MD  ranitidine (ZANTAC) 75 MG tablet Take 75 mg by mouth daily.   Yes Historical Provider, MD  simvastatin (ZOCOR) 40 MG tablet Take 40 mg by mouth daily at 6 PM.    Yes Historical Provider, MD  tamsulosin (FLOMAX) 0.4 MG CAPS capsule Take 0.4 mg by mouth daily. 10/13/14  Yes Historical Provider, MD  timolol (TIMOPTIC) 0.5 % ophthalmic solution Place 1 drop into both eyes 2 (two) times daily.  09/18/12  Yes Historical Provider, MD    Allergies:   Allergies  Allergen Reactions  . Tramadol Nausea And Vomiting    Social History:  Per chart, reports that he has quit smoking. He has never used smokeless tobacco. He reports that he does not drink alcohol or use illicit drugs. he currently lives at home with his  Family History: Family History  Problem Relation Age of Onset  .  unable to obtain from the patient due to his mental status     Physical Exam: Blood pressure 159/96, pulse 85, temperature 98.6 F (37 C), temperature source Oral, resp. rate 20, SpO2 97 %. General: Alert, awake, oriented to self, in no acute distress. HEENT: normocephalic, atraumatic, anicteric sclera, pink conjunctiva, pupils equal and reactive to light and accomodation, oropharynx clear Neck: supple, no masses or lymphadenopathy, no goiter, no bruits  Heart: Regular rate and rhythm, without murmurs, rubs or gallops. Lungs: Clear to auscultation bilaterally, no wheezing, rales or rhonchi. Abdomen: Soft, nontender, nondistended, positive bowel sounds, no masses. Extremities: No clubbing, cyanosis or edema with positive pedal pulses.  Right shoulder pain, ROM decreased due to pain Neuro: Grossly intact, no focal neurological deficits, strength 5/5 upper and lower extremities bilaterally Psych: alert and oriented x self, normal mood and affect Skin: no rashes or lesions, warm and dry   LABS on Admission:  Basic Metabolic Panel:  Recent Labs Lab 11/03/14 2015 11/06/14 1256  NA 135 137  K 3.6 3.7  CL 100* 106  CO2 24 19*  GLUCOSE 92 114*  BUN 12 10  CREATININE 1.10 1.07  CALCIUM 9.1 9.0   Liver Function Tests:  Recent Labs Lab 11/03/14 2015 11/06/14 1256  AST 25 30  ALT 13* 12*  ALKPHOS 85 74  BILITOT 0.6 1.0  PROT 7.8 7.9  ALBUMIN 3.4* 3.4*   No results for input(s): LIPASE, AMYLASE  in the last 168 hours. No results for input(s): AMMONIA in the last 168 hours. CBC:  Recent Labs Lab 11/03/14 2015 11/06/14 1256  WBC 8.4 7.4  HGB 14.4 13.9  HCT 42.6 42.9  MCV 88.0 88.3  PLT 189 154   Cardiac Enzymes: No results for input(s): CKTOTAL, CKMB, CKMBINDEX, TROPONINI in the last 168 hours. BNP: Invalid input(s): POCBNP CBG:  Recent Labs Lab 11/06/14 1205  GLUCAP 121*    Radiological Exams on Admission:  Dg Chest 2 View  11/06/2014  CLINICAL DATA:  Altered mental status. EXAM: CHEST  2 VIEW COMPARISON:  January 26, 2014. FINDINGS: The heart size and mediastinal contours are within normal limits. Both lungs are clear. No pneumothorax or pleural effusion is noted. The visualized skeletal structures are unremarkable. IMPRESSION: No active cardiopulmonary disease. Electronically Signed   By: Lupita Raider, M.D.   On: 11/06/2014 13:56   Ct Head Wo Contrast  10/22/2014  CLINICAL DATA:  Difficulty with speech. Worsened memory. Altered mental status. EXAM: CT HEAD WITHOUT CONTRAST TECHNIQUE: Contiguous axial images were obtained from the base of the skull through the vertex without intravenous contrast. COMPARISON:  02/09/2012 FINDINGS: Sinuses/Soft tissues: Surgical changes about both globes.  Mucosal thickening of ethmoid air cells. Hypoplastic frontal sinuses. Clear mastoid air cells. Intracranial: Moderate low density in the periventricular white matter likely related to small vessel disease. No mass lesion, hemorrhage, hydrocephalus, acute infarct, intra-axial, or extra-axial fluid collection. IMPRESSION: 1.  No acute intracranial abnormality. 2. Moderate small vessel ischemic change. 3. Mild sinus disease. Electronically Signed   By: Jeronimo Greaves M.D.   On: 10/22/2014 14:15   Ct Hip Right Wo Contrast  10/11/2014  CLINICAL DATA:  Right thigh pain. The patient is unable to bear weight due to the pain. EXAM: CT OF THE RIGHT HIP WITHOUT CONTRAST TECHNIQUE: Multidetector CT imaging of the right hip was performed according to the standard protocol. Multiplanar CT image reconstructions were also generated. COMPARISON:  Radiographs dated 10/11/2014 FINDINGS: There is no fracture or dislocation. The patient has moderately severe arthritis of the right hip with a small right hip effusion. There is an old avulsion from from the lesser trochanter. The patient has severe spinal stenosis at L3-4 and at L4-5 with severe right foraminal stenosis at L4-5. There is also fairly severe spinal stenosis at L5-S1. There is marked distention of the bladder almost to the top of the pelvis. IMPRESSION: 1. No acute abnormality of the right hip. Moderately severe arthritis of the right hip. 2. Severe spinal stenosis at L3-4 and L4-5 and L5-S1 with severe right foraminal stenosis at L4-5. 3. Distended urinary bladder. Electronically Signed   By: Francene Boyers M.D.   On: 10/11/2014 14:49   Dg Hip Unilat With Pelvis 2-3 Views Right  10/11/2014  CLINICAL DATA:  Right hip pain for 4 days without known injury. Difficulty ambulating. EXAM: DG HIP (WITH OR WITHOUT PELVIS) 2-3V RIGHT COMPARISON:  None. FINDINGS: No acute fracture or dislocation is identified. There is evidence of moderate osteoarthritis involving the hip joint  with joint space narrowing and proliferative changes present. Heterotopic bone formation is noted adjacent to the lesser trochanter, potentially secondary to prior trauma. The bony pelvis is intact and shows no evidence of fracture or diastasis. Soft tissues are unremarkable. No bony lesions or destruction identified. IMPRESSION: Moderate osteoarthritis of the right hip joint. Heterotopic bone adjacent to the lesser trochanter, potentially secondary to prior trauma. No acute fracture or dislocation. Electronically Signed  By: Irish Lack M.D.   On: 10/11/2014 12:08    *I have personally reviewed the images above*  EKG: No EKG available to review   Assessment/Plan Principal Problem:   Acute encephalopathy; likely worsened due to UTI, possibly has dementia - Obtain MRI of the brain, patient was scheduled to have an outpatient MRI next week  - place on IV antibiotics for UTI, assess for improvement  Active Problems: Urinary tract infection - Obtain urine culture and sensitivities, place on IV Rocephin    Coronary artery disease - Currently stable, no complaints of chest pain or shortness of breath, continue aspirin, beta blocker, losartan, Imdur, Plavix, statin.    Essential hypertension - Currently stable, continue losartan, metoprolol, Imdur    Hyperlipidemia - Continue statin    Right shoulder pain -Obtain right shoulder x-ray, PT OT evaluation    DVT prophylaxis: Lovenox  CODE STATUS: full code, discussed in detail with patient's daughter, HPOA  Family Communication: Admission, patients condition and plan of care including tests being ordered have been discussed with the patient's daughters who indicates understanding and agree with the plan and Code Status    Time Spent on Admission:   RAI,RIPUDEEP M.D. [facility] 11/06/2014, 2:40 PM Pager: [phone]  If 7PM-7AM, please contact night-coverage [email or web address] Password [email or web address]

## 2014-11-07 LAB — BASIC METABOLIC PANEL
ANION GAP: 12 (ref 5–15)
BUN: 13 mg/dL (ref 6–20)
CO2: 20 mmol/L — ABNORMAL LOW (ref 22–32)
Calcium: 8.6 mg/dL — ABNORMAL LOW (ref 8.9–10.3)
Chloride: 106 mmol/L (ref 101–111)
Creatinine, Ser: 1.66 mg/dL — ABNORMAL HIGH (ref 0.61–1.24)
GFR, EST AFRICAN AMERICAN: 41 mL/min — AB (ref >60)
GFR, EST NON AFRICAN AMERICAN: 35 mL/min — AB (ref >60)
GLUCOSE: 108 mg/dL — AB (ref 65–99)
POTASSIUM: 3.6 mmol/L (ref 3.5–5.1)
SODIUM: 138 mmol/L (ref 135–145)

## 2014-11-07 LAB — VITAMIN B12: Vitamin B-12: 396 pg/mL (ref 180–914)

## 2014-11-07 LAB — CBC
HEMATOCRIT: 38.5 % — AB (ref 39.0–52.0)
HEMOGLOBIN: 12.8 g/dL — AB (ref 13.0–17.0)
MCH: 29.2 pg (ref 26.0–34.0)
MCHC: 33.2 g/dL (ref 30.0–36.0)
MCV: 87.7 fL (ref 78.0–100.0)
Platelets: 173 10*3/uL (ref 150–400)
RBC: 4.39 MIL/uL (ref 4.22–5.81)
RDW: 14.4 % (ref 11.5–15.5)
WBC: 8.7 10*3/uL (ref 4.0–10.5)

## 2014-11-07 LAB — TSH: TSH: 3.056 u[IU]/mL (ref 0.350–4.500)

## 2014-11-07 LAB — AMMONIA: AMMONIA: 25 umol/L (ref 9–35)

## 2014-11-07 MED ORDER — VITAMIN B-12 1000 MCG PO TABS
1000.0000 ug | ORAL_TABLET | Freq: Every day | ORAL | Status: DC
  Administered 2014-11-08 – 2014-11-11 (×4): 1000 ug via ORAL
  Filled 2014-11-07 (×4): qty 1

## 2014-11-07 MED ORDER — IPRATROPIUM BROMIDE 0.02 % IN SOLN: 0.5000 mg | Freq: Four times a day (QID) | RESPIRATORY_TRACT | Status: DC | PRN

## 2014-11-07 NOTE — Evaluation (Signed)
Physical Therapy Evaluation Patient Details Name: Jeremy [name] MRN: [id number] DOB: [birth year] Today's Date: 11/07/2014   History of Present Illness  Pt. admitted 11/06/14 with increasing confusion and weakness, acute encephalopathy, UTI, AKI, R shoulder pain.  PMH  includes essential HTN, CAD, spinal stenosis, chronic LBP, possible dementia  Clinical Impression  Pt. Presents to PT with a significant decline in his functional mobility and gait as well as cognition due to above medical issues.  He will benefit from acute PT to further address the above problems to facilitate DC to disposition below.  Daughter indicates family in need of SNF placement as they cannot care for pt.     Follow Up Recommendations SNF;Supervision/Assistance - 24 hour    Equipment Recommendations  None recommended by PT    Recommendations for Other Services       Precautions / Restrictions Precautions Precautions: Fall Restrictions Weight Bearing Restrictions: No      Mobility  Bed Mobility Overal bed mobility: Needs Assistance;+2 for physical assistance Bed Mobility: Supine to Sit;Sit to Supine     Supine to sit: Mod assist Sit to supine: +2 for physical assistance;Max assist   General bed mobility comments: Pt. able to initiate moving LEs to EOB.  Needed mod assist to transition to sitting at EOB.  Pt. grasped side of bed firmly with both hands to hold self upright.  Had to release his fingers so he could lie back down with +2 max assist.  Not able to follow directions for tasks  Transfers Overall transfer level:  (not attempted due to safety concerns)                  Ambulation/Gait                Stairs            Wheelchair Mobility    Modified Rankin (Stroke Patients Only)       Balance Overall balance assessment: Needs assistance Sitting-balance support: Bilateral upper extremity supported;Feet supported Sitting balance-Leahy Scale: Poor Sitting balance -  Comments: pt. gripping edges of bed firmly for support.  Unable to sit unsupported.                                       Pertinent Vitals/Pain Pain Assessment: Faces Faces Pain Scale: No hurt    Home Living Family/patient expects to be discharged to:: Skilled nursing facility                 Additional Comments: decreased vision (daughter states pt. only sees shadows)    Prior Function Level of Independence: Independent (has needed use of RW in last 2 weeks only)         Comments: Lives with wife who has dementia. Family checks on frequently. Daughters do breakfast and dinner for wife.     Hand Dominance        Extremity/Trunk Assessment   Upper Extremity Assessment: Defer to OT evaluation           Lower Extremity Assessment: Generalized weakness      Cervical / Trunk Assessment: Normal  Communication   Communication: Other (comment) (limited verbalizations)  Cognition Arousal/Alertness: Awake/alert Behavior During Therapy: Anxious Overall Cognitive Status: Impaired/Different from baseline Area of Impairment: Orientation;Attention;Memory;Following commands;Safety/judgement;Awareness;Problem solving Orientation Level: Place;Time;Situation Current Attention Level: Focused Memory: Decreased short-term memory Following Commands: Follows one step commands inconsistently Safety/Judgement: Decreased awareness  of safety;Decreased awareness of deficits Awareness: Intellectual Problem Solving: Slow processing;Decreased initiation;Difficulty sequencing;Requires verbal cues;Requires tactile cues      General Comments      Exercises        Assessment/Plan    PT Assessment Patient needs continued PT services  PT Diagnosis Difficulty walking;Generalized weakness;Altered mental status   PT Problem List Decreased strength;Decreased activity tolerance;Decreased balance;Decreased mobility;Decreased knowledge of use of DME;Decreased safety  awareness;Decreased knowledge of precautions;Decreased cognition  PT Treatment Interventions DME instruction;Gait training;Functional mobility training;Therapeutic activities;Therapeutic exercise;Balance training;Cognitive remediation;Patient/family education   PT Goals (Current goals can be found in the Care Plan section) Acute Rehab PT Goals Patient Stated Goal: per daughter, pt. to go to SNF PT Goal Formulation: With patient/family Time For Goal Achievement: 11/21/14 Potential to Achieve Goals: Fair    Frequency Min 3X/week   Barriers to discharge Decreased caregiver support      Co-evaluation               End of Session   Activity Tolerance: Other (comment) (limited by cognition) Patient left: in bed;with call bell/phone within reach;with nursing/sitter in room;with family/visitor present;Other (comment) (RN in to place new foley; she states will set bed alarm) Nurse Communication: Mobility status         Time: [id number] PT Time Calculation (min) (ACUTE ONLY): 24 min   Charges:   PT Evaluation $Initial PT Evaluation Tier I: 1 Procedure     PT G CodesFerman [name]:        Jeremy [name] 11/07/2014, 12:59 PM Jeremy PickingSusan Nayzeth [name] PT Acute Rehab Services [phone] Bee[place] [phone]

## 2014-11-07 NOTE — Clinical Social Work Placement (Signed)
   CLINICAL SOCIAL WORK PLACEMENT  NOTE  Date:  11/07/2014  Patient Details  Name: Jeremy [name] MRN: [id number] Date of Birth: [birth year]  Clinical Social Work is seeking post-discharge placement for this patient at the Skilled  Nursing Facility level of care (*CSW will initial, date and re-position this form in  chart as items are completed):  Yes   Patient/family provided with [facility] Clinical Social Work Department's list of facilities offering this level of care within the geographic area requested by the patient (or if unable, by the patient's family).  Yes   Patient/family informed of their freedom to choose among providers that offer the needed level of care, that participate in Medicare, Medicaid or managed care program needed by the patient, have an available bed and are willing to accept the patient.  Yes   Patient/family informed of [facility]'s ownership interest in [facility] and [facility], as well as of the fact that they are under no obligation to receive care at these facilities.  PASRR submitted to EDS on       PASRR number received on       Existing PASRR number confirmed on       FL2 transmitted to all facilities in geographic area requested by pt/family on       FL2 transmitted to all facilities within larger geographic area on       Patient informed that his/her managed care company has contracts with or will negotiate with certain facilities, including the following:            Patient/family informed of bed offers received.  Patient chooses bed at       Physician recommends and patient chooses bed at      Patient to be transferred to   on  .  Patient to be transferred to facility by       Patient family notified on   of transfer.  Name of family member notified:        PHYSICIAN Please sign FL2     Additional Comment:    _______________________________________________ Derenda FennelBashira Loring Liskey, MSW, LCSWA [phone] 11/07/2014 5:26  PM

## 2014-11-07 NOTE — Progress Notes (Signed)
PROGRESS NOTE  Jeremy [name] [name]:[id number] DOB: 03/14/1925 DOA: 11/06/2014 PCP: [name], MD  Assessment/Plan: Acute encephalopathy; likely worsened due to UTI, possibly has dementia/parkinsons? - MRI negative  - place on IV antibiotics for UTI, assess for improvement -blood culture -RPR, HIV, ammonia, B12 -worsened over 6 weeks- neurology consult  Urinary tract infection - Obtain urine culture and sensitivities, place on IV Rocephin  AKI -foley does not appear to be draining -will flush and replace if needed   Coronary artery disease - Currently stable, no complaints of chest pain or shortness of breath, continue aspirin, beta blocker, losartan, Imdur, Plavix, statin.   Essential hypertension - Currently stable, continue losartan, metoprolol, Imdur   Hyperlipidemia - Continue statin   Right shoulder pain -shoulder x-ray normal, PT OT evaluation    Code Status: full Family Communication:  Disposition Plan:    Consultants:  neurology  Procedures:      HPI/Subjective: Will repeat name for every questions asked  Objective: Filed Vitals:   11/07/14 0439  BP: 103/62  Pulse: 77  Temp: 98.8 F (37.1 C)  Resp: 14    Intake/Output Summary (Last 24 hours) at 11/07/14 0853 Last data filed at 11/07/14 0600  Gross per 24 hour  Intake    715 ml  Output    650 ml  Net     65 ml   Filed Weights   11/06/14 1713 11/06/14 2123  Weight: 67.677 kg (149 lb 3.2 oz) 67.6 kg (149 lb 0.5 oz)    Exam:   General:  Eyes closed- blind per daughters, will not extend left arm  Cardiovascular: rrr  Respiratory: clear  Abdomen: +BS, soft  Musculoskeletal: no edema, after a few prompts, will follow commands   Data Reviewed: Basic Metabolic Panel:  Recent Labs Lab 11/03/14 2015 11/06/14 1256 11/07/14 0510  NA 135 137 138  K 3.6 3.7 3.6  CL 100* 106 106  CO2 24 19* 20*  GLUCOSE 92 114* 108*  BUN CREATININE 1.10 1.07 1.66*  CALCIUM  9.1 9.0 8.6*   Liver Function Tests:  Recent Labs Lab 11/03/14 2015 11/06/14 1256  AST 25 30  ALT 13* 12*  ALKPHOS 85 74  BILITOT 0.6 1.0  PROT 7.8 7.9  ALBUMIN 3.4* 3.4*   No results for input(s): LIPASE, AMYLASE in the last 168 hours. No results for input(s): AMMONIA in the last 168 hours. CBC:  Recent Labs Lab 11/03/14 2015 11/06/14 1256 11/07/14 0510  WBC 8.4 7.4 8.7  HGB 14.4 13.9 12.8*  HCT 42.6 42.9 38.5*  MCV 88.0 88.3 87.7  PLT 189 154 173   Cardiac Enzymes: No results for input(s): CKTOTAL, CKMB, CKMBINDEX, TROPONINI in the last 168 hours. BNP (last 3 results) No results for input(s): BNP in the last 8760 hours.  ProBNP (last 3 results) No results for input(s): PROBNP in the last 8760 hours.  CBG:  Recent Labs Lab 11/06/14 1205  GLUCAP 121*    Recent Results (from the past 240 hour(s))  Urine culture     Status: None   Collection Time: 11/03/14  9:31 PM  Result Value Ref Range Status   Specimen Description URINE, CATHETERIZED  Final   Special Requests ADDED 0107 11/04/14  Final   Culture MULTIPLE SPECIES PRESENT, SUGGEST RECOLLECTION  Final   Report Status 11/05/2014 FINAL  Final  MRSA PCR Screening     Status: None   Collection Time: 11/06/14  9:30 PM  Result Value Ref Range Status  MRSA by PCR NEGATIVE NEGATIVE Final    Comment:        The GeneXpert MRSA Assay (FDA approved for NASAL specimens only), is one component of a comprehensive MRSA colonization surveillance program. It is not intended to diagnose MRSA infection nor to guide or monitor treatment for MRSA infections.      Studies: Dg Chest 2 View  11/06/2014  CLINICAL DATA:  Altered mental status. EXAM: CHEST  2 VIEW COMPARISON:  January 26, 2014. FINDINGS: The heart size and mediastinal contours are within normal limits. Both lungs are clear. No pneumothorax or pleural effusion is noted. The visualized skeletal structures are unremarkable. IMPRESSION: No active  cardiopulmonary disease. Electronically Signed   By: Lupita RaiderJames  Green Jr, M.D.   On: 11/06/2014 13:56   Dg Shoulder Right  11/06/2014  CLINICAL DATA:  Acute right shoulder pain without reported injury. EXAM: RIGHT SHOULDER - 2+ VIEW COMPARISON:  None. FINDINGS: There is no evidence of fracture or dislocation. There is no evidence of arthropathy or other focal bone abnormality. Soft tissues are unremarkable. IMPRESSION: Normal right shoulder. Electronically Signed   By: Lupita RaiderJames  Green Jr, M.D.   On: 11/06/2014 15:03   Mr Brain Wo Contrast  11/06/2014  CLINICAL DATA:  81 year old male with hypertension, worsening confusion in weakness. Acute encephalopathy. Initial encounter. EXAM: MRI HEAD WITHOUT CONTRAST TECHNIQUE: Multiplanar, multiecho pulse sequences of the brain and surrounding structures were obtained without intravenous contrast. COMPARISON:  Head CT without contrast 10/22/2014, and earlier. FINDINGS: Study is intermittently degraded by motion artifact despite repeated imaging attempts. Cerebral volume is within normal limits for age. No restricted diffusion to suggest acute infarction. No midline shift, mass effect, evidence of mass lesion, ventriculomegaly, extra-axial collection or acute intracranial hemorrhage. Cervicomedullary junction and pituitary are within normal limits. Major intracranial vascular flow voids are within normal limits. Patchy and confluent cerebral white matter T2 and FLAIR hyperintensity. No cortical encephalomalacia or chronic cerebral blood products identified. Deep gray matter nuclei, brainstem and cerebellum are within normal limits for age. Negative visualized auditory structures. Mastoids and paranasal sinuses are clear. Postoperative changes to both globes. Negative orbit and scalp soft tissues. Visualized bone marrow signal is within normal limits. IMPRESSION: No acute intracranial abnormality. Nonspecific cerebral white matter changes. Electronically Signed   By: Odessa FlemingH  Hall  M.D.   On: 11/06/2014 17:29    Scheduled Meds: . aspirin EC  81 mg Oral Daily  . cefTRIAXone (ROCEPHIN)  IV  1 g Intravenous Q24H  . clopidogrel  75 mg Oral Daily  . dorzolamide  1 drop Right Eye BID  . enoxaparin (LOVENOX) injection  40 mg Subcutaneous Q24H  . famotidine  10 mg Oral Daily  . finasteride  5 mg Oral QPM  . isosorbide mononitrate  30 mg Oral Daily  . latanoprost  1 drop Both Eyes QHS  . levothyroxine  75 mcg Oral QAC breakfast  . losartan  50 mg Oral Daily  . metoprolol succinate  25 mg Oral Daily  . QUEtiapine  25 mg Oral QHS  . simvastatin  40 mg Oral q1800  . sodium chloride  3 mL Intravenous Q12H  . tamsulosin  0.4 mg Oral Daily  . timolol  1 drop Both Eyes BID   Continuous Infusions: . sodium chloride 75 mL/hr at 11/07/14 0847   Antibiotics Given (last 72 hours)    Date/Time Action Medication Dose Rate   11/06/14 1851 Given   cefTRIAXone (ROCEPHIN) 1 g in dextrose 5 % 50 mL IVPB  1 g 100 mL/hr      Principal Problem:   Acute encephalopathy Active Problems:   Coronary artery disease   Essential hypertension   Hyperlipidemia   Right shoulder pain   UTI (lower urinary tract infection)    Time spent: 25 min    Olive Zmuda  [facility] Pager [phone]. If 7PM-7AM, please contact night-coverage at [email or web address], password [facility] 11/07/2014, 8:53 AM  LOS: 1 day

## 2014-11-07 NOTE — Consult Note (Signed)
Reason for Consult: Ataxia / encephalopathy Referring Physician: Dr Benjamine Mola  CC: Confusion  HPI: Jeremy [name] is an 81 y.o. male admitted to [facility] 11/06/2014 for evaluation of progressive worsening confusion and weakness 2-3 weeks - question dementia - question parkinsonism The patient was unable to provide any history at the time of admission secondary to his confusion. He had recently been seen in emergency department for urinary tract infection. An MRI of the brain was unremarkable on 11/06/2014. Urine and blood cultures are pending. The patient is afebrile. His white blood cell count is 7.4 thousand on 11/06/2014. An ammonia level was within normal limits.  His liver function tests are unremarkable. IV Rocephin was started on the day of admission. Seroquel 25 mg at bedtime was continued as part of his home medications.  Multiple family members were in the room. The patient's wife has dementia. The patient has 4 daughters who provide details and answer questions. They report that the patient has been gradually getting more confused for 3-4 weeks; although, prior to the urinary tract infection the patient had been essentially independent at home. He has glaucoma with very poor vision but he had always been mentally sharp, performed his own ADLs, and was able to ambulate without an assistive device. Currently he is totally confused, unable to answer questions appropriately, and unable to follow all but the simplest commands. He has a pleasant affect.  Past Medical History  Diagnosis Date  . Bleeding ulcer   . Spinal stenosis   . Thyroid disease   . Hypertension   . Coronary artery disease     non-ST segment elevation myocardial infarction February of 2004 she was stenting of the circumflex using a Cypher drug-eluting stent.  . Hyperlipidemia   . Claudication (HCC)   . Macular degeneration   . Altered mental status     Past Surgical History  Procedure Laterality Date  .  Coronary angioplasty with stent placement    . Thyroidectomy, partial    . Renal doppler  08/17/2005    Normal evaluation  . Cardiac catheterization  02/25/2002    Proximal L Circumflex 95% lesion, stented w/ 3x18-mm CYPHER stent avoiding the 2 L Marginal branch, jailing the 1st marginal, resulting in reduction of a 90-95% lesion to 0% residual  . Cardiovascular stress test  12/18/2006    EKG negative for ischemia, no significant ischemia demonstrated.  . Transthoracic echocardiogram  03/13/2002    EF >55%, moderate LVH,   . Back surgery      Family History  Problem Relation Age of Onset  . Family history unknown: Yes    Social History:  reports that he has quit smoking. He has never used smokeless tobacco. He reports that he does not drink alcohol or use illicit drugs.  Allergies  Allergen Reactions  . Tramadol Nausea And Vomiting    Medications:  Scheduled: . aspirin EC  81 mg Oral Daily  . cefTRIAXone (ROCEPHIN)  IV  1 g Intravenous Q24H  . clopidogrel  75 mg Oral Daily  . dorzolamide  1 drop Right Eye BID  . enoxaparin (LOVENOX) injection  40 mg Subcutaneous Q24H  . famotidine  10 mg Oral Daily  . finasteride  5 mg Oral QPM  . isosorbide mononitrate  30 mg Oral Daily  . latanoprost  1 drop Both Eyes QHS  . levothyroxine  75 mcg Oral QAC breakfast  . losartan  50 mg Oral Daily  . metoprolol succinate  25 mg Oral  Daily  . QUEtiapine  25 mg Oral QHS  . simvastatin  40 mg Oral q1800  . sodium chloride  3 mL Intravenous Q12H  . tamsulosin  0.4 mg Oral Daily  . timolol  1 drop Both Eyes BID  . vitamin B-12  1,000 mcg Oral Daily    ROS: History obtained from The patient's daughters  General ROS: negative for - chills, fatigue, fever, night sweats, weight gain or weight loss Psychological ROS: negative for - behavioral disorder, hallucinations, memory difficulties, mood swings or suicidal ideation Ophthalmic ROS: negative for - blurry vision, double vision, eye pain or  loss of vision. 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 ENT ROS: negative for - epistaxis, nasal discharge, oral lesions, sore throat, tinnitus or vertigo Allergy and Immunology ROS: negative for - hives or itchy/watery eyes Hematological and Lymphatic ROS: negative for - bleeding problems, bruising or swollen lymph nodes Endocrine ROS: negative for - galactorrhea, hair pattern changes, polydipsia/polyuria or temperature intolerance Respiratory ROS: negative for - cough, hemoptysis, shortness of breath or wheezing Cardiovascular ROS: negative for - chest pain, dyspnea on exertion, edema or irregular heartbeat Gastrointestinal ROS: negative for - abdominal pain, diarrhea, hematemesis, nausea/vomiting or stool incontinence Genito-Urinary ROS: negative for - dysuria, hematuria, incontinence or urinary frequency/urgency. Recent UTI Musculoskeletal ROS: negative for - joint swelling or muscular weakness Neurological ROS: as noted in HPI Dermatological ROS: negative for rash and skin lesion changes   Physical Examination: Blood pressure 140/83, pulse 79, temperature 98.2 F (36.8 C), temperature source Oral, resp. rate 16, weight 67.6 kg (149 lb 0.5 oz), SpO2 99 %.  General - pleasant 81 year old male in no acute distress but significantly confused. Heart - Regular rate and rhythm - no murmer appreciated  Lungs - Clear to auscultation anteriorly Abdomen - Soft - non tender Extremities - Distal pulses intact - no edema Skin - Warm and dry   Neurologic Examination - limited exam secondary to the patient's inability to follow commands.  Mental Status: Alert but confused. Unable to answer even the simplest questions correctly. Follows only very simple commands.  Speech fluent without evidence of aphasia. He does have some perseveration (answers "3" repeatedly when testing visual fields even when no fingers are being held up.  Cranial Nerves: II: Visual fields cannot be tested due to perseveration  of response, pupils equal, round, reactive to light and accommodation III,IV, VI: ptosis not present, extra-ocular motions appear to be intact. V,VII: smile symmetric VIII: hearing normal bilaterally IX,X: gag reflex present XI: Shoulder shrug intact on the left. The family reports right shoulder problems. XII: midline tongue extension Motor: Right : Upper extremity grip 4/5    Left:     Upper extremity grip 4/5  Lower extremity   5/5     Lower extremity   5/5 Tone and bulk:normal tone throughout; no atrophy noted Sensory: Light touch appears to be intact although at times the patient was unable to identified the limb being touched. Deep Tendon Reflexes: 1+ and symmetric throughout Plantars: Right: downgoing   Left: downgoing Cerebellar: Unable to test Gait: Deferred for safety reasons CV: pulses palpable throughout    Laboratory Studies:   Basic Metabolic Panel:  Recent Labs Lab 11/03/14 2015 11/06/14 1256 11/07/14 0510  NA 135 137 138  K 3.6 3.7 3.6  CL 100* 106 106  CO2 24 19* 20*  GLUCOSE 92 114* 108*  BUN CREATININE 1.10 1.07 1.66*  CALCIUM 9.1 9.0 8.6*    Liver Function  Tests:  Recent Labs Lab 11/03/14 2015 11/06/14 1256  AST 25 30  ALT 13* 12*  ALKPHOS 85 74  BILITOT 0.6 1.0  PROT 7.8 7.9  ALBUMIN 3.4* 3.4*   No results for input(s): LIPASE, AMYLASE in the last 168 hours.  Recent Labs Lab 11/07/14 0910  AMMONIA 25    CBC:  Recent Labs Lab 11/03/14 2015 11/06/14 1256 11/07/14 0510  WBC 8.4 7.4 8.7  HGB 14.4 13.9 12.8*  HCT 42.6 42.9 38.5*  MCV 88.0 88.3 87.7  PLT 189 154 173    Cardiac Enzymes: No results for input(s): CKTOTAL, CKMB, CKMBINDEX, TROPONINI in the last 168 hours.  BNP: Invalid input(s): POCBNP  CBG:  Recent Labs Lab 11/06/14 1205  GLUCAP 121*    Microbiology: Results for orders placed or performed during the hospital encounter of 11/06/14  MRSA PCR Screening     Status: None   Collection Time:  11/06/14  9:30 PM  Result Value Ref Range Status   MRSA by PCR NEGATIVE NEGATIVE Final    Comment:        The GeneXpert MRSA Assay (FDA approved for NASAL specimens only), is one component of a comprehensive MRSA colonization surveillance program. It is not intended to diagnose MRSA infection nor to guide or monitor treatment for MRSA infections.     Coagulation Studies: No results for input(s): LABPROT, INR in the last 72 hours.  Urinalysis:  Recent Labs Lab 11/03/14 2131 11/06/14 1245  COLORURINE YELLOW YELLOW  LABSPEC 1.004* 1.011  PHURINE 6.5 6.5  GLUCOSEU NEGATIVE NEGATIVE  HGBUR MODERATE* SMALL*  BILIRUBINUR NEGATIVE NEGATIVE  KETONESUR NEGATIVE NEGATIVE  PROTEINUR NEGATIVE NEGATIVE  UROBILINOGEN 1.0 1.0  NITRITE NEGATIVE NEGATIVE  LEUKOCYTESUR LARGE* LARGE*    Lipid Panel:  No results found for: CHOL, TRIG, HDL, CHOLHDL, VLDL, LDLCALC  HgbA1C:  Lab Results  Component Value Date   HGBA1C 6.4* 10/13/2014    Urine Drug Screen:  No results found for: LABOPIA, COCAINSCRNUR, LABBENZ, AMPHETMU, THCU, LABBARB  Alcohol Level: No results for input(s): ETH in the last 168 hours.  Other results: EKG: Sinus rhythm rate 61 bpm. Please refer to the formal cardiology reading for complete details.  Imaging:  Dg Chest 2 View 11/06/2014   No active cardiopulmonary disease.   Dg Shoulder Right 11/06/2014   Normal right shoulder.   Mr Brain Wo Contrast 11/06/2014   No acute intracranial abnormality. Nonspecific cerebral white matter changes.     Assessment/Plan:  Pleasant 81 year old male who was for the most part independent until 3-4 weeks ago when he developed a urinary tract infection. Since that time his count progressively more confused and has had difficulty with ambulation. MR of the brain without contrast showed no acute intracranial abnormalities. Suspect changes are secondary to infection. Repeat urine culture pending.  Dr. Amada Jupiter to see for  further assessment/recommendations.  Delton See PA-C [facility] Pager [phone] 11/07/2014, 5:45 PM    I have seen and evaluated the patient. I have reviewed the above note and made appropriate changes.   I suspect that this represents continued TME secondary to infection. I would perform and EEG, but with negative MRI I do not think that further testing beyond this is likley to be of benefit.   He has been changed to rocephin and we will assess for response to this.   1) EEG 2) Continue to address UTI.   Ritta Slot, MD [facility] [phone]  If 7pm- 7am, please page neurology on call  as listed in AMION.

## 2014-11-07 NOTE — Clinical Social Work Note (Signed)
Clinical Social Work Assessment  Patient Details  Name: Jeremy [name] Forst MRN: [id number] Date of Birth: 18-Oct-1925  Date of referral:  11/07/14               Reason for consult:  Facility Placement, Discharge Planning                Permission sought to share information with:  Family Supports, Magazine features editoracility Contact Representative, Case Manager Permission granted to share information::  Yes, Verbal Permission Granted  Name::      Jeremy [name](Sharon Coole )  Agency::   (SNF's )  Relationship::   (Daughter )  Contact Information:   [phone])  Housing/Transportation Living arrangements for the past 2 months:  Single Family Home Source of Information:  Adult Children Patient Interpreter Needed:  None Criminal Activity/Legal Involvement Pertinent to Current Situation/Hospitalization:  No - Comment as needed Significant Relationships:  Adult Children Lives with:  Self Do you feel safe going back to the place where you live?  No Need for family participation in patient care:  No (Coment)  Care giving concerns:  Patient requiring short-term SNF placement.    Social Worker assessment / plan: CSW spoke with patient[place] d[place] in reference to post-acute placement for SNF. CSW introduced CSW role and SNF process. Pt's dtr reported that family is agreeable to SNF placement and anticipated SNF recommendations. Pt's dtrs Stanton KidneyDebra and [name] works 3rd shift [place]. Family agreeable to [place] in [place] No further concerns reported by family at this time. CSW remains available as needed.   Employment status:  Retired Database administratornsurance information:  Managed Medicare PT Recommendations:  Skilled Nursing Facility Information / Referral to community resources:  Skilled Nursing Facility  Patient/Family's Response to care:  Pt disoriented. Pt's dtr and family [place]able [place]. Pt's family supportive and involved in pt's care. Pt's  dtr pleasant and appreciated social work intervention.   Patient/Family's Understanding of and Emotional Response to Diagnosis, Current Treatment, and Prognosis:  Family knowledgeable of medical intervention and continued treatment.   Emotional Assessment Appearance:  Appears stated age Attitude/Demeanor/Rapport:   (Pleasant ) Affect (typically observed):  Accepting, Pleasant, Appropriate Orientation:  Oriented to Self, Oriented to Place, Oriented to  Time, Oriented to Situation Alcohol / Substance use:  Not Applicable Psych involvement (Current and /or in the community):  No (Comment)  Discharge Needs  Concerns to be addressed:  Care Coordination Readmission within the last 30 days:  No Current discharge risk:  Dependent with Mobility Barriers to Discharge:  Continued Medical Work up   [employer] Sher[employer]-WilliamsBashira Om [place], MSW, LCSWA [phone] 11/07/2014 5:26 PM

## 2014-11-08 LAB — URINE MICROSCOPIC-ADD ON

## 2014-11-08 LAB — CBC
HEMATOCRIT: 41.9 % (ref 39.0–52.0)
HEMOGLOBIN: 14.6 g/dL (ref 13.0–17.0)
MCH: 30.1 pg (ref 26.0–34.0)
MCHC: 34.8 g/dL (ref 30.0–36.0)
MCV: 86.4 fL (ref 78.0–100.0)
Platelets: 179 10*3/uL (ref 150–400)
RBC: 4.85 MIL/uL (ref 4.22–5.81)
RDW: 14.4 % (ref 11.5–15.5)
WBC: 7.7 10*3/uL (ref 4.0–10.5)

## 2014-11-08 LAB — URINALYSIS, ROUTINE W REFLEX MICROSCOPIC
Bilirubin Urine: NEGATIVE
Glucose, UA: NEGATIVE mg/dL
KETONES UR: NEGATIVE mg/dL
NITRITE: NEGATIVE
PROTEIN: NEGATIVE mg/dL
Specific Gravity, Urine: 1.008 (ref 1.005–1.030)
Urobilinogen, UA: 0.2 mg/dL (ref 0.0–1.0)
pH: 6.5 (ref 5.0–8.0)

## 2014-11-08 LAB — BASIC METABOLIC PANEL
Anion gap: 11 (ref 5–15)
BUN: 15 mg/dL (ref 6–20)
CHLORIDE: 109 mmol/L (ref 101–111)
CO2: 20 mmol/L — AB (ref 22–32)
Calcium: 8.8 mg/dL — ABNORMAL LOW (ref 8.9–10.3)
Creatinine, Ser: 1.36 mg/dL — ABNORMAL HIGH (ref 0.61–1.24)
GFR calc non Af Amer: 45 mL/min — ABNORMAL LOW (ref >60)
GFR, EST AFRICAN AMERICAN: 52 mL/min — AB (ref >60)
Glucose, Bld: 97 mg/dL (ref 65–99)
POTASSIUM: 3.3 mmol/L — AB (ref 3.5–5.1)
SODIUM: 140 mmol/L (ref 135–145)

## 2014-11-08 LAB — URINE CULTURE: CULTURE: NO GROWTH

## 2014-11-08 LAB — HIV ANTIBODY (ROUTINE TESTING W REFLEX): HIV SCREEN 4TH GENERATION: NONREACTIVE

## 2014-11-08 LAB — RPR: RPR: NONREACTIVE

## 2014-11-08 MED ORDER — POTASSIUM CHLORIDE CRYS ER 20 MEQ PO TBCR
40.0000 meq | EXTENDED_RELEASE_TABLET | Freq: Once | ORAL | Status: AC
  Administered 2014-11-08: 40 meq via ORAL
  Filled 2014-11-08: qty 2

## 2014-11-08 MED ORDER — HYDRALAZINE HCL 20 MG/ML IJ SOLN
5.0000 mg | Freq: Once | INTRAMUSCULAR | Status: AC
  Administered 2014-11-08: 5 mg via INTRAVENOUS
  Filled 2014-11-08: qty 1

## 2014-11-08 NOTE — Progress Notes (Signed)
Utilization Review Completed.  

## 2014-11-08 NOTE — Progress Notes (Signed)
PROGRESS NOTE  Jeremy [name] [name]:[id number] DOB: 07-23-1925 DOA: 11/06/2014 PCP: Gwynneth AlimentSANDERS,ROBYN N, MD  Assessment/Plan: Acute encephalopathy; likely worsened due to UTI, possibly has dementia/parkinsons? - MRI negative  - place on IV antibiotics for UTI, assess for improvement -blood culture -RPR, HIV, ammonia ok B12 low end of normal-- replace -worsened over 6 weeks- neurology consult  Urinary tract infection - Obtain urine culture and sensitivities, place on IV Rocephin  AKI -monitor -foley changed   Coronary artery disease - Currently stable, no complaints of chest pain or shortness of breath, continue aspirin, beta blocker, losartan, Imdur, Plavix, statin.   Essential hypertension - Currently stable, continue losartan, metoprolol, Imdur   Hyperlipidemia - Continue statin   Right shoulder pain -shoulder x-ray normal, PT OT evaluation    Code Status: full Family Communication:  Disposition Plan:    Consultants:  neurology  Procedures:      HPI/Subjective: More awake and interactive today  Objective: Filed Vitals:   11/08/14 0905  BP: 151/84  Pulse: 95  Temp: 98.8 F (37.1 C)  Resp: 18    Intake/Output Summary (Last 24 hours) at 11/08/14 1002 Last data filed at 11/08/14 0906  Gross per 24 hour  Intake   2045 ml  Output   3075 ml  Net  -1030 ml   Filed Weights   11/06/14 1713 11/06/14 2123 11/07/14 2109  Weight: 67.677 kg (149 lb 3.2 oz) 67.6 kg (149 lb 0.5 oz) 67.6 kg (149 lb 0.5 oz)    Exam:   General:  Awake, pleasantly confused  Cardiovascular: rrr  Respiratory: clear  Abdomen: +BS, soft  Musculoskeletal: no edema  Data Reviewed: Basic Metabolic Panel:  Recent Labs Lab 11/03/14 2015 11/06/14 1256 11/07/14 0510 11/08/14 0030  NA 135 137 138 140  K 3.6 3.7 3.6 3.3*  CL 100* 106 106 109  CO2 24 19* 20* 20*  GLUCOSE 92 114* 108* 97  BUN 12 10 13 15   CREATININE 1.10 1.07 1.66* 1.36*  CALCIUM 9.1 9.0 8.6* 8.8*    Liver Function Tests:  Recent Labs Lab 11/03/14 2015 11/06/14 1256  AST 25 30  ALT 13* 12*  ALKPHOS 85 74  BILITOT 0.6 1.0  PROT 7.8 7.9  ALBUMIN 3.4* 3.4*   No results for input(s): LIPASE, AMYLASE in the last 168 hours.  Recent Labs Lab 11/07/14 0910  AMMONIA 25   CBC:  Recent Labs Lab 11/03/14 2015 11/06/14 1256 11/07/14 0510 11/08/14 0030  WBC 8.4 7.4 8.7 7.7  HGB 14.4 13.9 12.8* 14.6  HCT 42.6 42.9 38.5* 41.9  MCV 88.0 88.3 87.7 86.4  PLT 189 154 173 179   Cardiac Enzymes: No results for input(s): CKTOTAL, CKMB, CKMBINDEX, TROPONINI in the last 168 hours. BNP (last 3 results) No results for input(s): BNP in the last 8760 hours.  ProBNP (last 3 results) No results for input(s): PROBNP in the last 8760 hours.  CBG:  Recent Labs Lab 11/06/14 1205  GLUCAP 121*    Recent Results (from the past 240 hour(s))  Urine culture     Status: None   Collection Time: 11/03/14  9:31 PM  Result Value Ref Range Status   Specimen Description URINE, CATHETERIZED  Final   Special Requests ADDED 0107 11/04/14  Final   Culture MULTIPLE SPECIES PRESENT, SUGGEST RECOLLECTION  Final   Report Status 11/05/2014 FINAL  Final  MRSA PCR Screening     Status: None   Collection Time: 11/06/14  9:30 PM  Result Value Ref Range Status  MRSA by PCR NEGATIVE NEGATIVE Final    Comment:        The GeneXpert MRSA Assay (FDA approved for NASAL specimens only), is one component of a comprehensive MRSA colonization surveillance program. It is not intended to diagnose MRSA infection nor to guide or monitor treatment for MRSA infections.      Studies: Dg Chest 2 View  11/06/2014  CLINICAL DATA:  Altered mental status. EXAM: CHEST  2 VIEW COMPARISON:  January 26, 2014. FINDINGS: The heart size and mediastinal contours are within normal limits. Both lungs are clear. No pneumothorax or pleural effusion is noted. The visualized skeletal structures are unremarkable. IMPRESSION: No  active cardiopulmonary disease. Electronically Signed   By: Lupita Raider, M.D.   On: 11/06/2014 13:56   Dg Shoulder Right  11/06/2014  CLINICAL DATA:  Acute right shoulder pain without reported injury. EXAM: RIGHT SHOULDER - 2+ VIEW COMPARISON:  None. FINDINGS: There is no evidence of fracture or dislocation. There is no evidence of arthropathy or other focal bone abnormality. Soft tissues are unremarkable. IMPRESSION: Normal right shoulder. Electronically Signed   By: Lupita Raider, M.D.   On: 11/06/2014 15:03   Mr Brain Wo Contrast  11/06/2014  CLINICAL DATA:  81 year old male with hypertension, worsening confusion in weakness. Acute encephalopathy. Initial encounter. EXAM: MRI HEAD WITHOUT CONTRAST TECHNIQUE: Multiplanar, multiecho pulse sequences of the brain and surrounding structures were obtained without intravenous contrast. COMPARISON:  Head CT without contrast 10/22/2014, and earlier. FINDINGS: Study is intermittently degraded by motion artifact despite repeated imaging attempts. Cerebral volume is within normal limits for age. No restricted diffusion to suggest acute infarction. No midline shift, mass effect, evidence of mass lesion, ventriculomegaly, extra-axial collection or acute intracranial hemorrhage. Cervicomedullary junction and pituitary are within normal limits. Major intracranial vascular flow voids are within normal limits. Patchy and confluent cerebral white matter T2 and FLAIR hyperintensity. No cortical encephalomalacia or chronic cerebral blood products identified. Deep gray matter nuclei, brainstem and cerebellum are within normal limits for age. Negative visualized auditory structures. Mastoids and paranasal sinuses are clear. Postoperative changes to both globes. Negative orbit and scalp soft tissues. Visualized bone marrow signal is within normal limits. IMPRESSION: No acute intracranial abnormality. Nonspecific cerebral white matter changes. Electronically Signed   By: Odessa Fleming M.D.   On: 11/06/2014 17:29    Scheduled Meds: . aspirin EC  81 mg Oral Daily  . cefTRIAXone (ROCEPHIN)  IV  1 g Intravenous Q24H  . clopidogrel  75 mg Oral Daily  . dorzolamide  1 drop Right Eye BID  . enoxaparin (LOVENOX) injection  40 mg Subcutaneous Q24H  . famotidine  10 mg Oral Daily  . finasteride  5 mg Oral QPM  . isosorbide mononitrate  30 mg Oral Daily  . latanoprost  1 drop Both Eyes QHS  . levothyroxine  75 mcg Oral QAC breakfast  . losartan  50 mg Oral Daily  . metoprolol succinate  25 mg Oral Daily  . potassium chloride  40 mEq Oral Once  . QUEtiapine  25 mg Oral QHS  . simvastatin  40 mg Oral q1800  . sodium chloride  3 mL Intravenous Q12H  . tamsulosin  0.4 mg Oral Daily  . timolol  1 drop Both Eyes BID  . vitamin B-12  1,000 mcg Oral Daily   Continuous Infusions:   Antibiotics Given (last 72 hours)    Date/Time Action Medication Dose Rate   11/06/14 1851 Given   cefTRIAXone (  ROCEPHIN) 1 g in dextrose 5 % 50 mL IVPB 1 g 100 mL/hr   11/07/14 1715 Given   cefTRIAXone (ROCEPHIN) 1 g in dextrose 5 % 50 mL IVPB 1 g 100 mL/hr      Principal Problem:   Acute encephalopathy Active Problems:   Coronary artery disease   Essential hypertension   Hyperlipidemia   Right shoulder pain   UTI (lower urinary tract infection)    Time spent: 25 min    Liandra Mendia  [facility] Pager [phone]. If 7PM-7AM, please contact night-coverage at [email or web address], password [facility] 11/08/2014, 10:02 AM  LOS: 2 days

## 2014-11-08 NOTE — Progress Notes (Signed)
BP 170/93 this morning.  Patient resting.  Notified NP on-call.  No new orders placed.  Will continue to monitor.

## 2014-11-09 ENCOUNTER — Inpatient Hospital Stay (HOSPITAL_COMMUNITY): Payer: Medicare Other

## 2014-11-09 DIAGNOSIS — G9340 Encephalopathy, unspecified: Secondary | ICD-10-CM

## 2014-11-09 LAB — URINE CULTURE: Culture: NO GROWTH

## 2014-11-09 NOTE — Care Management Important Message (Signed)
Important Message  Patient Details  Name: Jeremy [name] MRN: [id number] Date of Birth: [birth year]   Medicare Important Message Given:  Yes-second notification given    Jeremy [name], Jeremy [name] 11/09/2014, 1:45 PM

## 2014-11-09 NOTE — Progress Notes (Signed)
EEG completed; results pending.    

## 2014-11-09 NOTE — Progress Notes (Signed)
PROGRESS NOTE  Jeremy [name] [name]:[id number] DOB: 04-07-1925 DOA: 11/06/2014 PCP: Gwynneth AlimentSANDERS,ROBYN N, MD  Assessment/Plan: Acute encephalopathy; likely worsened due to UTI, possibly has dementia/parkinsons? - MRI negative  - place on IV antibiotics for UTI, no improvement -blood culture -RPR, HIV, ammonia ok B12 low end of normal-- replace -worsened over 6 weeks- neurology consult  Urinary tract infection - rocephin until d/c  AKI -monitor -foley changed   Coronary artery disease - Currently stable, no complaints of chest pain or shortness of breath, continue aspirin, beta blocker, losartan, Imdur, Plavix, statin.   Essential hypertension - Currently stable, continue losartan, metoprolol, Imdur   Hyperlipidemia - Continue statin   Right shoulder pain -shoulder x-ray normal, PT OT evaluation    Code Status: full Family Communication: grandson at bedside Disposition Plan: SNF in AM- await EEG   Consultants:  neurology  Procedures:      HPI/Subjective: Awake, seems to be at new baseline  Objective: Filed Vitals:   11/09/14 1000  BP: 117/65  Pulse: 89  Temp: 98.8 F (37.1 C)  Resp: 18    Intake/Output Summary (Last 24 hours) at 11/09/14 1238 Last data filed at 11/09/14 0900  Gross per 24 hour  Intake    650 ml  Output    550 ml  Net    100 ml   Filed Weights   11/06/14 2123 11/07/14 2109 11/08/14 2133  Weight: 67.6 kg (149 lb 0.5 oz) 67.6 kg (149 lb 0.5 oz) 68.4 kg (150 lb 12.7 oz)    Exam:   General:  Awake, pleasantly confused  Cardiovascular: rrr  Respiratory: clear  Abdomen: +BS, soft  Musculoskeletal: no edema  Data Reviewed: Basic Metabolic Panel:  Recent Labs Lab 11/03/14 2015 11/06/14 1256 11/07/14 0510 11/08/14 0030  NA 135 137 138 140  K 3.6 3.7 3.6 3.3*  CL 100* 106 106 109  CO2 24 19* 20* 20*  GLUCOSE 92 114* 108* 97  BUN 12 10 13 15   CREATININE 1.10 1.07 1.66* 1.36*  CALCIUM 9.1 9.0 8.6* 8.8*   Liver  Function Tests:  Recent Labs Lab 11/03/14 2015 11/06/14 1256  AST 25 30  ALT 13* 12*  ALKPHOS 85 74  BILITOT 0.6 1.0  PROT 7.8 7.9  ALBUMIN 3.4* 3.4*   No results for input(s): LIPASE, AMYLASE in the last 168 hours.  Recent Labs Lab 11/07/14 0910  AMMONIA 25   CBC:  Recent Labs Lab 11/03/14 2015 11/06/14 1256 11/07/14 0510 11/08/14 0030  WBC 8.4 7.4 8.7 7.7  HGB 14.4 13.9 12.8* 14.6  HCT 42.6 42.9 38.5* 41.9  MCV 88.0 88.3 87.7 86.4  PLT 189 154 173 179   Cardiac Enzymes: No results for input(s): CKTOTAL, CKMB, CKMBINDEX, TROPONINI in the last 168 hours. BNP (last 3 results) No results for input(s): BNP in the last 8760 hours.  ProBNP (last 3 results) No results for input(s): PROBNP in the last 8760 hours.  CBG:  Recent Labs Lab 11/06/14 1205  GLUCAP 121*    Recent Results (from the past 240 hour(s))  Urine culture     Status: None   Collection Time: 11/03/14  9:31 PM  Result Value Ref Range Status   Specimen Description URINE, CATHETERIZED  Final   Special Requests ADDED 0107 11/04/14  Final   Culture MULTIPLE SPECIES PRESENT, SUGGEST RECOLLECTION  Final   Report Status 11/05/2014 FINAL  Final  Urine culture     Status: None   Collection Time: 11/06/14  1:45 PM  Result  Value Ref Range Status   Specimen Description URINE, RANDOM  Final   Special Requests NONE  Final   Culture NO GROWTH 1 DAY  Final   Report Status 11/08/2014 FINAL  Final  MRSA PCR Screening     Status: None   Collection Time: 11/06/14  9:30 PM  Result Value Ref Range Status   MRSA by PCR NEGATIVE NEGATIVE Final    Comment:        The GeneXpert MRSA Assay (FDA approved for NASAL specimens only), is one component of a comprehensive MRSA colonization surveillance program. It is not intended to diagnose MRSA infection nor to guide or monitor treatment for MRSA infections.   Culture, blood (routine x 2)     Status: None (Preliminary result)   Collection Time: 11/07/14 12:05  PM  Result Value Ref Range Status   Specimen Description BLOOD RIGHT HAND  Final   Special Requests BOTTLES DRAWN AEROBIC ONLY 5CC  Final   Culture NO GROWTH 2 DAYS  Final   Report Status PENDING  Incomplete  Urine culture     Status: None   Collection Time: 11/08/14  3:34 AM  Result Value Ref Range Status   Specimen Description URINE, CATHETERIZED  Final   Special Requests NONE  Final   Culture NO GROWTH 1 DAY  Final   Report Status 11/09/2014 FINAL  Final     Studies: No results found.  Scheduled Meds: . aspirin EC  81 mg Oral Daily  . cefTRIAXone (ROCEPHIN)  IV  1 g Intravenous Q24H  . clopidogrel  75 mg Oral Daily  . dorzolamide  1 drop Right Eye BID  . enoxaparin (LOVENOX) injection  40 mg Subcutaneous Q24H  . famotidine  10 mg Oral Daily  . finasteride  5 mg Oral QPM  . isosorbide mononitrate  30 mg Oral Daily  . latanoprost  1 drop Both Eyes QHS  . levothyroxine  75 mcg Oral QAC breakfast  . losartan  50 mg Oral Daily  . metoprolol succinate  25 mg Oral Daily  . QUEtiapine  25 mg Oral QHS  . simvastatin  40 mg Oral q1800  . sodium chloride  3 mL Intravenous Q12H  . tamsulosin  0.4 mg Oral Daily  . timolol  1 drop Both Eyes BID  . vitamin B-12  1,000 mcg Oral Daily   Continuous Infusions:   Antibiotics Given (last 72 hours)    Date/Time Action Medication Dose Rate   11/06/14 1851 Given   cefTRIAXone (ROCEPHIN) 1 g in dextrose 5 % 50 mL IVPB 1 g 100 mL/hr   11/07/14 1715 Given   cefTRIAXone (ROCEPHIN) 1 g in dextrose 5 % 50 mL IVPB 1 g 100 mL/hr   11/08/14 1852 Given   cefTRIAXone (ROCEPHIN) 1 g in dextrose 5 % 50 mL IVPB 1 g 100 mL/hr      Principal Problem:   Acute encephalopathy Active Problems:   Coronary artery disease   Essential hypertension   Hyperlipidemia   Right shoulder pain   UTI (lower urinary tract infection)    Time spent: 25 min    Sasan Wilkie [facility]  [facility] Pager [phone]. If 7PM-7AM, please contact night-coverage at  [email or web address], password [email or web address] 11/09/2014, 12:38 PM  LOS: 3 days

## 2014-11-09 NOTE — Progress Notes (Signed)
Physical Therapy Treatment Patient Details Name: Jeremy [name] MRN: [id number] DOB: 10-16-1925 Today's Date: 11/09/2014    History of Present Illness Pt. admitted 11/06/14 with increasing confusion and weakness, acute encephalopathy, UTI, AKI, R shoulder pain.  PMH  includes essential HTN, CAD, spinal stenosis, chronic LBP, possible dementia    PT Comments    Pt was able to demonstrate better standing control but family is hoping to get to SNF level of care to complete.   He is motivated despite limitations of weakness and vision loss.  Follow Up Recommendations  SNF     Equipment Recommendations  None recommended by PT    Recommendations for Other Services       Precautions / Restrictions Precautions Precautions: Fall Restrictions Weight Bearing Restrictions: No    Mobility  Bed Mobility Overal bed mobility: Needs Assistance Bed Mobility: Supine to Sit;Sit to Supine Rolling: Max assist Sidelying to sit: Max assist;HOB elevated Supine to sit: Mod assist Sit to supine: Mod assist   General bed mobility comments: needed exact cues with tactile prompts due to vision losses  Transfers Overall transfer level: Needs assistance Equipment used: 2 person hand held assist (back of chair used with steadying person sitting on the seat) Transfers: Sit to/from Stand Sit to Stand: +2 physical assistance;+2 safety/equipment;Mod assist         General transfer comment: pt was given exact prompts for hand placement  Ambulation/Gait             General Gait Details: unable   Stairs            Wheelchair Mobility    Modified Rankin (Stroke Patients Only)       Balance Overall balance assessment: Needs assistance Sitting-balance support: Feet supported Sitting balance-Leahy Scale: Poor Sitting balance - Comments: Pt is reaching for objects to hold unless given something just like back of chair to support and control the effort Postural control: Posterior  lean Standing balance support: Bilateral upper extremity supported Standing balance-Leahy Scale: Poor                      Cognition Arousal/Alertness: Awake/alert Behavior During Therapy: Flat affect Overall Cognitive Status: Impaired/Different from baseline Area of Impairment: Attention;Following commands;Safety/judgement;Awareness;Problem solving Orientation Level: Disoriented to;Situation Current Attention Level: Divided Memory: Decreased recall of precautions;Decreased short-term memory Following Commands: Follows one step commands inconsistently Safety/Judgement: Decreased awareness of safety;Decreased awareness of deficits Awareness: Intellectual Problem Solving: Slow processing;Decreased initiation;Difficulty sequencing;Requires verbal cues      Exercises      General Comments General comments (skin integrity, edema, etc.): family is in and supportive, involved wtih his treatment.        Pertinent Vitals/Pain Pain Assessment: No/denies pain Faces Pain Scale: No hurt Pain Location: Rt shoulder Pain Descriptors / Indicators: Grimacing;Guarding Pain Intervention(s): Monitored during session;Repositioned    Home Living Family/patient expects to be discharged to:: Skilled nursing facility               Additional Comments: decreased vision, per grandson reports pt "really cant see at atll".    Prior Function Level of Independence: Independent      Comments: Pt lives with wife who has dementia and cannot assist him. Per pt's grandson pt and pt's wife has someone at there house 24 hours, usually daughters coming morning and night   PT Goals (current goals can now be found in the care plan section) Acute Rehab PT Goals Patient Stated Goal: none stated PT Goal  Formulation: With patient/family Progress towards PT goals: Progressing toward goals    Frequency  Min 3X/week    PT Plan Current plan remains appropriate    Co-evaluation              End of Session   Activity Tolerance: Other (comment) (cognition, vision) Patient left: in bed;with call bell/phone within reach;with bed alarm set;with family/visitor present     Time: [id number] PT Time Calculation (min) (ACUTE ONLY): 23 min  Charges:  $Therapeutic Activity: 23-37 mins                    G Codes:      Ivar DrapeStout, Kolbee Stallman E 11/09/2014, 2:48 PM  Samul Dadauth Edyn Qazi, PT MS Acute Rehab Dept. Number: [facility] [phone] and [facility] [phone]

## 2014-11-09 NOTE — Clinical Social Work Placement (Signed)
   CLINICAL SOCIAL WORK PLACEMENT  NOTE  Date:  11/09/2014  Patient Details  Name: Jeremy [name] MRN: [id number] Date of Birth: [birth year]  Clinical Social Work is seeking post-discharge placement for this patient at the Skilled  Nursing Facility level of care (*CSW will initial, date and re-position this form in  chart as items are completed):  Yes   Patient/family provided with [facility] Clinical Social Work Department's list of facilities offering this level of care within the geographic area requested by the patient (or if unable, by the patient's family).  Yes   Patient/family informed of their freedom to choose among providers that offer the needed level of care, that participate in Medicare, Medicaid or managed care program needed by the patient, have an available bed and are willing to accept the patient.  Yes   Patient/family informed of [facility]'s ownership interest in [facility] and [facility], as well as of the fact that they are under no obligation to receive care at these facilities.  PASRR submitted to EDS on       PASRR number received on       Existing PASRR number confirmed on       FL2 transmitted to all facilities in geographic area requested by pt/family on  11/09/14 0- [place]  FL2 transmitted to all facilities within larger geographic area on  11/09/14 [facility] [place]     Patient informed that his/her managed care company has contracts with or will negotiate with certain facilities, including the following:            Patient/family informed of bed offers received.  Patient chooses bed at       Physician recommends and patient chooses bed at      Patient to be transferred to   on  .  Patient to be transferred to facility by       Patient family notified on   of transfer.  Name of family member notified:        PHYSICIAN Please sign FL2     Additional Comment:  11/09/14 - SNF list emailed to daughter Lorrin GoodellSharon  Dilorenzo.  _______________________________________________ Cristobal Goldmannrawford, Natalie Leclaire Bradley, LCSW 11/09/2014, 6:12 PM

## 2014-11-09 NOTE — Evaluation (Signed)
Occupational Therapy Evaluation Patient Details Name: Jeremy [name] MRN: [id number] DOB: 04/30/1925 Today's Date: 11/09/2014    History of Present Illness Pt. admitted 11/06/14 with increasing confusion and weakness, acute encephalopathy, UTI, AKI, R shoulder pain.  PMH  includes essential HTN, CAD, spinal stenosis, chronic LBP, possible dementia   Clinical Impression   Pt admitted to hospital due to reason stated above. Pt currently with functional limitiations due to the deficits listed below (see OT problem list). Prior to admission pt was independent with ADLs. Pt currently requires set up to total assistance for ADLs. Per pt's grandson, pt's daughter's come by and check on pt and pt's wife who has dementia every morning and night. Pt will benefit from skilled OT to increase his independence and safety with ADLs and balance to allow discharge to venue listed below.    Follow Up Recommendations  SNF    Equipment Recommendations  Other (comment) (TBD next venue)    Recommendations for Other Services       Precautions / Restrictions Precautions Precautions: Fall Restrictions Weight Bearing Restrictions: No      Mobility Bed Mobility Overal bed mobility: Needs Assistance Bed Mobility: Rolling;Sidelying to Sit;Sit to Supine Rolling: Max assist Sidelying to sit: Max assist;HOB elevated   Sit to supine: Mod assist   General bed mobility comments: With increase time to follow command, pt able to initiate moving bil LEs to EOB and required min A to position competely off bed. Required max A and usage of bed rails to transition to upright sitting EOB. Required min A to position bil LE back onto bed.  Transfers Overall transfer level:  (not attempted due to safety concerns)                    Balance Overall balance assessment: Needs assistance Sitting-balance support: Bilateral upper extremity supported;Feet supported Sitting balance-Leahy Scale: Poor Sitting balance  - Comments: Pt seen gripping edges of bed firmly and bed rails for support.  Unable to sit unsupported.   Postural control: Posterior lean                                  ADL Overall ADL's : Needs assistance/impaired Eating/Feeding: Independent;Sitting   Grooming: Wash/dry face;Set up;Sitting;Bed level               Lower Body Dressing: Total assistance;Sitting/lateral leans Lower Body Dressing Details (indicate cue type and reason): pt unable to don Bil LE socks               General ADL Comments: Prior to admission pt was independent in ADLs. Pt able to sit EOB for ~10 mintues for precursor to ADL participation. Placed washcloth in pt Rt hand, pt able to wash face with verbal cues and increase time. Pt unable to don socks and required total assistance for completion.     Vision     Perception     Praxis      Pertinent Vitals/Pain Pain Assessment: Faces Faces Pain Scale: Hurts little more Pain Location: Rt shoulder Pain Descriptors / Indicators: Grimacing;Guarding Pain Intervention(s): Monitored during session;Repositioned     Hand Dominance Right   Extremity/Trunk Assessment Upper Extremity Assessment Upper Extremity Assessment: Overall WFL for tasks assessed   Lower Extremity Assessment Lower Extremity Assessment: Defer to PT evaluation   Cervical / Trunk Assessment Cervical / Trunk Assessment: Normal   Communication Communication Communication: No difficulties  Cognition Arousal/Alertness: Awake/alert Behavior During Therapy: Flat affect Overall Cognitive Status: Impaired/Different from baseline Area of Impairment: Orientation;Following commands Orientation Level: Disoriented to;Place;Time;Situation     Following Commands: Follows one step commands inconsistently;Follows one step commands with increased time     Problem Solving: Slow processing;Requires verbal cues;Requires tactile cues     General Comments       Exercises        Shoulder Instructions      Home Living Family/patient expects to be discharged to:: Skilled nursing facility                                 Additional Comments: decreased vision, per grandson reports pt "really cant see at atll".      Prior Functioning/Environment Level of Independence: Independent        Comments: Pt lives with wife who has dementia and cannot assist him. Per pt's grandson pt and pt's wife has someone at there house 24 hours, usually daughters coming morning and night    OT Diagnosis: Generalized weakness;Acute pain;Cognitive deficits   OT Problem List: Decreased strength;Decreased activity tolerance;Impaired balance (sitting and/or standing);Decreased cognition;Decreased safety awareness;Decreased knowledge of use of DME or AE;Pain   OT Treatment/Interventions: Self-care/ADL training;Therapeutic exercise;DME and/or AE instruction;Therapeutic activities;Patient/family education;Balance training    OT Goals(Current goals can be found in the care plan section) Acute Rehab OT Goals Patient Stated Goal: none stated OT Goal Formulation: With patient Time For Goal Achievement: 11/23/14 Potential to Achieve Goals: Good ADL Goals Pt Will Perform Grooming: with supervision;sitting Pt Will Perform Upper Body Bathing: with min assist;sitting Pt Will Perform Lower Body Dressing: with mod assist;with adaptive equipment;sit to/from stand Pt Will Transfer to Toilet: with max assist;stand pivot transfer;bedside commode Pt Will Perform Toileting - Clothing Manipulation and hygiene: with mod assist;sit to/from stand Pt/caregiver will Perform Home Exercise Program: Increased strength;Both right and left upper extremity;With theraband;With Supervision Additional ADL Goal #1: Pt will follow commands 50% of time during therapy session  OT Frequency: Min 2X/week   Barriers to D/C:            Co-evaluation              End of Session    Activity  Tolerance: Patient tolerated treatment well Patient left: in bed;with call bell/phone within reach   Time: [id number] OT Time Calculation (min): 24 min Charges:  OT General Charges $OT Visit: 1 Procedure OT Evaluation $Initial OT Evaluation Tier I: 1 Procedure G-Codes:    Smiley HousemanJames Landon Phenix [name] 11/09/2014, 2:12 PM

## 2014-11-09 NOTE — Procedures (Signed)
ELECTROENCEPHALOGRAM REPORT  Date of Study: 11/09/2014  Patient's Name: Jeremy [name] MRN: [id number] Date of Birth: 04-10-1925  Referring Provider: Dr. Marlin CanaryJessica Vann  Clinical History: This is an 81 year old man with progressively worsened confusion and weakness for the last 2-3 weeks.   Medications: acetaminophen (TYLENOL) tablet 650 mg aspirin EC tablet 81 mg cefTRIAXone (ROCEPHIN) 1 g in dextrose 5 % 50 mL IVPB clopidogrel (PLAVIX) tablet 75 mg dorzolamide (TRUSOPT) 2 % ophthalmic solution 1 drop famotidine (PEPCID) tablet 10 mg finasteride (PROSCAR) tablet 5 mg guaiFENesin-dextromethorphan (ROBITUSSIN DM) 100-10 MG/5ML syrup 5 mL ipratropium (ATROVENT) nebulizer solution 0.5 mg isosorbide mononitrate (IMDUR) 24 hr tablet 30 mg latanoprost (XALATAN) 0.005 % ophthalmic solution 1 drop levothyroxine (SYNTHROID, LEVOTHROID) tablet 75 mcg losartan (COZAAR) tablet 50 mg metoprolol succinate (TOPROL-XL) 24 hr tablet 25 mg QUEtiapine (SEROQUEL) tablet 25 mg simvastatin (ZOCOR) tablet 40 mg tamsulosin (FLOMAX) capsule 0.4 mg vitamin B-12 (CYANOCOBALAMIN) tablet 1,000 mcg  Technical Summary: A multichannel digital EEG recording measured by the international 10-20 system with electrodes applied with paste and impedances below 5000 ohms performed as portable with EKG monitoring in an awake and drowsy patient.  Hyperventilation and photic stimulation were not performed.  The digital EEG was referentially recorded, reformatted, and digitally filtered in a variety of bipolar and referential montages for optimal display.   Description: The patient is awake and drowsy during the recording. There is no clear posterior dominant rhythm. The background consists of a large amount of diffuse 4-5 Hz theta and 2-3 Hz delta slowing of the waking background. Triphasic waves are seen throughout the recording.  During drowsiness, there is an increase in theta and delta slowing of the background. Normal  sleep architecture is not seen. Hyperventilation and photic stimulation were not performed.  There were no epileptiform discharges or electrographic seizures seen.    EKG lead was unremarkable.  Impression: This awake and drowsy EEG is abnormal due to the presence of: 1. Moderate diffuse slowing of the waking background. 2. Triphasic waves throughout the recording  Clinical Correlation of the above findings indicates diffuse cerebral dysfunction that is non-specific in etiology and can be seen with hypoxic/ischemic injury, toxic/metabolic encephalopathies, neurodegenerative disorders, or medication effect. Triphasic waves are typically seen with hepatic encephalopathy, but can be seen with other metabolic encephalopathies as well. The absence of epileptiform discharges does not rule out a clinical diagnosis of epilepsy.  Clinical correlation is advised.   Patrcia DollyKaren Soraya Paquette, M.D.

## 2014-11-09 NOTE — NC FL2 (Signed)
[place] MEDICAID FL2 LEVEL OF CARE SCREENING TOOL     IDENTIFICATION  Patient Name: Jeremy [name] Birthdate: 03-05-1925 Sex: male Admission Date (Current Location): 11/06/2014  [facility] [place]   Facility and Address:  [facility], [address], [place], [place] [address]      Provider Number: [id number]  Attending Physician Name and Address:  Joseph ArtJessica U Vann, DO  Relative Name and Phone Number:  Jeremy GoodellSharon [name]    Current Level of Care: SNF Recommended Level of Care: Nursing Facility Prior Approval Number:    Date Approved/Denied:   PASRR Number: [id number] (Effective 11/09/14)  Discharge Plan: SNF    Current Diagnoses: Patient Active Problem List   Diagnosis Date Noted  . Acute encephalopathy 11/06/2014  . Right shoulder pain 11/06/2014  . UTI (lower urinary tract infection) 11/06/2014  . Acute pain of right thigh 10/11/2014  . Spinal stenosis   . CAP (community acquired pneumonia) 01/27/2014  . Syncope 01/26/2014  . Hypothyroidism 01/26/2014  . Coronary artery disease 10/21/2012  . Essential hypertension 10/21/2012  . Hyperlipidemia 10/21/2012  . Claudication [facility](HCC) 10/21/2012    Orientation ACTIVITIES/SOCIAL BLADDER RESPIRATION    Self, Place  Passive Continent (Patient has had a urethral catheter placed during hospitalization) Normal  BEHAVIORAL SYMPTOMS/MOOD NEUROLOGICAL BOWEL NUTRITION STATUS      Continent Diet (Heart Healthy diet)  PHYSICIAN VISITS COMMUNICATION OF NEEDS Height & Weight Skin    Verbally   150 lbs. Normal          AMBULATORY STATUS RESPIRATION    Assist extensive (Physical therapist unable to ambulate patient on 11/09/14) Normal      Personal Care Assistance Level of Assistance  Bathing, Feeding, Dressing Bathing Assistance: Maximum assistance Feeding assistance: Independent Dressing Assistance: Maximum assistance      Functional Limitations Info  Sight Sight Info: Impaired  (Patient is blind)           SPECIAL CARE FACTORS FREQUENCY  PT (By licensed PT), OT (By licensed OT)     PT Frequency: Evaluated 11/5. PT recommended a minimum of 3x per week OT Frequency: Evaluated 11/7. OT recommended a minimum of 2x per week.           Additional Factors Info  Code Status, Allergies Code Status Info: Full code Allergies Info: Tramadol           Current Medications (11/09/2014): Current Facility-Administered Medications  Medication Dose Route Frequency Provider Last Rate Last Dose  . acetaminophen (TYLENOL) tablet 650 mg  650 mg Oral Q6H PRN Ripudeep Jenna LuoK Rai, MD       Or  . acetaminophen (TYLENOL) suppository 650 mg  650 mg Rectal Q6H PRN Ripudeep Jenna LuoK Rai, MD      . aspirin EC tablet 81 mg  81 mg Oral Daily Ripudeep Jenna LuoK Rai, MD   81 mg at 11/09/14 1033  . cefTRIAXone (ROCEPHIN) 1 g in dextrose 5 % 50 mL IVPB  1 g Intravenous Q24H Ripudeep K Rai, MD   1 g at 11/09/14 1739  . clopidogrel (PLAVIX) tablet 75 mg  75 mg Oral Daily Ripudeep Jenna LuoK Rai, MD   75 mg at 11/09/14 1034  . dorzolamide (TRUSOPT) 2 % ophthalmic solution 1 drop  1 drop Right Eye BID Ripudeep Jenna LuoK Rai, MD   1 drop at 11/09/14 1303  . enoxaparin (LOVENOX) injection 40 mg  40 mg Subcutaneous Q24H Ripudeep K Rai, MD   40 mg at 11/09/14 1739  .  famotidine (PEPCID) tablet 10 mg  10 mg Oral Daily Ripudeep K Rai, MD   10 mg at 11/09/14 1036  . finasteride (PROSCAR) tablet 5 mg  5 mg Oral QPM Ripudeep K Rai, MD   5 mg at 11/09/14 1739  . guaiFENesin-dextromethorphan (ROBITUSSIN DM) 100-10 MG/5ML syrup 5 mL  5 mL Oral Q4H PRN Ripudeep K Rai, MD      . ipratropium (ATROVENT) nebulizer solution 0.5 mg  0.5 mg Nebulization Q6H PRN Ripudeep K Rai, MD      . isosorbide mononitrate (IMDUR) 24 hr tablet 30 mg  30 mg Oral Daily Ripudeep K Rai, MD   30 mg at 11/09/14 1033  . latanoprost (XALATAN) 0.005 % ophthalmic solution 1 drop  1 drop Both Eyes QHS Ripudeep K Rai, MD   1 drop at 11/08/14 2155  . levothyroxine  (SYNTHROID, LEVOTHROID) tablet 75 mcg  75 mcg Oral QAC breakfast Ripudeep Jenna Luo, MD   75 mcg at 11/09/14 0746  . losartan (COZAAR) tablet 50 mg  50 mg Oral Daily Ripudeep K Rai, MD   50 mg at 11/09/14 1034  . metoprolol succinate (TOPROL-XL) 24 hr tablet 25 mg  25 mg Oral Daily Ripudeep K Rai, MD   25 mg at 11/09/14 1034  . ondansetron (ZOFRAN) tablet 4 mg  4 mg Oral Q6H PRN Ripudeep K Rai, MD       Or  . ondansetron (ZOFRAN) injection 4 mg  4 mg Intravenous Q6H PRN Ripudeep K Rai, MD      . polyethylene glycol (MIRALAX / GLYCOLAX) packet 17 g  17 g Oral Daily PRN Ripudeep K Rai, MD      . QUEtiapine (SEROQUEL) tablet 25 mg  25 mg Oral QHS Ripudeep K Rai, MD   25 mg at 11/08/14 2155  . simvastatin (ZOCOR) tablet 40 mg  40 mg Oral q1800 Ripudeep Jenna Luo, MD   40 mg at 11/09/14 1739  . sodium chloride 0.9 % injection 3 mL  3 mL Intravenous Q12H Ripudeep K Rai, MD   3 mL at 11/08/14 1000  . tamsulosin (FLOMAX) capsule 0.4 mg  0.4 mg Oral Daily Ripudeep K Rai, MD   0.4 mg at 11/09/14 1034  . timolol (TIMOPTIC) 0.5 % ophthalmic solution 1 drop  1 drop Both Eyes BID Ripudeep K Rai, MD   1 drop at 11/09/14 1303  . vitamin B-12 (CYANOCOBALAMIN) tablet 1,000 mcg  1,000 mcg Oral Daily Joseph Art, DO   1,000 mcg at 11/09/14 1034   Do not use this list as official medication orders. Please verify with discharge summary.  Discharge Medications:   Medication List    ASK your doctor about these medications        aspirin EC 81 MG tablet  Take 81 mg by mouth daily.     cephALEXin 500 MG capsule  Commonly known as:  KEFLEX  Take 1 capsule (500 mg total) by mouth 4 (four) times daily.     clopidogrel 75 MG tablet  Commonly known as:  PLAVIX  Take 1 tablet (75 mg total) by mouth daily.     dorzolamide 2 % ophthalmic solution  Commonly known as:  TRUSOPT  Place 1 drop into the right eye 2 (two) times daily.     finasteride 5 MG tablet  Commonly known as:  PROSCAR  Take 1 tablet (5 mg total) by  mouth daily.     isosorbide mononitrate 30 MG 24 hr tablet  Commonly known  as:  IMDUR  Take 1 tablet (30 mg total) by mouth daily.     latanoprost 0.005 % ophthalmic solution  Commonly known as:  XALATAN  Place 1 drop into both eyes at bedtime.     levothyroxine 75 MCG tablet  Commonly known as:  SYNTHROID, LEVOTHROID  Take 75 mcg by mouth daily.     losartan 50 MG tablet  Commonly known as:  COZAAR  Take 50 mg by mouth daily.     metoprolol succinate 50 MG 24 hr tablet  Commonly known as:  TOPROL-XL  Take 25 mg by mouth daily. Take with or immediately following a meal.     mupirocin ointment 2 %  Commonly known as:  BACTROBAN  Place into the nose 2 (two) times daily.     QUEtiapine 25 MG tablet  Commonly known as:  SEROQUEL  One to 2 tabs po qhs.     ranitidine 75 MG tablet  Commonly known as:  ZANTAC  Take 75 mg by mouth daily.     simvastatin 40 MG tablet  Commonly known as:  ZOCOR  Take 40 mg by mouth daily at 6 PM.     tamsulosin 0.4 MG Caps capsule  Commonly known as:  FLOMAX  Take 0.4 mg by mouth daily.     timolol 0.5 % ophthalmic solution  Commonly known as:  TIMOPTIC  Place 1 drop into both eyes 2 (two) times daily.        Relevant Imaging Results:  Relevant Lab Results:  Recent Labs    Additional Information    Jeremy Goldmann, LCSW

## 2014-11-10 ENCOUNTER — Inpatient Hospital Stay (HOSPITAL_COMMUNITY): Payer: Medicare Other

## 2014-11-10 LAB — BASIC METABOLIC PANEL
Anion gap: 14 (ref 5–15)
BUN: 13 mg/dL (ref 6–20)
CHLORIDE: 104 mmol/L (ref 101–111)
CO2: 18 mmol/L — AB (ref 22–32)
Calcium: 8.7 mg/dL — ABNORMAL LOW (ref 8.9–10.3)
Creatinine, Ser: 0.9 mg/dL (ref 0.61–1.24)
GFR calc non Af Amer: >60 mL/min (ref >60)
Glucose, Bld: 107 mg/dL — ABNORMAL HIGH (ref 65–99)
POTASSIUM: 3.4 mmol/L — AB (ref 3.5–5.1)
SODIUM: 136 mmol/L (ref 135–145)

## 2014-11-10 LAB — CBC
HCT: 39.6 % (ref 39.0–52.0)
Hemoglobin: 13.2 g/dL (ref 13.0–17.0)
MCH: 29.1 pg (ref 26.0–34.0)
MCHC: 33.3 g/dL (ref 30.0–36.0)
MCV: 87.2 fL (ref 78.0–100.0)
Platelets: 167 10*3/uL (ref 150–400)
RBC: 4.54 MIL/uL (ref 4.22–5.81)
RDW: 14.3 % (ref 11.5–15.5)
WBC: 9.2 10*3/uL (ref 4.0–10.5)

## 2014-11-10 MED ORDER — AMOXICILLIN-POT CLAVULANATE 875-125 MG PO TABS
1.0000 | ORAL_TABLET | Freq: Two times a day (BID) | ORAL | Status: DC
  Administered 2014-11-10 – 2014-11-11 (×3): 1 via ORAL
  Filled 2014-11-10 (×3): qty 1

## 2014-11-10 NOTE — Progress Notes (Signed)
PROGRESS NOTE  Jeremy [name] [name]:[id number] DOB: 1925/08/19 DOA: 11/06/2014 PCP: [name], MD  Assessment/Plan: Acute encephalopathy; likely worsened due to UTI, possibly has dementia/parkinsons? - MRI negative  - place on IV antibiotics for UTI- no growth on culture -blood culture NGTD -RPR, HIV, ammonia ok B12 low end of normal-- replace neurology consult: MRI/EEG- metabolic encephalopathy  Fever -11/8 - x ray shows atelectasis- added incentive spirometry -daughter reports coughing with meals -SLP eval  Urinary tract infection - rocephin- change to augmentin for possible aspiration PNA  AKI -monitor -foley changed- resolved   Coronary artery disease - Currently stable, no complaints of chest pain or shortness of breath, continue aspirin, beta blocker, losartan, Imdur, Plavix, statin.   Essential hypertension - Currently stable, continue losartan, metoprolol, Imdur   Hyperlipidemia - Continue statin   Right shoulder pain -shoulder x-ray normal, PT OT evaluation    Code Status: full Family Communication: daughter at bedside at bedside Disposition Plan: SNF in AM? If afebrile   Consultants:  neurology  Procedures:      HPI/Subjective: Daughter reports coughing with meals Fever last PM  Objective: Filed Vitals:   11/10/14 1006  BP: 136/81  Pulse: 92  Temp: 99.9 F (37.7 C)  Resp: 18    Intake/Output Summary (Last 24 hours) at 11/10/14 1105 Last data filed at 11/10/14 1007  Gross per 24 hour  Intake    655 ml  Output   1200 ml  Net   -545 ml   Filed Weights   11/07/14 2109 11/08/14 2133 11/09/14 2058  Weight: 67.6 kg (149 lb 0.5 oz) 68.4 kg (150 lb 12.7 oz) 68.2 kg (150 lb 5.7 oz)    Exam:   General:  Awake, pleasantly confused- blind  Cardiovascular: rrr  Respiratory: clear  Abdomen: +BS, soft  Musculoskeletal: no edema  Data Reviewed: Basic Metabolic Panel:  Recent Labs Lab 11/03/14 2015 11/06/14 1256  11/07/14 0510 11/08/14 0030 11/10/14 0831  NA 135 137 138 140 136  K 3.6 3.7 3.6 3.3* 3.4*  CL 100* 106 106 109 104  CO2 24 19* 20* 20* 18*  GLUCOSE 92 114* 108* 97 107*  BUN CREATININE 1.10 1.07 1.66* 1.36* 0.90  CALCIUM 9.1 9.0 8.6* 8.8* 8.7*   Liver Function Tests:  Recent Labs Lab 11/03/14 2015 11/06/14 1256  AST 25 30  ALT 13* 12*  ALKPHOS 85 74  BILITOT 0.6 1.0  PROT 7.8 7.9  ALBUMIN 3.4* 3.4*   No results for input(s): LIPASE, AMYLASE in the last 168 hours.  Recent Labs Lab 11/07/14 0910  AMMONIA 25   CBC:  Recent Labs Lab 11/03/14 2015 11/06/14 1256 11/07/14 0510 11/08/14 0030 11/10/14 0831  WBC 8.4 7.4 8.7 7.7 9.2  HGB 14.4 13.9 12.8* 14.6 13.2  HCT 42.6 42.9 38.5* 41.9 39.6  MCV 88.0 88.3 87.7 86.4 87.2  PLT 189 154 173 179 167   Cardiac Enzymes: No results for input(s): CKTOTAL, CKMB, CKMBINDEX, TROPONINI in the last 168 hours. BNP (last 3 results) No results for input(s): BNP in the last 8760 hours.  ProBNP (last 3 results) No results for input(s): PROBNP in the last 8760 hours.  CBG:  Recent Labs Lab 11/06/14 1205  GLUCAP 121*    Recent Results (from the past 240 hour(s))  Urine culture     Status: None   Collection Time: 11/03/14  9:31 PM  Result Value Ref Range Status   Specimen Description URINE, CATHETERIZED  Final  Special Requests ADDED 0107 11/04/14  Final   Culture MULTIPLE SPECIES PRESENT, SUGGEST RECOLLECTION  Final   Report Status 11/05/2014 FINAL  Final  Urine culture     Status: None   Collection Time: 11/06/14  1:45 PM  Result Value Ref Range Status   Specimen Description URINE, RANDOM  Final   Special Requests NONE  Final   Culture NO GROWTH 1 DAY  Final   Report Status 11/08/2014 FINAL  Final  MRSA PCR Screening     Status: None   Collection Time: 11/06/14  9:30 PM  Result Value Ref Range Status   MRSA by PCR NEGATIVE NEGATIVE Final    Comment:        The GeneXpert MRSA Assay  (FDA approved for NASAL specimens only), is one component of a comprehensive MRSA colonization surveillance program. It is not intended to diagnose MRSA infection nor to guide or monitor treatment for MRSA infections.   Culture, blood (routine x 2)     Status: None (Preliminary result)   Collection Time: 11/07/14 12:05 PM  Result Value Ref Range Status   Specimen Description BLOOD RIGHT HAND  Final   Special Requests BOTTLES DRAWN AEROBIC ONLY 5CC  Final   Culture NO GROWTH 2 DAYS  Final   Report Status PENDING  Incomplete  Urine culture     Status: None   Collection Time: 11/08/14  3:34 AM  Result Value Ref Range Status   Specimen Description URINE, CATHETERIZED  Final   Special Requests NONE  Final   Culture NO GROWTH 1 DAY  Final   Report Status 11/09/2014 FINAL  Final     Studies: Dg Chest Port 1 View  11/10/2014  CLINICAL DATA:  81 year old male with fever.  Subsequent encounter. EXAM: PORTABLE CHEST 1 VIEW COMPARISON:  11/06/2014 chest x-ray. FINDINGS: Minimal increased markings left lung base have an appearance more suggestive of subsegmental atelectasis rather than infiltrate. Mildly tortuous aorta. Heart size within normal limits. IMPRESSION: Left base subsegmental atelectasis. Mildly tortuous thoracic aorta. Electronically Signed   By: Lacy DuverneySteven  Olson M.D.   On: 11/10/2014 08:29    Scheduled Meds: . aspirin EC  81 mg Oral Daily  . cefTRIAXone (ROCEPHIN)  IV  1 g Intravenous Q24H  . clopidogrel  75 mg Oral Daily  . dorzolamide  1 drop Right Eye BID  . enoxaparin (LOVENOX) injection  40 mg Subcutaneous Q24H  . famotidine  10 mg Oral Daily  . finasteride  5 mg Oral QPM  . isosorbide mononitrate  30 mg Oral Daily  . latanoprost  1 drop Both Eyes QHS  . levothyroxine  75 mcg Oral QAC breakfast  . losartan  50 mg Oral Daily  . metoprolol succinate  25 mg Oral Daily  . QUEtiapine  25 mg Oral QHS  . simvastatin  40 mg Oral q1800  . sodium chloride  3 mL Intravenous Q12H   . tamsulosin  0.4 mg Oral Daily  . timolol  1 drop Both Eyes BID  . vitamin B-12  1,000 mcg Oral Daily   Continuous Infusions:   Antibiotics Given (last 72 hours)    Date/Time Action Medication Dose Rate   11/07/14 1715 Given   cefTRIAXone (ROCEPHIN) 1 g in dextrose 5 % 50 mL IVPB 1 g 100 mL/hr   11/08/14 1852 Given   cefTRIAXone (ROCEPHIN) 1 g in dextrose 5 % 50 mL IVPB 1 g 100 mL/hr   11/09/14 1739 Given   cefTRIAXone (ROCEPHIN) 1 g in  dextrose 5 % 50 mL IVPB 1 g 100 mL/hr      Principal Problem:   Acute encephalopathy Active Problems:   Coronary artery disease   Essential hypertension   Hyperlipidemia   Right shoulder pain   UTI (lower urinary tract infection)    Time spent: 25 min    Consuello Lassalle [facility]  [facility] Pager [phone]. If 7PM-7AM, please contact night-coverage at [email or web address], password [facility] 11/10/2014, 11:05 AM  LOS: 4 days

## 2014-11-10 NOTE — Clinical Social Work Placement (Addendum)
   CLINICAL SOCIAL WORK PLACEMENT  NOTE 11/11/14 - DISCHARGED TO [facility]  Date:  11/10/2014  Patient Details  Name: Jeremy CoxJames T Schillo MRN: [id number] Date of Birth: 03/28/1925  Clinical Social Work is seeking post-discharge placement for this patient at the Skilled  Nursing Facility level of care (*CSW will initial, date and re-position this form in  chart as items are completed):  Yes   Patient/family provided with [facility] Clinical Social Work Department's list of facilities offering this level of care within the geographic area requested by the patient (or if unable, by the patient's family).  Yes   Patient/family informed of their freedom to choose among providers that offer the needed level of care, that participate in Medicare, Medicaid or managed care program needed by the patient, have an available bed and are willing to accept the patient.  Yes   Patient/family informed of [place]'s ownership interest in [facility] and [facility], as well as of the fact that they are under no obligation to receive care at these facilities.  PASRR submitted to EDS on  11/09/14     PASRR number received on  11/09/14     Existing PASRR number confirmed on       FL2 transmitted to all facilities in geographic area requested by pt/family on  11/09/14     FL2 transmitted to all facilities within larger geographic area on       Patient informed that his/her managed care company has contracts with or will negotiate with certain facilities, including the following:         11/11/14  - Patient/family informed of bed offers received.  [facility]  [facility]     Physician recommends and patient chooses bed at      Patient to be transferred to  [facility] on  11/11/14.  Patient to be transferred to facility by  ambulance     Patient family notified on  11/11/14 of transfer.  Name of family member notified:   Patient's daughter Jasmine DecemberSharon    PHYSICIAN Please sign FL2      Additional Comment:  11/11/14 - Received authorization from [facility] - #[id number]   _______________________________________________ Cristobal Goldmannrawford, Meridith Romick Bradley, LCSW 11/10/2014, 4:43 PM

## 2014-11-10 NOTE — Progress Notes (Signed)
[facility]  Patient Status: Active (receiving services up to time of hospitalization)  [facility] is providing the following services: RN, PT, MSW and HHA  If patient discharges after hours, please call [phone].   Kizzie FurnishDonna Fellmy 11/10/2014, 11:47 AM

## 2014-11-11 ENCOUNTER — Telehealth: Payer: Self-pay | Admitting: Neurology

## 2014-11-11 DIAGNOSIS — I2510 Atherosclerotic heart disease of native coronary artery without angina pectoris: Secondary | ICD-10-CM

## 2014-11-11 DIAGNOSIS — M25511 Pain in right shoulder: Secondary | ICD-10-CM

## 2014-11-11 DIAGNOSIS — E785 Hyperlipidemia, unspecified: Secondary | ICD-10-CM

## 2014-11-11 MED ORDER — AMOXICILLIN-POT CLAVULANATE 875-125 MG PO TABS: 1.0000 | ORAL_TABLET | Freq: Two times a day (BID) | ORAL | Status: DC

## 2014-11-11 MED ORDER — GUAIFENESIN-DM 100-10 MG/5ML PO SYRP: 5.0000 mL | ORAL_SOLUTION | ORAL | Status: DC | PRN

## 2014-11-11 NOTE — Evaluation (Signed)
Clinical/Bedside Swallow Evaluation Patient Details  Name: Jeremy [name] MRN: [id number] Date of Birth: 1925/10/09  Today's Date: 11/11/2014 Time: SLP Start Time (ACUTE ONLY): 0914 SLP Stop Time (ACUTE ONLY): 0932 SLP Time Calculation (min) (ACUTE ONLY): 18 min  Past Medical History:  Past Medical History  Diagnosis Date  . Bleeding ulcer   . Spinal stenosis   . Thyroid disease   . Hypertension   . Coronary artery disease     non-ST segment elevation myocardial infarction February of 2004 she was stenting of the circumflex using a Cypher drug-eluting stent.  . Hyperlipidemia   . Claudication (HCC)   . Macular degeneration   . Altered mental status    Past Surgical History:  Past Surgical History  Procedure Laterality Date  . Coronary angioplasty with stent placement    . Thyroidectomy, partial    . Renal doppler  08/17/2005    Normal evaluation  . Cardiac catheterization  02/25/2002    Proximal L Circumflex 95% lesion, stented w/ 3x18-mm CYPHER stent avoiding the 2 L Marginal branch, jailing the 1st marginal, resulting in reduction of a 90-95% lesion to 0% residual  . Cardiovascular stress test  12/18/2006    EKG negative for ischemia, no significant ischemia demonstrated.  . Transthoracic echocardiogram  03/13/2002    EF >55%, moderate LVH,   . Back surgery     HPI:  81 year old male with hypertension, hypothyroidism, coronary disease, hyperlipidemia, spinal stenosis, chronic low back pain, UTI diagnosis 11/1 who was recently discharged on 10/11 when he was admitted for urinary retention. Patient admitted with worsened confusion and weakness for the last 2-3 weeks. CXR left base subsegmental atelectasis. No prior ST documentation found. BSE ordered due to daughter reports pt coughing with meals.    Assessment / Plan / Recommendation Clinical Impression  Pleasantly confused with daughter at bedside reporting pt holding food, inability to use straw and cough with thin liquids  11/7. Pt appeared to have cognitively based oral and pharyngeal dysphagi. Today, daughter reports is "360 improved from yesterday." Oral and pharyngeal swallow WFL's with trials thin (straw) and solid WFL. Decreased appetitie, however pt requested cornbread and buttermilk which pt's sister might bring today. Pt's cognition was "fine" and pt independent prior to 2 weeks ago per daughter. SLP reiterated importance of eating at alert times, sitting upright, small sips/bites, minimize distraction. Recommend pt continue regular diet and thin liquids, no further ST needed.      Aspiration Risk   (mild-mod)    Diet Recommendation Age appropriate regular solids;Thin   Medication Administration: Crushed with puree Compensations: Check for pocketing;Small sips/bites;Slow rate    Other  Recommendations Oral Care Recommendations: Oral care BID   Follow Up Recommendations       Frequency and Duration        Pertinent Vitals/Pain none    SLP Swallow Goals     Swallow Study Prior Functional Status       General Other Pertinent Information: 81 year old male with hypertension, hypothyroidism, coronary disease, hyperlipidemia, spinal stenosis, chronic low back pain, UTI diagnosis 11/1 who was recently discharged on 10/11 when he was admitted for urinary retention. Patient admitted with worsened confusion and weakness for the last 2-3 weeks. CXR left base subsegmental atelectasis. No prior ST documentation found. BSE ordered due to daughter reports pt coughing with meals.  Type of Study: Bedside swallow evaluation Previous Swallow Assessment:  (none) Diet Prior to this Study: Regular;Thin liquids Temperature Spikes Noted: Yes Respiratory Status: Room air History  of Recent Intubation: No Behavior/Cognition: Alert;Cooperative;Pleasant mood;Confused;Requires cueing Oral Cavity - Dentition:  (upper/lower partial) Self-Feeding Abilities: Needs assist;Needs set up;Able to feed self Patient Positioning:  Upright in bed Baseline Vocal Quality: Normal Volitional Cough: Weak Volitional Swallow: Able to elicit    Oral/Motor/Sensory Function Overall Oral Motor/Sensory Function:  (jerky movements, dec coordination, lingual/labial)   Ice Chips Ice chips: Not tested   Thin Liquid Thin Liquid: Within functional limits Presentation: Cup;Straw    Nectar Thick Nectar Thick Liquid: Not tested   Honey Thick Honey Thick Liquid: Not tested   Puree Puree: Not tested   Solid   GO    Solid: Within functional limits       Jeremy [name], Jeremy [name] 11/11/2014,9:48 AM   Jeremy [name] M.Ed [employer] Pager [phone]

## 2014-11-11 NOTE — Telephone Encounter (Signed)
Jeremy PoundDeborah Meadows/daughter [phone] called to advise, [facility] and MRI was done in hospital.

## 2014-11-11 NOTE — Care Management Important Message (Signed)
Important Message  Patient Details  Name: Jeremy [name] MRN: [id number] Date of Birth: October 28, 1925   Medicare Important Message Given:  Yes-third notification given    RoyalAnnamarie Major, Jaelynn Pozo U, RN 11/11/2014, 2:40 PM

## 2014-11-11 NOTE — Progress Notes (Signed)
Report called to [facility]. S/W "Judeth CornfieldStephanie". Barbera Settersurner, Keny Donald B

## 2014-11-11 NOTE — Discharge Summary (Signed)
Physician Discharge Summary  Jeremy [name] MRN: [id number] DOB/AGE: [birth year] 81 y.o.  PCP: Maximino Greenland, MD   Admit date: 11/06/2014 Discharge date: 11/11/2014  Discharge Diagnoses:     Principal Problem:   Acute encephalopathy Active Problems:   Coronary artery disease   Essential hypertension   Hyperlipidemia   Right shoulder pain   UTI (lower urinary tract infection)    Follow-up recommendations Follow-up with PCP in 3-5 days , including all  additional recommended appointments as below Follow-up CBC, CMP in 3-5 days  Diet Recommendation Age appropriate regular solids;Thin   Medication Administration: Crushed with puree Compensations: Check for pocketing;Small sips/bites;Slow rate             Medication List    STOP taking these medications        cephALEXin 500 MG capsule  Commonly known as:  KEFLEX      TAKE these medications        amoxicillin-clavulanate 875-125 MG tablet  Commonly known as:  AUGMENTIN  Take 1 tablet by mouth every 12 (twelve) hours.     aspirin EC 81 MG tablet  Take 81 mg by mouth daily.     clopidogrel 75 MG tablet  Commonly known as:  PLAVIX  Take 1 tablet (75 mg total) by mouth daily.     dorzolamide 2 % ophthalmic solution  Commonly known as:  TRUSOPT  Place 1 drop into the right eye 2 (two) times daily.     finasteride 5 MG tablet  Commonly known as:  PROSCAR  Take 1 tablet (5 mg total) by mouth daily.     guaiFENesin-dextromethorphan 100-10 MG/5ML syrup  Commonly known as:  ROBITUSSIN DM  Take 5 mLs by mouth every 4 (four) hours as needed for cough.     isosorbide mononitrate 30 MG 24 hr tablet  Commonly known as:  IMDUR  Take 1 tablet (30 mg total) by mouth daily.     latanoprost 0.005 % ophthalmic solution  Commonly known as:  XALATAN  Place 1 drop into both eyes at bedtime.     levothyroxine 75 MCG tablet  Commonly known as:  SYNTHROID, LEVOTHROID  Take 75 mcg by mouth daily.     losartan 50 MG  tablet  Commonly known as:  COZAAR  Take 50 mg by mouth daily.     metoprolol succinate 50 MG 24 hr tablet  Commonly known as:  TOPROL-XL  Take 25 mg by mouth daily. Take with or immediately following a meal.     mupirocin ointment 2 %  Commonly known as:  BACTROBAN  Place into the nose 2 (two) times daily.     QUEtiapine 25 MG tablet  Commonly known as:  SEROQUEL  One to 2 tabs po qhs.     ranitidine 75 MG tablet  Commonly known as:  ZANTAC  Take 75 mg by mouth daily.     simvastatin 40 MG tablet  Commonly known as:  ZOCOR  Take 40 mg by mouth daily at 6 PM.     tamsulosin 0.4 MG Caps capsule  Commonly known as:  FLOMAX  Take 0.4 mg by mouth daily.     timolol 0.5 % ophthalmic solution  Commonly known as:  TIMOPTIC  Place 1 drop into both eyes 2 (two) times daily.         Discharge Condition: Stable Discharge Instructions     Allergies  Allergen Reactions  . Tramadol Nausea And Vomiting      Disposition:  01-Home or Self Care   Consults:      Significant Diagnostic Studies:  Dg Chest 2 View  11/06/2014  CLINICAL DATA:  Altered mental status. EXAM: CHEST  2 VIEW COMPARISON:  January 26, 2014. FINDINGS: The heart size and mediastinal contours are within normal limits. Both lungs are clear. No pneumothorax or pleural effusion is noted. The visualized skeletal structures are unremarkable. IMPRESSION: No active cardiopulmonary disease. Electronically Signed   By: Marijo Conception, M.D.   On: 11/06/2014 13:56   Dg Shoulder Right  11/06/2014  CLINICAL DATA:  Acute right shoulder pain without reported injury. EXAM: RIGHT SHOULDER - 2+ VIEW COMPARISON:  None. FINDINGS: There is no evidence of fracture or dislocation. There is no evidence of arthropathy or other focal bone abnormality. Soft tissues are unremarkable. IMPRESSION: Normal right shoulder. Electronically Signed   By: Marijo Conception, M.D.   On: 11/06/2014 15:03   Ct Head Wo Contrast  10/22/2014   CLINICAL DATA:  Difficulty with speech. Worsened memory. Altered mental status. EXAM: CT HEAD WITHOUT CONTRAST TECHNIQUE: Contiguous axial images were obtained from the base of the skull through the vertex without intravenous contrast. COMPARISON:  02/09/2012 FINDINGS: Sinuses/Soft tissues: Surgical changes about both globes. Mucosal thickening of ethmoid air cells. Hypoplastic frontal sinuses. Clear mastoid air cells. Intracranial: Moderate low density in the periventricular white matter likely related to small vessel disease. No mass lesion, hemorrhage, hydrocephalus, acute infarct, intra-axial, or extra-axial fluid collection. IMPRESSION: 1.  No acute intracranial abnormality. 2. Moderate small vessel ischemic change. 3. Mild sinus disease. Electronically Signed   By: Abigail Miyamoto M.D.   On: 10/22/2014 14:15   Mr Brain Wo Contrast  11/06/2014  CLINICAL DATA:  81 year old male with hypertension, worsening confusion in weakness. Acute encephalopathy. Initial encounter. EXAM: MRI HEAD WITHOUT CONTRAST TECHNIQUE: Multiplanar, multiecho pulse sequences of the brain and surrounding structures were obtained without intravenous contrast. COMPARISON:  Head CT without contrast 10/22/2014, and earlier. FINDINGS: Study is intermittently degraded by motion artifact despite repeated imaging attempts. Cerebral volume is within normal limits for age. No restricted diffusion to suggest acute infarction. No midline shift, mass effect, evidence of mass lesion, ventriculomegaly, extra-axial collection or acute intracranial hemorrhage. Cervicomedullary junction and pituitary are within normal limits. Major intracranial vascular flow voids are within normal limits. Patchy and confluent cerebral white matter T2 and FLAIR hyperintensity. No cortical encephalomalacia or chronic cerebral blood products identified. Deep gray matter nuclei, brainstem and cerebellum are within normal limits for age. Negative visualized auditory  structures. Mastoids and paranasal sinuses are clear. Postoperative changes to both globes. Negative orbit and scalp soft tissues. Visualized bone marrow signal is within normal limits. IMPRESSION: No acute intracranial abnormality. Nonspecific cerebral white matter changes. Electronically Signed   By: Genevie Ann M.D.   On: 11/06/2014 17:29   Dg Chest Port 1 View  11/10/2014  CLINICAL DATA:  81 year old male with fever.  Subsequent encounter. EXAM: PORTABLE CHEST 1 VIEW COMPARISON:  11/06/2014 chest x-ray. FINDINGS: Minimal increased markings left lung base have an appearance more suggestive of subsegmental atelectasis rather than infiltrate. Mildly tortuous aorta. Heart size within normal limits. IMPRESSION: Left base subsegmental atelectasis. Mildly tortuous thoracic aorta. Electronically Signed   By: Genia Del M.D.   On: 11/10/2014 08:29      Filed Weights   11/08/14 2133 11/09/14 2058 11/10/14 2032  Weight: 68.4 kg (150 lb 12.7 oz) 68.2 kg (150 lb 5.7 oz) 68 kg (149 lb 14.6 oz)  Microbiology: Recent Results (from the past 240 hour(s))  Urine culture     Status: None   Collection Time: 11/03/14  9:31 PM  Result Value Ref Range Status   Specimen Description URINE, CATHETERIZED  Final   Special Requests ADDED 0107 11/04/14  Final   Culture MULTIPLE SPECIES PRESENT, SUGGEST RECOLLECTION  Final   Report Status 11/05/2014 FINAL  Final  Urine culture     Status: None   Collection Time: 11/06/14  1:45 PM  Result Value Ref Range Status   Specimen Description URINE, RANDOM  Final   Special Requests NONE  Final   Culture NO GROWTH 1 DAY  Final   Report Status 11/08/2014 FINAL  Final  MRSA PCR Screening     Status: None   Collection Time: 11/06/14  9:30 PM  Result Value Ref Range Status   MRSA by PCR NEGATIVE NEGATIVE Final    Comment:        The GeneXpert MRSA Assay (FDA approved for NASAL specimens only), is one component of a comprehensive MRSA colonization surveillance program.  It is not intended to diagnose MRSA infection nor to guide or monitor treatment for MRSA infections.   Culture, blood (routine x 2)     Status: None (Preliminary result)   Collection Time: 11/07/14 12:05 PM  Result Value Ref Range Status   Specimen Description BLOOD RIGHT HAND  Final   Special Requests BOTTLES DRAWN AEROBIC ONLY 5CC  Final   Culture NO GROWTH 4 DAYS  Final   Report Status PENDING  Incomplete  Urine culture     Status: None   Collection Time: 11/08/14  3:34 AM  Result Value Ref Range Status   Specimen Description URINE, CATHETERIZED  Final   Special Requests NONE  Final   Culture NO GROWTH 1 DAY  Final   Report Status 11/09/2014 FINAL  Final       Blood Culture    Component Value Date/Time   SDES URINE, CATHETERIZED 11/08/2014 0334   SPECREQUEST NONE 11/08/2014 0334   CULT NO GROWTH 1 DAY 11/08/2014 0334   REPTSTATUS 11/09/2014 FINAL 11/08/2014 0334      Labs: Results for orders placed or performed during the hospital encounter of 11/06/14 (from the past 48 hour(s))  CBC     Status: None   Collection Time: 11/10/14  8:31 AM  Result Value Ref Range   WBC 9.2 4.0 - 10.5 K/uL   RBC 4.54 4.22 - 5.81 MIL/uL   Hemoglobin 13.2 13.0 - 17.0 g/dL   HCT 39.6 39.0 - 52.0 %   MCV 87.2 78.0 - 100.0 fL   MCH 29.1 26.0 - 34.0 pg   MCHC 33.3 30.0 - 36.0 g/dL   RDW 14.3 11.5 - 15.5 %   Platelets 167 150 - 400 K/uL  Basic metabolic panel     Status: Abnormal   Collection Time: 11/10/14  8:31 AM  Result Value Ref Range   Sodium 136 135 - 145 mmol/L   Potassium 3.4 (L) 3.5 - 5.1 mmol/L   Chloride 104 101 - 111 mmol/L   CO2 18 (L) 22 - 32 mmol/L   Glucose, Bld 107 (H) 65 - 99 mg/dL   BUN 13 6 - 20 mg/dL   Creatinine, Ser 0.90 0.61 - 1.24 mg/dL   Calcium 8.7 (L) 8.9 - 10.3 mg/dL   GFR calc non Af Amer >60 >60 mL/min   GFR calc Af Amer >60 >60 mL/min    Comment: (NOTE) The eGFR has  been calculated using the CKD EPI equation. This calculation has not been  validated in all clinical situations. eGFR's persistently <60 mL/min signify possible Chronic Kidney Disease.    Anion gap 14 5 - 15     Lipid Panel  No results found for: CHOL, TRIG, HDL, CHOLHDL, VLDL, LDLCALC, LDLDIRECT   Lab Results  Component Value Date   HGBA1C 6.4* 10/13/2014     Lab Results  Component Value Date   CREATININE 0.90 11/10/2014     HPI :81 year old male with hypertension, hypothyroidism, coronary disease, hyperlipidemia, spinal stenosis, chronic low back pain who was recently discharged on 10/11 when he was admitted for urinary retention. Patient presented to ED as patient was having progressively worsened confusion and weakness for the last 2-3 weeks. Patient is unable to provide any history, is confused and does not know where he is. History was obtained from the patient's daughters in the room. Patient's daughter reported that he was having progressively increasing confusion and weakness, he was seen in the ED on 11/1 and was diagnosed with UTI. Patient had a scheduled follow-up with urology today however he was too weak and was recommended by urology to go to the ER. Per patient's daughter, he was so weak in the last few days and that patient was unable to feed himself or ambulate. Patient's daughter also noticed foul-smelling, urine. No fevers or chills. Patient's family also noticed right shoulder pain this morning. ER workup reviewed, CBC, BMET unremarkable, UA positive for UTI. Patient's family is unable to take care of him at home.    HOSPITAL COURSE:   Acute encephalopathy; in the setting of UTI,/dehydration/ possibly has dementia/parkinsons? - MRI negative  Treated with Rocephin for UTI- no growth on culture -blood culture NGTD -RPR, HIV, ammonia ok B12 low end of normal-- replace neurology consult: MRI/EEG- metabolic encephalopathy  Fever -11/8 - x ray shows atelectasis- added incentive spirometry -daughter reports coughing with meals -SLP  recommends aspiration precautions, regular diet with thin liquids   Urinary tract infection - rocephin- change to augmentin for possible aspiration PNA 7 more days  AKI Patient has a history of urinary retention and was admitted with a chronic indwelling Foley Two-weak to go for his urology appointment, continue indwelling Foley at discharge today Patient needs outpatient follow-up with urology Foley catheter change during this admission Creatinine has improved from 1.66> 0.9   Coronary artery disease - Currently stable, no complaints of chest pain or shortness of breath, continue aspirin, beta blocker, losartan, Imdur, Plavix, statin.   Essential hypertension - Currently stable, continue losartan, metoprolol, Imdur   Hyperlipidemia - Continue statin   Right shoulder pain -shoulder x-ray normal, PT OT evaluation recommended SNF   Discharge Exam   Blood pressure 129/81, pulse 102, temperature 98.2 F (36.8 C), temperature source Oral, resp. rate 118, weight 68 kg (149 lb 14.6 oz), SpO2 99 %.   General: Awake, pleasantly confused- blind  Cardiovascular: rrr  Respiratory: clear  Abdomen: +BS, soft  Musculoskeletal: no edema        Follow-up Information    Follow up with Maximino Greenland, MD. Schedule an appointment as soon as possible for a visit in 3 days.   Specialty:  Internal Medicine   Contact information:   [address] [place] [address] [phone]       Signed: Reyne Dumas 11/11/2014, 12:31 PM        Time spent >45 mins

## 2014-11-11 NOTE — Telephone Encounter (Signed)
Spoke to patient's daughter, Jeremy [name] (on HIPPA) - states her father's confusion continued to worsen and she took him to the ED.  He is being treated for a UTI.  He is scheduled to be released today and will be going to skilled care, temporarily, while he is recovering.  He had his MRI brain scan while at the hospital.

## 2014-11-12 ENCOUNTER — Non-Acute Institutional Stay (SKILLED_NURSING_FACILITY): Payer: Medicare Other | Admitting: Internal Medicine

## 2014-11-12 DIAGNOSIS — R531 Weakness: Secondary | ICD-10-CM

## 2014-11-12 DIAGNOSIS — I10 Essential (primary) hypertension: Secondary | ICD-10-CM | POA: Diagnosis not present

## 2014-11-12 DIAGNOSIS — H409 Unspecified glaucoma: Secondary | ICD-10-CM | POA: Diagnosis not present

## 2014-11-12 DIAGNOSIS — I2510 Atherosclerotic heart disease of native coronary artery without angina pectoris: Secondary | ICD-10-CM

## 2014-11-12 DIAGNOSIS — R1310 Dysphagia, unspecified: Secondary | ICD-10-CM | POA: Diagnosis not present

## 2014-11-12 DIAGNOSIS — E890 Postprocedural hypothyroidism: Secondary | ICD-10-CM

## 2014-11-12 DIAGNOSIS — R338 Other retention of urine: Secondary | ICD-10-CM

## 2014-11-12 DIAGNOSIS — E46 Unspecified protein-calorie malnutrition: Secondary | ICD-10-CM | POA: Diagnosis not present

## 2014-11-12 DIAGNOSIS — G9341 Metabolic encephalopathy: Secondary | ICD-10-CM

## 2014-11-12 DIAGNOSIS — N401 Enlarged prostate with lower urinary tract symptoms: Secondary | ICD-10-CM

## 2014-11-12 DIAGNOSIS — N400 Enlarged prostate without lower urinary tract symptoms: Secondary | ICD-10-CM | POA: Diagnosis not present

## 2014-11-12 DIAGNOSIS — I2583 Coronary atherosclerosis due to lipid rich plaque: Secondary | ICD-10-CM

## 2014-11-12 DIAGNOSIS — K219 Gastro-esophageal reflux disease without esophagitis: Secondary | ICD-10-CM | POA: Diagnosis not present

## 2014-11-12 DIAGNOSIS — N390 Urinary tract infection, site not specified: Secondary | ICD-10-CM

## 2014-11-12 LAB — CULTURE, BLOOD (ROUTINE X 2): CULTURE: NO GROWTH

## 2014-11-12 LAB — CBC AND DIFFERENTIAL
HEMATOCRIT: 36 % — AB (ref 41–53)
HEMOGLOBIN: 11.8 g/dL — AB (ref 13.5–17.5)
Platelets: 243 10*3/uL (ref 150–399)
WBC: 7.6 10^3/mL

## 2014-11-12 NOTE — Progress Notes (Signed)
Patient ID: Jeremy [name], male   DOB: 07-15-1925, 81 y.o.   MRN: [id number]     Facility: [facility]    PCP: [name], MD  Code Status: full code  Allergies  Allergen Reactions  . Tramadol Nausea And Vomiting    Chief Complaint  Patient presents with  . New Admit To SNF     HPI:  81 y.o. patient is here for short term rehabilitation post hospital admission from 11/06/14-11/11/14 with acute encephalopathy along with dehydration and UTI. He was started on iv antibiotics and then transitioned to po augmentin. He had brain imaging which was negative for acute intracranial abnormalities. He had EEG showing metabolic encephalopathy. He was seen by SLP team with concerns for aspiration and started on regular diet with thin liquids. He has PMH of urinary retention with foley, HTN, CAD, low back pain and spinal stenosis among others. He is seen in his room today with his daughter present. Daughter feels that he is very weak and his condition is not much changed from the hospital. He was living independently with his wife, walked around without assistance and did activities of daily living except cooking prior to this hospital admission. Today, he minimally participates in HPI and ROS and gives one word answer. Therapy worked with him this am and he is currently 2 person maximum assist.   Review of Systems: unable to obtain, patient is sitting on a wheelchair, mumbles few words, in no distress but is somnolent   Past Medical History  Diagnosis Date  . Bleeding ulcer   . Spinal stenosis   . Thyroid disease   . Hypertension   . Coronary artery disease     non-ST segment elevation myocardial infarction February of 2004 she was stenting of the circumflex using a Cypher drug-eluting stent.  . Hyperlipidemia   . Claudication (HCC)   . Macular degeneration   . Altered mental status    Past Surgical History  Procedure Laterality Date  . Coronary angioplasty  with stent placement    . Thyroidectomy, partial    . Renal doppler  08/17/2005    Normal evaluation  . Cardiac catheterization  02/25/2002    Proximal L Circumflex 95% lesion, stented w/ 3x18-mm CYPHER stent avoiding the 2 L Marginal branch, jailing the 1st marginal, resulting in reduction of a 90-95% lesion to 0% residual  . Cardiovascular stress test  12/18/2006    EKG negative for ischemia, no significant ischemia demonstrated.  . Transthoracic echocardiogram  03/13/2002    EF >55%, moderate LVH,   . Back surgery     Social History:   reports that he has quit smoking. He has never used smokeless tobacco. He reports that he does not drink alcohol or use illicit drugs.  Family History  Problem Relation Age of Onset  . Family history unknown: Yes    Medications:   Medication List       This list is accurate as of: 11/12/14  1:10 PM.  Always use your most recent med list.               amoxicillin-clavulanate 875-125 MG tablet  Commonly known as:  AUGMENTIN  Take 1 tablet by mouth every 12 (twelve) hours.     aspirin EC 81 MG tablet  Take 81 mg by mouth daily.     clopidogrel 75 MG tablet  Commonly known as:  PLAVIX  Take 1 tablet (75 mg total) by mouth daily.  dorzolamide 2 % ophthalmic solution  Commonly known as:  TRUSOPT  Place 1 drop into the right eye 2 (two) times daily.     finasteride 5 MG tablet  Commonly known as:  PROSCAR  Take 1 tablet (5 mg total) by mouth daily.     guaiFENesin-dextromethorphan 100-10 MG/5ML syrup  Commonly known as:  ROBITUSSIN DM  Take 5 mLs by mouth every 4 (four) hours as needed for cough.     isosorbide mononitrate 30 MG 24 hr tablet  Commonly known as:  IMDUR  Take 1 tablet (30 mg total) by mouth daily.     latanoprost 0.005 % ophthalmic solution  Commonly known as:  XALATAN  Place 1 drop into both eyes at bedtime.     levothyroxine 75 MCG tablet  Commonly known as:  SYNTHROID, LEVOTHROID  Take 75 mcg by mouth  daily.     losartan 50 MG tablet  Commonly known as:  COZAAR  Take 50 mg by mouth daily.     metoprolol succinate 50 MG 24 hr tablet  Commonly known as:  TOPROL-XL  Take 25 mg by mouth daily. Take with or immediately following a meal.     mupirocin ointment 2 %  Commonly known as:  BACTROBAN  Place into the nose 2 (two) times daily.     QUEtiapine 25 MG tablet  Commonly known as:  SEROQUEL  One to 2 tabs po qhs.     ranitidine 75 MG tablet  Commonly known as:  ZANTAC  Take 75 mg by mouth daily.     simvastatin 40 MG tablet  Commonly known as:  ZOCOR  Take 40 mg by mouth daily at 6 PM.     tamsulosin 0.4 MG Caps capsule  Commonly known as:  FLOMAX  Take 0.4 mg by mouth daily.     timolol 0.5 % ophthalmic solution  Commonly known as:  TIMOPTIC  Place 1 drop into both eyes 2 (two) times daily.         Physical Exam: Filed Vitals:   11/12/14 1309  BP: 131/85  Pulse: 85  Temp: 98.4 F (36.9 C)  Resp: 18  SpO2: 98%    General- elderly male, thin built, in no acute distress Head- normocephalic, atraumatic Nose- normal nasal mucosa, no maxillary or frontal sinus tenderness, no nasal discharge Throat- moist mucus membrane  Eyes- no pallor, no icterus, no discharge, normal conjunctiva, normal sclera Neck- no cervical lymphadenopathy Cardiovascular- normal s1,s2, no murmurs, no leg edema Respiratory- bilateral clear to auscultation, no wheeze, no rhonchi, no crackles, no use of accessory muscles Abdomen- bowel sounds present, soft, non tender Musculoskeletal- able to move all 4 extremities, generalized weakness, unsteady gait Neurological- somnolent, oriented only to place     Labs reviewed: Basic Metabolic Panel:  Recent Labs  16/10/9609/05/16 0510 11/08/14 0030 11/10/14 0831  NA 138 140 136  K 3.6 3.3* 3.4*  CL 106 109 104  CO2 20* 20* 18*  GLUCOSE 108* 97 107*  BUN 13 15 13   CREATININE 1.66* 1.36* 0.90  CALCIUM 8.6* 8.8* 8.7*   Liver Function  Tests:  Recent Labs  10/22/14 1343 11/03/14 2015 11/06/14 1256  AST 25 25 30   ALT 15* 13* 12*  ALKPHOS 78 85 74  BILITOT 0.7 0.6 1.0  PROT 7.5 7.8 7.9  ALBUMIN 3.4* 3.4* 3.4*   No results for input(s): LIPASE, AMYLASE in the last 8760 hours.  Recent Labs  11/07/14 0910  AMMONIA 25   CBC:  Recent Labs  01/26/14 1632 01/27/14 0855  10/11/14 1129  11/07/14 0510 11/08/14 0030 11/10/14 0831  WBC 5.6 9.8  < > 9.4  < > 8.7 7.7 9.2  NEUTROABS 2.0 7.2  --  7.5  --   --   --   --   HGB 13.6 12.1*  < > 14.1  < > 12.8* 14.6 13.2  HCT 42.7 38.3*  < > 43.5  < > 38.5* 41.9 39.6  MCV 93.4 91.8  < > 90.4  < > 87.7 86.4 87.2  PLT 145* 131*  < > 168  < > 173 179 167  < > = values in this interval not displayed. Cardiac Enzymes:  Recent Labs  01/26/14 2330 01/27/14 0300 01/27/14 0855  TROPONINI <0.03 0.03 0.03   BNP: Invalid input(s): POCBNP CBG:  Recent Labs  10/22/14 1252 11/06/14 1205  GLUCAP 80 121*   Lab Results  Component Value Date   TSH 3.056 11/07/2014      Radiological Exams: Dg Chest 2 View  11/06/2014  CLINICAL DATA:  Altered mental status. EXAM: CHEST  2 VIEW COMPARISON:  January 26, 2014. FINDINGS: The heart size and mediastinal contours are within normal limits. Both lungs are clear. No pneumothorax or pleural effusion is noted. The visualized skeletal structures are unremarkable. IMPRESSION: No active cardiopulmonary disease. Electronically Signed   By: Lupita Raider, M.D.   On: 11/06/2014 13:56   Dg Shoulder Right  11/06/2014  CLINICAL DATA:  Acute right shoulder pain without reported injury. EXAM: RIGHT SHOULDER - 2+ VIEW COMPARISON:  None. FINDINGS: There is no evidence of fracture or dislocation. There is no evidence of arthropathy or other focal bone abnormality. Soft tissues are unremarkable. IMPRESSION: Normal right shoulder. Electronically Signed   By: Lupita Raider, M.D.   On: 11/06/2014 15:03   Mr Brain Wo Contrast  11/06/2014   CLINICAL DATA:  81 year old male with hypertension, worsening confusion in weakness. Acute encephalopathy. Initial encounter. EXAM: MRI HEAD WITHOUT CONTRAST TECHNIQUE: Multiplanar, multiecho pulse sequences of the brain and surrounding structures were obtained without intravenous contrast. COMPARISON:  Head CT without contrast 10/22/2014, and earlier. FINDINGS: Study is intermittently degraded by motion artifact despite repeated imaging attempts. Cerebral volume is within normal limits for age. No restricted diffusion to suggest acute infarction. No midline shift, mass effect, evidence of mass lesion, ventriculomegaly, extra-axial collection or acute intracranial hemorrhage. Cervicomedullary junction and pituitary are within normal limits. Major intracranial vascular flow voids are within normal limits. Patchy and confluent cerebral white matter T2 and FLAIR hyperintensity. No cortical encephalomalacia or chronic cerebral blood products identified. Deep gray matter nuclei, brainstem and cerebellum are within normal limits for age. Negative visualized auditory structures. Mastoids and paranasal sinuses are clear. Postoperative changes to both globes. Negative orbit and scalp soft tissues. Visualized bone marrow signal is within normal limits. IMPRESSION: No acute intracranial abnormality. Nonspecific cerebral white matter changes. Electronically Signed   By: Odessa Fleming M.D.   On: 11/06/2014 17:29    Assessment/Plan  Generalized weakness Recent UTI and metabolic encephalopathy could be contributing to this. Will have him work with physical therapy and occupational therapy team to help with gait training and muscle strengthening exercises.fall precautions. Skin care. Encourage to be out of bed. To provide assistance with ADLs for now  Metabolic encephalopathy Currently alert and oriented only to place. Discontinue seroquel for now. Continue and complete course of antibiotic. Check cbc with diff, cmp. tsh and  ammonia level were recently checked  and is stable.   UTI Continue and complete course of augmentin on 11/17/14, to encourage hydration  Protein calorie malnutrition Low albumin, get dietary consult and add procel 1 scoop bid with lunch and dinner for now. Assistance with feeding and weekly weight  Dysphagia Aspiration precaution, continue regular diet and thin liquids  CAD Remains chest pain free. Continue plavix and aspirin. Continue imdur 30 mg daily, losartan 50 mg daily, toprol xl 25 mg daily along with statin  Glaucoma Continue dorzolamide eye drops and latanoprost  BPH with urinary retention Continue foley catheter, continue foley care. Continue proscar 5 mg daily and flomax  HTN Monitor bp, continue imdur 30 mg daily, losartan 50 mg daily, toprol xl 25 mg daily and have holding parameters for BP medication.   Hypothyroidism Continue levothyroxine 75 mcg daily  gerd Continue zantac 75 mg daily    Goals of care: short term rehabilitation   Labs/tests ordered: cbc with diff, cmp  Family/ staff Communication: reviewed care plan with patient, daughter and nursing supervisor    Oneal Grout, MD  [facility] [phone] (Monday-Friday 8 am - 5 pm) [phone] (afterhours)

## 2014-11-13 LAB — BASIC METABOLIC PANEL
BUN: 19 mg/dL (ref 4–21)
Creatinine: 0.9 mg/dL (ref <=1.3)
GLUCOSE: 95 mg/dL
Potassium: 3.7 mmol/L (ref 3.4–5.3)
SODIUM: 141 mmol/L (ref 137–147)

## 2014-11-15 ENCOUNTER — Other Ambulatory Visit: Payer: Medicare Other

## 2014-11-23 ENCOUNTER — Encounter: Payer: Self-pay | Admitting: Neurology

## 2014-11-23 ENCOUNTER — Ambulatory Visit (INDEPENDENT_AMBULATORY_CARE_PROVIDER_SITE_OTHER): Payer: Medicare Other | Admitting: Neurology

## 2014-11-23 VITALS — BP 88/59 | HR 110

## 2014-11-23 DIAGNOSIS — R269 Unspecified abnormalities of gait and mobility: Secondary | ICD-10-CM | POA: Diagnosis not present

## 2014-11-23 DIAGNOSIS — F0390 Unspecified dementia without behavioral disturbance: Secondary | ICD-10-CM

## 2014-11-23 DIAGNOSIS — F03918 Unspecified dementia, unspecified severity, with other behavioral disturbance: Secondary | ICD-10-CM | POA: Insufficient documentation

## 2014-11-23 DIAGNOSIS — F0391 Unspecified dementia with behavioral disturbance: Secondary | ICD-10-CM | POA: Insufficient documentation

## 2014-11-23 NOTE — Progress Notes (Signed)
Chief Complaint  Patient presents with  . Altered Mental Status    MMSE - animals. He is here with his daughter, [name] He is currently being treated for his UTI and he is temporarily at [facility] while receiving treatment.  His confusion his still a problem.  He was recently hospitalized and underwent testing while there.      PATIENT: Jeremy [name] DOB: 14-Dec-1925  Chief Complaint  Patient presents with  . Altered Mental Status    MMSE - animals. He is here with his daughter, [name] He is currently being treated for his UTI and he is temporarily at [facility] while receiving treatment.  His confusion his still a problem.  He was recently hospitalized and underwent testing while there.     HISTORICAL  Jeremy [name] a 81 years old right-handed male, seen in refer by  His primary care physician Jeremy [name], accompanied by his daughter Jeremy [name] in October 31st 2016 for evaluation of memory trouble, confusion, difficulty with language  He gradudate from high school, worked at [employer], retired at 65, continue to be active, enjoyed traveling, bowling,was doing exercise classes regularly until end of September, he had fairly acute onset gait difficulty, urinary retention, was also found to be confused, difficulty talking,  He was admitted to hospital in October 9 to 11 2016 for right hip pain,lower abdominal pain, CAT scan showed right hip arthritis. I have personally reviewed CAT scan of brain, moderate periventricular small vessel disease, no acute lesions.  He is now back home with home rehabilitation, ambulate with a walker, had Foley catheter in, going through urology evaluation, he has lost both of his vision around 2013 due to glaucoma, macular degenerations, was highly function in with his visual loss, able to fix simple breakfast use microwave. But now he ambulate with a walker, unsteady stiff gait, needing more help in daily activity.     UPDATE Nov 23 2014: He is with his daughter Jeremy [name] at today's clinical visit, he was recently admitted to hospital for UTI in November 2016, urinary retention, had Foley catheter placed ever since, daughter reported elevated PSA, is under close observation of urologist Since hospital admission in November, he has increased confusion, he is now discharged to [facility] place, he could no longer feed himself, poor vision, need assistant to get up to take a few steps, poor appetite, Workup reviewed, CBC, BMET unremarkable, UA positive for UTI.normal TSH, B12, negative HIV, RPR I have personally reviewed MRI of the brain November 2016: No acute intracranial abnormality. Nonspecific cerebral white matter changes.  REVIEW OF SYSTEMS: Full 14 system review of systems performed and notable only for speech difficulty, memory loss, weakness, agitation, confusion, hallucinations, decreased urination, sleep walking, acting out of dreams, appetite change, eye redness.  ALLERGIES: Allergies  Allergen Reactions  . Tramadol Nausea And Vomiting    HOME MEDICATIONS: Current Outpatient Prescriptions  Medication Sig Dispense Refill  . amoxicillin-clavulanate (AUGMENTIN) 875-125 MG tablet Take 1 tablet by mouth every 12 (twelve) hours. 14 tablet 0  . aspirin EC 81 MG tablet Take 81 mg by mouth daily.    . clopidogrel (PLAVIX) 75 MG tablet Take 1 tablet (75 mg total) by mouth daily. 90 tablet 3  . dorzolamide (TRUSOPT) 2 % ophthalmic solution Place 1 drop into the right eye 2 (two) times daily.     . finasteride (PROSCAR) 5 MG tablet Take 1 tablet (5 mg total) by mouth daily. (Patient  taking differently: Take 5 mg by mouth every evening. ) 30 tablet 0  . guaiFENesin-dextromethorphan (ROBITUSSIN DM) 100-10 MG/5ML syrup Take 5 mLs by mouth every 4 (four) hours as needed for cough. 118 mL 0  . isosorbide mononitrate (IMDUR) 30 MG 24 hr tablet Take 1 tablet (30 mg total) by mouth daily. 90 tablet 3  . latanoprost  (XALATAN) 0.005 % ophthalmic solution Place 1 drop into both eyes at bedtime.    Marland Kitchen levothyroxine (SYNTHROID, LEVOTHROID) 75 MCG tablet Take 75 mcg by mouth daily.  0  . losartan (COZAAR) 50 MG tablet Take 50 mg by mouth daily.     . metoprolol succinate (TOPROL-XL) 50 MG 24 hr tablet Take 25 mg by mouth daily. Take with or immediately following a meal.    . mupirocin ointment (BACTROBAN) 2 % Place into the nose 2 (two) times daily. 22 g 0  . QUEtiapine (SEROQUEL) 25 MG tablet One to 2 tabs po qhs. (Patient taking differently: Take 25 mg by mouth at bedtime. ) 60 tablet 3  . ranitidine (ZANTAC) 75 MG tablet Take 75 mg by mouth daily.    . simvastatin (ZOCOR) 40 MG tablet Take 40 mg by mouth daily at 6 PM.     . tamsulosin (FLOMAX) 0.4 MG CAPS capsule Take 0.4 mg by mouth daily.  0  . timolol (TIMOPTIC) 0.5 % ophthalmic solution Place 1 drop into both eyes 2 (two) times daily.      No current facility-administered medications for this visit.    PAST MEDICAL HISTORY: Past Medical History  Diagnosis Date  . Bleeding ulcer   . Spinal stenosis   . Thyroid disease   . Hypertension   . Coronary artery disease     non-ST segment elevation myocardial infarction February of 2004 she was stenting of the circumflex using a Cypher drug-eluting stent.  . Hyperlipidemia   . Claudication (HCC)   . Macular degeneration   . Altered mental status     PAST SURGICAL HISTORY: Past Surgical History  Procedure Laterality Date  . Coronary angioplasty with stent placement    . Thyroidectomy, partial    . Renal doppler  08/17/2005    Normal evaluation  . Cardiac catheterization  02/25/2002    Proximal L Circumflex 95% lesion, stented w/ 3x18-mm CYPHER stent avoiding the 2 L Marginal branch, jailing the 1st marginal, resulting in reduction of a 90-95% lesion to 0% residual  . Cardiovascular stress test  12/18/2006    EKG negative for ischemia, no significant ischemia demonstrated.  . Transthoracic  echocardiogram  03/13/2002    EF >55%, moderate LVH,   . Back surgery      FAMILY HISTORY: Family History  Problem Relation Age of Onset  . Family history unknown: Yes    SOCIAL HISTORY:  Social History   Social History  . Marital Status: Married    Spouse Name: N/A  . Number of Children: 4  . Years of Education: HS   Occupational History  . Retired    Social History Main Topics  . Smoking status: Former Games developer  . Smokeless tobacco: Never Used     Comment: Quit 50+ years ago.  . Alcohol Use: No  . Drug Use: No  . Sexual Activity: Not on file   Other Topics Concern  . Not on file   Social History Narrative   Lives with his wife.   Right-handed.   No caffeine use.     PHYSICAL EXAM   Filed  Vitals:    Not recorded      There is no weight on file to calculate BMI.  PHYSICAL EXAMNIATION:  Gen: NAD, conversant, well nourised, obese, well groomed                     Cardiovascular: Regular rate rhythm, no peripheral edema, warm, nontender. Eyes: Conjunctivae clear without exudates or hemorrhage Neck: Supple, no carotid bruise. Pulmonary: Clear to auscultation bilaterally   NEUROLOGICAL EXAM:  MENTAL STATUS: Speech:    Speech is normal; fluent and spontaneous with normal comprehension.  Cognition: Mini-Mental Status Examination 13 out of 27, animal naming is 6     Orientation to time, place and person: He is not oriented to date, year, date, season, Dr., state     Recent and remote memory: He missed 2 out of 3 recalls     Attention span and concentration: He could not spell world backwards     Normal Language, naming, repeating,spontaneous speech     He could not write or copy due to blind     CRANIAL NERVES: CN II:  left pupil was round, right pupil was irregular minimum reactive to light, he could not count fingers CN III, IV, VI: extraocular movement are normal. No ptosis. CN V: Facial sensation is intact to pinprick in all 3 divisions bilaterally.  Corneal responses are intact.  CN VII: Face is symmetric with normal eye closure and smile. CN VIII: Hearing is normal to rubbing fingers CN IX, X: Palate elevates symmetrically. Phonation is normal. CN XI: Head turning and shoulder shrug are intact CN XII: Tongue is midline with normal movements and no atrophy.  MOTOR: There is no pronator drift of out-stretched arms. Muscle bulk and tone are normal. Muscle strength is normal.  REFLEXES: Reflexes are 3 and symmetric at the biceps, triceps, knees, and ankles. Plantar responses are flexor.  SENSORY: Length dependent decreased to light touch, pinprick and vibratory sensation to distal shin  COORDINATION: Rapid alternating movements and fine finger movements are intact. There is no dysmetria on finger-to-nose and heel-knee-shin.    GAIT/STANCE: He needs 2 people assistant to get up from seated position, difficulty to initiate gait, DIAGNOSTIC DATA (LABS, IMAGING, TESTING) - I reviewed patient records, labs, notes, testing and imaging myself where available.   ASSESSMENT AND PLAN  Jeremy [name] is a 81 y.o. male   Acute onset of confusion, memory loss  He has vascular risk factor of aging,hypertension, hyperlipidemia, previous coronary artery disease  Keep aspirin daily  Differentiation diagnosis includes central nervous system degenerative disorder  Acute worsening confusion in the setting of urinary tract infection, new living environment, medicine side effect  Bilateral blindness  Due to glaucoma, macular degeneration Gait difficulty, urinary retention  Hyperreflexia, clear component of cervical spondylitic myelopathy     Jeremy [name], M.D. Ph.D.  [facility] [address], [address] [place], [place] [address] Ph: [phone] Fax: [phone]  CC: Dorothyann Pengobyn Sanders

## 2014-11-24 ENCOUNTER — Non-Acute Institutional Stay (SKILLED_NURSING_FACILITY): Payer: Medicare Other | Admitting: Internal Medicine

## 2014-11-24 DIAGNOSIS — R600 Localized edema: Secondary | ICD-10-CM

## 2014-11-24 DIAGNOSIS — E46 Unspecified protein-calorie malnutrition: Secondary | ICD-10-CM

## 2014-11-24 DIAGNOSIS — R1310 Dysphagia, unspecified: Secondary | ICD-10-CM | POA: Diagnosis not present

## 2014-11-24 DIAGNOSIS — R972 Elevated prostate specific antigen [PSA]: Secondary | ICD-10-CM | POA: Diagnosis not present

## 2014-11-24 DIAGNOSIS — F0150 Vascular dementia without behavioral disturbance: Secondary | ICD-10-CM | POA: Diagnosis not present

## 2014-11-24 DIAGNOSIS — R339 Retention of urine, unspecified: Secondary | ICD-10-CM

## 2014-11-24 DIAGNOSIS — R5381 Other malaise: Secondary | ICD-10-CM | POA: Diagnosis not present

## 2014-11-24 LAB — PSA: PSA: 7.25

## 2014-11-24 NOTE — Progress Notes (Signed)
Patient ID: Jeremy [name], male   DOB: 09-Feb-1925, 81 y.o.   MRN: [id number]    Facility: [facility]   Chief Complaint  Patient presents with  . Acute Visit    family concerns   Allergies  Allergen Reactions  . Tramadol Nausea And Vomiting    HPI:  81 y.o. patient is seen today with concerns from family. He was seen by his urologist recently for urinary retention. Family would like PSA and urodynamic studies done but urology office has decided not to perform it until 90 days post discharge from the facility for insurance reason per family. They would like a PSA checked here. They have concern about his overall prognosis and his progress with therapy. He has been needing minimum assist with PT but with frequent cueing and has made improvement. He still is moderate to maximum assist with OT. He was seen by neurology yesterday. Family have noticed his feet to be swollen and would like it evaluated.  Review of Systems:  Constitutional: Negative for fever, chills HENT: Negative for congestion, hearing loss and sore throat.   Eyes: has impaired vision Respiratory: Negative for cough, shortness of breath and wheezing.   Cardiovascular: Negative for chest pain, palpitations  Gastrointestinal: Negative for heartburn, nausea, vomiting.  Genitourinary: has foley in place Musculoskeletal: Negative for back pain, falls in the facility.  Skin: Negative for itching and rash.  Neurological: Negative for dizziness and headaches.  Psychiatric/Behavioral: positive for emory loss.    Past Medical History  Diagnosis Date  . Bleeding ulcer   . Spinal stenosis   . Thyroid disease   . Hypertension   . Coronary artery disease     non-ST segment elevation myocardial infarction February of 2004 she was stenting of the circumflex using a Cypher drug-eluting stent.  . Hyperlipidemia   . Claudication (HCC)   . Macular degeneration   . Altered mental status     Medications:  Medication reviewed. See MAR   Physical Exam: Filed Vitals:   11/24/14 1910  BP: 121/83  Pulse: 88  Temp: 98 F (36.7 C)  Resp: 18  SpO2: 97%    General- elderly male, thin built, in no acute distress Head- normocephalic, atraumatic Nose- normal nasal mucosa, no maxillary or frontal sinus tenderness, no nasal discharge Throat- moist mucus membrane  Eyes- no pallor, no icterus, no discharge, normal conjunctiva, normal sclera, Bilateral blindness Neck- no cervical lymphadenopathy Cardiovascular- normal s1,s2, no murmurs, trace feet edema Respiratory- bilateral clear to auscultation, no wheeze, no rhonchi, no crackles, no use of accessory muscles Abdomen- bowel sounds present, soft, non tender Musculoskeletal- able to move all 4 extremities, generalized weakness, unsteady gait Neurological- pleasantly confused   Labs reviewed: Basic Metabolic Panel:  Recent Labs  78/46/9609/05/17 0510 11/08/14 0030 11/10/14 0831  NA 138 140 136  K 3.6 3.3* 3.4*  CL 106 109 104  CO2 20* 20* 18*  GLUCOSE 108* 97 107*  BUN 13 15 13   CREATININE 1.66* 1.36* 0.90  CALCIUM 8.6* 8.8* 8.7*   Liver Function Tests:  Recent Labs  10/22/14 1343 11/03/14 2015 11/06/14 1256  AST 25 25 30   ALT 15* 13* 12*  ALKPHOS 78 85 74  BILITOT 0.7 0.6 1.0  PROT 7.5 7.8 7.9  ALBUMIN 3.4* 3.4* 3.4*   No results for input(s): LIPASE, AMYLASE in the last 8760 hours.  Recent Labs  11/07/14 0910  AMMONIA 25   CBC:  Recent Labs  01/26/14 1632 01/27/14 0855  10/11/14 1129  11/07/14 0510 11/08/14 0030 11/10/14 0831  WBC 5.6 9.8  < > 9.4  < > 8.7 7.7 9.2  NEUTROABS 2.0 7.2  --  7.5  --   --   --   --   HGB 13.6 12.1*  < > 14.1  < > 12.8* 14.6 13.2  HCT 42.7 38.3*  < > 43.5  < > 38.5* 41.9 39.6  MCV 93.4 91.8  < > 90.4  < > 87.7 86.4 87.2  PLT 145* 131*  < > 168  < > 173 179 167  < > = values in this interval not displayed. Cardiac Enzymes:  Recent Labs  01/26/14 2330 01/27/14 0300 01/27/14 0855   TROPONINI <0.03 0.03 0.03   BNP: Invalid input(s): POCBNP CBG:  Recent Labs  10/22/14 1252 11/06/14 1205  GLUCAP 80 121*   Lab Results  Component Value Date   TSH 3.056 11/07/2014    Assessment/Plan  Elevated PSA In past with concerns for prostate cancer. Family wants another PSA checked. Explained that there could be false positive result with him having a foley. Family understands this and wants to proceed. Order PSA.   Urinary retention Continue foley catheter with foley care. Encourage hydration, staff to check on this and help with fluid administration  Protein calorie malnutrition With his dementia and visual impairment, decline anticipated. Will need full assistance with each meal and staff to check on water administration q shift. To work with dietary get dietary consult and add procel 1 scoop bid with lunch and dinner for now. Assistance with feeding and weekly weight  Physical deconditioning Continue to work with PT and OT. Has made progress with PT but cognition and visual impairment with likely limit further progress. Has been working with OT but required moderate to maximum assist. Reviewed care plan with 3 daughters in detail. Plan is for discharge to long term care SNF vs memory care unit.  Dysphagia Aspiration precaution, continue regular diet and thin liquids and to work with SLP  Feet edema Trace, to keep legs elevated at rest for now  Dementia Likely vascular dementia. MMSE and neurology note reviewed. Assistance with ADLs for now. Fall precautions. Pressure ulcer prophylaxis.    Goals of care: short term rehabilitation   Labs/tests ordered: PSA  Family/ staff Communication: reviewed care plan with patient, daughter, social work and nursing supervisor    Oneal Grout, MD  [facility] [phone] (Monday-Friday 8 am - 5 pm) [phone] (afterhours)

## 2014-12-01 LAB — HEPATIC FUNCTION PANEL
ALT: 20 U/L (ref 10–40)
AST: 24 U/L (ref 14–40)
Alkaline Phosphatase: 98 U/L (ref 25–125)
Bilirubin, Direct: 0.22 mg/dL
Bilirubin, Total: 0.7 mg/dL

## 2014-12-01 LAB — LIPID PANEL
CHOLESTEROL: 123 mg/dL (ref 0–200)
HDL: 44 mg/dL (ref 35–70)
LDL Cholesterol: 60 mg/dL
LDL/HDL RATIO: 2.8
Triglycerides: 86 mg/dL (ref 40–160)

## 2014-12-01 LAB — TSH: TSH: 4.77 u[IU]/mL (ref <=5.90)

## 2014-12-04 ENCOUNTER — Non-Acute Institutional Stay (SKILLED_NURSING_FACILITY): Payer: Medicare Other | Admitting: Nurse Practitioner

## 2014-12-04 ENCOUNTER — Inpatient Hospital Stay (HOSPITAL_COMMUNITY)
Admission: EM | Admit: 2014-12-04 | Discharge: 2014-12-11 | DRG: 177 | Disposition: A | Discharge status: Hospice | Payer: Medicare Other | Attending: Internal Medicine | Admitting: Internal Medicine

## 2014-12-04 ENCOUNTER — Encounter (HOSPITAL_COMMUNITY): Payer: Self-pay | Admitting: Emergency Medicine

## 2014-12-04 DIAGNOSIS — N390 Urinary tract infection, site not specified: Secondary | ICD-10-CM | POA: Diagnosis present

## 2014-12-04 DIAGNOSIS — B9689 Other specified bacterial agents as the cause of diseases classified elsewhere: Secondary | ICD-10-CM | POA: Diagnosis present

## 2014-12-04 DIAGNOSIS — H3530 Unspecified macular degeneration: Secondary | ICD-10-CM | POA: Diagnosis present

## 2014-12-04 DIAGNOSIS — F329 Major depressive disorder, single episode, unspecified: Secondary | ICD-10-CM | POA: Diagnosis present

## 2014-12-04 DIAGNOSIS — N401 Enlarged prostate with lower urinary tract symptoms: Secondary | ICD-10-CM

## 2014-12-04 DIAGNOSIS — E890 Postprocedural hypothyroidism: Secondary | ICD-10-CM | POA: Diagnosis not present

## 2014-12-04 DIAGNOSIS — R531 Weakness: Secondary | ICD-10-CM

## 2014-12-04 DIAGNOSIS — N400 Enlarged prostate without lower urinary tract symptoms: Secondary | ICD-10-CM

## 2014-12-04 DIAGNOSIS — G9341 Metabolic encephalopathy: Secondary | ICD-10-CM

## 2014-12-04 DIAGNOSIS — F0150 Vascular dementia without behavioral disturbance: Secondary | ICD-10-CM | POA: Diagnosis not present

## 2014-12-04 DIAGNOSIS — F32A Depression, unspecified: Secondary | ICD-10-CM | POA: Diagnosis present

## 2014-12-04 DIAGNOSIS — I5032 Chronic diastolic (congestive) heart failure: Secondary | ICD-10-CM | POA: Diagnosis present

## 2014-12-04 DIAGNOSIS — Z6820 Body mass index (BMI) 20.0-20.9, adult: Secondary | ICD-10-CM

## 2014-12-04 DIAGNOSIS — E785 Hyperlipidemia, unspecified: Secondary | ICD-10-CM | POA: Diagnosis present

## 2014-12-04 DIAGNOSIS — I252 Old myocardial infarction: Secondary | ICD-10-CM

## 2014-12-04 DIAGNOSIS — Z79899 Other long term (current) drug therapy: Secondary | ICD-10-CM

## 2014-12-04 DIAGNOSIS — R1110 Vomiting, unspecified: Secondary | ICD-10-CM | POA: Diagnosis not present

## 2014-12-04 DIAGNOSIS — N179 Acute kidney failure, unspecified: Secondary | ICD-10-CM | POA: Diagnosis present

## 2014-12-04 DIAGNOSIS — Z515 Encounter for palliative care: Secondary | ICD-10-CM

## 2014-12-04 DIAGNOSIS — D631 Anemia in chronic kidney disease: Secondary | ICD-10-CM | POA: Diagnosis present

## 2014-12-04 DIAGNOSIS — G9340 Encephalopathy, unspecified: Secondary | ICD-10-CM | POA: Diagnosis present

## 2014-12-04 DIAGNOSIS — I130 Hypertensive heart and chronic kidney disease with heart failure and stage 1 through stage 4 chronic kidney disease, or unspecified chronic kidney disease: Secondary | ICD-10-CM | POA: Diagnosis present

## 2014-12-04 DIAGNOSIS — J690 Pneumonitis due to inhalation of food and vomit: Secondary | ICD-10-CM | POA: Diagnosis not present

## 2014-12-04 DIAGNOSIS — I10 Essential (primary) hypertension: Secondary | ICD-10-CM | POA: Diagnosis present

## 2014-12-04 DIAGNOSIS — Z955 Presence of coronary angioplasty implant and graft: Secondary | ICD-10-CM

## 2014-12-04 DIAGNOSIS — N183 Chronic kidney disease, stage 3 unspecified: Secondary | ICD-10-CM | POA: Diagnosis present

## 2014-12-04 DIAGNOSIS — T83511A Infection and inflammatory reaction due to indwelling urethral catheter, initial encounter: Secondary | ICD-10-CM

## 2014-12-04 DIAGNOSIS — E039 Hypothyroidism, unspecified: Secondary | ICD-10-CM | POA: Diagnosis present

## 2014-12-04 DIAGNOSIS — E46 Unspecified protein-calorie malnutrition: Secondary | ICD-10-CM | POA: Diagnosis not present

## 2014-12-04 DIAGNOSIS — F03918 Unspecified dementia, unspecified severity, with other behavioral disturbance: Secondary | ICD-10-CM | POA: Diagnosis present

## 2014-12-04 DIAGNOSIS — Z7902 Long term (current) use of antithrombotics/antiplatelets: Secondary | ICD-10-CM

## 2014-12-04 DIAGNOSIS — E43 Unspecified severe protein-calorie malnutrition: Secondary | ICD-10-CM | POA: Diagnosis present

## 2014-12-04 DIAGNOSIS — I2510 Atherosclerotic heart disease of native coronary artery without angina pectoris: Secondary | ICD-10-CM | POA: Diagnosis present

## 2014-12-04 DIAGNOSIS — Z7982 Long term (current) use of aspirin: Secondary | ICD-10-CM

## 2014-12-04 DIAGNOSIS — R338 Other retention of urine: Secondary | ICD-10-CM

## 2014-12-04 DIAGNOSIS — F0391 Unspecified dementia with behavioral disturbance: Secondary | ICD-10-CM | POA: Diagnosis present

## 2014-12-04 DIAGNOSIS — F0390 Unspecified dementia without behavioral disturbance: Secondary | ICD-10-CM | POA: Diagnosis present

## 2014-12-04 DIAGNOSIS — Z87891 Personal history of nicotine dependence: Secondary | ICD-10-CM

## 2014-12-04 DIAGNOSIS — J189 Pneumonia, unspecified organism: Secondary | ICD-10-CM | POA: Diagnosis present

## 2014-12-04 DIAGNOSIS — I739 Peripheral vascular disease, unspecified: Secondary | ICD-10-CM | POA: Diagnosis present

## 2014-12-04 DIAGNOSIS — Z1635 Resistance to multiple antimicrobial drugs: Secondary | ICD-10-CM | POA: Diagnosis present

## 2014-12-04 NOTE — Progress Notes (Signed)
Nursing Home Location:  [facility]   Place of Service: SNF (31)  PCP: [name], MD  Allergies  Allergen Reactions  . Tramadol Nausea And Vomiting    Chief Complaint  Patient presents with  . Discharge Note    HPI:  Patient is a 81 y.o. male seen today at [facility] for discharge home with family. He has PMH of urinary retention with foley, HTN, CAD, low back pain and spinal stenosis. Pt At [facility] for short term rehabilitation after hospitalization from 11/06/14-11/11/14 with acute encephalopathy along with dehydration and UTI. He was started on iv antibiotics and then transitioned to po augmentin. He had EEG showing metabolic encephalopathy. He was seen by SLP team with concerns for aspiration and started on regular diet with thin liquids which he has been tolerating well.  He is seen in his room today with grandson who has been in to visit. Family without concerns per grandson. Pt is legally blind.  pleasant but confused.   Review of Systems:  Review of Systems  Constitutional: Negative for activity change, appetite change, fatigue and unexpected weight change.  HENT: Negative for congestion and hearing loss.   Eyes:       Blind  Respiratory: Negative for cough and shortness of breath.   Cardiovascular: Negative for chest pain, palpitations and leg swelling.  Gastrointestinal: Negative for abdominal pain, diarrhea and constipation.  Genitourinary: Negative for dysuria and difficulty urinating.  Musculoskeletal: Negative for myalgias and arthralgias.  Skin: Negative for color change and wound.  Neurological: Positive for weakness (generalized). Negative for dizziness.  Psychiatric/Behavioral: Positive for confusion. Negative for behavioral problems and agitation.    Past Medical History  Diagnosis Date  . Bleeding ulcer   . Spinal stenosis   . Thyroid disease   . Hypertension   . Coronary artery disease     non-ST  segment elevation myocardial infarction February of 2004 she was stenting of the circumflex using a Cypher drug-eluting stent.  . Hyperlipidemia   . Claudication (HCC)   . Macular degeneration   . Altered mental status    Past Surgical History  Procedure Laterality Date  . Coronary angioplasty with stent placement    . Thyroidectomy, partial    . Renal doppler  08/17/2005    Normal evaluation  . Cardiac catheterization  02/25/2002    Proximal L Circumflex 95% lesion, stented w/ 3x18-mm CYPHER stent avoiding the 2 L Marginal branch, jailing the 1st marginal, resulting in reduction of a 90-95% lesion to 0% residual  . Cardiovascular stress test  12/18/2006    EKG negative for ischemia, no significant ischemia demonstrated.  . Transthoracic echocardiogram  03/13/2002    EF >55%, moderate LVH,   . Back surgery     Social History:   reports that he has quit smoking. He has never used smokeless tobacco. He reports that he does not drink alcohol or use illicit drugs.  Family History  Problem Relation Age of Onset  . Family history unknown: Yes    Medications: Patient's Medications  New Prescriptions   No medications on file  Previous Medications   ASPIRIN EC 81 MG TABLET    Take 81 mg by mouth daily.   CLOPIDOGREL (PLAVIX) 75 MG TABLET    Take 1 tablet (75 mg total) by mouth daily.   DORZOLAMIDE (TRUSOPT) 2 % OPHTHALMIC SOLUTION    Place 1 drop into the right eye 2 (two) times daily.  FINASTERIDE (PROSCAR) 5 MG TABLET    Take 1 tablet (5 mg total) by mouth daily.   GUAIFENESIN-DEXTROMETHORPHAN (ROBITUSSIN DM) 100-10 MG/5ML SYRUP    Take 5 mLs by mouth every 4 (four) hours as needed for cough.   ISOSORBIDE MONONITRATE (IMDUR) 30 MG 24 HR TABLET    Take 1 tablet (30 mg total) by mouth daily.   LATANOPROST (XALATAN) 0.005 % OPHTHALMIC SOLUTION    Place 1 drop into both eyes at bedtime.   LEVOTHYROXINE (SYNTHROID, LEVOTHROID) 75 MCG TABLET    Take 75 mcg by mouth daily.   LOSARTAN  (COZAAR) 50 MG TABLET    Take 50 mg by mouth daily.    METOPROLOL SUCCINATE (TOPROL-XL) 50 MG 24 HR TABLET    Take 25 mg by mouth daily. Take with or immediately following a meal.   PROTEIN (PROCEL) POWD    Take 1 scoop by mouth 2 (two) times daily.   QUETIAPINE (SEROQUEL) 25 MG TABLET    Take 25 mg by mouth at bedtime. For behaviors   RANITIDINE (ZANTAC) 75 MG TABLET    Take 75 mg by mouth daily.   SIMVASTATIN (ZOCOR) 40 MG TABLET    Take 40 mg by mouth daily at 6 PM.    TAMSULOSIN (FLOMAX) 0.4 MG CAPS CAPSULE    Take 0.4 mg by mouth daily.   TIMOLOL (TIMOPTIC) 0.5 % OPHTHALMIC SOLUTION    Place 1 drop into both eyes 2 (two) times daily.   Modified Medications   No medications on file  Discontinued Medications   No medications on file     Physical Exam: Filed Vitals:   12/04/14 1058  BP: 104/60  Pulse: 111  Temp: 97.1 F (36.2 C)  Resp: 20  Height: 5\' 11"  (1.803 m)  Weight: 149 lb 14.4 oz (67.994 kg)  SpO2: 94%    Physical Exam  Constitutional: He appears well-developed and well-nourished. No distress.  HENT:  Head: Normocephalic and atraumatic.  Mouth/Throat: Oropharynx is clear and moist. No oropharyngeal exudate.  Eyes: Pupils are equal, round, and reactive to light.  Neck: Normal range of motion. Neck supple.  Cardiovascular: Normal rate, regular rhythm and normal heart sounds.   Pulmonary/Chest: Effort normal and breath sounds normal.  Abdominal: Soft. Bowel sounds are normal.  Genitourinary:  Foley catheter, clear yellow urine  Musculoskeletal: He exhibits no edema or tenderness.  Uses WC  Neurological: He is alert.  Skin: Skin is warm and dry. He is not diaphoretic.  Psychiatric: He has a normal mood and affect.    Labs reviewed: Basic Metabolic Panel:  Recent Labs  16/10/9609/05/16 0510 11/08/14 0030 11/10/14 0831  NA 138 140 136  K 3.6 3.3* 3.4*  CL 106 109 104  CO2 20* 20* 18*  GLUCOSE 108* 97 107*  BUN 13 15 13   CREATININE 1.66* 1.36* 0.90  CALCIUM  8.6* 8.8* 8.7*   Liver Function Tests:  Recent Labs  10/22/14 1343 11/03/14 2015 11/06/14 1256  AST 25 25 30   ALT 15* 13* 12*  ALKPHOS 78 85 74  BILITOT 0.7 0.6 1.0  PROT 7.5 7.8 7.9  ALBUMIN 3.4* 3.4* 3.4*   No results for input(s): LIPASE, AMYLASE in the last 8760 hours.  Recent Labs  11/07/14 0910  AMMONIA 25   CBC:  Recent Labs  01/26/14 1632 01/27/14 0855  10/11/14 1129  11/07/14 0510 11/08/14 0030 11/10/14 0831  WBC 5.6 9.8  < > 9.4  < > 8.7 7.7 9.2  NEUTROABS 2.0 7.2  --  7.5  --   --   --   --   HGB 13.6 12.1*  < > 14.1  < > 12.8* 14.6 13.2  HCT 42.7 38.3*  < > 43.5  < > 38.5* 41.9 39.6  MCV 93.4 91.8  < > 90.4  < > 87.7 86.4 87.2  PLT 145* 131*  < > 168  < > 173 179 167  < > = values in this interval not displayed. TSH:  Recent Labs  01/26/14 2330 11/07/14 0510  TSH 0.314* 3.056   A1C: Lab Results  Component Value Date   HGBA1C 6.4* 10/13/2014   Lipid Panel: No results for input(s): CHOL, HDL, LDLCALC, TRIG, CHOLHDL, LDLDIRECT in the last 8760 hours.   Assessment/Plan 1. UTI (lower urinary tract infection)  completed course of Augmentin, no signs of recurrence.    2. Protein-calorie malnutrition (HCC) -will need further follow up, needs assistance with meals and intake due to visual impairment and dementia, cont with supplements.   3. BPH (benign prostatic hypertrophy) with urinary retention conts on with foley catheter and proper catheter care, to follow up with urology as outpatient. conts on proscar and flomax  4. Dementia, vascular, without behavioral disturbance Stable, needing assistance at home with care.   5. Encephalopathy, metabolic Has improvement, mental status at baseline.   6. Essential hypertension Blood pressure stable, conts on current regimen  7. Postoperative hypothyroidism Recent TSH of 3.05 WNL, conts synthroid 75 mcg  8. Generalized weakness Due to Recent UTI and metabolic encephalopathy.  pt is stable for  discharge home with 24 hour care/family assistance, will need PT/OT/HHA per home health. DME needed includes hospital bed and FWW . Rx written.  will need to follow up with PCP within 2 weeks.     Janene Harvey. Biagio Borg  Special[facility] [phone] 8 am - 5 pm) [phone] (after hours)

## 2014-12-04 NOTE — ED Provider Notes (Signed)
CSN: [id number]     Arrival date & time 12/04/14  2152 History  By signing my name below, I, Jeremy [name], attest that this documentation has been prepared under the direction and in the presence of [name], MD. Electronically Signed: Gonzella [name], Scribe. 12/05/2014. 12:03 AM.    Chief Complaint  Patient presents with  . Cough  . Fatigue    The history is provided by the patient. No language interpreter was used.    HPI Comments: Jeremy [name] is a 81 y.o. male who presents to the Emergency Department complaining of onset of productive cough and fatigue earlier this evening. Pt reports he now feels fine and states that he is able to remember vomiting. Per pt's daughter, the pt was at [facility] with a UTI but was then admitted to [facility] place for rehabilitation where he has been for the past month. Pt's UTI led to severe dementia and delusion and resulted in the pt needing assistance with ambulation. Pt has been improving over the past couple of weeks and is now able to ambulate with assistance of a walker. He was scheduled to be released home tomorrow for 24 hour care but the pt's daughter reports that there was blood found around the pt's catheter today where after he began to experience shaking and twitching as well as vomiting. It was suspected that the pt was having a seizure so the pt was given oxygen and 911 was then called. Pt seemed to be fine by the time the ambulance came but ever since his episode of emesis the pt has been coughing up sputum. He denies abdominal pain, SOB, and pain in general. Pt has had a catheter for 9 weeks now because of urine retention.    Past Medical History  Diagnosis Date  . Bleeding ulcer   . Spinal stenosis   . Thyroid disease   . Hypertension   . Coronary artery disease     non-ST segment elevation myocardial infarction February of 2004 she was stenting of the circumflex using a Cypher drug-eluting stent.  . Hyperlipidemia   .  Claudication (HCC)   . Macular degeneration   . Altered mental status    Past Surgical History  Procedure Laterality Date  . Coronary angioplasty with stent placement    . Thyroidectomy, partial    . Renal doppler  08/17/2005    Normal evaluation  . Cardiac catheterization  02/25/2002    Proximal L Circumflex 95% lesion, stented w/ 3x18-mm CYPHER stent avoiding the 2 L Marginal branch, jailing the 1st marginal, resulting in reduction of a 90-95% lesion to 0% residual  . Cardiovascular stress test  12/18/2006    EKG negative for ischemia, no significant ischemia demonstrated.  . Transthoracic echocardiogram  03/13/2002    EF >55%, moderate LVH,   . Back surgery     Family History  Problem Relation Age of Onset  . Family history unknown: Yes   Social History  Substance Use Topics  . Smoking status: Former Games developer  . Smokeless tobacco: Never Used     Comment: Quit 50+ years ago.  . Alcohol Use: No    Review of Systems A complete 10 system review of systems was obtained and all systems are negative except as noted in the HPI and PMH.    Allergies  Tramadol  Home Medications   Prior to Admission medications   Medication Sig Start Date End Date Taking? Authorizing Provider  aspirin EC 81 MG tablet  Take 81 mg by mouth daily.    Historical Provider, MD  clopidogrel (PLAVIX) 75 MG tablet Take 1 tablet (75 mg total) by mouth daily. 10/30/14   Runell Gess, MD  dorzolamide (TRUSOPT) 2 % ophthalmic solution Place 1 drop into the right eye 2 (two) times daily.  08/23/12   Historical Provider, MD  finasteride (PROSCAR) 5 MG tablet Take 1 tablet (5 mg total) by mouth daily. Patient taking differently: Take 5 mg by mouth every evening.  10/13/14   Albertine Grates, MD  guaiFENesin-dextromethorphan (ROBITUSSIN DM) 100-10 MG/5ML syrup Take 5 mLs by mouth every 4 (four) hours as needed for cough. 11/11/14   Richarda Overlie, MD  isosorbide mononitrate (IMDUR) 30 MG 24 hr tablet Take 1 tablet (30 mg  total) by mouth daily. 10/30/14   Runell Gess, MD  latanoprost (XALATAN) 0.005 % ophthalmic solution Place 1 drop into both eyes at bedtime.    Historical Provider, MD  levothyroxine (SYNTHROID, LEVOTHROID) 75 MCG tablet Take 75 mcg by mouth daily. 11/06/14   Historical Provider, MD  losartan (COZAAR) 50 MG tablet Take 50 mg by mouth daily.  10/08/12   Historical Provider, MD  metoprolol succinate (TOPROL-XL) 50 MG 24 hr tablet Take 25 mg by mouth daily. Take with or immediately following a meal.    Historical Provider, MD  Protein (PROCEL) POWD Take 1 scoop by mouth 2 (two) times daily.    Historical Provider, MD  QUEtiapine (SEROQUEL) 25 MG tablet Take 25 mg by mouth at bedtime. For behaviors    Historical Provider, MD  ranitidine (ZANTAC) 75 MG tablet Take 75 mg by mouth daily.    Historical Provider, MD  simvastatin (ZOCOR) 40 MG tablet Take 40 mg by mouth daily at 6 PM.     Historical Provider, MD  tamsulosin (FLOMAX) 0.4 MG CAPS capsule Take 0.4 mg by mouth daily. 10/13/14   Historical Provider, MD  timolol (TIMOPTIC) 0.5 % ophthalmic solution Place 1 drop into both eyes 2 (two) times daily.  09/18/12   Historical Provider, MD   BP 117/76 mmHg  Pulse 93  Temp(Src) 97.1 F (36.2 C)  Resp 16  Ht 5\' 11"  (1.803 m)  Wt 150 lb (68.04 kg)  BMI 20.93 kg/m2  SpO2 97% Physical Exam  Constitutional: He is oriented to person, place, and time. He appears well-developed and well-nourished. No distress.  HENT:  Head: Normocephalic and atraumatic.  Mouth/Throat: Oropharynx is clear and moist. No oropharyngeal exudate.  Eyes: Conjunctivae and EOM are normal. Pupils are equal, round, and reactive to light.  Neck: Normal range of motion. Neck supple.  No meningismus.  Cardiovascular: Normal rate, regular rhythm, normal heart sounds and intact distal pulses.   No murmur heard. Pulmonary/Chest: Effort normal and breath sounds normal. No respiratory distress.  Abdominal: Soft. There is no  tenderness. There is no rebound and no guarding.  Genitourinary:  Indwelling Foley catheter.  Small amount of blood clot noted around the meatus.  Urine is cloudy  Musculoskeletal: Normal range of motion. He exhibits no edema or tenderness.  Neurological: He is alert and oriented to person, place, and time. No cranial nerve deficit. He exhibits normal muscle tone. Coordination normal.  No ataxia on finger to nose bilaterally. No pronator drift. 5/5 strength throughout. CN 2-12 intact.Equal grip strength. Sensation intact.   Skin: Skin is warm.  Psychiatric: He has a normal mood and affect. His behavior is normal.  Nursing note and vitals reviewed.   ED Course  Procedures  DIAGNOSTIC STUDIES:    Oxygen Saturation is 97% on RA, adequate by my interpretation.   COORDINATION OF CARE:  11:41 PM Will order for pt to have a new catheter put in. Will order blood work and chest x-ray. Discussed treatment plan with pt at bedside and pt agreed to plan.     Labs Review Labs Reviewed  CBC WITH DIFFERENTIAL/PLATELET - Abnormal; Notable for the following:    RBC 3.84 (*)    Hemoglobin 11.0 (*)    HCT 34.1 (*)    All other components within normal limits  COMPREHENSIVE METABOLIC PANEL - Abnormal; Notable for the following:    CO2 20 (*)    Glucose, Bld 111 (*)    BUN 31 (*)    Creatinine, Ser 1.45 (*)    Calcium 8.5 (*)    Albumin 3.0 (*)    GFR calc non Af Amer 42 (*)    GFR calc Af Amer 48 (*)    All other components within normal limits  URINALYSIS, ROUTINE W REFLEX MICROSCOPIC (NOT AT [facility]) - Abnormal; Notable for the following:    Color, Urine AMBER (*)    APPearance CLOUDY (*)    Hgb urine dipstick SMALL (*)    Bilirubin Urine SMALL (*)    Ketones, ur 15 (*)    Protein, ur 30 (*)    Nitrite POSITIVE (*)    Leukocytes, UA LARGE (*)    All other components within normal limits  URINE MICROSCOPIC-ADD ON - Abnormal; Notable for the following:    Squamous Epithelial / LPF 0-5 (*)     Bacteria, UA MANY (*)    Casts HYALINE CASTS (*)    All other components within normal limits  URINALYSIS, ROUTINE W REFLEX MICROSCOPIC (NOT AT [facility]) - Abnormal; Notable for the following:    Color, Urine AMBER (*)    APPearance TURBID (*)    Hgb urine dipstick LARGE (*)    Ketones, ur 15 (*)    Protein, ur 100 (*)    Nitrite POSITIVE (*)    Leukocytes, UA LARGE (*)    All other components within normal limits  URINE MICROSCOPIC-ADD ON - Abnormal; Notable for the following:    Squamous Epithelial / LPF 6-30 (*)    Bacteria, UA MANY (*)    All other components within normal limits  URINE CULTURE  URINE CULTURE    Imaging Review Dg Chest 2 View  12/05/2014  CLINICAL DATA:  81 year old male with productive cough EXAM: CHEST  2 VIEW COMPARISON:  Radiograph dated 11/10/2014 FINDINGS: Two views of the chest demonstrate focal area of mild increased vascular and interstitial prominence in the right lower lung field and right infrahilar region this may represent focal area of subsegmental atelectasis. Developing pneumonia is not excluded. Clinical correlation and follow-up recommended. There is no focal consolidation, pleural effusion or pneumothorax. The aorta is tortuous. Stable cardiac silhouette. The osseous structures are grossly unremarkable. IMPRESSION: Focal right lower lung field subsegmental atelectasis versus developing pneumonia. Clinical correlation is recommended. Electronically Signed   By: Elgie CollardArash  Radparvar M.D.   On: 12/05/2014 00:55   I have personally reviewed and evaluated these images and lab results as part of my medical decision-making.   EKG Interpretation None      MDM   Final diagnoses:  HCAP (healthcare-associated pneumonia)  Urinary tract infection associated with catheterization of urinary tract, initial encounter    2:04 AM Patient with vomiting tonight.  He does appear to  probably have a urinary tract infection given the consistency and sediment noted  in the urine.  Initial urinalysis and urine culture was obtained off the old catheter.  The second sample obtained as well as the second urine culture was obtained from a fresh Foley catheter was placed.  Patient has a long-standing history of urinary retention and a chronic indwelling catheter.  Will vomiting tonight.  He does have a new cough as well and has a right lower lobe infiltrate.  This could represent healthcare associated pneumonia versus aspiration pneumonia.  Patient be started on vancomycin and Zosyn for healthcare associated pneumonia which will also cover aspiration.  The Zosyn will also cover urinary pathogens.  Patient be admitted to the hospital for additional management of his symptoms.  I personally performed the services described in this documentation, which was scribed in my presence. The recorded information has been reviewed and is accurate.       [name], MD 12/05/14 [phone]

## 2014-12-04 NOTE — ED Notes (Signed)
PeggB2LamoONGErick BETurner [address] [place] [address]Tilda [address]

## 2014-12-05 ENCOUNTER — Encounter (HOSPITAL_COMMUNITY): Payer: Self-pay

## 2014-12-05 ENCOUNTER — Emergency Department (HOSPITAL_COMMUNITY): Payer: Medicare Other

## 2014-12-05 DIAGNOSIS — N179 Acute kidney failure, unspecified: Secondary | ICD-10-CM

## 2014-12-05 DIAGNOSIS — I10 Essential (primary) hypertension: Secondary | ICD-10-CM | POA: Diagnosis not present

## 2014-12-05 DIAGNOSIS — Z87891 Personal history of nicotine dependence: Secondary | ICD-10-CM | POA: Diagnosis not present

## 2014-12-05 DIAGNOSIS — F0390 Unspecified dementia without behavioral disturbance: Secondary | ICD-10-CM

## 2014-12-05 DIAGNOSIS — Z6820 Body mass index (BMI) 20.0-20.9, adult: Secondary | ICD-10-CM | POA: Diagnosis not present

## 2014-12-05 DIAGNOSIS — I5032 Chronic diastolic (congestive) heart failure: Secondary | ICD-10-CM | POA: Diagnosis present

## 2014-12-05 DIAGNOSIS — D649 Anemia, unspecified: Secondary | ICD-10-CM

## 2014-12-05 DIAGNOSIS — E785 Hyperlipidemia, unspecified: Secondary | ICD-10-CM | POA: Diagnosis not present

## 2014-12-05 DIAGNOSIS — I739 Peripheral vascular disease, unspecified: Secondary | ICD-10-CM | POA: Diagnosis present

## 2014-12-05 DIAGNOSIS — B9689 Other specified bacterial agents as the cause of diseases classified elsewhere: Secondary | ICD-10-CM | POA: Diagnosis present

## 2014-12-05 DIAGNOSIS — N390 Urinary tract infection, site not specified: Secondary | ICD-10-CM

## 2014-12-05 DIAGNOSIS — N183 Chronic kidney disease, stage 3 (moderate): Secondary | ICD-10-CM | POA: Diagnosis present

## 2014-12-05 DIAGNOSIS — G9340 Encephalopathy, unspecified: Secondary | ICD-10-CM | POA: Diagnosis not present

## 2014-12-05 DIAGNOSIS — E039 Hypothyroidism, unspecified: Secondary | ICD-10-CM | POA: Diagnosis present

## 2014-12-05 DIAGNOSIS — Z7902 Long term (current) use of antithrombotics/antiplatelets: Secondary | ICD-10-CM | POA: Diagnosis not present

## 2014-12-05 DIAGNOSIS — Z955 Presence of coronary angioplasty implant and graft: Secondary | ICD-10-CM | POA: Diagnosis not present

## 2014-12-05 DIAGNOSIS — F329 Major depressive disorder, single episode, unspecified: Secondary | ICD-10-CM | POA: Diagnosis present

## 2014-12-05 DIAGNOSIS — Z515 Encounter for palliative care: Secondary | ICD-10-CM | POA: Diagnosis present

## 2014-12-05 DIAGNOSIS — N400 Enlarged prostate without lower urinary tract symptoms: Secondary | ICD-10-CM | POA: Diagnosis present

## 2014-12-05 DIAGNOSIS — J189 Pneumonia, unspecified organism: Secondary | ICD-10-CM | POA: Diagnosis not present

## 2014-12-05 DIAGNOSIS — E43 Unspecified severe protein-calorie malnutrition: Secondary | ICD-10-CM | POA: Diagnosis present

## 2014-12-05 DIAGNOSIS — J690 Pneumonitis due to inhalation of food and vomit: Principal | ICD-10-CM

## 2014-12-05 DIAGNOSIS — I2510 Atherosclerotic heart disease of native coronary artery without angina pectoris: Secondary | ICD-10-CM | POA: Diagnosis present

## 2014-12-05 DIAGNOSIS — R1110 Vomiting, unspecified: Secondary | ICD-10-CM | POA: Diagnosis present

## 2014-12-05 DIAGNOSIS — D631 Anemia in chronic kidney disease: Secondary | ICD-10-CM | POA: Diagnosis present

## 2014-12-05 DIAGNOSIS — I252 Old myocardial infarction: Secondary | ICD-10-CM | POA: Diagnosis not present

## 2014-12-05 DIAGNOSIS — Z79899 Other long term (current) drug therapy: Secondary | ICD-10-CM | POA: Diagnosis not present

## 2014-12-05 DIAGNOSIS — Z1635 Resistance to multiple antimicrobial drugs: Secondary | ICD-10-CM | POA: Diagnosis present

## 2014-12-05 DIAGNOSIS — H3530 Unspecified macular degeneration: Secondary | ICD-10-CM | POA: Diagnosis present

## 2014-12-05 DIAGNOSIS — Z7982 Long term (current) use of aspirin: Secondary | ICD-10-CM | POA: Diagnosis not present

## 2014-12-05 DIAGNOSIS — I130 Hypertensive heart and chronic kidney disease with heart failure and stage 1 through stage 4 chronic kidney disease, or unspecified chronic kidney disease: Secondary | ICD-10-CM | POA: Diagnosis present

## 2014-12-05 LAB — CBC
HCT: 33.9 % — ABNORMAL LOW (ref 39.0–52.0)
Hemoglobin: 11.1 g/dL — ABNORMAL LOW (ref 13.0–17.0)
MCH: 28.9 pg (ref 26.0–34.0)
MCHC: 32.7 g/dL (ref 30.0–36.0)
MCV: 88.3 fL (ref 78.0–100.0)
PLATELETS: 211 10*3/uL (ref 150–400)
RBC: 3.84 MIL/uL — ABNORMAL LOW (ref 4.22–5.81)
RDW: 15.3 % (ref 11.5–15.5)
WBC: 10.3 10*3/uL (ref 4.0–10.5)

## 2014-12-05 LAB — URINE MICROSCOPIC-ADD ON

## 2014-12-05 LAB — COMPREHENSIVE METABOLIC PANEL
ALBUMIN: 3 g/dL — AB (ref 3.5–5.0)
ALK PHOS: 71 U/L (ref 38–126)
ALT: 20 U/L (ref 17–63)
AST: 29 U/L (ref 15–41)
Anion gap: 10 (ref 5–15)
BUN: 31 mg/dL — ABNORMAL HIGH (ref 6–20)
CALCIUM: 8.5 mg/dL — AB (ref 8.9–10.3)
CHLORIDE: 109 mmol/L (ref 101–111)
CO2: 20 mmol/L — AB (ref 22–32)
CREATININE: 1.45 mg/dL — AB (ref 0.61–1.24)
GFR calc non Af Amer: 42 mL/min — ABNORMAL LOW (ref >60)
GFR, EST AFRICAN AMERICAN: 48 mL/min — AB (ref >60)
GLUCOSE: 111 mg/dL — AB (ref 65–99)
Potassium: 4.3 mmol/L (ref 3.5–5.1)
SODIUM: 139 mmol/L (ref 135–145)
Total Bilirubin: 1 mg/dL (ref 0.3–1.2)
Total Protein: 6.9 g/dL (ref 6.5–8.1)

## 2014-12-05 LAB — BASIC METABOLIC PANEL
Anion gap: 7 (ref 5–15)
BUN: 30 mg/dL — AB (ref 6–20)
CHLORIDE: 110 mmol/L (ref 101–111)
CO2: 22 mmol/L (ref 22–32)
CREATININE: 1.46 mg/dL — AB (ref 0.61–1.24)
Calcium: 8.6 mg/dL — ABNORMAL LOW (ref 8.9–10.3)
GFR calc Af Amer: 48 mL/min — ABNORMAL LOW (ref >60)
GFR calc non Af Amer: 41 mL/min — ABNORMAL LOW (ref >60)
GLUCOSE: 110 mg/dL — AB (ref 65–99)
Potassium: 3.6 mmol/L (ref 3.5–5.1)
SODIUM: 139 mmol/L (ref 135–145)

## 2014-12-05 LAB — URINALYSIS, ROUTINE W REFLEX MICROSCOPIC
Bilirubin Urine: NEGATIVE
GLUCOSE, UA: NEGATIVE mg/dL
GLUCOSE, UA: NEGATIVE mg/dL
KETONES UR: 15 mg/dL — AB
KETONES UR: 15 mg/dL — AB
NITRITE: POSITIVE — AB
Nitrite: POSITIVE — AB
PH: 5.5 (ref 5.0–8.0)
PROTEIN: 100 mg/dL — AB
Protein, ur: 30 mg/dL — AB
SPECIFIC GRAVITY, URINE: 1.022 (ref 1.005–1.030)
Specific Gravity, Urine: 1.024 (ref 1.005–1.030)
pH: 5.5 (ref 5.0–8.0)

## 2014-12-05 LAB — CBC WITH DIFFERENTIAL/PLATELET
BASOS ABS: 0 10*3/uL (ref 0.0–0.1)
BASOS PCT: 0 %
EOS ABS: 0.1 10*3/uL (ref 0.0–0.7)
EOS PCT: 1 %
HCT: 34.1 % — ABNORMAL LOW (ref 39.0–52.0)
HEMOGLOBIN: 11 g/dL — AB (ref 13.0–17.0)
LYMPHS ABS: 1.4 10*3/uL (ref 0.7–4.0)
Lymphocytes Relative: 14 %
MCH: 28.6 pg (ref 26.0–34.0)
MCHC: 32.3 g/dL (ref 30.0–36.0)
MCV: 88.8 fL (ref 78.0–100.0)
Monocytes Absolute: 0.8 10*3/uL (ref 0.1–1.0)
Monocytes Relative: 8 %
NEUTROS PCT: 77 %
Neutro Abs: 7.6 10*3/uL (ref 1.7–7.7)
PLATELETS: 166 10*3/uL (ref 150–400)
RBC: 3.84 MIL/uL — AB (ref 4.22–5.81)
RDW: 15.1 % (ref 11.5–15.5)
WBC: 9.9 10*3/uL (ref 4.0–10.5)

## 2014-12-05 MED ORDER — ALUM & MAG HYDROXIDE-SIMETH 200-200-20 MG/5ML PO SUSP: 30.0000 mL | Freq: Four times a day (QID) | ORAL | Status: DC | PRN

## 2014-12-05 MED ORDER — BENEPROTEIN PO POWD
1.0000 | Freq: Two times a day (BID) | ORAL | Status: DC
Start: 2014-12-05 — End: 2014-12-11
  Administered 2014-12-05 – 2014-12-11 (×13): 6 g via ORAL
  Filled 2014-12-05 (×3): qty 227

## 2014-12-05 MED ORDER — QUETIAPINE FUMARATE 25 MG PO TABS
25.0000 mg | ORAL_TABLET | Freq: Every day | ORAL | Status: DC
  Administered 2014-12-05 – 2014-12-10 (×6): 25 mg via ORAL
  Filled 2014-12-05 (×6): qty 1

## 2014-12-05 MED ORDER — TIMOLOL MALEATE 0.5 % OP SOLN
1.0000 [drp] | Freq: Two times a day (BID) | OPHTHALMIC | Status: DC
  Administered 2014-12-05 – 2014-12-11 (×13): 1 [drp] via OPHTHALMIC
  Filled 2014-12-05 (×3): qty 5

## 2014-12-05 MED ORDER — FINASTERIDE 5 MG PO TABS
5.0000 mg | ORAL_TABLET | Freq: Every evening | ORAL | Status: DC
  Administered 2014-12-05 – 2014-12-10 (×6): 5 mg via ORAL
  Filled 2014-12-05 (×6): qty 1

## 2014-12-05 MED ORDER — CLOPIDOGREL BISULFATE 75 MG PO TABS
75.0000 mg | ORAL_TABLET | Freq: Every day | ORAL | Status: DC
  Administered 2014-12-05 – 2014-12-11 (×7): 75 mg via ORAL
  Filled 2014-12-05 (×7): qty 1

## 2014-12-05 MED ORDER — ACETAMINOPHEN 650 MG RE SUPP: 650.0000 mg | Freq: Four times a day (QID) | RECTAL | Status: DC | PRN

## 2014-12-05 MED ORDER — OXYCODONE HCL 5 MG PO TABS: 5.0000 mg | ORAL_TABLET | ORAL | Status: DC | PRN

## 2014-12-05 MED ORDER — PIPERACILLIN-TAZOBACTAM 3.375 G IVPB
3.3750 g | Freq: Three times a day (TID) | INTRAVENOUS | Status: DC
  Administered 2014-12-05 – 2014-12-08 (×10): 3.375 g via INTRAVENOUS
  Filled 2014-12-05 (×11): qty 50

## 2014-12-05 MED ORDER — ENOXAPARIN SODIUM 30 MG/0.3ML ~~LOC~~ SOLN: 30.0000 mg | SUBCUTANEOUS | Status: DC

## 2014-12-05 MED ORDER — ISOSORBIDE MONONITRATE ER 30 MG PO TB24
30.0000 mg | ORAL_TABLET | Freq: Every day | ORAL | Status: DC
  Administered 2014-12-05 – 2014-12-11 (×7): 30 mg via ORAL
  Filled 2014-12-05 (×7): qty 1

## 2014-12-05 MED ORDER — PIPERACILLIN-TAZOBACTAM 3.375 G IVPB 30 MIN: 3.3750 g | Freq: Three times a day (TID) | INTRAVENOUS | Status: DC

## 2014-12-05 MED ORDER — SIMVASTATIN 40 MG PO TABS
40.0000 mg | ORAL_TABLET | Freq: Every day | ORAL | Status: DC
  Administered 2014-12-05 – 2014-12-10 (×6): 40 mg via ORAL
  Filled 2014-12-05 (×6): qty 1

## 2014-12-05 MED ORDER — ENSURE ENLIVE PO LIQD
237.0000 mL | Freq: Two times a day (BID) | ORAL | Status: DC
  Administered 2014-12-05 – 2014-12-11 (×9): 237 mL via ORAL

## 2014-12-05 MED ORDER — ACETAMINOPHEN 325 MG PO TABS
650.0000 mg | ORAL_TABLET | Freq: Four times a day (QID) | ORAL | Status: DC | PRN
  Administered 2014-12-06: 650 mg via ORAL
  Filled 2014-12-05: qty 2

## 2014-12-05 MED ORDER — FAMOTIDINE 20 MG PO TABS
10.0000 mg | ORAL_TABLET | Freq: Two times a day (BID) | ORAL | Status: DC
  Administered 2014-12-05 – 2014-12-11 (×13): 10 mg via ORAL
  Filled 2014-12-05 (×14): qty 1

## 2014-12-05 MED ORDER — VANCOMYCIN HCL IN DEXTROSE 1-5 GM/200ML-% IV SOLN
1000.0000 mg | INTRAVENOUS | Status: AC
  Administered 2014-12-05: 1000 mg via INTRAVENOUS
  Filled 2014-12-05: qty 200

## 2014-12-05 MED ORDER — ONDANSETRON HCL 4 MG/2ML IJ SOLN: 4.0000 mg | Freq: Four times a day (QID) | INTRAMUSCULAR | Status: DC | PRN

## 2014-12-05 MED ORDER — HYDROMORPHONE HCL 1 MG/ML IJ SOLN: 0.5000 mg | INTRAMUSCULAR | Status: DC | PRN

## 2014-12-05 MED ORDER — DORZOLAMIDE HCL 2 % OP SOLN
1.0000 [drp] | Freq: Two times a day (BID) | OPHTHALMIC | Status: DC
  Administered 2014-12-05 – 2014-12-11 (×13): 1 [drp] via OPHTHALMIC
  Filled 2014-12-05 (×3): qty 10

## 2014-12-05 MED ORDER — ENOXAPARIN SODIUM 40 MG/0.4ML ~~LOC~~ SOLN
40.0000 mg | SUBCUTANEOUS | Status: DC
  Administered 2014-12-05 – 2014-12-10 (×6): 40 mg via SUBCUTANEOUS
  Filled 2014-12-05 (×6): qty 0.4

## 2014-12-05 MED ORDER — TAMSULOSIN HCL 0.4 MG PO CAPS
0.4000 mg | ORAL_CAPSULE | Freq: Every day | ORAL | Status: DC
Start: 2014-12-05 — End: 2014-12-11
  Administered 2014-12-05 – 2014-12-11 (×7): 0.4 mg via ORAL
  Filled 2014-12-05 (×7): qty 1

## 2014-12-05 MED ORDER — ONDANSETRON HCL 4 MG PO TABS: 4.0000 mg | ORAL_TABLET | Freq: Four times a day (QID) | ORAL | Status: DC | PRN

## 2014-12-05 MED ORDER — VANCOMYCIN HCL IN DEXTROSE 1-5 GM/200ML-% IV SOLN
1000.0000 mg | INTRAVENOUS | Status: DC
  Administered 2014-12-05 – 2014-12-07 (×3): 1000 mg via INTRAVENOUS
  Filled 2014-12-05 (×4): qty 200

## 2014-12-05 MED ORDER — LATANOPROST 0.005 % OP SOLN
1.0000 [drp] | Freq: Every day | OPHTHALMIC | Status: DC
  Administered 2014-12-05 – 2014-12-10 (×6): 1 [drp] via OPHTHALMIC
  Filled 2014-12-05 (×2): qty 2.5

## 2014-12-05 MED ORDER — LEVOTHYROXINE SODIUM 75 MCG PO TABS
75.0000 ug | ORAL_TABLET | Freq: Every day | ORAL | Status: DC
  Administered 2014-12-05 – 2014-12-11 (×7): 75 ug via ORAL
  Filled 2014-12-05 (×7): qty 1

## 2014-12-05 MED ORDER — ASPIRIN EC 81 MG PO TBEC
81.0000 mg | DELAYED_RELEASE_TABLET | Freq: Every day | ORAL | Status: DC
  Administered 2014-12-05 – 2014-12-11 (×7): 81 mg via ORAL
  Filled 2014-12-05 (×7): qty 1

## 2014-12-05 MED ORDER — PIPERACILLIN-TAZOBACTAM 3.375 G IVPB 30 MIN
3.3750 g | Freq: Once | INTRAVENOUS | Status: AC
Start: 2014-12-05 — End: 2014-12-05
  Administered 2014-12-05: 3.375 g via INTRAVENOUS
  Filled 2014-12-05: qty 50

## 2014-12-05 MED ORDER — PIPERACILLIN-TAZOBACTAM 3.375 G IVPB 30 MIN
3.3750 g | Freq: Three times a day (TID) | INTRAVENOUS | Status: DC
  Filled 2014-12-05 (×2): qty 50

## 2014-12-05 MED ORDER — SODIUM CHLORIDE 0.9 % IV SOLN
INTRAVENOUS | Status: AC
  Administered 2014-12-05: 05:00:00 via INTRAVENOUS

## 2014-12-05 NOTE — Progress Notes (Signed)
Received report from ED nurse, Nehemiah SettleBrooke.

## 2014-12-05 NOTE — Progress Notes (Addendum)
Initial Nutrition Assessment  DOCUMENTATION CODES:   Severe malnutrition in context of acute illness/injury  INTERVENTION:   -Continue Ensure Enlive po BID, each supplement provides 350 kcal and 20 grams of protein -Continue 1 scoop Resource Beneprotein BID  NUTRITION DIAGNOSIS:   Malnutrition related to acute illness as evidenced by moderate depletion of body fat, moderate depletions of muscle mass.  GOAL:   Patient will meet greater than or equal to 90% of their needs  MONITOR:   PO intake, Supplement acceptance, Weight trends, Labs, Skin, I & O's  REASON FOR ASSESSMENT:   Malnutrition Screening Tool    ASSESSMENT:   Jeremy [name] is a 81 y.o. male with a history of CAD, CHF, HTN, Hyperlipidemia, and Dementia who was brought from the [facility] after he was found to have blood around his foley catheter , and staff had noticed that he was having increased vomiting, and was vomiting in his sleep. Family reports that he had increased coughing spells after the vomiting episodes, and were concerned that he may have aspirated. He was seen in the ED and found to have a RLL infiltrate on Chest X-ray and evidence of a UTI and was placed on antibiotic Rx of IV Vancomycin and Zosyn and refereed for admission.   Pt admitted with HCAP vs aspiration pneumonia.   Hx obtained from pt and family members at bedside. Pt daughter reports that pt has lost weight and had a variable appetite over the past 3 months, due to previous hospitalization and transition to rehab. He has been progressing well at SNF, however, she reveals that pt is often a selective eater. He will eat well if he receives food he likes, however, eats poorly if he does not like the food. She suspects pt has lost weight, however, unable to identify how much. She shares that pt's largest struggles is getting enough fluid in him to prevent dehydration and ensuring he has feeding assistance (due to visual  impairment). Pt has a very supportive family who are very active and involved in his care.   Pt reports he was getting both Ensure and beneprotein PTA. Per pt daughter, pt does not particularly like the taste of the supplements, but will consume them when offered to him, particularly if he does not eat well. She would like to continue with the same supplement regimen for consistency.   Per pt daughter, no observed difficulty chewing or swallowing foods and liquids.  Reviewed wt hx; no significant wt hx for time frames given.   Nutrition-Focused physical exam completed. Findings are mild to moderate fat depletion, moderate to severe muscle depletion, and no edema.   Discussed importance of good meal and supplement intake to promote healing.   Labs reviewed.   Diet Order:  Diet Heart Room service appropriate?: Yes; Fluid consistency:: Thin  Skin:  Reviewed, no issues  Last BM:  PTA  Height:   Ht Readings from Last 1 Encounters:  12/04/14 5\' 11"  (1.803 m)    Weight:   Wt Readings from Last 1 Encounters:  12/04/14 150 lb (68.04 kg)    Ideal Body Weight:  78.2 kg  BMI:  Body mass index is 20.93 kg/(m^2).  Estimated Nutritional Needs:   Kcal:  1800-2000  Protein:  85-100 grams  Fluid:  1.8-2.0 L  EDUCATION NEEDS:   Education needs addressed  Jeremy [name], RD, LDN, CDE Pager: [phone] After hours Pager: [phone]

## 2014-12-05 NOTE — Progress Notes (Signed)
Pt admitted after midnight. Please refer to admission note done 12/05/2014. Pt with history of dementia, from SNF, was suppose to be discharged home but developed vomiting and coughing in sleep. Admitted for aspiration pneumonia verus HCAP. Continue vanco and zosyn PT eval once able to participate.  Manson Passeylma Meher Kucinski [facility] [phone]

## 2014-12-05 NOTE — H&P (Signed)
[facility] Admission History and Physical       Jeremy [name] EAV:[id number] DOB: Dec 09, 1925 DOA: 12/04/2014  Referring physician: EDP PCP: [name], MD  Specialists:   Chief Complaint: Vomiting and Increased Coughing  HPI: Jeremy [name] is a 81 y.o. male with a history of CAD, CHF, HTN, Hyperlipidemia, and Dementia who was brought from the [facility] after he was found to have blood around his foley catheter , and staff had noticed that he was having increased vomiting, and was vomiting in his sleep.   Family reports that he had increased coughing spells after the vomiting episodes, and were concerned that he may have aspirated.    He was seen in the ED and found to have a RLL infiltrate on Chest X-ray and evidence of a UTI and was placed on antibiotic Rx of IV Vancomycin and Zosyn and refereed for admission.     Review of Systems: Unable to Obtain from the Patient  Past Medical History  Diagnosis Date  . Bleeding ulcer   . Spinal stenosis   . Thyroid disease   . Hypertension   . Coronary artery disease     non-ST segment elevation myocardial infarction February of 2004 she was stenting of the circumflex using a Cypher drug-eluting stent.  . Hyperlipidemia   . Claudication (HCC)   . Macular degeneration   . Altered mental status      Past Surgical History  Procedure Laterality Date  . Coronary angioplasty with stent placement    . Thyroidectomy, partial    . Renal doppler  08/17/2005    Normal evaluation  . Cardiac catheterization  02/25/2002    Proximal L Circumflex 95% lesion, stented w/ 3x18-mm CYPHER stent avoiding the 2 L Marginal branch, jailing the 1st marginal, resulting in reduction of a 90-95% lesion to 0% residual  . Cardiovascular stress test  12/18/2006    EKG negative for ischemia, no significant ischemia demonstrated.  . Transthoracic echocardiogram  03/13/2002    EF >55%, moderate LVH,   . Back surgery        Prior to  Admission medications   Medication Sig Start Date End Date Taking? Authorizing Provider  aspirin EC 81 MG tablet Take 81 mg by mouth daily.    Historical Provider, MD  clopidogrel (PLAVIX) 75 MG tablet Take 1 tablet (75 mg total) by mouth daily. 10/30/14   Runell Gess, MD  dorzolamide (TRUSOPT) 2 % ophthalmic solution Place 1 drop into the right eye 2 (two) times daily.  08/23/12   Historical Provider, MD  finasteride (PROSCAR) 5 MG tablet Take 1 tablet (5 mg total) by mouth daily. Patient taking differently: Take 5 mg by mouth every evening.  10/13/14   Albertine Grates, MD  guaiFENesin-dextromethorphan (ROBITUSSIN DM) 100-10 MG/5ML syrup Take 5 mLs by mouth every 4 (four) hours as needed for cough. 11/11/14   Richarda Overlie, MD  isosorbide mononitrate (IMDUR) 30 MG 24 hr tablet Take 1 tablet (30 mg total) by mouth daily. 10/30/14   Runell Gess, MD  latanoprost (XALATAN) 0.005 % ophthalmic solution Place 1 drop into both eyes at bedtime.    Historical Provider, MD  levothyroxine (SYNTHROID, LEVOTHROID) 75 MCG tablet Take 75 mcg by mouth daily. 11/06/14   Historical Provider, MD  losartan (COZAAR) 50 MG tablet Take 50 mg by mouth daily.  10/08/12   Historical Provider, MD  metoprolol succinate (TOPROL-XL) 50 MG 24 hr tablet Take 25 mg by mouth daily.  Take with or immediately following a meal.    Historical Provider, MD  Protein (PROCEL) POWD Take 1 scoop by mouth 2 (two) times daily.    Historical Provider, MD  QUEtiapine (SEROQUEL) 25 MG tablet Take 25 mg by mouth at bedtime. For behaviors    Historical Provider, MD  ranitidine (ZANTAC) 75 MG tablet Take 75 mg by mouth daily.    Historical Provider, MD  simvastatin (ZOCOR) 40 MG tablet Take 40 mg by mouth daily at 6 PM.     Historical Provider, MD  tamsulosin (FLOMAX) 0.4 MG CAPS capsule Take 0.4 mg by mouth daily. 10/13/14   Historical Provider, MD  timolol (TIMOPTIC) 0.5 % ophthalmic solution Place 1 drop into both eyes 2 (two) times daily.  09/18/12    Historical Provider, MD     Allergies  Allergen Reactions  . Tramadol Nausea And Vomiting    Social History:  reports that he has quit smoking. He has never used smokeless tobacco. He reports that he does not drink alcohol or use illicit drugs.    Family History  Problem Relation Age of Onset  . Family history unknown: Yes       Physical Exam:  GEN:  Pleasant Elderly Obese 81 y.o. African American male examined and in no acute distress; cooperative with exam Filed Vitals:   12/05/14 0215 12/05/14 0230 12/05/14 0300 12/05/14 0315  BP: 116/77 119/75 110/75 117/72  Pulse: 98 99 97 97  Temp:      Resp:    16  Height:      Weight:      SpO2: 98% 99% 100% 100%   Blood pressure 117/72, pulse 97, temperature 97.1 F (36.2 C), resp. rate 16, height  (1.803 m), weight 68.04 kg (150 lb), SpO2 100 %. PSYCH: He is alert and oriented x ; does not appear anxious does not appear depressed; affect is normal HEENT: Normocephalic and Atraumatic, Mucous membranes pink; PERRLA; EOM intact; Fundi:  Benign;  No scleral icterus, Nares: Patent, Oropharynx: Clear, Fair Dentition,    Neck:  FROM, No Cervical Lymphadenopathy nor Thyromegaly or Carotid Bruit; No JVD; Breasts:: Not examined CHEST WALL: No tenderness CHEST: Normal respiration, clear to auscultation bilaterally HEART: Regular rate and rhythm; no murmurs rubs or gallops BACK: No kyphosis or scoliosis; No CVA tenderness ABDOMEN: Positive Bowel Sounds, Obese, Soft Non-Tender, No Rebound or Guarding; No Masses, No Organomegaly. Rectal Exam: Not done EXTREMITIES: No Cyanosis, Clubbing, or Edema; No Ulcerations. Genitalia: not examined PULSES: 2+ and symmetric SKIN: Normal hydration no rash or ulceration CNS:  Alert and Oriented x 1,  No Focal Deficits Vascular: pulses palpable throughout    Labs on Admission:  Basic Metabolic Panel:  Recent Labs Lab 12/05/14 0055  NA 139  K 4.3  CL 109  CO2 20*  GLUCOSE 111*  BUN 31*    CREATININE 1.45*  CALCIUM 8.5*   Liver Function Tests:  Recent Labs Lab 12/01/14 12/05/14 0055  AST 24 29  ALT 20 20  ALKPHOS 98 71  BILITOT  --  1.0  PROT  --  6.9  ALBUMIN  --  3.0*   No results for input(s): LIPASE, AMYLASE in the last 168 hours. No results for input(s): AMMONIA in the last 168 hours. CBC:  Recent Labs Lab 12/05/14 0055  WBC 9.9  NEUTROABS 7.6  HGB 11.0*  HCT 34.1*  MCV 88.8  PLT 166   Cardiac Enzymes: No results for input(s): CKTOTAL, CKMB, CKMBINDEX, TROPONINI in the  last 168 hours.  BNP (last 3 results) No results for input(s): BNP in the last 8760 hours.  ProBNP (last 3 results) No results for input(s): PROBNP in the last 8760 hours.  CBG: No results for input(s): GLUCAP in the last 168 hours.  Radiological Exams on Admission: Dg Chest 2 View  12/05/2014  CLINICAL DATA:  81 year old male with productive cough EXAM: CHEST  2 VIEW COMPARISON:  Radiograph dated 11/10/2014 FINDINGS: Two views of the chest demonstrate focal area of mild increased vascular and interstitial prominence in the right lower lung field and right infrahilar region this may represent focal area of subsegmental atelectasis. Developing pneumonia is not excluded. Clinical correlation and follow-up recommended. There is no focal consolidation, pleural effusion or pneumothorax. The aorta is tortuous. Stable cardiac silhouette. The osseous structures are grossly unremarkable. IMPRESSION: Focal right lower lung field subsegmental atelectasis versus developing pneumonia. Clinical correlation is recommended. Electronically Signed   By: Elgie CollardArash  Radparvar M.D.   On: 12/05/2014 00:55     EKG: Independently reviewed.         Assessment/Plan:      81 y.o. male with  Principal Problem:   1.     HCAP (healthcare-associated pneumonia)   IV Vancomycin and Zosyn   IVFs   Active Problems:   2.     UTI (lower urinary tract infection)   On IV Vancomycin and Zosyn   Urine C+S  sent     3.     Coronary artery disease   Continue ASA, Plavix, Imdur, Metoprolol, Losartan and Simvastatin Rx   Cardiac Monitoring     4.     Essential hypertension   On Metoprolol, and Losartan Rx   Monitor BPs     5.     Hyperlipidemia   On Simvastatin Rx      6.     Hypothyroidism   On Levothyroxine Rx     7.     AKI   Hold Losartan Rx   Monitor BUN/Cr     8.     Anemia   Monitor Trend      9     Dementia   Chronic, on Seroquel Rx    8.     DVT Prophylaxis   Lovenox     Code Status:     FULL CODE       Family Communication:   Daughters  X 3 at Bedside     Disposition Plan:    Inpatient Status        Time spent:  3370 Minutes      Ron ParkerJENKINS,Rokia Bosket C [facility] Pager [phone]   If 7AM -7PM Please Contact the Day Rounding Team MD for [facility]  If 7PM-7AM, Please Contact Night-Floor Coverage  [email or web address] Password [email or web address] 12/05/2014, 3:33 AM     ADDENDUM:   Patient was seen and examined on 12/05/2014

## 2014-12-05 NOTE — Progress Notes (Signed)
NURSING PROGRESS NOTE  Pershing CoxJames T Aquilar [id number] Admission Data: 12/05/2014 5:59 AM Attending Provider: Ron ParkerHarvette C Jenkins, MD HQI:[id number],WUXLKPCP:SANDERS,ROBYN N, MD Code Status: Full  Allergies:  Tramadol Past Medical History:   has a past medical history of Bleeding ulcer; Spinal stenosis; Thyroid disease; Hypertension; Coronary artery disease; Hyperlipidemia; Claudication (HCC); Macular degeneration; and Altered mental status. Past Surgical History:   has past surgical history that includes Coronary angioplasty with stent; Thyroidectomy, partial; RENAL DOPPLER (08/17/2005); Cardiac catheterization (02/25/2002); Cardiovascular stress test (12/18/2006); transthoracic echocardiogram (03/13/2002); and Back surgery. Social History:   reports that he has quit smoking. He has never used smokeless tobacco. He reports that he does not drink alcohol or use illicit drugs.  Pershing CoxJames T Magley is a 81 y.o. male patient admitted from ED:   Last Documented Vital Signs: Blood pressure 108/69, pulse 89, temperature 98.2 F (36.8 C), temperature source Oral, resp. rate 18, height 5\' 11"  (1.803 m), weight 68.04 kg (150 lb), SpO2 100 %.  Cardiac Monitoring:   IV Fluids:  IV in place, occlusive dsg intact without redness, IV cath antecubital left, condition patent and no redness normal saline.   Skin: Dry and intact  Patient/Family orientated to room. Information packet given to patient/family. Admission inpatient armband information verified with patient/family to include name and date of birth and placed on patient arm. Side rails up x 2, fall assessment and education completed with patient/family. Patient/family able to verbalize understanding of risk associated with falls and verbalized understanding to call for assistance before getting out of bed. Call light within reach. Patient/family able to voice and demonstrate understanding of unit orientation instructions.    Will continue to evaluate and treat per MD  orders.  Sue LushKaelin Romesberg RN, BSN

## 2014-12-05 NOTE — Progress Notes (Signed)
ANTIBIOTIC CONSULT NOTE - INITIAL  Pharmacy Consult for Vancomycin x 8 days Indication: PNA  Allergies  Allergen Reactions  . Tramadol Nausea And Vomiting    Patient Measurements: Height: 5\' 11"  (180.3 cm) Weight: 150 lb (68.04 kg) IBW/kg (Calculated) : 75.3  Vital Signs: Temp: 97.1 F (36.2 C) (12/02 2203) BP: 117/72 mmHg (12/03 0315) Pulse Rate: 97 (12/03 0315) Intake/Output from previous day: 12/02 0701 - 12/03 0700 In: 550 [I.V.:550] Out: -  Intake/Output from this shift: Total I/O In: 550 [I.V.:550] Out: -   Labs:  Recent Labs  12/05/14 0055  WBC 9.9  HGB 11.0*  PLT 166  CREATININE 1.45*   Estimated Creatinine Clearance: 34.5 mL/min (by C-G formula based on Cr of 1.45). No results for input(s): VANCOTROUGH, VANCOPEAK, VANCORANDOM, GENTTROUGH, GENTPEAK, GENTRANDOM, TOBRATROUGH, TOBRAPEAK, TOBRARND, AMIKACINPEAK, AMIKACINTROU, AMIKACIN in the last 72 hours.   Microbiology: Recent Results (from the past 720 hour(s))  Urine culture     Status: None   Collection Time: 11/06/14  1:45 PM  Result Value Ref Range Status   Specimen Description URINE, RANDOM  Final   Special Requests NONE  Final   Culture NO GROWTH 1 DAY  Final   Report Status 11/08/2014 FINAL  Final  MRSA PCR Screening     Status: None   Collection Time: 11/06/14  9:30 PM  Result Value Ref Range Status   MRSA by PCR NEGATIVE NEGATIVE Final    Comment:        The GeneXpert MRSA Assay (FDA approved for NASAL specimens only), is one component of a comprehensive MRSA colonization surveillance program. It is not intended to diagnose MRSA infection nor to guide or monitor treatment for MRSA infections.   Culture, blood (routine x 2)     Status: None   Collection Time: 11/07/14 12:05 PM  Result Value Ref Range Status   Specimen Description BLOOD RIGHT HAND  Final   Special Requests BOTTLES DRAWN AEROBIC ONLY 5CC  Final   Culture NO GROWTH 5 DAYS  Final   Report Status 11/12/2014 FINAL   Final  Urine culture     Status: None   Collection Time: 11/08/14  3:34 AM  Result Value Ref Range Status   Specimen Description URINE, CATHETERIZED  Final   Special Requests NONE  Final   Culture NO GROWTH 1 DAY  Final   Report Status 11/09/2014 FINAL  Final    Medical History: Past Medical History  Diagnosis Date  . Bleeding ulcer   . Spinal stenosis   . Thyroid disease   . Hypertension   . Coronary artery disease     non-ST segment elevation myocardial infarction February of 2004 she was stenting of the circumflex using a Cypher drug-eluting stent.  . Hyperlipidemia   . Claudication (HCC)   . Macular degeneration   . Altered mental status     Medications:  Awaiting med rec  Assessment: 81 y.o. M presents from NH with coughing and vomiting. Pt received Vanc 1gm and zosyn 3.375gm IV in ED ~0200. To continue Vancomycin and Zosyn as inpatient for PNA. Afeb. WBC wnl. Estimated CrCl 35 ml/min.  Goal of Therapy:  Vancomycin trough level 15-20 mcg/ml  Plan:  Vancomycin 1gm IV q24h x 8 days Will f/u micro data, renal function, and pt's clinical condition  Christoper Fabianaron Sunshyne Horvath, PharmD, BCPS Clinical pharmacist, pager [phone] 12/05/2014,4:01 AM

## 2014-12-06 ENCOUNTER — Encounter (HOSPITAL_COMMUNITY): Payer: Self-pay | Admitting: Internal Medicine

## 2014-12-06 DIAGNOSIS — D631 Anemia in chronic kidney disease: Secondary | ICD-10-CM

## 2014-12-06 DIAGNOSIS — E785 Hyperlipidemia, unspecified: Secondary | ICD-10-CM | POA: Diagnosis present

## 2014-12-06 DIAGNOSIS — N400 Enlarged prostate without lower urinary tract symptoms: Secondary | ICD-10-CM | POA: Diagnosis present

## 2014-12-06 DIAGNOSIS — F32A Depression, unspecified: Secondary | ICD-10-CM | POA: Diagnosis present

## 2014-12-06 DIAGNOSIS — N183 Chronic kidney disease, stage 3 unspecified: Secondary | ICD-10-CM | POA: Diagnosis present

## 2014-12-06 DIAGNOSIS — E038 Other specified hypothyroidism: Secondary | ICD-10-CM

## 2014-12-06 DIAGNOSIS — E43 Unspecified severe protein-calorie malnutrition: Secondary | ICD-10-CM | POA: Diagnosis present

## 2014-12-06 DIAGNOSIS — G9340 Encephalopathy, unspecified: Secondary | ICD-10-CM

## 2014-12-06 DIAGNOSIS — I2510 Atherosclerotic heart disease of native coronary artery without angina pectoris: Secondary | ICD-10-CM | POA: Diagnosis present

## 2014-12-06 DIAGNOSIS — F329 Major depressive disorder, single episode, unspecified: Secondary | ICD-10-CM

## 2014-12-06 DIAGNOSIS — I5032 Chronic diastolic (congestive) heart failure: Secondary | ICD-10-CM

## 2014-12-06 DIAGNOSIS — J690 Pneumonitis due to inhalation of food and vomit: Secondary | ICD-10-CM | POA: Diagnosis present

## 2014-12-06 NOTE — Progress Notes (Signed)
Patient ID: Jeremy [name], male   DOB: May 10, 1925, 81 y.o.   MRN: [id number] [facility] PROGRESS NOTE  Jeremy [name] NWG:[id number] DOB: May 10, 1925 DOA: 12/04/2014 PCP: Gwynneth AlimentSANDERS,ROBYN N, MD  Brief narrative:    81 y.o. male with a history of CAD, diastolic CHF ( 2 D ECHO in 01/2014 with EF 55% and grade 1 DD), HTN, Hyperlipidemia, and Dementia who [facility] after he was found to have blood around his foley catheter. In addition, he was found to have vomit while he was at sleep but unclear at which point pt vomited. In ED, CXR demonstrated focal right lower lobe pneumonia in addition to UTI. He was started on vanco and zosyn while awaiting urine culture results.   Anticipated discharge: Likely by 12/09/2014.  Assessment/Plan:    Principal Problem:   HCAP (healthcare-associated pneumonia) / Aspiration pneumonia of right lower lobe due to regurgitated food (HCC) - Pt from SNF - His CXR showed focal right lower lobe pneumonia - Started on vanco and zosyn - Apprecaite SLP evaluation - Stable respiratory status - Continue oxygen support via [place] to keep O2 sats above 90%  Active Problems:   UTI (lower urinary tract infection) - UA on admission showed large leukocytes and many bacteria - Started on vanco and zsoyn on admission - Urine culture pending    Essential hypertension - Continue Imdur but monitor BP closely since SBP in 90's after he was given Imdur    Coronary artery disease involving native coronary artery of native heart without angina pectoris / Chronic diastolic CHF - Stable - Compensated - Last 2 DE CHO in 01/2014 with EF of 55% and grade 1 diastolic dysfunction - Continue aspirin and plavix - Continue simvastatin     Hypothyroidism - Continue synthroid     Acute encephalopathy/ Dementia without behavioral disturbance - No significant changes in mental status - Continue to monitor     CKD (chronic kidney disease)  stage 3, GFR 30-59 ml/min - Baseline Cr about 1.4 and on this admission within baseline range     Anemia of chronic renal failure, stage 3 (moderate) - Hemoglobin stable at 11.1 - No current indications for transfusion       Protein-calorie malnutrition, severe - Seen by dietician - Continue nutritional supplementation     Dyslipidemia - Continue statin therapy     BPH (benign prostatic hyperplasia) - Continue Flomax and Proscar    Depression - Continue Seroquel   DVT Prophylaxis  - Lovenox subQ ordered    Code Status: Full.  Family Communication:  Discussed with the family (daughter and wife) 12/3. Family not at the bedside this am. Disposition Plan: to SNF likely by 12/09/2014.  IV access:  Peripheral IV  Procedures and diagnostic studies:    Dg Chest 2 View 12/05/2014  Focal right lower lung field subsegmental atelectasis versus developing pneumonia. Clinical correlation is recommended. Electronically Signed   By: Elgie CollardArash  Radparvar M.D.   On: 12/05/2014 00:55   Medical Consultants:  None   Other Consultants:  PT Nutrition  IAnti-Infectives:   Zosyn 12/05/2014 --> Vanco 12/05/2014 -->   Manson PasseyEVINE, Daniel Johndrow, MD  [facility] Pager [phone]  Time spent in minutes: 25 minutes  If 7PM-7AM, please contact night-coverage [email or web address] Password [email or web address] 12/06/2014, 12:19 PM   LOS: 1 day    HPI/Subjective: No acute overnight events. No respiratory distress.   Objective: Filed Vitals:   12/05/14 2031 12/06/14 0007 12/06/14 0430 12/06/14 1027  BP: 114/56 145/80 144/83 85/54  Pulse: 79 92 87 100  Temp: 98.8 F (37.1 C) 99.1 F (37.3 C) 99.4 F (37.4 C) 98.4 F (36.9 C)  TempSrc: Oral Oral Oral Oral  Resp: Height:      Weight:      SpO2: 100% 100% 99% 98%    Intake/Output Summary (Last 24 hours) at 12/06/14 1219 Last data filed at 12/06/14 1046  Gross per 24 hour  Intake    770 ml  Output    500 ml  Net    270 ml    Exam:   General:   Pt is not in acute distress  Cardiovascular: Regular rate and rhythm, S1/S2 (+)  Respiratory: Clear to auscultation bilaterally, no wheezing, no crackles, no rhonchi  Abdomen: Soft, non tender, non distended, bowel sounds present  Extremities: No edema, pulses DP and PT palpable bilaterally  Neuro: Grossly nonfocal  Data Reviewed: Basic Metabolic Panel:  Recent Labs Lab 12/05/14 0055 12/05/14 0557  NA 139 139  K 4.3 3.6  CL 109 110  CO2 20* 22  GLUCOSE 111* 110*  BUN 31* 30*  CREATININE 1.45* 1.46*  CALCIUM 8.5* 8.6*   Liver Function Tests:  Recent Labs Lab 12/01/14 12/05/14 0055  AST 24 29  ALT 20 20  ALKPHOS 98 71  BILITOT  --  1.0  PROT  --  6.9  ALBUMIN  --  3.0*   No results for input(s): LIPASE, AMYLASE in the last 168 hours. No results for input(s): AMMONIA in the last 168 hours. CBC:  Recent Labs Lab 12/05/14 0055 12/05/14 0557  WBC 9.9 10.3  NEUTROABS 7.6  --   HGB 11.0* 11.1*  HCT 34.1* 33.9*  MCV 88.8 88.3  PLT 166 211   Cardiac Enzymes: No results for input(s): CKTOTAL, CKMB, CKMBINDEX, TROPONINI in the last 168 hours. BNP: Invalid input(s): POCBNP CBG: No results for input(s): GLUCAP in the last 168 hours.  No results found for this or any previous visit (from the past 240 hour(s)).   Scheduled Meds: . aspirin EC  81 mg Oral Daily  . clopidogrel  75 mg Oral Daily  . dorzolamide  1 drop Right Eye BID  . enoxaparin (LOVENOX) injection  40 mg Subcutaneous Q24H  . famotidine  10 mg Oral BID  . feeding supplement (ENSURE ENLIVE)  237 mL Oral BID BM  . finasteride  5 mg Oral QPM  . isosorbide mononitrate  30 mg Oral Daily  . latanoprost  1 drop Both Eyes QHS  . levothyroxine  75 mcg Oral QAC breakfast  . piperacillin-tazobactam (ZOSYN)  IV  3.375 g Intravenous Q8H  . protein supplement  1 scoop Oral BID  . QUEtiapine  25 mg Oral QHS  . simvastatin  40 mg Oral q1800  . tamsulosin  0.4 mg Oral Daily  . timolol  1 drop Both Eyes BID   . vancomycin  1,000 mg Intravenous Q24H

## 2014-12-07 LAB — URINE CULTURE: Culture: >=100000

## 2014-12-07 LAB — STREP PNEUMONIAE URINARY ANTIGEN: Strep Pneumo Urinary Antigen: NEGATIVE

## 2014-12-07 LAB — MRSA PCR SCREENING: MRSA BY PCR: NEGATIVE

## 2014-12-07 MED ORDER — SENNOSIDES-DOCUSATE SODIUM 8.6-50 MG PO TABS
1.0000 | ORAL_TABLET | Freq: Every day | ORAL | Status: DC
  Administered 2014-12-07 – 2014-12-10 (×4): 1 via ORAL
  Filled 2014-12-07 (×3): qty 1

## 2014-12-07 NOTE — Progress Notes (Signed)
Patient ID: Jeremy [name], male   DOB: 03/16/1925, 81 y.o.   MRN: [id number] [facility] PROGRESS NOTE  Jeremy [name]:[id number] DOB: 19-Apr-1925 DOA: 12/04/2014 PCP: [name], MD  Brief narrative:    81 y.o. male with a history of CAD, diastolic CHF ( 2 D ECHO in 01/2014 with EF 55% and grade 1 DD), HTN, Hyperlipidemia, and Dementia who presented to [facility] ED from the [facility] after he was found to have blood around his foley catheter. In addition, he was found to have vomit while he was at sleep but unclear at which point pt vomited. In ED, CXR demonstrated focal right lower lobe pneumonia in addition to UTI. He was started on vanco and zosyn while awaiting urine culture results.   Anticipated discharge: SNF 12/09/2014.  Assessment/Plan:    Principal Problem:   HCAP (healthcare-associated pneumonia) / Aspiration pneumonia of right lower lobe due to regurgitated food (HCC) - Pt from SNF - His CXR showed focal right lower lobe pneumonia - Started on vanco and zosyn - Apprecaite SLP evaluation - Stable respiratory status - Continue oxygen support via [place] to keep O2 sats above 90% - no blood cultures done on admission    UTI (lower urinary tract infection) - UA on admission showed large leukocytes and many bacteria - Started on vanco and zsoyn on admission - Urine culture pending- gram neg rods- de-escalate abx once culture back    Essential hypertension - Continue Imdur but monitor BP closely since SBP in 90's after he was given Imdur    Coronary artery disease involving native coronary artery of native heart without angina pectoris / Chronic diastolic CHF - Stable - Compensated - Last 2 DE CHO in 01/2014 with EF of 55% and grade 1 diastolic dysfunction - Continue aspirin and plavix - Continue simvastatin     Hypothyroidism - Continue synthroid     Acute encephalopathy/ Dementia without behavioral disturbance - No significant changes in mental  status - Continue to monitor     CKD (chronic kidney disease) stage 3, GFR 30-59 ml/min - Baseline Cr about 1.4 and on this admission within baseline range     Anemia of chronic renal failure, stage 3 (moderate) - Hemoglobin stable  - No current indications for transfusion       Protein-calorie malnutrition, severe - Seen by dietician - Continue nutritional supplementation     Dyslipidemia - Continue statin therapy     BPH (benign prostatic hyperplasia) - Continue Flomax and Proscar    Depression - Continue Seroquel   DVT Prophylaxis  - Lovenox subQ ordered    Code Status: Full.  Family Communication:  Discussed with the family (daughter). Disposition Plan: to SNF likely by 12/09/2014.  IV access:  Peripheral IV  Procedures and diagnostic studies:    Dg Chest 2 View 12/05/2014  Focal right lower lung field subsegmental atelectasis versus developing pneumonia. Clinical correlation is recommended. Electronically Signed   By: Elgie Collard M.D.   On: 12/05/2014 00:55   Medical Consultants:  None   Other Consultants:  PT Nutrition  IAnti-Infectives:   Zosyn 12/05/2014 --> Vanco 12/05/2014 -->   JESSICA UPCHURCH VANN, DO  [facility] Pager [phone]  Time spent in minutes: 25 minutes  If 7PM-7AM, please contact night-coverage [email or web address] Password [email or web address] 12/07/2014, 8:30 AM   LOS: 2 days    HPI/Subjective: Daughter says he is doing better today   Objective: Filed Vitals:   12/06/14 2056 12/06/14 2319 12/07/14 8657  12/07/14 0408  BP: 135/88  116/71 140/79  Pulse: 95  76 76  Temp: 101.8 F (38.8 C) 99.3 F (37.4 C) 98.4 F (36.9 C) 98.3 F (36.8 C)  TempSrc: Oral Oral Oral Oral  Resp: Height:      Weight:      SpO2: 100%  99% 100%    Intake/Output Summary (Last 24 hours) at 12/07/14 0830 Last data filed at 12/07/14 9604  Gross per 24 hour  Intake    420 ml  Output   1300 ml  Net   -880 ml    Exam:   General:   Pleasant/cooperative  Cardiovascular: Regular rate and rhythm, S1/S2 (+)  Respiratory: Clear to auscultation bilaterally, no wheezing, no crackles, no rhonchi  Abdomen: Soft, non tender, non distended, bowel sounds present  Extremities: No edema, pulses DP and PT palpable bilaterally  Neuro: Grossly nonfocal  Data Reviewed: Basic Metabolic Panel:  Recent Labs Lab 12/05/14 0055 12/05/14 0557  NA 139 139  K 4.3 3.6  CL 109 110  CO2 20* 22  GLUCOSE 111* 110*  BUN 31* 30*  CREATININE 1.45* 1.46*  CALCIUM 8.5* 8.6*   Liver Function Tests:  Recent Labs Lab 12/01/14 12/05/14 0055  AST 24 29  ALT 20 20  ALKPHOS 98 71  BILITOT  --  1.0  PROT  --  6.9  ALBUMIN  --  3.0*   No results for input(s): LIPASE, AMYLASE in the last 168 hours. No results for input(s): AMMONIA in the last 168 hours. CBC:  Recent Labs Lab 12/05/14 0055 12/05/14 0557  WBC 9.9 10.3  NEUTROABS 7.6  --   HGB 11.0* 11.1*  HCT 34.1* 33.9*  MCV 88.8 88.3  PLT 166 211   Cardiac Enzymes: No results for input(s): CKTOTAL, CKMB, CKMBINDEX, TROPONINI in the last 168 hours. BNP: Invalid input(s): POCBNP CBG: No results for input(s): GLUCAP in the last 168 hours.  Recent Results (from the past 240 hour(s))  Urine culture     Status: None (Preliminary result)   Collection Time: 12/05/14 12:39 AM  Result Value Ref Range Status   Specimen Description URINE, CATHETERIZED  Final   Special Requests NONE  Final   Culture >=100,000 COLONIES/mL GRAM NEGATIVE RODS  Final   Report Status PENDING  Incomplete  Urine culture     Status: None (Preliminary result)   Collection Time: 12/05/14  1:45 AM  Result Value Ref Range Status   Specimen Description URINE, CATHETERIZED  Final   Special Requests NONE  Final   Culture >=100,000 COLONIES/mL GRAM NEGATIVE RODS  Final   Report Status PENDING  Incomplete     Scheduled Meds: . aspirin EC  81 mg Oral Daily  . clopidogrel  75 mg Oral Daily  . dorzolamide  1  drop Right Eye BID  . enoxaparin (LOVENOX) injection  40 mg Subcutaneous Q24H  . famotidine  10 mg Oral BID  . feeding supplement (ENSURE ENLIVE)  237 mL Oral BID BM  . finasteride  5 mg Oral QPM  . isosorbide mononitrate  30 mg Oral Daily  . latanoprost  1 drop Both Eyes QHS  . levothyroxine  75 mcg Oral QAC breakfast  . piperacillin-tazobactam (ZOSYN)  IV  3.375 g Intravenous Q8H  . protein supplement  1 scoop Oral BID  . QUEtiapine  25 mg Oral QHS  . simvastatin  40 mg Oral q1800  . tamsulosin  0.4 mg Oral Daily  . timolol  1 drop Both Eyes BID  . vancomycin  1,000 mg Intravenous Q24H

## 2014-12-07 NOTE — Evaluation (Signed)
Clinical/Bedside Swallow Evaluation Patient Details  Name: Jeremy [name] MRN: [id number] Date of Birth: Aug 01, 1925  Today's Date: 12/07/2014 Time: SLP Start Time (ACUTE ONLY): 0855 SLP Stop Time (ACUTE ONLY): 0919 SLP Time Calculation (min) (ACUTE ONLY): 24 min  Past Medical History: History reviewed. No pertinent past medical history. Past Surgical History:  Past Surgical History  Procedure Laterality Date  . Coronary angioplasty with stent placement    . Thyroidectomy, partial    . Renal doppler  08/17/2005    Normal evaluation  . Cardiac catheterization  02/25/2002    Proximal L Circumflex 95% lesion, stented w/ 3x18-mm CYPHER stent avoiding the 2 L Marginal branch, jailing the 1st marginal, resulting in reduction of a 90-95% lesion to 0% residual  . Cardiovascular stress test  12/18/2006    EKG negative for ischemia, no significant ischemia demonstrated.  . Transthoracic echocardiogram  03/13/2002    EF >55%, moderate LVH,   . Back surgery     HPI:  81 y.o. male with a history of CAD, CHF, HTN, Hyperlipidemia, and Dementia who was brought from the [facility] after he was found to have blood around his foley catheter and staff had noticed that he was having increased vomiting, and was vomiting in his sleep. Family reports that he had increased coughing spells after the vomiting episodes, and were concerned that he may have aspirated.CXR RLL infiltrate and evidence of a UTI. BSE 11/08/14 with complaints of pt holding food; oral/pharyngeal swallow was WFL's, regular thin recommended and precautions reviewed with pt. and family.   Assessment / Plan / Recommendation Clinical Impression  Pt pleasantly confused, seen for swallow assesment with daughter at bedside who reports no difficulty swallowing past month (since previous admission). Oral mastication and transit functional with intermittent minimal lingual residue cleared with liquid wash. No s/s aspiration; pna  possibly due to aspirating emesis. Educated daughter re: possible inability to safely or efficiently consume po's as dementia progresses. Continue regular texture and thin l iquids, pills crushed if unable to take whole in applesauce, ensure clearance of oral cavity at end of meal. No further ST needed.    Aspiration Risk   (mild-mod)    Diet Recommendation   regular, thin  Medication Administration: Crushed with puree    Other  Recommendations Oral Care Recommendations: Oral care BID   Follow up Recommendations  None    Frequency and Duration            Prognosis        Swallow Study   General HPI: 81 y.o. male with a history of CAD, CHF, HTN, Hyperlipidemia, and Dementia who was brought from the [facility] after he was found to have blood around his foley catheter and staff had noticed that he was having increased vomiting, and was vomiting in his sleep. Family reports that he had increased coughing spells after the vomiting episodes, and were concerned that he may have aspirated.CXR RLL infiltrate and evidence of a UTI. BSE 11/08/14 with complaints of pt holding food; oral/pharyngeal swallow was WFL's, regular thin recommended and precautions reviewed with pt. and family. Type of Study: Bedside Swallow Evaluation Previous Swallow Assessment:  (see HPI) Diet Prior to this Study: Regular;Thin liquids Temperature Spikes Noted: No Respiratory Status: Room air History of Recent Intubation: No Behavior/Cognition: Alert;Cooperative;Pleasant mood;Confused;Requires cueing Oral Cavity Assessment: Within Functional Limits Oral Care Completed by SLP: No Oral Cavity - Dentition:  (partials) Vision: Impaired for self-feeding Self-Feeding Abilities: Total assist Patient  Positioning: Upright in bed Baseline Vocal Quality: Normal Volitional Cough: Weak Volitional Swallow: Able to elicit    Oral/Motor/Sensory Function Overall Oral Motor/Sensory Function: Within  functional limits   Ice Chips Ice chips: Not tested   Thin Liquid Thin Liquid: Within functional limits Presentation: Straw    Nectar Thick Nectar Thick Liquid: Not tested   Honey Thick Honey Thick Liquid: Not tested   Puree Puree: Within functional limits   Solid Solid: Impaired Presentation: Spoon Oral Phase Impairments: Reduced lingual movement/coordination Oral Phase Functional Implications: Oral residue (min) Pharyngeal Phase Impairments:  (none)       Brayant Dorr, Breck CoonsLisa Willis 12/07/2014,9:28 AM  Breck CoonsLisa Willis Lonell FaceLitaker M.Ed [employer] Pager [phone]

## 2014-12-07 NOTE — Care Management Note (Signed)
Case Management Note  Patient Details  Name: Jeremy [name] MRN: [id number] Date of Birth: 08-Oct-1925  Subjective/Objective:                 Patient admitted from [facility] with HCAP. Alert to self, chronic foley, febrile overnight. Continues to receive abx.    Action/Plan:  Anticipate DC to SNF as facilitated by CSW when medically stable. Expected Discharge Date:                  Expected Discharge Plan:  Skilled Nursing Facility  In-House Referral:  Clinical Social Work  Discharge planning Services  CM Consult  Post Acute Care Choice:    Choice offered to:     DME Arranged:    DME Agency:     HH Arranged:    HH Agency:     Status of Service:  Completed, signed off  Medicare Important Message Given:    Date Medicare IM Given:    Medicare IM give by:    Date Additional Medicare IM Given:    Additional Medicare Important Message give by:     If discussed at Long Length of Stay Meetings, dates discussed:    Additional Comments:  Jeremy SabalDebbie Saiya Crist, RN 12/07/2014, 2:56 PM

## 2014-12-07 NOTE — Evaluation (Signed)
Physical Therapy Evaluation Patient Details Name: Jeremy [name] MRN: [id number] DOB: 1925/10/22 Today's Date: 12/07/2014   History of Present Illness  Pt adm from SNF with PNA after aspiration after vomiting. PMH -dementia, visual impairment, CAD, CHF, HTN  Clinical Impression  Pt admitted with above diagnosis and presents to PT with functional limitations due to deficits listed below (See PT problem list). Pt needs skilled PT to maximize independence and safety to allow discharge to back to SNF for further therapy.      Follow Up Recommendations SNF    Equipment Recommendations  None recommended by PT    Recommendations for Other Services       Precautions / Restrictions Precautions Precautions: Fall      Mobility  Bed Mobility Overal bed mobility: Needs Assistance Bed Mobility: Supine to Sit     Supine to sit: Max assist     General bed mobility comments: Assist to bring legs over and elevate trunk. Pt pushing into extension.  Transfers Overall transfer level: Needs assistance Equipment used: Rolling walker (2 wheeled) Transfers: Sit to/from Stand Sit to Stand: Mod assist         General transfer comment: Assist to bring hips up. Had pt place hands on walker to encourage anterior weight shift.  Ambulation/Gait Ambulation/Gait assistance: Mod assist;+2 safety/equipment Ambulation Distance (Feet): 35 Feet Assistive device: Rolling walker (2 wheeled) Gait Pattern/deviations: Step-through pattern;Decreased step length - right;Decreased step length - left;Shuffle;Trunk flexed Gait velocity: decr Gait velocity interpretation: <1.8 ft/sec, indicative of risk for recurrent falls General Gait Details: Verbal cues to stand more upright. Great difficulty turning around and backing up.  Stairs            Wheelchair Mobility    Modified Rankin (Stroke Patients Only)       Balance Overall balance assessment: Needs assistance Sitting-balance support:  Bilateral upper extremity supported Sitting balance-Leahy Scale: Poor Sitting balance - Comments: Sat EOB x 10 minutes with min A to supervision. Postural control: Left lateral lean Standing balance support: Bilateral upper extremity supported Standing balance-Leahy Scale: Poor Standing balance comment: support of walker and min to mod A for static standing. Posterior lean                             Pertinent Vitals/Pain Pain Assessment: Faces Faces Pain Scale: Hurts little more Pain Location: rt shoulder with attempts to reach Pain Descriptors / Indicators: Grimacing;Guarding Pain Intervention(s): Limited activity within patient's tolerance;Monitored during session;Repositioned    Home Living Family/patient expects to be discharged to:: Skilled nursing facility                      Prior Function Level of Independence: Needs assistance   Gait / Transfers Assistance Needed: Amb with assist at SNF.           Hand Dominance   Dominant Hand: Right    Extremity/Trunk Assessment   Upper Extremity Assessment: Generalized weakness;RUE deficits/detail RUE Deficits / Details: Limited shoulder ROM and strength         Lower Extremity Assessment: Generalized weakness         Communication   Communication: No difficulties  Cognition Arousal/Alertness: Awake/alert Behavior During Therapy: WFL for tasks assessed/performed Overall Cognitive Status: History of cognitive impairments - at baseline                      General Comments  Exercises        Assessment/Plan    PT Assessment Patient needs continued PT services  PT Diagnosis Difficulty walking;Abnormality of gait;Generalized weakness   PT Problem List Decreased strength;Decreased balance;Decreased mobility;Decreased knowledge of use of DME;Decreased knowledge of precautions;Decreased cognition  PT Treatment Interventions DME instruction;Gait training;Functional mobility  training;Therapeutic activities;Therapeutic exercise;Balance training;Patient/family education   PT Goals (Current goals can be found in the Care Plan section) Acute Rehab PT Goals Patient Stated Goal: Pt unable to state PT Goal Formulation: With patient/family Time For Goal Achievement: 12/21/14 Potential to Achieve Goals: Fair    Frequency Min 2X/week   Barriers to discharge        Co-evaluation               End of Session Equipment Utilized During Treatment: Gait belt Activity Tolerance: Patient tolerated treatment well Patient left: in chair;with call bell/phone within reach;with chair alarm set;with family/visitor present Nurse Communication: Mobility status         Time: 4098-1191 PT Time Calculation (min) (ACUTE ONLY): 33 min   Charges:   PT Evaluation $Initial PT Evaluation Tier I: 1 Procedure PT Treatments $Gait Training: 8-22 mins   PT G Codes:        Jeremy [name] 2015/01/06, 2:39 PM [facility] PT [phone]

## 2014-12-08 LAB — URINE CULTURE: Culture: >=100000

## 2014-12-08 MED ORDER — CIPROFLOXACIN IN D5W 400 MG/200ML IV SOLN
400.0000 mg | INTRAVENOUS | Status: DC
  Administered 2014-12-08: 400 mg via INTRAVENOUS
  Filled 2014-12-08: qty 200

## 2014-12-08 NOTE — Progress Notes (Signed)
Physical Therapy Treatment Patient Details Name: Jeremy [name] MRN: [id number] DOB: 1925/04/06 Today's Date: 12/08/2014    History of Present Illness Pt adm from SNF with PNA after aspiration after vomiting. PMH -dementia, visual impairment, CAD, CHF, HTN    PT Comments    Pt requiring extensive assistance for mobility. Severe dementia is significant limitation with all mobility.  Follow Up Recommendations  SNF     Equipment Recommendations  None recommended by PT    Recommendations for Other Services       Precautions / Restrictions Precautions Precautions: Fall    Mobility  Bed Mobility Overal bed mobility: Needs Assistance Bed Mobility: Sit to Supine       Sit to supine: +2 for physical assistance;Max assist   General bed mobility comments: Assist to bring legs up into bed and to control descent of trunk.  Transfers Overall transfer level: Needs assistance Equipment used: Rolling walker (2 wheeled) Transfers: Sit to/from Stand Sit to Stand: +2 physical assistance;Mod assist         General transfer comment: Assist to bring hips up. Heavy posterior lean  Ambulation/Gait Ambulation/Gait assistance: +2 physical assistance;Mod assist;Max assist Ambulation Distance (Feet): 60 Feet Assistive device: Rolling walker (2 wheeled) Gait Pattern/deviations: Step-to pattern;Step-through pattern;Decreased step length - right;Decreased step length - left;Shuffle;Leaning posteriorly;Trunk flexed Gait velocity: decr Gait velocity interpretation: <1.8 ft/sec, indicative of risk for recurrent falls General Gait Details: Pt with intermittent heavy posterior lean requiring 2 person assist. Pt easily distracted and would let go of walker or try to reach down and grab catheter tubing. Pt began crying and praying during amb and difficult getting pt back on task.   Stairs            Wheelchair Mobility    Modified Rankin (Stroke Patients Only)       Balance Overall  balance assessment: Needs assistance Sitting-balance support: Bilateral upper extremity supported Sitting balance-Leahy Scale: Poor Sitting balance - Comments: Pt with rt lateral lean in recliner. Postural control: Right lateral lean Standing balance support: Bilateral upper extremity supported Standing balance-Leahy Scale: Poor Standing balance comment: walker and mod to max assist due to posterior lean.                    Cognition Arousal/Alertness: Awake/alert Behavior During Therapy: WFL for tasks assessed/performed Overall Cognitive Status: History of cognitive impairments - at baseline                      Exercises      General Comments        Pertinent Vitals/Pain Pain Assessment: Faces Faces Pain Scale: No hurt    Home Living                      Prior Function            PT Goals (current goals can now be found in the care plan section) Acute Rehab PT Goals Patient Stated Goal: Pt unable to state Progress towards PT goals: Progressing toward goals    Frequency  Min 2X/week    PT Plan Current plan remains appropriate    Co-evaluation             End of Session Equipment Utilized During Treatment: Gait belt Activity Tolerance: Patient tolerated treatment well Patient left: with call bell/phone within reach;with family/visitor present;in bed;with bed alarm set     Time: [id number] PT Time Calculation (min) (ACUTE ONLY): 24  min  Charges:  $Gait Training: 23-37 mins                    G Codes:      Gennie Eisinger 2014-12-09, 2:53 PM [facility] PT [phone]

## 2014-12-08 NOTE — Progress Notes (Signed)
Patient ID: Jeremy [name], male   DOB: April 07, 1925, 81 y.o.   MRN: [id number] [facility] PROGRESS NOTE  Jeremy [name] [id number] DOB: April 07, 1925 DOA: 12/04/2014 PCP: Gwynneth AlimentSANDERS,ROBYN N, MD  Brief narrative:    81 y.o. male with a history of CAD, diastolic CHF ( 2 D ECHO in 01/2014 with EF 55% and grade 1 DD), HTN, Hyperlipidemia, and Dementia who [facility] after he was found to have blood around his foley catheter. In addition, he was found to have vomit while he was at sleep but unclear at which point pt vomited. In ED, CXR demonstrated focal right lower lobe pneumonia in addition to UTI. He was started on vanco and zosyn while awaiting urine culture results.  Change to cipro  Anticipated discharge: SNF 12/09/2014- palliative consult  Assessment/Plan:       HCAP (healthcare-associated pneumonia) / Aspiration pneumonia of right lower lobe due to regurgitated food (HCC) - Pt from SNF - His CXR showed focal right lower lobe pneumonia - Started on vanco and zosyn- will d/c and monitor-no cough - Appreciate SLP evaluation - Stable respiratory status - Continue oxygen support via [place] to keep O2 sats above 90% - no blood cultures done on admission    UTI (lower urinary tract infection)- ? Colonization but had N/V symptoms - UA on admission showed large leukocytes and many bacteria - Urine culture pending- gram neg rods- citrobacter    Essential hypertension - Continue Imdur but monitor BP closely since SBP in 90's after he was given Imdur    Coronary artery disease involving native coronary artery of native heart without angina pectoris / Chronic diastolic CHF - Stable - Compensated - Last 2 DE CHO in 01/2014 with EF of 55% and grade 1 diastolic dysfunction - Continue aspirin and plavix - Continue simvastatin     Hypothyroidism - Continue synthroid     Acute encephalopathy/ Dementia without behavioral disturbance - No significant  changes in mental status - Continue to monitor     CKD (chronic kidney disease) stage 3, GFR 30-59 ml/min - Baseline Cr about 1.4 and on this admission within baseline range     Anemia of chronic renal failure, stage 3 (moderate) - Hemoglobin stable  - No current indications for transfusion       Protein-calorie malnutrition, severe - Seen by dietician - Continue nutritional supplementation     Dyslipidemia - Continue statin therapy     BPH (benign prostatic hyperplasia) - Continue Flomax and Proscar    Depression - Continue Seroquel   DVT Prophylaxis  - Lovenox subQ ordered    -will ask for palliative care  Consult as I suspect as patient's dementia worsens, his hospitalizations will increase-- family ultimately would like to get him either home or memory care unit  Code Status: Full.  Family Communication:  Discussed with the family (daughter). Disposition Plan: to SNF likely by 12/09/2014.  IV access:  Peripheral IV  Procedures and diagnostic studies:    Dg Chest 2 View 12/05/2014  Focal right lower lung field subsegmental atelectasis versus developing pneumonia. Clinical correlation is recommended. Electronically Signed   By: Elgie CollardArash  Radparvar M.D.   On: 12/05/2014 00:55   Medical Consultants:  None   Other Consultants:  PT Nutrition     Amorie Rentz UPCHURCH Benjamine MolaVANN, DO  [facility] Pager [phone]  Time spent in minutes: 25 minutes  If 7PM-7AM, please contact night-coverage [email or web address] Password [email or web address] 12/08/2014, 11:41 AM  LOS: 3 days    HPI/Subjective: Eating well Seeing "black cats"  Objective: Filed Vitals:   12/07/14 2103 12/08/14 0015 12/08/14 0428 12/08/14 1026  BP: 133/81 130/77 111/68 125/62  Pulse: 106 96 90 90  Temp: 98.1 F (36.7 C) 98.8 F (37.1 C) 97.6 F (36.4 C) 98.3 F (36.8 C)  TempSrc: Oral Oral Oral Oral  Resp: Height:      Weight:      SpO2: 100% 100% 100% 99%    Intake/Output Summary (Last 24 hours)  at 12/08/14 1141 Last data filed at 12/08/14 0218  Gross per 24 hour  Intake    720 ml  Output   1925 ml  Net  -1205 ml    Exam:   General:  Pleasant/cooperative  Cardiovascular: Regular rate and rhythm, S1/S2 (+)  Respiratory: Clear to auscultation bilaterally, no wheezing, no crackles, no rhonchi  Abdomen: Soft, non tender, non distended, bowel sounds present  Extremities: No edema, pulses DP and PT palpable bilaterally  Neuro: Grossly nonfocal  Data Reviewed: Basic Metabolic Panel:  Recent Labs Lab 12/05/14 0055 12/05/14 0557  NA 139 139  K 4.3 3.6  CL 109 110  CO2 20* 22  GLUCOSE 111* 110*  BUN 31* 30*  CREATININE 1.45* 1.46*  CALCIUM 8.5* 8.6*   Liver Function Tests:  Recent Labs Lab 12/05/14 0055  AST 29  ALT 20  ALKPHOS 71  BILITOT 1.0  PROT 6.9  ALBUMIN 3.0*   No results for input(s): LIPASE, AMYLASE in the last 168 hours. No results for input(s): AMMONIA in the last 168 hours. CBC:  Recent Labs Lab 12/05/14 0055 12/05/14 0557  WBC 9.9 10.3  NEUTROABS 7.6  --   HGB 11.0* 11.1*  HCT 34.1* 33.9*  MCV 88.8 88.3  PLT 166 211   Cardiac Enzymes: No results for input(s): CKTOTAL, CKMB, CKMBINDEX, TROPONINI in the last 168 hours. BNP: Invalid input(s): POCBNP CBG: No results for input(s): GLUCAP in the last 168 hours.  Recent Results (from the past 240 hour(s))  Urine culture     Status: None   Collection Time: 12/05/14 12:39 AM  Result Value Ref Range Status   Specimen Description URINE, CATHETERIZED  Final   Special Requests NONE  Final   Culture >=100,000 COLONIES/mL CITROBACTER BRAAKII  Final   Report Status 12/08/2014 FINAL  Final   Organism ID, Bacteria CITROBACTER BRAAKII  Final      Susceptibility   Citrobacter braakii - MIC*    CEFAZOLIN >=64 RESISTANT Resistant     CEFTRIAXONE >=64 RESISTANT Resistant     CIPROFLOXACIN <=0.25 SENSITIVE Sensitive     GENTAMICIN <=1 SENSITIVE Sensitive     IMIPENEM <=0.25 SENSITIVE  Sensitive     NITROFURANTOIN <=16 SENSITIVE Sensitive     TRIMETH/SULFA <=20 SENSITIVE Sensitive     PIP/TAZO >=128 RESISTANT Resistant     * >=100,000 COLONIES/mL CITROBACTER BRAAKII  Urine culture     Status: None   Collection Time: 12/05/14  1:45 AM  Result Value Ref Range Status   Specimen Description URINE, CATHETERIZED  Final   Special Requests NONE  Final   Culture >=100,000 COLONIES/mL CITROBACTER BRAAKII  Final   Report Status 12/07/2014 FINAL  Final   Organism ID, Bacteria CITROBACTER BRAAKII  Final      Susceptibility   Citrobacter braakii - MIC*    CEFAZOLIN >=64 RESISTANT Resistant     CEFTRIAXONE >=64 RESISTANT Resistant     CIPROFLOXACIN <=0.25 SENSITIVE Sensitive  GENTAMICIN <=1 SENSITIVE Sensitive     IMIPENEM 1 SENSITIVE Sensitive     NITROFURANTOIN <=16 SENSITIVE Sensitive     TRIMETH/SULFA <=20 SENSITIVE Sensitive     PIP/TAZO 32 INTERMEDIATE Intermediate     * >=100,000 COLONIES/mL CITROBACTER BRAAKII  MRSA PCR Screening     Status: None   Collection Time: 12/07/14 11:25 AM  Result Value Ref Range Status   MRSA by PCR NEGATIVE NEGATIVE Final    Comment:        The GeneXpert MRSA Assay (FDA approved for NASAL specimens only), is one component of a comprehensive MRSA colonization surveillance program. It is not intended to diagnose MRSA infection nor to guide or monitor treatment for MRSA infections.      Scheduled Meds: . aspirin EC  81 mg Oral Daily  . ciprofloxacin  400 mg Intravenous Q24H  . clopidogrel  75 mg Oral Daily  . dorzolamide  1 drop Right Eye BID  . enoxaparin (LOVENOX) injection  40 mg Subcutaneous Q24H  . famotidine  10 mg Oral BID  . feeding supplement (ENSURE ENLIVE)  237 mL Oral BID BM  . finasteride  5 mg Oral QPM  . isosorbide mononitrate  30 mg Oral Daily  . latanoprost  1 drop Both Eyes QHS  . levothyroxine  75 mcg Oral QAC breakfast  . protein supplement  1 scoop Oral BID  . QUEtiapine  25 mg Oral QHS  .  senna-docusate  1 tablet Oral QHS  . simvastatin  40 mg Oral q1800  . tamsulosin  0.4 mg Oral Daily  . timolol  1 drop Both Eyes BID

## 2014-12-08 NOTE — Care Management Important Message (Signed)
Important Message  Patient Details  Name: Jeremy [name] MRN: [id number] Date of Birth: [birth year]   Medicare Important Message Given:  Yes    [name] 12/08/2014, 11:17 AM

## 2014-12-09 LAB — BASIC METABOLIC PANEL
Anion gap: 8 (ref 5–15)
BUN: 14 mg/dL (ref 6–20)
CALCIUM: 8.5 mg/dL — AB (ref 8.9–10.3)
CHLORIDE: 108 mmol/L (ref 101–111)
CO2: 23 mmol/L (ref 22–32)
CREATININE: 0.92 mg/dL (ref 0.61–1.24)
GFR calc Af Amer: >60 mL/min (ref >60)
GFR calc non Af Amer: >60 mL/min (ref >60)
Glucose, Bld: 101 mg/dL — ABNORMAL HIGH (ref 65–99)
Potassium: 3.5 mmol/L (ref 3.5–5.1)
SODIUM: 139 mmol/L (ref 135–145)

## 2014-12-09 LAB — CBC
HCT: 33.3 % — ABNORMAL LOW (ref 39.0–52.0)
HEMOGLOBIN: 10.9 g/dL — AB (ref 13.0–17.0)
MCH: 28.8 pg (ref 26.0–34.0)
MCHC: 32.7 g/dL (ref 30.0–36.0)
MCV: 87.9 fL (ref 78.0–100.0)
Platelets: 185 10*3/uL (ref 150–400)
RBC: 3.79 MIL/uL — ABNORMAL LOW (ref 4.22–5.81)
RDW: 14.8 % (ref 11.5–15.5)
WBC: 8.2 10*3/uL (ref 4.0–10.5)

## 2014-12-09 MED ORDER — POLYETHYLENE GLYCOL 3350 17 G PO PACK
17.0000 g | PACK | Freq: Every day | ORAL | Status: DC
  Filled 2014-12-09 (×3): qty 1

## 2014-12-09 MED ORDER — CIPROFLOXACIN HCL 500 MG PO TABS
500.0000 mg | ORAL_TABLET | Freq: Every day | ORAL | Status: DC
  Administered 2014-12-09 – 2014-12-11 (×3): 500 mg via ORAL
  Filled 2014-12-09 (×3): qty 1

## 2014-12-09 MED ORDER — BISACODYL 10 MG RE SUPP
10.0000 mg | Freq: Once | RECTAL | Status: AC
  Administered 2014-12-09: 10 mg via RECTAL
  Filled 2014-12-09: qty 1

## 2014-12-09 MED ORDER — AMOXICILLIN-POT CLAVULANATE 875-125 MG PO TABS
1.0000 | ORAL_TABLET | Freq: Two times a day (BID) | ORAL | Status: DC
  Administered 2014-12-09 – 2014-12-11 (×5): 1 via ORAL
  Filled 2014-12-09 (×5): qty 1

## 2014-12-09 NOTE — Progress Notes (Signed)
Nutrition Follow-up  DOCUMENTATION CODES:   Severe malnutrition in context of acute illness/injury  INTERVENTION:   -Continue Ensure Enlive po BID, each supplement provides 350 kcal and 20 grams of protein -Continue 1 scoop Resource Beneprotein BID  NUTRITION DIAGNOSIS:   Malnutrition related to acute illness as evidenced by moderate depletion of body fat, moderate depletions of muscle mass.  Ongoing  GOAL:   Patient will meet greater than or equal to 90% of their needs  Progressing  MONITOR:   PO intake, Supplement acceptance, Weight trends, Labs, Skin, I & O's  REASON FOR ASSESSMENT:   Malnutrition Screening Tool    ASSESSMENT:   Jeremy [name] is a 81 y.o. male with a history of CAD, CHF, HTN, Hyperlipidemia, and Dementia who was brought from the [facility] after he was found to have blood around his foley catheter , and staff had noticed that he was having increased vomiting, and was vomiting in his sleep. Family reports that he had increased coughing spells after the vomiting episodes, and were concerned that he may have aspirated. He was seen in the ED and found to have a RLL infiltrate on Chest X-ray and evidence of a UTI and was placed on antibiotic Rx of IV Vancomycin and Zosyn and refereed for admission.   Pt and family in with palliative care team discussing GOC at time of visit.   SLP completed BSE on 12/07/14; study revealed no signs or symptoms of aspiration and recommended continue with regular textured diet with thin liquids. Pt consuming 40-90% of meals on a Heart Healthy diet.   Per MAR, pt with fair intake of supplements (Beneprotein and Ensure); about 50% acceptance. Per initial visit, family requested that pt stay on supplement regimen PTA for consistency.   Labs reviewed.   Diet Order:  Diet Heart Room service appropriate?: Yes; Fluid consistency:: Thin  Skin:  Reviewed, no issues  Last BM:  12/07/14  Height:   Ht  Readings from Last 1 Encounters:  12/04/14 5\' 11"  (1.803 m)    Weight:   Wt Readings from Last 1 Encounters:  12/04/14 150 lb (68.04 kg)    Ideal Body Weight:  78.2 kg  BMI:  Body mass index is 20.93 kg/(m^2).  Estimated Nutritional Needs:   Kcal:  1800-2000  Protein:  85-100 grams  Fluid:  1.8-2.0 L  EDUCATION NEEDS:   Education needs addressed  Gio Janoski A. Mayford KnifeWilliams, RD, LDN, CDE Pager: [phone] After hours Pager: [phone]

## 2014-12-09 NOTE — Progress Notes (Signed)
Patient ID: Jeremy [name], male   DOB: 26-Oct-1925, 81 y.o.   MRN: [id number] [facility] PROGRESS NOTE  Jeremy [name] WUJ:[id number] DOB: 26-Oct-1925 DOA: 12/04/2014 PCP: Gwynneth AlimentSANDERS,ROBYN N, MD  Brief narrative:    81 y.o. male with a history of CAD, diastolic CHF ( 2 D ECHO in 01/2014 with EF 55% and grade 1 DD), HTN, Hyperlipidemia, and Dementia who presented to [facility] ED from the [facility] after he was found to have blood around his foley catheter. In addition, he was found to have vomit while he was at sleep but unclear at which point pt vomited. In ED, CXR demonstrated focal right lower lobe pneumonia in addition to UTI. He was started on vanco and zosyn while awaiting urine culture results.  Change to cipro   Assessment/Plan:       HCAP (healthcare-associated pneumonia) / Aspiration pneumonia of right lower lobe due to regurgitated food (HCC) - Pt from SNF - His CXR from 12/05/2014 showed focal right lower lobe pneumonia - He was transitioned to Augmentin 875 one tablet by mouth twice a day on 12/09/2014 - Continue oxygen support via [place] to keep O2 sats above 90% - Blood culture drawn on 11/07/2014 showing no growth to date    UTI (lower urinary tract infection)- ? Colonization but had N/V symptoms - UA on admission showed large leukocytes and many bacteria - Urine culture pending- gram neg rods- citrobacter -Organism having resistance to multiple antimicrobials including beta lactams, will treat with ciprofloxacin    Essential hypertension - Continue Imdur but monitor BP closely since SBP in 90's after he was given Imdur    Coronary artery disease involving native coronary artery of native heart without angina pectoris / Chronic diastolic CHF - Stable - Compensated - Last 2 DE CHO in 01/2014 with EF of 55% and grade 1 diastolic dysfunction - Continue aspirin and plavix - Continue simvastatin     Hypothyroidism - Continue synthroid     Acute  encephalopathy/ Dementia without behavioral disturbance - No significant changes in mental status - Continue to monitor     CKD (chronic kidney disease) stage 3, GFR 30-59 ml/min - Baseline Cr about 1.4 and on this admission within baseline range     Anemia of chronic renal failure, stage 3 (moderate) - Hemoglobin stable  - No current indications for transfusion       Protein-calorie malnutrition, severe - Seen by dietician - Continue nutritional supplementation     Dyslipidemia - Continue statin therapy     BPH (benign prostatic hyperplasia) - Continue Flomax and Proscar    Depression - Continue Seroquel   DVT Prophylaxis  - Lovenox subQ ordered   Goals of care -I spoke to patient's daughter today regarding Jeremy [name] overall medical goals of care. She would prefer for her father to be home with family members caring for him. She expresses concerns about what this would entail and what preparations they would need. She mentioned having aid to care for him during the day and family members at nighttime. We discussed philosophy of palliative care. She would like to arrange a meeting between her siblings and palliative care to discuss further Jeremy [name] goals.  Code Status: Full.  Family Communication:  Discussed with the family (daughter). Disposition Plan: to SNF likely by 12/09/2014.  IV access:  Peripheral IV  Procedures and diagnostic studies:    Dg Chest 2 View 12/05/2014  Focal right lower lung field subsegmental atelectasis versus developing pneumonia.  Clinical correlation is recommended. Electronically Signed   By: Elgie Collard M.D.   On: 12/05/2014 00:55   Medical Consultants:  None   Other Consultants:  PT Nutrition     Jeralyn Bennett, DO  [facility] Pager [phone]  Time spent in minutes: 25 minutes  If 7PM-7AM, please contact night-coverage [email or web address] Password [email or web address] 12/09/2014, 1:01 PM   LOS: 4 days    HPI/Subjective: Eating  well Seeing "black cats"  Objective: Filed Vitals:   12/08/14 2105 12/08/14 2226 12/09/14 0607 12/09/14 1045  BP: 165/89 150/88 145/96 146/90  Pulse: 97 92 83 89  Temp: 98.9 F (37.2 C)  98.3 F (36.8 C)   TempSrc: Oral  Oral   Resp: 17  19   Height:      Weight:      SpO2: 100% 100% 100%     Intake/Output Summary (Last 24 hours) at 12/09/14 1301 Last data filed at 12/09/14 4540  Gross per 24 hour  Intake    200 ml  Output   1800 ml  Net  -1600 ml    Exam:   General:  Pleasant/cooperative, total assist getting him out of bed to chair.  Cardiovascular: Regular rate and rhythm, S1/S2 (+)  Respiratory: Clear to auscultation bilaterally, no wheezing, no crackles, no rhonchi  Abdomen: Soft, non tender, non distended, bowel sounds present  Extremities: No edema, pulses DP and PT palpable bilaterally  Neuro: Grossly nonfocal  Data Reviewed: Basic Metabolic Panel:  Recent Labs Lab 12/05/14 0055 12/05/14 0557 12/09/14 0035  NA 139 139 139  K 4.3 3.6 3.5  CL 109 110 108  CO2 20* 22 23  GLUCOSE 111* 110* 101*  BUN 31* 30* 14  CREATININE 1.45* 1.46* 0.92  CALCIUM 8.5* 8.6* 8.5*   Liver Function Tests:  Recent Labs Lab 12/05/14 0055  AST 29  ALT 20  ALKPHOS 71  BILITOT 1.0  PROT 6.9  ALBUMIN 3.0*   No results for input(s): LIPASE, AMYLASE in the last 168 hours. No results for input(s): AMMONIA in the last 168 hours. CBC:  Recent Labs Lab 12/05/14 0055 12/05/14 0557 12/09/14 0035  WBC 9.9 10.3 8.2  NEUTROABS 7.6  --   --   HGB 11.0* 11.1* 10.9*  HCT 34.1* 33.9* 33.3*  MCV 88.8 88.3 87.9  PLT 166 211 185   Cardiac Enzymes: No results for input(s): CKTOTAL, CKMB, CKMBINDEX, TROPONINI in the last 168 hours. BNP: Invalid input(s): POCBNP CBG: No results for input(s): GLUCAP in the last 168 hours.  Recent Results (from the past 240 hour(s))  Urine culture     Status: None   Collection Time: 12/05/14 12:39 AM  Result Value Ref Range Status    Specimen Description URINE, CATHETERIZED  Final   Special Requests NONE  Final   Culture >=100,000 COLONIES/mL CITROBACTER BRAAKII  Final   Report Status 12/08/2014 FINAL  Final   Organism ID, Bacteria CITROBACTER BRAAKII  Final      Susceptibility   Citrobacter braakii - MIC*    CEFAZOLIN >=64 RESISTANT Resistant     CEFTRIAXONE >=64 RESISTANT Resistant     CIPROFLOXACIN <=0.25 SENSITIVE Sensitive     GENTAMICIN <=1 SENSITIVE Sensitive     IMIPENEM <=0.25 SENSITIVE Sensitive     NITROFURANTOIN <=16 SENSITIVE Sensitive     TRIMETH/SULFA <=20 SENSITIVE Sensitive     PIP/TAZO >=128 RESISTANT Resistant     * >=100,000 COLONIES/mL CITROBACTER BRAAKII  Urine culture     Status: None   Collection  Time: 12/05/14  1:45 AM  Result Value Ref Range Status   Specimen Description URINE, CATHETERIZED  Final   Special Requests NONE  Final   Culture >=100,000 COLONIES/mL CITROBACTER BRAAKII  Final   Report Status 12/07/2014 FINAL  Final   Organism ID, Bacteria CITROBACTER BRAAKII  Final      Susceptibility   Citrobacter braakii - MIC*    CEFAZOLIN >=64 RESISTANT Resistant     CEFTRIAXONE >=64 RESISTANT Resistant     CIPROFLOXACIN <=0.25 SENSITIVE Sensitive     GENTAMICIN <=1 SENSITIVE Sensitive     IMIPENEM 1 SENSITIVE Sensitive     NITROFURANTOIN <=16 SENSITIVE Sensitive     TRIMETH/SULFA <=20 SENSITIVE Sensitive     PIP/TAZO 32 INTERMEDIATE Intermediate     * >=100,000 COLONIES/mL CITROBACTER BRAAKII  MRSA PCR Screening     Status: None   Collection Time: 12/07/14 11:25 AM  Result Value Ref Range Status   MRSA by PCR NEGATIVE NEGATIVE Final    Comment:        The GeneXpert MRSA Assay (FDA approved for NASAL specimens only), is one component of a comprehensive MRSA colonization surveillance program. It is not intended to diagnose MRSA infection nor to guide or monitor treatment for MRSA infections.      Scheduled Meds: . aspirin EC  81 mg Oral Daily  . bisacodyl  10 mg  Rectal Once  . ciprofloxacin  500 mg Oral Q breakfast  . clopidogrel  75 mg Oral Daily  . dorzolamide  1 drop Right Eye BID  . enoxaparin (LOVENOX) injection  40 mg Subcutaneous Q24H  . famotidine  10 mg Oral BID  . feeding supplement (ENSURE ENLIVE)  237 mL Oral BID BM  . finasteride  5 mg Oral QPM  . isosorbide mononitrate  30 mg Oral Daily  . latanoprost  1 drop Both Eyes QHS  . levothyroxine  75 mcg Oral QAC breakfast  . polyethylene glycol  17 g Oral Daily  . protein supplement  1 scoop Oral BID  . QUEtiapine  25 mg Oral QHS  . senna-docusate  1 tablet Oral QHS  . simvastatin  40 mg Oral q1800  . tamsulosin  0.4 mg Oral Daily  . timolol  1 drop Both Eyes BID

## 2014-12-09 NOTE — Care Management Note (Addendum)
Case Management Note  Patient Details  Name: Pershing CoxJames T Veals MRN: [id number] Date of Birth: [birth year]  Subjective/Objective:                 CM informed in progression this morning that patient's family now wishes to take him home. Per Attending MD Palliative is to meet with patient today at 3:00. Upon speaking with daughter Liborio NixonJanice at the bedside, palliative meeting will occur tomorrow at 12:30. Daughter states that patient will return home to [address], [place], [place] [address]. This is a one story home with bathroom on the first level.  She states that she plans on having family and private duty aids supply the 24/7 support that patient will require. Patien is visually impaired, has dementia and foley.CM discussed DME needs with daughter and patient will require hospital bed, to prevent aspiration and assist in repositioning, BSC, and walker/or wheelchair.  Family provided private duty list, hospice provider list and CAPS application if patient is approved for medicaid.    Action/Plan:   Expected Discharge Date:                  Expected Discharge Plan:     In-House Referral:  Clinical Social Work  Discharge planning Services  CM Consult  Post Acute Care Choice:    Choice offered to:     DME Arranged:    DME Agency:     HH Arranged:    HH Agency:     Status of Service:  In process, will continue to follow  Medicare Important Message Given:  Yes Date Medicare IM Given:    Medicare IM give by:    Date Additional Medicare IM Given:    Additional Medicare Important Message give by:     If discussed at Long Length of Stay Meetings, dates discussed:    Additional Comments:  Lawerance SabalDebbie Aymee Fomby, RN 12/09/2014, 12:26 PM

## 2014-12-10 DIAGNOSIS — Z515 Encounter for palliative care: Secondary | ICD-10-CM

## 2014-12-10 NOTE — Consult Note (Signed)
Consultation Note Date: 12/10/2014   Patient Name: Jeremy [name]  DOB: April 12, 1925  MRN: [id number]  Age / Sex: 81 y.o., male  PCP: Dorothyann Peng, MD Referring Physician: Jeralyn Bennett, MD  Reason for Consultation: Establishing goals of care    Clinical Assessment/Narrative:  81 y.o. male with a history of CAD, diastolic CHF ( 2 D ECHO in 01/2014 with EF 55% and grade 1 DD), HTN, Hyperlipidemia, and Dementia who presented to [facility] ED from the [facility] with increased weakness.   In ED, CXR demonstrated focal right lower lobe pneumonia in addition to UTI.   Family reports continued cognitive decline since October 2016 and many "up and downs" with physical and functional status.  Family now faced with ad  Consult is for review of medical treatment options, clarification of goals of care and end of life issues, disposition and options, and symptom recommendation.  This NP Lorinda Creed reviewed medical records, received report from team, assessed the patient and then meet at the patient's bedside along with his daughter Kenna Gilbert, and by telephone Peggyann Juba and grand-daughter Clearence Ped. to discuss diagnosis prognosis, GOC, EOL wishes disposition and options.   A detailed discussion was had today regarding advanced directives.  Concepts specific to code status, artifical feeding and hydration, continued IV antibiotics and rehospitalization was had.  The difference between a aggressive medical intervention path  and a palliative comfort care path for this patient at this time was had.  Values and goals of care important to patient and family were attempted to be elicited.  Concept of Hospice and Palliative Care were discussed   MOST form introduced  Discussed home health services  Natural trajectory and expectations at EOL were discussed.  Questions and concerns addressed.  Hard Choices  booklet left for review. Family encouraged to call with questions or concerns.  PMT will continue to support holistically.   HCPOA: yes    SUMMARY OF RECOMMENDATIONS  -  Family continues to be hopeful for improvement however they understand the long term poor prognosis -hope is for hospice services in the home, family has access to 24/7 caregivers, will write for choice   Code Status/Advance Care Planning:   Full code -Family strongly encouraged to consider DNR/DNI status understanding poor outcomes in similar patients        Code Status Orders        Start     Ordered   12/05/14 0405  Full code   Continuous     12/05/14 0404      Other Directives:   Palliative Prophylaxis:    Aspiration, Bowel Regimen, Frequent Pain Assessment, Oral Care and Palliative Wound Care    Psycho-social/Spiritual:  Support System: Strong Desire for further Chaplaincy support:no Additional Recommendations: Education on Hospice  Prognosis:  Less than 6 months   Discharge Planning: Home with Hospice   Family will need continued support and assistance/discussion regarding de-escalation of care as patient continues to fail to thrive.   Chief Complaint/ Primary Diagnoses: Present on Admission:  . HCAP (healthcare-associated pneumonia) . UTI (lower urinary tract infection) . Essential hypertension . Hypothyroidism . Dementia without behavioral disturbance . Protein-calorie malnutrition, severe . Aspiration pneumonia of right lower lobe due to regurgitated food (HCC) . Acute encephalopathy . CKD (chronic kidney disease) stage 3, GFR 30-59 ml/min . Anemia of chronic renal failure, stage 3 (moderate) . Dyslipidemia . Coronary artery disease involving native coronary artery of native heart without angina pectoris . BPH (benign prostatic  hyperplasia) . Chronic diastolic congestive heart failure (HCC) . Depression  I have reviewed the medical record, interviewed the patient and  family, and examined the patient. The following aspects are pertinent.  History reviewed. No pertinent past medical history. Social History   Social History  . Marital Status: Married    Spouse Name: N/A  . Number of Children: 4  . Years of Education: HS   Occupational History  . Retired    Social History Main Topics  . Smoking status: Former Games developer  . Smokeless tobacco: Never Used     Comment: Quit 50+ years ago.  . Alcohol Use: No  . Drug Use: No  . Sexual Activity: Not Asked   Other Topics Concern  . None   Social History Narrative   Lives with his wife.   Right-handed.   No caffeine use.   Family History  Problem Relation Age of Onset  . Family history unknown: Yes   Scheduled Meds: . amoxicillin-clavulanate  1 tablet Oral Q12H  . aspirin EC  81 mg Oral Daily  . ciprofloxacin  500 mg Oral Q breakfast  . clopidogrel  75 mg Oral Daily  . dorzolamide  1 drop Right Eye BID  . enoxaparin (LOVENOX) injection  40 mg Subcutaneous Q24H  . famotidine  10 mg Oral BID  . feeding supplement (ENSURE ENLIVE)  237 mL Oral BID BM  . finasteride  5 mg Oral QPM  . isosorbide mononitrate  30 mg Oral Daily  . latanoprost  1 drop Both Eyes QHS  . levothyroxine  75 mcg Oral QAC breakfast  . polyethylene glycol  17 g Oral Daily  . protein supplement  1 scoop Oral BID  . QUEtiapine  25 mg Oral QHS  . senna-docusate  1 tablet Oral QHS  . simvastatin  40 mg Oral q1800  . tamsulosin  0.4 mg Oral Daily  . timolol  1 drop Both Eyes BID   Continuous Infusions:  PRN Meds:.acetaminophen **OR** acetaminophen, alum & mag hydroxide-simeth, HYDROmorphone (DILAUDID) injection, ondansetron **OR** ondansetron (ZOFRAN) IV, oxyCODONE Medications Prior to Admission:  Prior to Admission medications   Medication Sig Start Date End Date Taking? Authorizing Provider  aspirin EC 81 MG tablet Take 81 mg by mouth daily.   Yes Historical Provider, MD  clopidogrel (PLAVIX) 75 MG tablet Take 1 tablet (75  mg total) by mouth daily. 10/30/14  Yes Runell Gess, MD  dorzolamide (TRUSOPT) 2 % ophthalmic solution Place 1 drop into the right eye 2 (two) times daily.  08/23/12  Yes Historical Provider, MD  finasteride (PROSCAR) 5 MG tablet Take 1 tablet (5 mg total) by mouth daily. Patient taking differently: Take 5 mg by mouth every evening.  10/13/14  Yes Albertine Grates, MD  guaiFENesin-dextromethorphan (ROBITUSSIN DM) 100-10 MG/5ML syrup Take 5 mLs by mouth every 4 (four) hours as needed for cough. 11/11/14  Yes Richarda Overlie, MD  isosorbide mononitrate (IMDUR) 30 MG 24 hr tablet Take 1 tablet (30 mg total) by mouth daily. 10/30/14  Yes Runell Gess, MD  latanoprost (XALATAN) 0.005 % ophthalmic solution Place 1 drop into both eyes at bedtime.   Yes Historical Provider, MD  levothyroxine (SYNTHROID, LEVOTHROID) 75 MCG tablet Take 75 mcg by mouth daily. 11/06/14  Yes Historical Provider, MD  losartan (COZAAR) 50 MG tablet Take 50 mg by mouth daily.  10/08/12  Yes Historical Provider, MD  metoprolol succinate (TOPROL-XL) 25 MG 24 hr tablet Take 25 mg by mouth daily.  Yes Historical Provider, MD  QUEtiapine (SEROQUEL) 25 MG tablet Take 25 mg by mouth at bedtime. For behaviors   Yes Historical Provider, MD  ranitidine (ZANTAC) 75 MG tablet Take 75 mg by mouth daily.   Yes Historical Provider, MD  simvastatin (ZOCOR) 40 MG tablet Take 40 mg by mouth daily at 6 PM.    Yes Historical Provider, MD  tamsulosin (FLOMAX) 0.4 MG CAPS capsule Take 0.4 mg by mouth daily. 10/13/14  Yes Historical Provider, MD  timolol (TIMOPTIC) 0.5 % ophthalmic solution Place 1 drop into both eyes 2 (two) times daily.  09/18/12  Yes Historical Provider, MD   Allergies  Allergen Reactions  . Tramadol Nausea And Vomiting    Review of Systems  Unable to perform ROS   Physical Exam  Constitutional: He appears lethargic. He appears cachectic.  Respiratory: He has decreased breath sounds in the right lower field and the left lower  field.  Musculoskeletal:       Right shoulder: He exhibits decreased strength.  Neurological: He appears lethargic.  Skin: Skin is warm and dry.    Vital Signs: BP 132/86 mmHg  Pulse 90  Temp(Src) 99.6 F (37.6 C) (Oral)  Resp 18  Ht 5\' 11"  (1.803 m)  Wt 68.04 kg (150 lb)  BMI 20.93 kg/m2  SpO2 100%  SpO2: SpO2: 100 % O2 Device:SpO2: 100 % O2 Flow Rate: .O2 Flow Rate (L/min): 2 L/min  IO: Intake/output summary:  Intake/Output Summary (Last 24 hours) at 12/10/14 1153 Last data filed at 12/09/14 1718  Gross per 24 hour  Intake    340 ml  Output    400 ml  Net    -60 ml    LBM: Last BM Date: 12/09/14 Baseline Weight: Weight: 68.04 kg (150 lb) Most recent weight: Weight: 68.04 kg (150 lb)      Palliative Assessment/Data:  Flowsheet Rows        Most Recent Value   Intake Tab    Referral Department  Hospitalist   Unit at Time of Referral  Med/Surg Unit   Palliative Care Primary Diagnosis  Cardiac   Date Notified  12/08/14   Palliative Care Type  New Palliative care   Reason for referral  Clarify Goals of Care   Date of Admission  12/04/14   Date first seen by Palliative Care  12/10/14   # of days Palliative referral response time  2 Day(s)   # of days IP prior to Palliative referral  4   Clinical Assessment    Psychosocial & Spiritual Assessment    Palliative Care Outcomes       Additional Data Reviewed:  CBC:    Component Value Date/Time   WBC 8.2 12/09/2014 0035   WBC 7.6 11/12/2014   HGB 10.9* 12/09/2014 0035   HCT 33.3* 12/09/2014 0035   PLT 185 12/09/2014 0035   MCV 87.9 12/09/2014 0035   NEUTROABS 7.6 12/05/2014 0055   LYMPHSABS 1.4 12/05/2014 0055   MONOABS 0.8 12/05/2014 0055   EOSABS 0.1 12/05/2014 0055   BASOSABS 0.0 12/05/2014 0055   Comprehensive Metabolic Panel:    Component Value Date/Time   NA 139 12/09/2014 0035   NA 141 11/13/2014   K 3.5 12/09/2014 0035   CL 108 12/09/2014 0035   CO2 23 12/09/2014 0035   BUN 14 12/09/2014 0035    BUN 19 11/13/2014   CREATININE 0.92 12/09/2014 0035   CREATININE 0.9 11/13/2014   GLUCOSE 101* 12/09/2014 0035   CALCIUM 8.5*  12/09/2014 0035   AST 29 12/05/2014 0055   ALT 20 12/05/2014 0055   ALKPHOS 71 12/05/2014 0055   BILITOT 1.0 12/05/2014 0055   PROT 6.9 12/05/2014 0055   ALBUMIN 3.0* 12/05/2014 0055     Time In: 1300 Time Out: 1415 Time Total: 75 min Greater than 50%  of this time was spent counseling and coordinating care related to the above assessment and plan.  Signed by: Lorinda CreedLARACH, MARY, NP  Canary BrimMary W Larach, NP  12/10/2014, 11:53 AM  Please contact Palliative Medicine Team phone at [phone] for questions and concerns.

## 2014-12-10 NOTE — Progress Notes (Signed)
Patient ID: Jeremy [name], male   DOB: 29-Aug-1925, 81 y.o.   MRN: [id number] [facility] PROGRESS NOTE  WILLIOM CEDAR [name]:[id number] DOB: Sep 04, 1925 DOA: 12/04/2014 PCP: [name], MD  Brief narrative:    81 y.o. male with a history of CAD, diastolic CHF ( 2 D ECHO in 01/2014 with EF 55% and grade 1 DD), HTN, Hyperlipidemia, and Dementia who presented to [facility] ED from the [facility] after he was found to have blood around his foley catheter. In addition, he was found to have vomit while he was at sleep but unclear at which point pt vomited. In ED, CXR demonstrated focal right lower lobe pneumonia in addition to UTI. He was started on vanco and zosyn while awaiting urine culture results.  Change to cipro   Assessment/Plan:       HCAP (healthcare-associated pneumonia) / Aspiration pneumonia of right lower lobe due to regurgitated food (HCC) - Pt from SNF - His CXR from 12/05/2014 showed focal right lower lobe pneumonia - He was transitioned to Augmentin 875 one tablet by mouth twice a day on 12/09/2014 - Continue oxygen support via [place] to keep O2 sats above 90% - Blood culture drawn on 11/07/2014 showing no growth to date    UTI (lower urinary tract infection)- ? Colonization but had N/V symptoms - UA on admission showed large leukocytes and many bacteria - Urine culture pending- gram neg rods- citrobacter -Organism having resistance to multiple antimicrobials including beta lactams, will treat with ciprofloxacin    Essential hypertension - Continue Imdur but monitor BP closely since SBP in 90's after he was given Imdur    Coronary artery disease involving native coronary artery of native heart without angina pectoris / Chronic diastolic CHF - Stable - Compensated - Last 2 DE CHO in 01/2014 with EF of 55% and grade 1 diastolic dysfunction - Continue aspirin and plavix - Continue simvastatin     Hypothyroidism - Continue synthroid     Acute  encephalopathy/ Dementia without behavioral disturbance - No significant changes in mental status - Continue to monitor     CKD (chronic kidney disease) stage 3, GFR 30-59 ml/min - Baseline Cr about 1.4 and on this admission within baseline range     Anemia of chronic renal failure, stage 3 (moderate) - Hemoglobin stable  - No current indications for transfusion       Protein-calorie malnutrition, severe - Seen by dietician - Continue nutritional supplementation     Dyslipidemia - Continue statin therapy     BPH (benign prostatic hyperplasia) - Continue Flomax and Proscar    Depression - Continue Seroquel   DVT Prophylaxis  - Lovenox subQ ordered   Goals of care -I spoke to patient's daughter today regarding Jeremy [name] overall medical goals of care. She would prefer for her father to be home with family members caring for him. She expresses concerns about what this would entail and what preparations they would need. She mentioned having aid to care for him during the day and family members at nighttime. We discussed philosophy of palliative care. She would like to arrange a meeting between her siblings and palliative care to discuss further [name] goals.  -On 12/10/2014 family meeting held with palliative care. Family members reporting that patient would prefer to go home to spend time with them rather than to be discharged to skilled nursing facility. They have elected to take Jeremy [name] home with home hospice services providing care. Family members requesting  that patient be discharged tomorrow in order for them to prepare their home and to allow for hospital bed to arrive.    Code Status: Full.  Family Communication:  Discussed with the family (daughter). Disposition Plan: Plan for patient to be discharged home in a.m. with home hospice services. Family members wishing to have hospital bed delivered at home as well as given the chance to make several preparations prior to his  discharge.  IV access:  Peripheral IV  Procedures and diagnostic studies:    Dg Chest 2 View 12/05/2014  Focal right lower lung field subsegmental atelectasis versus developing pneumonia. Clinical correlation is recommended. Electronically Signed   By: Elgie Collard M.D.   On: 12/05/2014 00:55   Medical Consultants:  None   Other Consultants:  PT Nutrition     Jeralyn Bennett, DO  [facility] Pager [phone]  Time spent in minutes: 25 minutes  If 7PM-7AM, please contact night-coverage [email or web address] Password [facility] 12/10/2014, 5:39 PM   LOS: 5 days    HPI/Subjective: He is in no acute distress, confused, disoriented pleasant cooperative.  Objective: Filed Vitals:   12/09/14 2120 12/10/14 0228 12/10/14 0516 12/10/14 1529  BP:  141/107 132/86 99/67  Pulse: 111 97 90 95  Temp:  99.4 F (37.4 C) 99.6 F (37.6 C) 98 F (36.7 C)  TempSrc:  Oral Oral Oral  Resp:  Height:      Weight:      SpO2: 100% 97% 100% 100%   No intake or output data in the 24 hours ending 12/10/14 1739  Exam:   General:  Pleasant/cooperative. Sitting at bedside chair.  Cardiovascular: Regular rate and rhythm, S1/S2 (+)  Respiratory: Clear to auscultation bilaterally, no wheezing, no crackles, no rhonchi  Abdomen: Soft, non tender, non distended, bowel sounds present  Extremities: No edema, pulses DP and PT palpable bilaterally  Neuro: Grossly nonfocal  Data Reviewed: Basic Metabolic Panel:  Recent Labs Lab 12/05/14 0055 12/05/14 0557 12/09/14 0035  NA 139 139 139  K 4.3 3.6 3.5  CL 109 110 108  CO2 20* 22 23  GLUCOSE 111* 110* 101*  BUN 31* 30* 14  CREATININE 1.45* 1.46* 0.92  CALCIUM 8.5* 8.6* 8.5*   Liver Function Tests:  Recent Labs Lab 12/05/14 0055  AST 29  ALT 20  ALKPHOS 71  BILITOT 1.0  PROT 6.9  ALBUMIN 3.0*   No results for input(s): LIPASE, AMYLASE in the last 168 hours. No results for input(s): AMMONIA in the last 168  hours. CBC:  Recent Labs Lab 12/05/14 0055 12/05/14 0557 12/09/14 0035  WBC 9.9 10.3 8.2  NEUTROABS 7.6  --   --   HGB 11.0* 11.1* 10.9*  HCT 34.1* 33.9* 33.3*  MCV 88.8 88.3 87.9  PLT 166 211 185   Cardiac Enzymes: No results for input(s): CKTOTAL, CKMB, CKMBINDEX, TROPONINI in the last 168 hours. BNP: Invalid input(s): POCBNP CBG: No results for input(s): GLUCAP in the last 168 hours.  Recent Results (from the past 240 hour(s))  Urine culture     Status: None   Collection Time: 12/05/14 12:39 AM  Result Value Ref Range Status   Specimen Description URINE, CATHETERIZED  Final   Special Requests NONE  Final   Culture >=100,000 COLONIES/mL CITROBACTER BRAAKII  Final   Report Status 12/08/2014 FINAL  Final   Organism ID, Bacteria CITROBACTER BRAAKII  Final      Susceptibility   Citrobacter braakii - MIC*    CEFAZOLIN >=  64 RESISTANT Resistant     CEFTRIAXONE >=64 RESISTANT Resistant     CIPROFLOXACIN <=0.25 SENSITIVE Sensitive     GENTAMICIN <=1 SENSITIVE Sensitive     IMIPENEM <=0.25 SENSITIVE Sensitive     NITROFURANTOIN <=16 SENSITIVE Sensitive     TRIMETH/SULFA <=20 SENSITIVE Sensitive     PIP/TAZO >=128 RESISTANT Resistant     * >=100,000 COLONIES/mL CITROBACTER BRAAKII  Urine culture     Status: None   Collection Time: 12/05/14  1:45 AM  Result Value Ref Range Status   Specimen Description URINE, CATHETERIZED  Final   Special Requests NONE  Final   Culture >=100,000 COLONIES/mL CITROBACTER BRAAKII  Final   Report Status 12/07/2014 FINAL  Final   Organism ID, Bacteria CITROBACTER BRAAKII  Final      Susceptibility   Citrobacter braakii - MIC*    CEFAZOLIN >=64 RESISTANT Resistant     CEFTRIAXONE >=64 RESISTANT Resistant     CIPROFLOXACIN <=0.25 SENSITIVE Sensitive     GENTAMICIN <=1 SENSITIVE Sensitive     IMIPENEM 1 SENSITIVE Sensitive     NITROFURANTOIN <=16 SENSITIVE Sensitive     TRIMETH/SULFA <=20 SENSITIVE Sensitive     PIP/TAZO 32 INTERMEDIATE  Intermediate     * >=100,000 COLONIES/mL CITROBACTER BRAAKII  MRSA PCR Screening     Status: None   Collection Time: 12/07/14 11:25 AM  Result Value Ref Range Status   MRSA by PCR NEGATIVE NEGATIVE Final    Comment:        The GeneXpert MRSA Assay (FDA approved for NASAL specimens only), is one component of a comprehensive MRSA colonization surveillance program. It is not intended to diagnose MRSA infection nor to guide or monitor treatment for MRSA infections.      Scheduled Meds: . amoxicillin-clavulanate  1 tablet Oral Q12H  . aspirin EC  81 mg Oral Daily  . ciprofloxacin  500 mg Oral Q breakfast  . clopidogrel  75 mg Oral Daily  . dorzolamide  1 drop Right Eye BID  . enoxaparin (LOVENOX) injection  40 mg Subcutaneous Q24H  . famotidine  10 mg Oral BID  . feeding supplement (ENSURE ENLIVE)  237 mL Oral BID BM  . finasteride  5 mg Oral QPM  . isosorbide mononitrate  30 mg Oral Daily  . latanoprost  1 drop Both Eyes QHS  . levothyroxine  75 mcg Oral QAC breakfast  . polyethylene glycol  17 g Oral Daily  . protein supplement  1 scoop Oral BID  . QUEtiapine  25 mg Oral QHS  . senna-docusate  1 tablet Oral QHS  . simvastatin  40 mg Oral q1800  . tamsulosin  0.4 mg Oral Daily  . timolol  1 drop Both Eyes BID

## 2014-12-10 NOTE — Care Management Note (Signed)
Case Management Note  Patient Details  Name: Jeremy [name] MRN: [id number] Date of Birth: 1925-02-12  Subjective/Objective:               12-09-14  CM informed in progression this morning that patient's family now wishes to take him home. Per Attending MD Palliative is to meet with patient today at 3:00. Upon speaking with daughter Jeremy [name] at the bedside, palliative meeting will occur tomorrow at 12:30. Daughter states that patient will return home to [address] [place], [place] [address]. This is a one story home with bathroom on the first level. She states that she plans on having family and private duty aids supply the 24/7 support that patient will require. Patien is visually impaired, has dementia and foley.CM discussed DME needs with daughter and patient will require hospital bed, to prevent aspiration and assist in repositioning, BSC, and walker/or wheelchair. Family provided private duty list, hospice provider list and CAPS application if patient is approved for medicaid.      Action/Plan:  12-10-14 Patient's family met and hospice NP [name] Family chose [facility], referral made and forms faxed and also printed out and placed on chart for Jeremy [name] who will see patient and family today around 3:30. Per Jeremy [name], arrangements can be made today for discharge, however family requesting from hospice and attending to be discharged tomorrow to facilitate family needs. Per attending family patient will DC tomorrow to home hospice. No further CM needs.   Expected Discharge Date:                  Expected Discharge Plan:     In-House Referral:  Clinical Social Work  Discharge planning Services  CM Consult  Post Acute Care Choice:    Choice offered to:     DME Arranged:    DME Agency:     HH Arranged:    HH Agency:     Status of Service:  In process, will continue to follow  Medicare Important Message Given:  Yes Date Medicare IM Given:    Medicare IM give by:    Date  Additional Medicare IM Given:    Additional Medicare Important Message give by:     If discussed at [place] of Stay Meetings, dates discussed:    Additional Comments:  Jeremy Collet, RN 12/10/2014, 2:02 PM

## 2014-12-11 MED ORDER — OXYCODONE HCL 5 MG PO TABS: 5.0000 mg | ORAL_TABLET | Freq: Four times a day (QID) | ORAL | Status: DC | PRN

## 2014-12-11 MED ORDER — CIPROFLOXACIN HCL 500 MG PO TABS: 500.0000 mg | ORAL_TABLET | Freq: Every day | ORAL | Status: DC

## 2014-12-11 MED ORDER — AMOXICILLIN-POT CLAVULANATE 875-125 MG PO TABS: 1.0000 | ORAL_TABLET | Freq: Two times a day (BID) | ORAL | Status: DC

## 2014-12-11 NOTE — Care Management Important Message (Signed)
Important Message  Patient Details  Name: Jeremy [name] MRN: [id number] Date of Birth: 04-01-1925   Medicare Important Message Given:  Yes    Mardene SayerGreenlee, Jeremy [name] 12/11/2014, 4:42 PM

## 2014-12-11 NOTE — Progress Notes (Signed)
Patient will DC to: Pt's home Anticipated DC date: 12/11/14 Family notified: N/A Transport by: [facility]  CSW signing off.  Annica Marinello, LCSWA Clinical Social Worker [phone] 

## 2014-12-11 NOTE — Progress Notes (Signed)
Physical Therapy Treatment Patient Details Name: Jeremy [name] MRN: [id number] DOB: 12/25/1925 Today's Date: 12/11/2014    History of Present Illness Pt adm from SNF with PNA after aspiration after vomiting. PMH -dementia, visual impairment, CAD, CHF, HTN    PT Comments    Pt is reluctant to stand and not able to walk as in previous therapy sessions, although not clear why.  May have been too lethargic today and will expect HHPT to follow up with him as family taking home.  Pt is still at SNF level of care.  Follow Up Recommendations  SNF     Equipment Recommendations  None recommended by PT    Recommendations for Other Services       Precautions / Restrictions Precautions Precautions: Fall Restrictions Weight Bearing Restrictions: No    Mobility  Bed Mobility Overal bed mobility: Needs Assistance;+2 for physical assistance;+ 2 for safety/equipment Bed Mobility: Supine to Sit;Sit to Supine     Supine to sit: Mod assist        Transfers Overall transfer level: Needs assistance Equipment used: Rolling walker (2 wheeled) Transfers: Sit to/from Stand Sit to Stand: +2 physical assistance;Mod assist         General transfer comment: Assist to bring hips up. Heavy posterior lean  Ambulation/Gait Ambulation/Gait assistance: Mod assist;+2 physical assistance;+2 safety/equipment Ambulation Distance (Feet): 0 Feet             Stairs            Wheelchair Mobility    Modified Rankin (Stroke Patients Only)       Balance Overall balance assessment: Needs assistance Sitting-balance support: Feet supported Sitting balance-Leahy Scale: Fair Sitting balance - Comments: posterior lean mainly with extended sitting   Standing balance support: Bilateral upper extremity supported (with walker and 2 person assist) Standing balance-Leahy Scale: Poor                      Cognition Arousal/Alertness: Awake/alert Behavior During Therapy: WFL for  tasks assessed/performed Overall Cognitive Status: History of cognitive impairments - at baseline                      Exercises      General Comments General comments (skin integrity, edema, etc.): Pt has been seen by PT other days with gait being possible but was so extended in his trunk and unable to control his feet he did not present safely enough to walk.  May have been sleepy and unable to be alert enough to attempt it.      Pertinent Vitals/Pain Pain Assessment: Faces Pain Score: 0-No pain    Home Living                      Prior Function            PT Goals (current goals can now be found in the care plan section) Progress towards PT goals: Not progressing toward goals - comment (poor tolerance for standing today)    Frequency  Min 2X/week    PT Plan Current plan remains appropriate    Co-evaluation             End of Session   Activity Tolerance: Patient tolerated treatment well;Patient limited by lethargy Patient left: in bed;with call bell/phone within reach;with bed alarm set;with nursing/sitter in room;Other (comment) (transportation staff had arrived to take pt home)     Time: 7829-5621 PT Time Calculation (  min) (ACUTE ONLY): 28 min  Charges:  $Therapeutic Activity: 23-37 mins                    G Codes:      Jeremy [name], Jeremy [name] E 12/11/2014, 2:39 PM   Jeremy [name], PT MS Acute Rehab Dept. Number: [facility] [id number] and [facility] [phone]

## 2014-12-11 NOTE — Discharge Summary (Signed)
Physician Discharge Summary  Jeremy CoxJames T [name] BJY:[id number] DOB: [birth year] DOA: 12/04/2014  PCP: Jeremy AlimentSANDERS,ROBYN N, MD  Admit date: 12/04/2014 Discharge date: 12/11/2014  Time spent: 35 minutes  Recommendations for Outpatient Follow-up:  1. Patient was discharged home with home hospice services.  2. Case Manager was consulted, he was set up with hospital bed prior to discharge    Discharge Diagnoses:  Principal Problem:   HCAP (healthcare-associated pneumonia) Active Problems:   Essential hypertension   Hypothyroidism   Acute encephalopathy   UTI (lower urinary tract infection)   Dementia without behavioral disturbance   Protein-calorie malnutrition, severe   Aspiration pneumonia of right lower lobe due to regurgitated food (HCC)   CKD (chronic kidney disease) stage 3, GFR 30-59 ml/min   Anemia of chronic renal failure, stage 3 (moderate)   Dyslipidemia   Coronary artery disease involving native coronary artery of native heart without angina pectoris   BPH (benign prostatic hyperplasia)   Chronic diastolic congestive heart failure (HCC)   Depression   Palliative care encounter   Discharge Condition: Stable for transport  Diet recommendation: As tolerated  Filed Weights   12/04/14 2203 12/11/14 0420  Weight: 68.04 kg (150 lb) 65.6 kg (144 lb 10 oz)    History of present illness:  Jeremy [name] is a 81 y.o. male with a history of CAD, CHF, HTN, Hyperlipidemia, and Dementia who was brought from the [facility] after he was found to have blood around his foley catheter , and staff had noticed that he was having increased vomiting, and was vomiting in his sleep. Family reports that he had increased coughing spells after the vomiting episodes, and were concerned that he may have aspirated. He was seen in the ED and found to have a RLL infiltrate on Chest X-ray and evidence of a UTI and was placed on antibiotic Rx of IV Vancomycin and Zosyn and refereed for  admission.   Hospital Course:  81 y.o. male with a history of CAD, diastolic CHF ( 2 D ECHO in 01/2014 with EF 55% and grade 1 DD), HTN, Hyperlipidemia, and Dementia who [facility] after he was found to have blood around his foley catheter. In addition, he was found to have vomit while he was at sleep but unclear at which point pt vomited. In ED, CXR demonstrated focal right lower lobe pneumonia in addition to UTI. He was started on vanco and zosyn while awaiting urine culture results. Change to cipro  HCAP (healthcare-associated pneumonia) / Aspiration pneumonia of right lower lobe due to regurgitated food (HCC) - Pt from SNF - His CXR from 12/05/2014 showed focal right lower lobe pneumonia - He was transitioned to Augmentin 875 one tablet by mouth twice a day on 12/09/2014 given clinical improvement - Blood culture drawn on 11/07/2014 showing no growth to date -Plan to discharge on Augmentin therapy   UTI (lower urinary tract infection)- ? Colonization but had [name]/V symptoms - UA on admission showed large leukocytes and many bacteria - Urine culture pending- gram neg rods- citrobacter -Organism having resistance to multiple antimicrobials including beta lactams -Discharged on Cipro   Essential hypertension - Continue Imdur but monitor BP closely since SBP in 90's after he was given Imdur   Coronary artery disease involving native coronary artery of native heart without angina pectoris / Chronic diastolic CHF - Stable - Compensated - Last 2 DE CHO in 01/2014 with EF of 55% and grade 1  diastolic dysfunction - Continue aspirin and plavix - Continue simvastatin    Hypothyroidism - Continue synthroid    Acute encephalopathy/ Dementia without behavioral disturbance - No significant changes in mental status - Continue to monitor    CKD (chronic kidney disease) stage 3, GFR 30-59 ml/min - Baseline Cr about 1.4 and on this admission  within baseline range  -On 12/09/2014 had Cr of 0.95  Goals of care -I spoke to patient's daughter today regarding [name] overall medical goals of care. She would prefer for her father to be home with family members caring for him. She expresses concerns about what this would entail and what preparations they would need. She mentioned having aid to care for him during the day and family members at nighttime. We discussed philosophy of palliative care. She would like to arrange a meeting between her siblings and palliative care to discuss further [name] goals.  -On 12/10/2014 family meeting held with palliative care. Family members reporting that patient would prefer to go home to spend time with them rather than to be discharged to skilled nursing facility. They have elected to take [name] home with home hospice services providing care. Family members requesting that patient be discharged tomorrow in order for them to prepare their home and to allow for hospital bed to arrive.  -On 12/11/2014 hospital bed delivered. Patient discharged home with home hospice services.     Discharge Exam: Filed Vitals:   12/10/14 2235 12/11/14 0430  BP: 138/79 120/80  Pulse: 98 89  Temp: 98.7 F (37.1 C) 98 F (36.7 C)  Resp: 18 18     General: Pleasant/cooperative. In no acute distress. Daughter at bedside  Cardiovascular: Regular rate and rhythm, S1/S2 (+)  Respiratory: Clear to auscultation bilaterally, no wheezing, no crackles, no rhonchi  Abdomen: Soft, non tender, non distended, bowel sounds present  Extremities: No edema, pulses DP and PT palpable bilaterally  Neuro: Grossly nonfocal  Discharge Instructions   Discharge Instructions    Call MD for:  difficulty breathing, headache or visual disturbances    Complete by:  As directed      Call MD for:  extreme fatigue    Complete by:  As directed      Call MD for:  hives    Complete by:  As directed      Call MD for:   persistant dizziness or light-headedness    Complete by:  As directed      Call MD for:  persistant nausea and vomiting    Complete by:  As directed      Call MD for:  redness, tenderness, or signs of infection (pain, swelling, redness, odor or green/yellow discharge around incision site)    Complete by:  As directed      Call MD for:  severe uncontrolled pain    Complete by:  As directed      Call MD for:  temperature >100.4    Complete by:  As directed      Call MD for:    Complete by:  As directed      Diet - low sodium heart healthy    Complete by:  As directed      Increase activity slowly    Complete by:  As directed           Current Discharge Medication List    START taking these medications   Details  amoxicillin-clavulanate (AUGMENTIN) 875-125 MG tablet Take 1 tablet by mouth  every 12 (twelve) hours. Qty: 8 tablet, Refills: 0    ciprofloxacin (CIPRO) 500 MG tablet Take 1 tablet (500 mg total) by mouth daily with breakfast. Qty: 4 tablet, Refills: 0    oxyCODONE (OXY IR/ROXICODONE) 5 MG immediate release tablet Take 1 tablet (5 mg total) by mouth every 6 (six) hours as needed for moderate pain. Qty: 15 tablet, Refills: 0      CONTINUE these medications which have NOT CHANGED   Details  aspirin EC 81 MG tablet Take 81 mg by mouth daily.    clopidogrel (PLAVIX) 75 MG tablet Take 1 tablet (75 mg total) by mouth daily. Qty: 90 tablet, Refills: 3    dorzolamide (TRUSOPT) 2 % ophthalmic solution Place 1 drop into the right eye 2 (two) times daily.     finasteride (PROSCAR) 5 MG tablet Take 1 tablet (5 mg total) by mouth daily. Qty: 30 tablet, Refills: 0    guaiFENesin-dextromethorphan (ROBITUSSIN DM) 100-10 MG/5ML syrup Take 5 mLs by mouth every 4 (four) hours as needed for cough. Qty: 118 mL, Refills: 0    isosorbide mononitrate (IMDUR) 30 MG 24 hr tablet Take 1 tablet (30 mg total) by mouth daily. Qty: 90 tablet, Refills: 3    latanoprost (XALATAN) 0.005 %  ophthalmic solution Place 1 drop into both eyes at bedtime.    levothyroxine (SYNTHROID, LEVOTHROID) 75 MCG tablet Take 75 mcg by mouth daily. Refills: 0    QUEtiapine (SEROQUEL) 25 MG tablet Take 25 mg by mouth at bedtime. For behaviors    ranitidine (ZANTAC) 75 MG tablet Take 75 mg by mouth daily.    simvastatin (ZOCOR) 40 MG tablet Take 40 mg by mouth daily at 6 PM.     tamsulosin (FLOMAX) 0.4 MG CAPS capsule Take 0.4 mg by mouth daily. Refills: 0    timolol (TIMOPTIC) 0.5 % ophthalmic solution Place 1 drop into both eyes 2 (two) times daily.       STOP taking these medications     losartan (COZAAR) 50 MG tablet      metoprolol succinate (TOPROL-XL) 25 MG 24 hr tablet      Protein (PROCEL) POWD        Allergies  Allergen Reactions  . Tramadol Nausea And Vomiting   Follow-up Information    Follow up with Jeremy Aliment, MD On 12/21/2014.   Specialty:  Internal Medicine   Why:  Appointment with Dr. Allyne Gee is on 12/21/14 at [facility] information:   [address] [place] [address] [phone]        The results of significant diagnostics from this hospitalization (including imaging, microbiology, ancillary and laboratory) are listed below for reference.    Significant Diagnostic Studies: Dg Chest 2 View  12/05/2014  CLINICAL DATA:  81 year old male with productive cough EXAM: CHEST  2 VIEW COMPARISON:  Radiograph dated 11/10/2014 FINDINGS: Two views of the chest demonstrate focal area of mild increased vascular and interstitial prominence in the right lower lung field and right infrahilar region this may represent focal area of subsegmental atelectasis. Developing pneumonia is not excluded. Clinical correlation and follow-up recommended. There is no focal consolidation, pleural effusion or pneumothorax. The aorta is tortuous. Stable cardiac silhouette. The osseous structures are grossly unremarkable. IMPRESSION: Focal right lower lung field  subsegmental atelectasis versus developing pneumonia. Clinical correlation is recommended. Electronically Signed   By: Elgie Collard M.D.   On: 12/05/2014 00:55    Microbiology: Recent Results (from the past 240 hour(s))  Urine culture     Status: None   Collection Time: 12/05/14 12:39 AM  Result Value Ref Range Status   Specimen Description URINE, CATHETERIZED  Final   Special Requests NONE  Final   Culture >=100,000 COLONIES/mL CITROBACTER BRAAKII  Final   Report Status 12/08/2014 FINAL  Final   Organism ID, Bacteria CITROBACTER BRAAKII  Final      Susceptibility   Citrobacter braakii - MIC*    CEFAZOLIN >=64 RESISTANT Resistant     CEFTRIAXONE >=64 RESISTANT Resistant     CIPROFLOXACIN <=0.25 SENSITIVE Sensitive     GENTAMICIN <=1 SENSITIVE Sensitive     IMIPENEM <=0.25 SENSITIVE Sensitive     NITROFURANTOIN <=16 SENSITIVE Sensitive     TRIMETH/SULFA <=20 SENSITIVE Sensitive     PIP/TAZO >=128 RESISTANT Resistant     * >=100,000 COLONIES/mL CITROBACTER BRAAKII  Urine culture     Status: None   Collection Time: 12/05/14  1:45 AM  Result Value Ref Range Status   Specimen Description URINE, CATHETERIZED  Final   Special Requests NONE  Final   Culture >=100,000 COLONIES/mL CITROBACTER BRAAKII  Final   Report Status 12/07/2014 FINAL  Final   Organism ID, Bacteria CITROBACTER BRAAKII  Final      Susceptibility   Citrobacter braakii - MIC*    CEFAZOLIN >=64 RESISTANT Resistant     CEFTRIAXONE >=64 RESISTANT Resistant     CIPROFLOXACIN <=0.25 SENSITIVE Sensitive     GENTAMICIN <=1 SENSITIVE Sensitive     IMIPENEM 1 SENSITIVE Sensitive     NITROFURANTOIN <=16 SENSITIVE Sensitive     TRIMETH/SULFA <=20 SENSITIVE Sensitive     PIP/TAZO 32 INTERMEDIATE Intermediate     * >=100,000 COLONIES/mL CITROBACTER BRAAKII  MRSA PCR Screening     Status: None   Collection Time: 12/07/14 11:25 AM  Result Value Ref Range Status   MRSA by PCR NEGATIVE NEGATIVE Final    Comment:        The  GeneXpert MRSA Assay (FDA approved for NASAL specimens only), is one component of a comprehensive MRSA colonization surveillance program. It is not intended to diagnose MRSA infection nor to guide or monitor treatment for MRSA infections.      Labs: Basic Metabolic Panel:  Recent Labs Lab 12/05/14 0055 12/05/14 0557 12/09/14 0035  NA 139 139 139  K 4.3 3.6 3.5  CL 109 110 108  CO2 20* 22 23  GLUCOSE 111* 110* 101*  BUN 31* 30* 14  CREATININE 1.45* 1.46* 0.92  CALCIUM 8.5* 8.6* 8.5*   Liver Function Tests:  Recent Labs Lab 12/05/14 0055  AST 29  ALT 20  ALKPHOS 71  BILITOT 1.0  PROT 6.9  ALBUMIN 3.0*   No results for input(s): LIPASE, AMYLASE in the last 168 hours. No results for input(s): AMMONIA in the last 168 hours. CBC:  Recent Labs Lab 12/05/14 0055 12/05/14 0557 12/09/14 0035  WBC 9.9 10.3 8.2  NEUTROABS 7.6  --   --   HGB 11.0* 11.1* 10.9*  HCT 34.1* 33.9* 33.3*  MCV 88.8 88.3 87.9  PLT 166 211 185   Cardiac Enzymes: No results for input(s): CKTOTAL, CKMB, CKMBINDEX, TROPONINI in the last 168 hours. BNP: BNP (last 3 results) No results for input(s): BNP in the last 8760 hours.  ProBNP (last 3 results) No results for input(s): PROBNP in the last 8760 hours.  CBG: No results for input(s): GLUCAP in the last 168 hours.     Signed:  Jeralyn Bennett  [facility]  12/11/2014, 1:50 PM

## 2014-12-11 NOTE — Progress Notes (Signed)
Pt and family given discharge instructions, prescriptions, and care notes. Pt family verbalized understanding AEB no further questions or concerns at this time. IV was discontinued, no redness, pain, or swelling noted at this time. Pt left the floor via transport staff in stable condition.

## 2014-12-11 NOTE — Care Management Important Message (Signed)
Important Message  Patient Details  Name: Jeremy [name] MRN: [id number] Date of Birth: 11/29/1925   Medicare Important Message Given:  Yes    Mardene SayerGreenlee, Estera Ozier 12/11/2014, 4:44 PM

## 2015-01-25 ENCOUNTER — Emergency Department (HOSPITAL_COMMUNITY)
Admission: EM | Admit: 2015-01-25 | Discharge: 2015-01-26 | Disposition: A | Discharge status: Home / Self Care | Payer: Medicare Other | Attending: Emergency Medicine | Admitting: Emergency Medicine

## 2015-01-25 ENCOUNTER — Encounter (HOSPITAL_COMMUNITY): Payer: Self-pay

## 2015-01-25 DIAGNOSIS — Z87891 Personal history of nicotine dependence: Secondary | ICD-10-CM | POA: Diagnosis not present

## 2015-01-25 DIAGNOSIS — Z8619 Personal history of other infectious and parasitic diseases: Secondary | ICD-10-CM | POA: Insufficient documentation

## 2015-01-25 DIAGNOSIS — E43 Unspecified severe protein-calorie malnutrition: Secondary | ICD-10-CM | POA: Diagnosis not present

## 2015-01-25 DIAGNOSIS — Z7982 Long term (current) use of aspirin: Secondary | ICD-10-CM | POA: Diagnosis not present

## 2015-01-25 DIAGNOSIS — Z792 Long term (current) use of antibiotics: Secondary | ICD-10-CM | POA: Diagnosis not present

## 2015-01-25 DIAGNOSIS — Z79899 Other long term (current) drug therapy: Secondary | ICD-10-CM | POA: Insufficient documentation

## 2015-01-25 DIAGNOSIS — Z7902 Long term (current) use of antithrombotics/antiplatelets: Secondary | ICD-10-CM | POA: Insufficient documentation

## 2015-01-25 DIAGNOSIS — R0989 Other specified symptoms and signs involving the circulatory and respiratory systems: Secondary | ICD-10-CM | POA: Diagnosis present

## 2015-01-25 DIAGNOSIS — Z87440 Personal history of urinary (tract) infections: Secondary | ICD-10-CM | POA: Diagnosis not present

## 2015-01-25 DIAGNOSIS — F0390 Unspecified dementia without behavioral disturbance: Secondary | ICD-10-CM | POA: Insufficient documentation

## 2015-01-25 HISTORY — DX: Sepsis, unspecified organism: A41.9

## 2015-01-25 HISTORY — DX: Urinary tract infection, site not specified: N39.0

## 2015-01-25 HISTORY — DX: Unspecified dementia, unspecified severity, without behavioral disturbance, psychotic disturbance, mood disturbance, and anxiety: F03.90

## 2015-01-25 NOTE — ED Provider Notes (Signed)
CSN: [id number]     Arrival date & time 01/25/15  2321 History  By signing my name below, I, Iona Beard, attest that this documentation has been prepared under the direction and in the presence of [name], MD.   Electronically Signed: Iona Beard, ED Scribe. 01/25/2015. 12:36 AM    Chief Complaint  Patient presents with  . Choking     The history is provided by the patient and a relative. No language interpreter was used.   HPI Comments: Jeremy [name] is a 82 y.o. male who presents to the Emergency Department complaining of choking, onset a few hours ago after eating dinner and being put to bed for the night. He was checked by a healthcare worker who noticed his eyes were rolled back, had bluish skin tone, and he was non-responsive. The worker was able to get him to vomit which seemed to improve his symptoms.Healthcare worker did not notice any seizure-like activity.  Daughter states he was complaining of being overly full at dinner but was at baseline. He stated that he feels fine and believes he was choking on his vomit. Daughter noted that he had a similar episode of vomiting and shaking. He received suction and returned to baseline. Daughter also reported that the pt had a previous rash to his left ear after being on antibiotics which seems to have returned. They have applied neosporin with no relief. Pt has no ear pain. Pt has PMHx of UTI and enlarged prostate and he has catheter to allow him to urinate. Since receiving the catheter, his daughter noted he has experienced 2 UTIs. Pt denies pain or other symptoms.   Past Medical History  Diagnosis Date  . Sepsis (HCC)   . UTI (lower urinary tract infection)   . Dementia    Past Surgical History  Procedure Laterality Date  . Coronary angioplasty with stent placement    . Thyroidectomy, partial    . Renal doppler  08/17/2005    Normal evaluation  . Cardiac catheterization  02/25/2002    Proximal L Circumflex 95% lesion,  stented w/ 3x18-mm CYPHER stent avoiding the 2 L Marginal branch, jailing the 1st marginal, resulting in reduction of a 90-95% lesion to 0% residual  . Cardiovascular stress test  12/18/2006    EKG negative for ischemia, no significant ischemia demonstrated.  . Transthoracic echocardiogram  03/13/2002    EF >55%, moderate LVH,   . Back surgery     Family History  Problem Relation Age of Onset  . Family history unknown: Yes   Social History  Substance Use Topics  . Smoking status: Former Games developer  . Smokeless tobacco: Never Used     Comment: Quit 50+ years ago.  . Alcohol Use: No    Review of Systems  10 Systems reviewed and all are negative for acute change except as noted in the HPI.  Allergies  Tramadol  Home Medications   Prior to Admission medications   Medication Sig Start Date End Date Taking? Authorizing Provider  amoxicillin-clavulanate (AUGMENTIN) 875-125 MG tablet Take 1 tablet by mouth every 12 (twelve) hours. 12/11/14   Jeralyn Bennett, MD  aspirin EC 81 MG tablet Take 81 mg by mouth daily.    Historical Provider, MD  ciprofloxacin (CIPRO) 500 MG tablet Take 1 tablet (500 mg total) by mouth daily with breakfast. 12/11/14   Jeralyn Bennett, MD  clopidogrel (PLAVIX) 75 MG tablet Take 1 tablet (75 mg total) by mouth daily. 10/30/14   Delton See  Allyson Sabal, MD  dorzolamide (TRUSOPT) 2 % ophthalmic solution Place 1 drop into the right eye 2 (two) times daily.  08/23/12   Historical Provider, MD  finasteride (PROSCAR) 5 MG tablet Take 1 tablet (5 mg total) by mouth daily. Patient taking differently: Take 5 mg by mouth every evening.  10/13/14   Albertine Grates, MD  guaiFENesin-dextromethorphan (ROBITUSSIN DM) 100-10 MG/5ML syrup Take 5 mLs by mouth every 4 (four) hours as needed for cough. 11/11/14   Richarda Overlie, MD  isosorbide mononitrate (IMDUR) 30 MG 24 hr tablet Take 1 tablet (30 mg total) by mouth daily. 10/30/14   Runell Gess, MD  latanoprost (XALATAN) 0.005 % ophthalmic solution  Place 1 drop into both eyes at bedtime.    Historical Provider, MD  levothyroxine (SYNTHROID, LEVOTHROID) 75 MCG tablet Take 75 mcg by mouth daily. 11/06/14   Historical Provider, MD  oxyCODONE (OXY IR/ROXICODONE) 5 MG immediate release tablet Take 1 tablet (5 mg total) by mouth every 6 (six) hours as needed for moderate pain. 12/11/14   Jeralyn Bennett, MD  QUEtiapine (SEROQUEL) 25 MG tablet Take 25 mg by mouth at bedtime. For behaviors    Historical Provider, MD  ranitidine (ZANTAC) 75 MG tablet Take 75 mg by mouth daily.    Historical Provider, MD  simvastatin (ZOCOR) 40 MG tablet Take 40 mg by mouth daily at 6 PM.     Historical Provider, MD  tamsulosin (FLOMAX) 0.4 MG CAPS capsule Take 0.4 mg by mouth daily. 10/13/14   Historical Provider, MD  timolol (TIMOPTIC) 0.5 % ophthalmic solution Place 1 drop into both eyes 2 (two) times daily.  09/18/12   Historical Provider, MD   BP 139/89 mmHg  Pulse 96  Temp(Src) 98.1 F (36.7 C) (Oral)  Resp 11  SpO2 100% Physical Exam  Constitutional: He is oriented to person, place, and time. Vital signs are normal. He appears well-developed and well-nourished.  Non-toxic appearance. He does not appear ill. No distress.  HENT:  Head: Normocephalic and atraumatic.  Nose: Nose normal.  Mouth/Throat: Oropharynx is clear and moist. No oropharyngeal exudate.  1 cm crusted erythematous rash to left inner ear.  Similar rash over mastoid bone.  Eyes: Conjunctivae and EOM are normal. Pupils are equal, round, and reactive to light. No scleral icterus.  Neck: Normal range of motion. Neck supple. No tracheal deviation, no edema, no erythema and normal range of motion present. No thyroid mass and no thyromegaly present.  Cardiovascular: Normal rate, regular rhythm, S1 normal, S2 normal, normal heart sounds, intact distal pulses and normal pulses.  Exam reveals no gallop and no friction rub.   No murmur heard. Pulmonary/Chest: Effort normal and breath sounds normal. No  respiratory distress. He has no wheezes. He has no rhonchi. He has no rales.  Abdominal: Soft. Normal appearance and bowel sounds are normal. He exhibits no distension, no ascites and no mass. There is no hepatosplenomegaly. There is no tenderness. There is no rebound, no guarding and no CVA tenderness.  Musculoskeletal: Normal range of motion. He exhibits no edema or tenderness.  Lymphadenopathy:    He has no cervical adenopathy.  Neurological: He is alert and oriented to person, place, and time. He has normal strength. No cranial nerve deficit or sensory deficit.  Skin: Skin is warm, dry and intact. Rash noted. No petechiae noted. He is not diaphoretic. No erythema. No pallor.  Psychiatric: He has a normal mood and affect. His behavior is normal. Judgment normal.  Nursing note and  vitals reviewed.   ED Course  Procedures  DIAGNOSTIC STUDIES: Oxygen Saturation is 95% on RA, adequate by my interpretation.    COORDINATION OF CARE:  12:26 AM-Discussed treatment plan which includes CXR, CBC with differential, CMP, lipase, lactic acid, urinalysis, and urine culture with pt at bedside and pt agreed to plan.   Labs Review Labs Reviewed  CBC WITH DIFFERENTIAL/PLATELET - Abnormal; Notable for the following:    RBC 4.03 (*)    Hemoglobin 11.5 (*)    HCT 35.5 (*)    Neutro Abs 8.6 (*)    All other components within normal limits  COMPREHENSIVE METABOLIC PANEL - Abnormal; Notable for the following:    CO2 21 (*)    Glucose, Bld 115 (*)    Albumin 2.8 (*)    AST 77 (*)    Total Bilirubin 1.3 (*)    GFR calc non Af Amer 52 (*)    All other components within normal limits  URINALYSIS, ROUTINE W REFLEX MICROSCOPIC (NOT AT [facility]) - Abnormal; Notable for the following:    Color, Urine AMBER (*)    APPearance CLOUDY (*)    Hgb urine dipstick LARGE (*)    Bilirubin Urine SMALL (*)    Ketones, ur 15 (*)    Protein, ur 30 (*)    Leukocytes, UA MODERATE (*)    All other components within normal  limits  URINE MICROSCOPIC-ADD ON - Abnormal; Notable for the following:    Squamous Epithelial / LPF 0-5 (*)    Bacteria, UA RARE (*)    All other components within normal limits  I-STAT CG4 LACTIC ACID, ED - Abnormal; Notable for the following:    Lactic Acid, Venous 2.96 (*)    All other components within normal limits  URINE CULTURE  LIPASE, BLOOD    Imaging Review Dg Chest 2 View  01/26/2015  CLINICAL DATA:  Patient became unresponsive. Apnea. Vomiting. Initial encounter. EXAM: CHEST  2 VIEW COMPARISON:  Chest radiograph performed 12/04/2014 FINDINGS: The lungs are well-aerated and clear. There is no evidence of focal opacification, pleural effusion or pneumothorax. A likely calcified granuloma is noted at the right lung apex. The heart is normal in size; the mediastinal contour is within normal limits. No acute osseous abnormalities are seen. IMPRESSION: No acute cardiopulmonary process seen. Electronically Signed   By: Roanna Raider M.D.   On: 01/26/2015 00:17   I have personally reviewed and evaluated these images and lab results as part of my medical decision-making.   EKG Interpretation   Date/Time:  Monday January 25 2015 23:29:06 EST Ventricular Rate:  89 PR Interval:  161 QRS Duration: 126 QT Interval:  394 QTC Calculation: 479 R Axis:   -54 Text Interpretation:  Sinus rhythm Nonspecific IVCD with LAD Probable  anteroseptal infarct, old No significant change since last tracing  Confirmed by Erroll Luna [id number]) on 01/26/2015 12:42:34 AM      MDM   Final diagnoses:  None   Patient presents to the emergency department for an episode of being unresponsive and turning cyanotic. Presently he has no complaints. He appears quite well. Will obtain large workup for this possible episode. Laboratory studies are unremarkable other than a nonspecific lactate of 2.96. Patient was given IV fluids in the emergency department. Urinalysis and chest x-ray negative for  infection. EKG does not show any signs of ischemia. He has had no recurrence of his symptoms. It is possible the patient did choke on his dinner. He has  been observed all night in the emergency department and appears well and in no acute distress, vital signs remain within his normal limits and he is safe for discharge.    I personally performed the services described in this documentation, which was scribed in my presence. The recorded information has been reviewed and is accurate.       [name], MD 01/26/15 [phone]

## 2015-01-25 NOTE — ED Notes (Signed)
Pts family and caregiver states that the pt refused to eat or drink anything today.

## 2015-01-25 NOTE — ED Notes (Signed)
Per [facility]: Pt in hospital bed at home, pt went unresponsive, turned blue, and stopped breathing. Caregiver started compressions on the pt for 1 minute, attempted the heimlick maneuver, turned the pt on his side, stuck their fingers down his throat the pt vomited, woke up and was acting his normal self. Pt was alert on EMS arrival. Pulses weak at 80, BP 70/48 initially, then 140/90. Pt does have dementia. Pt has not vomited since then, has not been short of breath. CBG 146. Lung sounds clear.

## 2015-01-26 ENCOUNTER — Emergency Department (HOSPITAL_COMMUNITY): Payer: Medicare Other

## 2015-01-26 LAB — CBC WITH DIFFERENTIAL/PLATELET
BASOS ABS: 0 10*3/uL (ref 0.0–0.1)
Basophils Relative: 0 %
EOS ABS: 0 10*3/uL (ref 0.0–0.7)
Eosinophils Relative: 0 %
HCT: 35.5 % — ABNORMAL LOW (ref 39.0–52.0)
Hemoglobin: 11.5 g/dL — ABNORMAL LOW (ref 13.0–17.0)
LYMPHS ABS: 1.1 10*3/uL (ref 0.7–4.0)
LYMPHS PCT: 10 %
MCH: 28.5 pg (ref 26.0–34.0)
MCHC: 32.4 g/dL (ref 30.0–36.0)
MCV: 88.1 fL (ref 78.0–100.0)
Monocytes Absolute: 0.8 10*3/uL (ref 0.1–1.0)
Monocytes Relative: 8 %
NEUTROS ABS: 8.6 10*3/uL — AB (ref 1.7–7.7)
Neutrophils Relative %: 82 %
PLATELETS: 218 10*3/uL (ref 150–400)
RBC: 4.03 MIL/uL — AB (ref 4.22–5.81)
RDW: 15.1 % (ref 11.5–15.5)
WBC: 10.5 10*3/uL (ref 4.0–10.5)

## 2015-01-26 LAB — URINALYSIS, ROUTINE W REFLEX MICROSCOPIC
Glucose, UA: NEGATIVE mg/dL
Ketones, ur: 15 mg/dL — AB
NITRITE: NEGATIVE
PH: 5.5 (ref 5.0–8.0)
Protein, ur: 30 mg/dL — AB
SPECIFIC GRAVITY, URINE: 1.019 (ref 1.005–1.030)

## 2015-01-26 LAB — COMPREHENSIVE METABOLIC PANEL
ALBUMIN: 2.8 g/dL — AB (ref 3.5–5.0)
ALT: 54 U/L (ref 17–63)
ANION GAP: 14 (ref 5–15)
AST: 77 U/L — ABNORMAL HIGH (ref 15–41)
Alkaline Phosphatase: 85 U/L (ref 38–126)
BUN: 18 mg/dL (ref 6–20)
CHLORIDE: 103 mmol/L (ref 101–111)
CO2: 21 mmol/L — AB (ref 22–32)
Calcium: 8.9 mg/dL (ref 8.9–10.3)
Creatinine, Ser: 1.21 mg/dL (ref 0.61–1.24)
GFR calc non Af Amer: 52 mL/min — ABNORMAL LOW (ref >60)
Glucose, Bld: 115 mg/dL — ABNORMAL HIGH (ref 65–99)
Potassium: 4.1 mmol/L (ref 3.5–5.1)
SODIUM: 138 mmol/L (ref 135–145)
Total Bilirubin: 1.3 mg/dL — ABNORMAL HIGH (ref 0.3–1.2)
Total Protein: 8.1 g/dL (ref 6.5–8.1)

## 2015-01-26 LAB — LIPASE, BLOOD: LIPASE: 30 U/L (ref 11–51)

## 2015-01-26 LAB — URINE MICROSCOPIC-ADD ON

## 2015-01-26 LAB — I-STAT CG4 LACTIC ACID, ED: Lactic Acid, Venous: 2.96 mmol/L (ref 0.5–2.0)

## 2015-01-26 MED ORDER — MUPIROCIN CALCIUM 2 % EX CREA
TOPICAL_CREAM | CUTANEOUS | Status: AC
  Administered 2015-01-26: 07:00:00 via TOPICAL
  Filled 2015-01-26: qty 15

## 2015-01-26 MED ORDER — SODIUM CHLORIDE 0.9 % IV BOLUS (SEPSIS)
1000.0000 mL | Freq: Once | INTRAVENOUS | Status: AC
  Administered 2015-01-26: 1000 mL via INTRAVENOUS

## 2015-01-26 NOTE — Discharge Instructions (Signed)
Dehydration Jeremy [name], your blood work today shows dehydration.  Be sure to eat and drink normal amounts daily. See a primary care physician within 3 days for close follow-up. If any symptoms worsen come back to emergency department immediately. Thank you. Dehydration means your body does not have as much fluid as it needs. Your kidneys, brain, and heart will not work properly without the right amount of fluids and salt. Older adults are more likely to become dehydrated than younger adults. This is because:   Their bodies do not hold water as well.  Their bodies do not respond to temperature changes as well.  They do not get thirsty as easily or as quickly. HOME CARE  Ask your doctor how to replace body fluid losses (rehydrate).  Drink enough fluids to keep your pee (urine) clear or pale yellow.  Drink small amounts of fluids often if you feel sick to your stomach (nauseous) or throw up (vomit).  Eat like you normally do.  Avoid:  Foods or drinks high in sugar.  Bubbly (carbonated) drinks.  Juice.  Very hot or cold fluids.  Drinks with caffeine.  Fatty, greasy foods.  Alcohol.  Tobacco.  Eating too much.  Gelatin desserts.  Wash your hands to avoid spreading germs (bacteria, viruses).  Only take medicine as told by your doctor.  Keep all doctor visits as told. GET HELP IF:  You have belly (abdominal) pain that gets worse or stays in one spot (localizes).  You have a rash, stiff neck, or bad headache.  You get easily annoyed, sleepy, or are hard to wake up.  You feel weak, dizzy, or very thirsty.  You have a fever. GET HELP RIGHT AWAY IF:   You cannot drink fluid without throwing up.  You get worse even with treatment.  You throw up often.  You have watery poop (diarrhea) often.  Your vomit has blood in it or looks greenish.  Your poop (stool) has blood in it or looks black and tarry.  You have not peed in 6 to 8 hours or have only peed a small  amount of very dark pee.  You pass out (faint). MAKE SURE YOU:   Understand these instructions.  Will watch your condition.  Will get help right away if you are not doing well or get worse.   This information is not intended to replace advice given to you by your health care provider. Make sure you discuss any questions you have with your health care provider.   Document Released: 12/08/2010 Document Revised: 12/24/2012 Document Reviewed: 08/26/2012 Elsevier Interactive Patient Education [employer]

## 2015-01-26 NOTE — ED Notes (Signed)
PTAR CALLED @ [phone].

## 2015-01-26 NOTE — ED Notes (Signed)
IV team at bedside 

## 2015-01-28 LAB — URINE CULTURE

## 2015-01-29 ENCOUNTER — Telehealth (HOSPITAL_BASED_OUTPATIENT_CLINIC_OR_DEPARTMENT_OTHER): Payer: Self-pay | Admitting: Emergency Medicine

## 2015-01-29 NOTE — Telephone Encounter (Signed)
Post ED Visit - Positive Culture Follow-up  Culture report reviewed by antimicrobial stewardship pharmacist:   Enzo Bi, Pharm.D.  Celedonio Miyamoto, Pharm.D., BCPS  Garvin Fila, Pharm.D.  Georgina Pillion, Pharm.D., BCPS  [place], [place].D., BCPS, AAHIVP  Estella Husk, Pharm.D., BCPS, AAHIVP  Tennis Must, Pharm.D.  Sherle Poe, [place].D.  Positive urine culture staph Treated with none, asymptomatic, organism sensitive to the same and no further patient follow-up is required at this time.  Berle Mull 01/29/2015, 9:40 AM

## 2015-01-29 NOTE — Progress Notes (Signed)
ED Antimicrobial Stewardship Positive Culture Follow Up   Jeremy [name] is an 82 y.o. male who presented to [facility] on 01/25/2015 with a chief complaint of  Chief Complaint  Patient presents with  . Choking    Recent Results (from the past 720 hour(s))  Urine culture     Status: None   Collection Time: 01/26/15  3:28 AM  Result Value Ref Range Status   Specimen Description URINE, CATHETERIZED  Final   Special Requests NONE  Final   Culture   Final    40,000 COLONIES/ml METHICILLIN RESISTANT STAPHYLOCOCCUS AUREUS   Report Status 01/28/2015 FINAL  Final   Organism ID, Bacteria METHICILLIN RESISTANT STAPHYLOCOCCUS AUREUS  Final      Susceptibility   Methicillin resistant staphylococcus aureus - MIC*    CIPROFLOXACIN >=8 RESISTANT Resistant     GENTAMICIN <=0.5 SENSITIVE Sensitive     NITROFURANTOIN <=16 SENSITIVE Sensitive     OXACILLIN >=4 RESISTANT Resistant     TETRACYCLINE <=1 SENSITIVE Sensitive     VANCOMYCIN <=0.5 SENSITIVE Sensitive     TRIMETH/SULFA <=10 SENSITIVE Sensitive     CLINDAMYCIN <=0.25 SENSITIVE Sensitive     RIFAMPIN <=0.5 SENSITIVE Sensitive     Inducible Clindamycin NEGATIVE Sensitive     * 40,000 COLONIES/ml METHICILLIN RESISTANT STAPHYLOCOCCUS AUREUS   No treatment indicated at this time--MRSA colonization of foley catheter.  ED Provider: Eyvonne Mechanic, PA-C  Lisette Grinder, PharmD Candidate

## 2015-02-17 ENCOUNTER — Telehealth: Payer: Self-pay | Admitting: Neurology

## 2015-02-17 NOTE — Telephone Encounter (Signed)
Dr. Terrace Arabia reviewed his chart - said it would be better with his dementia/history to take Seroquel on a regular basis.  Spoke to Stanton Kidney - she is aware of Dr. Zannie Cove response.

## 2015-02-17 NOTE — Telephone Encounter (Signed)
[name],Jeremy [name],  has called and says that the caregiver and her sister,Jeremy [name],  have not been giving pt QUEtiapine (SEROQUEL) 25 MG tablet on a regular basis. She wants to know if the pt should be taking Seroquel daily or if there is any harm on giving him the medication on an as needed basis. PT came in on Monday to work with him. Tuesday pt complained of muscle pain. He was give Oxycodone at 3p. Please call and advise [phone], Stanton Kidney

## 2015-02-23 ENCOUNTER — Telehealth: Payer: Self-pay | Admitting: *Deleted

## 2015-02-23 ENCOUNTER — Ambulatory Visit: Payer: Medicare Other | Admitting: Neurology

## 2015-02-23 NOTE — Telephone Encounter (Signed)
His daughter called to let us know that transportation mixed his appointment time up and never showed up to his house.  His appointment has been rescheduled.

## 2015-02-24 ENCOUNTER — Encounter: Payer: Self-pay | Admitting: Neurology

## 2015-02-24 ENCOUNTER — Telehealth: Payer: Self-pay | Admitting: Neurology

## 2015-02-24 NOTE — Telephone Encounter (Signed)
Pt needs refill on QUEtiapine (SEROQUEL) 25 MG tablet. Please send to [facility]

## 2015-02-24 NOTE — Telephone Encounter (Signed)
Patient has a follow up 02/25/15 - will wait to send refill until this follow up to make sure this is still an effective dosage for him.

## 2015-02-25 ENCOUNTER — Ambulatory Visit (INDEPENDENT_AMBULATORY_CARE_PROVIDER_SITE_OTHER): Payer: Medicare Other | Admitting: Neurology

## 2015-02-25 ENCOUNTER — Encounter: Payer: Self-pay | Admitting: Neurology

## 2015-02-25 VITALS — BP 128/82 | HR 95

## 2015-02-25 DIAGNOSIS — F0391 Unspecified dementia with behavioral disturbance: Secondary | ICD-10-CM | POA: Diagnosis not present

## 2015-02-25 DIAGNOSIS — F03918 Unspecified dementia, unspecified severity, with other behavioral disturbance: Secondary | ICD-10-CM

## 2015-02-25 DIAGNOSIS — R269 Unspecified abnormalities of gait and mobility: Secondary | ICD-10-CM

## 2015-02-25 MED ORDER — QUETIAPINE FUMARATE 25 MG PO TABS: 25.0000 mg | ORAL_TABLET | Freq: Every day | ORAL | Status: DC

## 2015-02-25 NOTE — Progress Notes (Signed)
Chief Complaint  Patient presents with  . Dementia    MMSE 15/27 (unable to complete last three - legally blind) - 5 animals.  He is here with his daughters, Stanton Kidney and Sedalia Muta.  He has been in the hospital several times for urinary tract infections and once because he stopped breathing.  Reports he is still doing well on Seroquel 25mg  at bedtime.   Chief Complaint  Patient presents with  . Dementia    MMSE 15/27 (unable to complete last three - legally blind) - 5 animals.  He is here with his daughters, Stanton Kidney and Sedalia Muta.  He has been in the hospital several times for urinary tract infections and once because he stopped breathing.  Reports he is still doing well on Seroquel 25mg  at bedtime.      PATIENT: Jeremy [name] DOB: December 05, 1925  Chief Complaint  Patient presents with  . Dementia    MMSE 15/27 (unable to complete last three - legally blind) - 5 animals.  He is here with his daughters, Stanton Kidney and Sedalia Muta.  He has been in the hospital several times for urinary tract infections and once because he stopped breathing.  Reports he is still doing well on Seroquel 25mg  at bedtime.     HISTORICAL  Jeremy [name] Riversis a 82 years old right-handed male, seen in refer by  His primary care physician Dr. Dorothyann Peng, accompanied by his daughter [name] in October 31st 2016 for evaluation of memory trouble, confusion, difficulty with language  He gradudate from high school, worked at [employer], retired at 65, continue to be active, enjoyed traveling, bowling,was doing exercise classes regularly until end of September 2016, he had fairly acute onset gait difficulty, urinary retention, was also found to be confused, difficulty talking,  He was admitted to hospital in October 9 to 11 2016 for right hip pain,lower abdominal pain, CAT scan showed right hip arthritis. I have personally reviewed CAT scan of brain, moderate periventricular small vessel disease, no acute lesions.  He is now back home with  home rehabilitation, ambulate with a walker, had Foley catheter in, going through urology evaluation, he has lost both of his vision around 2013 due to glaucoma, macular degenerations, was highly function with his visual loss, able to fix simple breakfast use microwave. But now he ambulate with a walker, unsteady stiff gait, need more help in daily activity.   UPDATE Nov 23 2014: He is with his daughter [name] at today's clinical visit, he was recently admitted to hospital for UTI in November 2016, urinary retention, had Foley catheter placed since, daughter reported elevated PSA, is under close observation of urologist Since hospital admission in November 2016, he has increased confusion, he is now discharged to [facility], he could no longer feed himself, poor vision, need assistant to get up to take a few steps, poor appetite, Workup reviewed, CBC, BMET unremarkable, UA positive for UTI.normal TSH, B12, negative HIV, RPR I have personally reviewed MRI of the brain November 2016: No acute intracranial abnormality. Nonspecific cerebral white matter changes.  UPDATE Feb 25 2015: He was admitted to hospital admission in Dec 2016, I reviewed and summarized his hospital discharge, he was admitted for aspiration pneumonia, UTI, blood around Foley catheter, he is now back home with 24 hours aids since Dec 2016,  lives with his wife, He is taking seroquel 25mg  qhs,  He is receiving PT, OT now, walk with walker, overall he is much better at home  REVIEW OF SYSTEMS: Full  14 system review of systems performed and notable only for speech difficulty, memory loss, weakness, agitation, confusion, hallucinations, decreased urination, sleep walking, acting out of dreams, appetite change, eye redness.  ALLERGIES: Allergies  Allergen Reactions  . Tramadol Nausea And Vomiting    HOME MEDICATIONS: Current Outpatient Prescriptions  Medication Sig Dispense Refill  . aspirin EC 81 MG tablet Take 81 mg by mouth  daily.    . clopidogrel (PLAVIX) 75 MG tablet Take 1 tablet (75 mg total) by mouth daily. 90 tablet 3  . dorzolamide (TRUSOPT) 2 % ophthalmic solution Place 1 drop into the right eye 2 (two) times daily.     . finasteride (PROSCAR) 5 MG tablet Take 1 tablet (5 mg total) by mouth daily. (Patient taking differently: Take 5 mg by mouth every evening. ) 30 tablet 0  . isosorbide mononitrate (IMDUR) 30 MG 24 hr tablet Take 1 tablet (30 mg total) by mouth daily. 90 tablet 3  . latanoprost (XALATAN) 0.005 % ophthalmic solution Place 1 drop into both eyes at bedtime.    Marland Kitchen levothyroxine (SYNTHROID, LEVOTHROID) 75 MCG tablet Take 75 mcg by mouth daily.  0  . QUEtiapine (SEROQUEL) 25 MG tablet Take 25 mg by mouth at bedtime. For behaviors    . ranitidine (ZANTAC) 75 MG tablet Take 75 mg by mouth daily.    . simvastatin (ZOCOR) 40 MG tablet Take 40 mg by mouth daily at 6 PM.     . timolol (TIMOPTIC) 0.5 % ophthalmic solution Place 1 drop into both eyes 2 (two) times daily.      No current facility-administered medications for this visit.    PAST MEDICAL HISTORY: Past Medical History  Diagnosis Date  . Sepsis (HCC)   . UTI (lower urinary tract infection)   . Dementia     PAST SURGICAL HISTORY: Past Surgical History  Procedure Laterality Date  . Coronary angioplasty with stent placement    . Thyroidectomy, partial    . Renal doppler  08/17/2005    Normal evaluation  . Cardiac catheterization  02/25/2002    Proximal L Circumflex 95% lesion, stented w/ 3x18-mm CYPHER stent avoiding the 2 L Marginal branch, jailing the 1st marginal, resulting in reduction of a 90-95% lesion to 0% residual  . Cardiovascular stress test  12/18/2006    EKG negative for ischemia, no significant ischemia demonstrated.  . Transthoracic echocardiogram  03/13/2002    EF >55%, moderate LVH,   . Back surgery      FAMILY HISTORY: Family History  Problem Relation Age of Onset  . Family history unknown: Yes    SOCIAL  HISTORY:  Social History   Social History  . Marital Status: Married    Spouse Name: N/A  . Number of Children: 4  . Years of Education: HS   Occupational History  . Retired    Social History Main Topics  . Smoking status: Former Games developer  . Smokeless tobacco: Never Used     Comment: Quit 50+ years ago.  . Alcohol Use: No  . Drug Use: No  . Sexual Activity: Not on file   Other Topics Concern  . Not on file   Social History Narrative   Lives with his wife.   Right-handed.   No caffeine use.     PHYSICAL EXAM   Filed Vitals:   02/25/15 0926  BP: 128/82  Pulse: 95    Not recorded      There is no weight on file to calculate BMI.  PHYSICAL EXAMNIATION:  Gen: NAD, conversant, well nourised, obese, well groomed                     Cardiovascular: Regular rate rhythm, no peripheral edema, warm, nontender. Eyes: Conjunctivae clear without exudates or hemorrhage Neck: Supple, no carotid bruise. Pulmonary: Clear to auscultation bilaterally   NEUROLOGICAL EXAM:  MENTAL STATUS: Speech:    Speech is slurred, mild hard of hearing, rely on his daughters for informations.  Cognition: Mini-Mental Status Examination 15 out of 27, animal naming is 5     Orientation to time, place and person: He is not oriented to date, year, date, season, Dr., state     Recent and remote memory: He missed 3 out of 3 recalls     Attention span and concentration: He could not spell world backwards     Normal Language, naming, repeating,spontaneous speech     He could not write or copy due to blind     CRANIAL NERVES: CN II:  left pupil was round, right pupil was irregular minimum reactive to light, he could not count fingers CN III, IV, VI: extraocular movement are normal. No ptosis. CN V: Facial sensation is intact to pinprick in all 3 divisions bilaterally. Corneal responses are intact.  CN VII: Face is symmetric with normal eye closure and smile. CN VIII: Hearing is normal to rubbing  fingers CN IX, X: Palate elevates symmetrically. Phonation is normal. CN XI: Head turning and shoulder shrug are intact CN XII: Tongue is midline with normal movements and no atrophy.  MOTOR: There is no pronator drift of out-stretched arms. Muscle bulk and tone are normal. Muscle strength is normal.  REFLEXES: Reflexes are 3 and symmetric at the biceps, triceps, knees, and ankles. Plantar responses are flexor.  SENSORY: Length dependent decreased to light touch, pinprick and vibratory sensation to distal shin  COORDINATION: Rapid alternating movements and fine finger movements are intact. There is no dysmetria on finger-to-nose and heel-knee-shin.    GAIT/STANCE: He needs 2 people assistant to get up from seated position, difficulty to initiate gait, DIAGNOSTIC DATA (LABS, IMAGING, TESTING) - I reviewed patient records, labs, notes, testing and imaging myself where available.   ASSESSMENT AND PLAN  NEMIAH KISSNER is a 82 y.o. male   Acute onset of confusion, memory loss  He has vascular risk factor of aging,hypertension, hyperlipidemia, previous coronary artery disease  Keep aspirin daily   Most consistent dementia, acute worsening in the setting of pneumonia, UTI,  I went over medication list in detail with his daughters, he is on 2 antiplatelet agent, aspirin plus Plavix by his cardiologist, this will increase the chance of bleeding, also suggested may consider stop Zantac,  Continue seroquel 25 mg every night for agitations  Bilateral blindness  Due to glaucoma, macular degeneration Gait difficulty, urinary retention  Hyperreflexia, 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 Ph.D.  [facility] [address] [place], [place] [address] Ph: [phone] Fax: [phone]  CC: Dorothyann Peng

## 2015-03-06 ENCOUNTER — Emergency Department (HOSPITAL_COMMUNITY)
Admission: EM | Admit: 2015-03-06 | Discharge: 2015-03-06 | Disposition: A | Discharge status: Home / Self Care | Payer: Medicare Other | Attending: Emergency Medicine | Admitting: Emergency Medicine

## 2015-03-06 ENCOUNTER — Encounter (HOSPITAL_COMMUNITY): Payer: Self-pay | Admitting: Emergency Medicine

## 2015-03-06 DIAGNOSIS — Z87891 Personal history of nicotine dependence: Secondary | ICD-10-CM | POA: Diagnosis not present

## 2015-03-06 DIAGNOSIS — Z792 Long term (current) use of antibiotics: Secondary | ICD-10-CM | POA: Diagnosis not present

## 2015-03-06 DIAGNOSIS — Z8619 Personal history of other infectious and parasitic diseases: Secondary | ICD-10-CM | POA: Insufficient documentation

## 2015-03-06 DIAGNOSIS — Z7982 Long term (current) use of aspirin: Secondary | ICD-10-CM | POA: Diagnosis not present

## 2015-03-06 DIAGNOSIS — N368 Other specified disorders of urethra: Secondary | ICD-10-CM | POA: Diagnosis not present

## 2015-03-06 DIAGNOSIS — Z79899 Other long term (current) drug therapy: Secondary | ICD-10-CM | POA: Insufficient documentation

## 2015-03-06 DIAGNOSIS — Z791 Long term (current) use of non-steroidal anti-inflammatories (NSAID): Secondary | ICD-10-CM | POA: Diagnosis not present

## 2015-03-06 DIAGNOSIS — N390 Urinary tract infection, site not specified: Secondary | ICD-10-CM

## 2015-03-06 DIAGNOSIS — Z9861 Coronary angioplasty status: Secondary | ICD-10-CM | POA: Diagnosis not present

## 2015-03-06 DIAGNOSIS — Z7902 Long term (current) use of antithrombotics/antiplatelets: Secondary | ICD-10-CM | POA: Diagnosis not present

## 2015-03-06 DIAGNOSIS — L98491 Non-pressure chronic ulcer of skin of other sites limited to breakdown of skin: Secondary | ICD-10-CM | POA: Diagnosis not present

## 2015-03-06 DIAGNOSIS — F0390 Unspecified dementia without behavioral disturbance: Secondary | ICD-10-CM | POA: Insufficient documentation

## 2015-03-06 DIAGNOSIS — L8995 Pressure ulcer of unspecified site, unstageable: Secondary | ICD-10-CM | POA: Diagnosis present

## 2015-03-06 LAB — BASIC METABOLIC PANEL
Anion gap: 10 (ref 5–15)
BUN: 16 mg/dL (ref 6–20)
CO2: 24 mmol/L (ref 22–32)
Calcium: 8.7 mg/dL — ABNORMAL LOW (ref 8.9–10.3)
Chloride: 106 mmol/L (ref 101–111)
Creatinine, Ser: 1.04 mg/dL (ref 0.61–1.24)
GFR calc Af Amer: >60 mL/min (ref >60)
GLUCOSE: 104 mg/dL — AB (ref 65–99)
POTASSIUM: 3 mmol/L — AB (ref 3.5–5.1)
Sodium: 140 mmol/L (ref 135–145)

## 2015-03-06 LAB — CBC WITH DIFFERENTIAL/PLATELET
BASOS ABS: 0 10*3/uL (ref 0.0–0.1)
BASOS PCT: 1 %
Eosinophils Absolute: 0.3 10*3/uL (ref 0.0–0.7)
Eosinophils Relative: 4 %
HEMATOCRIT: 32.4 % — AB (ref 39.0–52.0)
HEMOGLOBIN: 10.7 g/dL — AB (ref 13.0–17.0)
LYMPHS PCT: 32 %
Lymphs Abs: 2.4 10*3/uL (ref 0.7–4.0)
MCH: 29 pg (ref 26.0–34.0)
MCHC: 33 g/dL (ref 30.0–36.0)
MCV: 87.8 fL (ref 78.0–100.0)
MONOS PCT: 10 %
Monocytes Absolute: 0.7 10*3/uL (ref 0.1–1.0)
NEUTROS ABS: 4.1 10*3/uL (ref 1.7–7.7)
NEUTROS PCT: 54 %
Platelets: 277 10*3/uL (ref 150–400)
RBC: 3.69 MIL/uL — ABNORMAL LOW (ref 4.22–5.81)
RDW: 15.2 % (ref 11.5–15.5)
WBC: 7.6 10*3/uL (ref 4.0–10.5)

## 2015-03-06 LAB — URINE MICROSCOPIC-ADD ON

## 2015-03-06 LAB — URINALYSIS, ROUTINE W REFLEX MICROSCOPIC
Bilirubin Urine: NEGATIVE
GLUCOSE, UA: NEGATIVE mg/dL
Ketones, ur: NEGATIVE mg/dL
Nitrite: NEGATIVE
PROTEIN: 30 mg/dL — AB
SPECIFIC GRAVITY, URINE: 1.017 (ref 1.005–1.030)
pH: 6.5 (ref 5.0–8.0)

## 2015-03-06 MED ORDER — POTASSIUM CHLORIDE CRYS ER 20 MEQ PO TBCR
40.0000 meq | EXTENDED_RELEASE_TABLET | Freq: Once | ORAL | Status: AC
  Administered 2015-03-06: 40 meq via ORAL
  Filled 2015-03-06: qty 2

## 2015-03-06 MED ORDER — CEPHALEXIN 500 MG PO CAPS: 500.0000 mg | ORAL_CAPSULE | Freq: Four times a day (QID) | ORAL | Status: DC

## 2015-03-06 MED ORDER — LIDOCAINE HCL 2 % EX GEL
1.0000 "application " | Freq: Once | CUTANEOUS | Status: AC
  Administered 2015-03-06: 1 via URETHRAL
  Filled 2015-03-06: qty 11

## 2015-03-06 MED ORDER — BACITRACIN ZINC 500 UNIT/GM EX OINT
1.0000 "application " | TOPICAL_OINTMENT | Freq: Once | CUTANEOUS | Status: AC
  Administered 2015-03-06: 1 via TOPICAL
  Filled 2015-03-06: qty 0.9

## 2015-03-06 NOTE — ED Provider Notes (Signed)
CSN: [id number]     Arrival date & time 03/06/15  1106 History   First MD Initiated Contact with Patient 03/06/15 1113     Chief Complaint  Patient presents with  . Pressure Ulcer    HPI Patient presents to the emergency room for evaluation of a skin tear on his left hip. Patient has a history of dementia and decreased mobility. He has 24-hour caregivers at home.  The patient's caregivers yesterday noticed that he had some irritation in the inguinal region. The home health care nurse thought it may be related to a fungal infection and suggested antifungal cream. This morning they noticed a superficial 3 cm circular area of skin breakdown on the left hip.  He also noticed some redness of his feet. They contacted the primary care doctor on call who suggested he come to the emergency room to have a medical provider evaluate those areas. Patient's denied any trouble with fevers. He denies any vomiting. No abdominal pain or chest pain. No headache. Past Medical History  Diagnosis Date  . Sepsis (HCC)   . UTI (lower urinary tract infection)   . Dementia    Past Surgical History  Procedure Laterality Date  . Coronary angioplasty with stent placement    . Thyroidectomy, partial    . Renal doppler  08/17/2005    Normal evaluation  . Cardiac catheterization  02/25/2002    Proximal L Circumflex 95% lesion, stented w/ 3x18-mm CYPHER stent avoiding the 2 L Marginal branch, jailing the 1st marginal, resulting in reduction of a 90-95% lesion to 0% residual  . Cardiovascular stress test  12/18/2006    EKG negative for ischemia, no significant ischemia demonstrated.  . Transthoracic echocardiogram  03/13/2002    EF >55%, moderate LVH,   . Back surgery     Family History  Problem Relation Age of Onset  . Family history unknown: Yes   Social History  Substance Use Topics  . Smoking status: Former Games developer  . Smokeless tobacco: Never Used     Comment: Quit 50+ years ago.  . Alcohol Use: No    Review  of Systems  All other systems reviewed and are negative.     Allergies  Tramadol  Home Medications   Prior to Admission medications   Medication Sig Start Date End Date Taking? Authorizing Provider  aspirin EC 81 MG tablet Take 81 mg by mouth daily.   Yes Historical Provider, MD  clopidogrel (PLAVIX) 75 MG tablet Take 1 tablet (75 mg total) by mouth daily. 10/30/14  Yes Runell Gess, MD  CRANBERRY PO Take 1 tablet by mouth daily.   Yes Historical Provider, MD  dorzolamide (TRUSOPT) 2 % ophthalmic solution 1 drop 2 (two) times daily.  08/23/12  Yes Historical Provider, MD  finasteride (PROSCAR) 5 MG tablet Take 1 tablet (5 mg total) by mouth daily. Patient taking differently: Take 5 mg by mouth every evening.  10/13/14  Yes Albertine Grates, MD  isosorbide mononitrate (IMDUR) 30 MG 24 hr tablet Take 1 tablet (30 mg total) by mouth daily. 10/30/14  Yes Runell Gess, MD  latanoprost (XALATAN) 0.005 % ophthalmic solution Place 1 drop into both eyes at bedtime.   Yes Historical Provider, MD  levothyroxine (SYNTHROID, LEVOTHROID) 75 MCG tablet Take 75 mcg by mouth daily. 11/06/14  Yes Historical Provider, MD  losartan (COZAAR) 50 MG tablet Take 50 mg by mouth daily.   Yes Historical Provider, MD  meloxicam (MOBIC) 15 MG tablet Take 15 mg  by mouth daily. 02/17/15  Yes Historical Provider, MD  metoprolol succinate (TOPROL-XL) 50 MG 24 hr tablet Take 50 mg by mouth daily. Take with or immediately following a meal.   Yes Historical Provider, MD  QUEtiapine (SEROQUEL) 25 MG tablet Take 1 tablet (25 mg total) by mouth at bedtime. For behaviors 02/25/15  Yes Levert Feinstein, MD  ranitidine (ZANTAC) 75 MG tablet Take 75 mg by mouth daily.   Yes Historical Provider, MD  simvastatin (ZOCOR) 40 MG tablet Take 40 mg by mouth daily at 6 PM.    Yes Historical Provider, MD  timolol (TIMOPTIC) 0.5 % ophthalmic solution Place 1 drop into both eyes 2 (two) times daily.  09/18/12  Yes Historical Provider, MD  Vitamin D,  Ergocalciferol, (DRISDOL) 50000 units CAPS capsule Take 50,000 Units by mouth 2 (two) times a week.   Yes Historical Provider, MD  cephALEXin (KEFLEX) 500 MG capsule Take 1 capsule (500 mg total) by mouth 4 (four) times daily. 03/06/15   Linwood Dibbles, MD   BP 162/96 mmHg  Pulse 81  Temp(Src) 98.5 F (36.9 C) (Oral)  Resp 18  SpO2 100% Physical Exam  Constitutional: No distress.  HENT:  Head: Normocephalic and atraumatic.  Right Ear: External ear normal.  Left Ear: External ear normal.  Eyes: Conjunctivae are normal. Right eye exhibits no discharge. Left eye exhibits no discharge. No scleral icterus.  Neck: Neck supple. No tracheal deviation present.  Cardiovascular: Normal rate, regular rhythm and intact distal pulses.   Pulmonary/Chest: Effort normal and breath sounds normal. No stridor. No respiratory distress. He has no wheezes. He has no rales.  Abdominal: Soft. Bowel sounds are normal. He exhibits no distension. There is no tenderness. There is no rebound and no guarding.  Genitourinary:  No breakdown in the sacral area, the patient is incontinent of stool and there is stool in the diaper, Foley catheter is in place, there is some distal urethral irritation and skin breakdown with small amount of blood  Musculoskeletal: He exhibits no edema or tenderness.  In the left hip area there is a 3 cm very superficial area of skin breakdown that just involves the epithelial skin surface, no surrounding erythema and no purulence  Neurological: He is alert. He has normal strength. No cranial nerve deficit (no facial droop, extraocular movements intact, no slurred speech) or sensory deficit. He exhibits normal muscle tone. He displays no seizure activity. Coordination normal.  Skin: Skin is warm and dry. No rash noted.  Psychiatric: He has a normal mood and affect.  Nursing note and vitals reviewed.   ED Course  Procedures (including critical care time) Labs Review Labs Reviewed  CBC WITH  DIFFERENTIAL/PLATELET - Abnormal; Notable for the following:    RBC 3.69 (*)    Hemoglobin 10.7 (*)    HCT 32.4 (*)    All other components within normal limits  BASIC METABOLIC PANEL - Abnormal; Notable for the following:    Potassium 3.0 (*)    Glucose, Bld 104 (*)    Calcium 8.7 (*)    All other components within normal limits  URINALYSIS, ROUTINE W REFLEX MICROSCOPIC (NOT AT [facility]) - Abnormal; Notable for the following:    APPearance CLOUDY (*)    Hgb urine dipstick LARGE (*)    Protein, ur 30 (*)    Leukocytes, UA LARGE (*)    All other components within normal limits  URINE MICROSCOPIC-ADD ON - Abnormal; Notable for the following:    Squamous Epithelial /  LPF 0-5 (*)    Bacteria, UA MANY (*)    All other components within normal limits      MDM   Final diagnoses:  Skin ulceration, limited to breakdown of skin (HCC)  UTI (lower urinary tract infection)  Irritation of urethral meatus    Patient has a very superficial area of skin breakdown, there is no evidence of surrounding infection. Plan would be to apply antibiotic ointment and keep a dressing covering the wound  The patient has some mild erythema of the plantar aspect of his foot. There is scaling skin. It is possible that he may have a component of tinea pedis but no evidence of cellulitis  The patient does have some irritation the distal aspect of the catheter. The patient's caregiver states he was due to be changed soon. We will dc the old catheter and insert a new catheter.  Labs show hypokalemia.  Given a dose of potassium.  ?uti.  Drainage noted at the meatus.  Will refer to urology.  Rx abx    Linwood DibblesJon Dakari Stabler, MD 03/06/15 1537

## 2015-03-06 NOTE — Discharge Instructions (Signed)

## 2015-03-06 NOTE — ED Notes (Signed)
PTAR reporting high BP on pt prior transport, MD made aware and advised to transport pt and to have pt follow up with PCP about high BP.

## 2015-03-06 NOTE — ED Notes (Signed)
Per [facility] pt from home, per home nurse pt has skin tear appearing like pressure ulcer left buttocks and sacral area. Also reports possible penis fungus, described as growth in area. Pt alert and oriented to self , event , and place . Hx dementia. Normally bedridden . prsents with urinary catheter. Per family no urinary symptoms nor decrease  loc. Pt has no pain at this time.

## 2015-03-06 NOTE — ED Notes (Signed)
Called [facility] for transport back home  

## 2015-03-22 ENCOUNTER — Other Ambulatory Visit: Payer: Self-pay | Admitting: *Deleted

## 2015-03-22 ENCOUNTER — Telehealth: Payer: Self-pay | Admitting: Neurology

## 2015-03-22 MED ORDER — METOPROLOL SUCCINATE ER 50 MG PO TB24: ORAL_TABLET | ORAL | Status: DC

## 2015-03-22 NOTE — Telephone Encounter (Signed)
Spoke to Jeremy PoundDeborah - encouraged her speak with her family to discuss what medications he is actually being given.  I offered an early appt for him to come in, along with her sister, to discuss his medical condition and the importance of his medications.  She will talk his care over with her sister - she would like to see if they can agree on a regular medication dosing schedule.  She will call us back with any further concerns.

## 2015-03-22 NOTE — Telephone Encounter (Signed)
Daughter Clarise CruzDeborah Meadows called regarding QUEtiapine (SEROQUEL) 25 MG tablet, patient has started getting up early, being really loud, threatening to people, hallucinating again. Gavin PoundDeborah is not sure if he is actually taking his Seroquel, states the problem is with younger sister who is there in patient's home every evening, this sister doesn't think that patient should be on Seroquel.

## 2015-04-13 ENCOUNTER — Emergency Department (HOSPITAL_COMMUNITY): Payer: Medicare Other

## 2015-04-13 ENCOUNTER — Observation Stay (HOSPITAL_COMMUNITY)
Admission: EM | Admit: 2015-04-13 | Discharge: 2015-04-15 | Disposition: A | Discharge status: Home / Self Care | Payer: Medicare Other | Source: Home / Self Care | Attending: Internal Medicine | Admitting: Internal Medicine

## 2015-04-13 ENCOUNTER — Encounter (HOSPITAL_COMMUNITY): Payer: Self-pay

## 2015-04-13 DIAGNOSIS — R55 Syncope and collapse: Secondary | ICD-10-CM | POA: Insufficient documentation

## 2015-04-13 DIAGNOSIS — E860 Dehydration: Secondary | ICD-10-CM

## 2015-04-13 DIAGNOSIS — D631 Anemia in chronic kidney disease: Secondary | ICD-10-CM | POA: Diagnosis present

## 2015-04-13 DIAGNOSIS — Z87891 Personal history of nicotine dependence: Secondary | ICD-10-CM

## 2015-04-13 DIAGNOSIS — N179 Acute kidney failure, unspecified: Secondary | ICD-10-CM | POA: Insufficient documentation

## 2015-04-13 DIAGNOSIS — E039 Hypothyroidism, unspecified: Secondary | ICD-10-CM | POA: Insufficient documentation

## 2015-04-13 DIAGNOSIS — F0391 Unspecified dementia with behavioral disturbance: Secondary | ICD-10-CM | POA: Diagnosis present

## 2015-04-13 DIAGNOSIS — I959 Hypotension, unspecified: Secondary | ICD-10-CM | POA: Insufficient documentation

## 2015-04-13 DIAGNOSIS — N183 Chronic kidney disease, stage 3 unspecified: Secondary | ICD-10-CM | POA: Diagnosis present

## 2015-04-13 DIAGNOSIS — I2510 Atherosclerotic heart disease of native coronary artery without angina pectoris: Secondary | ICD-10-CM

## 2015-04-13 DIAGNOSIS — I130 Hypertensive heart and chronic kidney disease with heart failure and stage 1 through stage 4 chronic kidney disease, or unspecified chronic kidney disease: Secondary | ICD-10-CM

## 2015-04-13 DIAGNOSIS — Z7982 Long term (current) use of aspirin: Secondary | ICD-10-CM

## 2015-04-13 DIAGNOSIS — E785 Hyperlipidemia, unspecified: Secondary | ICD-10-CM | POA: Insufficient documentation

## 2015-04-13 DIAGNOSIS — R4182 Altered mental status, unspecified: Secondary | ICD-10-CM | POA: Diagnosis not present

## 2015-04-13 DIAGNOSIS — I5032 Chronic diastolic (congestive) heart failure: Secondary | ICD-10-CM

## 2015-04-13 DIAGNOSIS — N400 Enlarged prostate without lower urinary tract symptoms: Secondary | ICD-10-CM | POA: Insufficient documentation

## 2015-04-13 DIAGNOSIS — Z7902 Long term (current) use of antithrombotics/antiplatelets: Secondary | ICD-10-CM

## 2015-04-13 DIAGNOSIS — F03918 Unspecified dementia, unspecified severity, with other behavioral disturbance: Secondary | ICD-10-CM | POA: Diagnosis present

## 2015-04-13 DIAGNOSIS — Z993 Dependence on wheelchair: Secondary | ICD-10-CM | POA: Insufficient documentation

## 2015-04-13 DIAGNOSIS — I10 Essential (primary) hypertension: Secondary | ICD-10-CM | POA: Diagnosis present

## 2015-04-13 LAB — CBC WITH DIFFERENTIAL/PLATELET
BASOS PCT: 0 %
Basophils Absolute: 0 10*3/uL (ref 0.0–0.1)
EOS ABS: 0.3 10*3/uL (ref 0.0–0.7)
EOS PCT: 5 %
HEMATOCRIT: 32.6 % — AB (ref 39.0–52.0)
Hemoglobin: 10.5 g/dL — ABNORMAL LOW (ref 13.0–17.0)
Lymphocytes Relative: 30 %
Lymphs Abs: 2.1 10*3/uL (ref 0.7–4.0)
MCH: 28.5 pg (ref 26.0–34.0)
MCHC: 32.2 g/dL (ref 30.0–36.0)
MCV: 88.3 fL (ref 78.0–100.0)
MONO ABS: 0.6 10*3/uL (ref 0.1–1.0)
MONOS PCT: 8 %
Neutro Abs: 3.9 10*3/uL (ref 1.7–7.7)
Neutrophils Relative %: 57 %
Platelets: 204 10*3/uL (ref 150–400)
RBC: 3.69 MIL/uL — ABNORMAL LOW (ref 4.22–5.81)
RDW: 16.5 % — ABNORMAL HIGH (ref 11.5–15.5)
WBC: 6.8 10*3/uL (ref 4.0–10.5)

## 2015-04-13 LAB — I-STAT CHEM 8, ED
BUN: 30 mg/dL — AB (ref 6–20)
CHLORIDE: 105 mmol/L (ref 101–111)
Calcium, Ion: 1.11 mmol/L — ABNORMAL LOW (ref 1.13–1.30)
Creatinine, Ser: 1.5 mg/dL — ABNORMAL HIGH (ref 0.61–1.24)
Glucose, Bld: 90 mg/dL (ref 65–99)
HEMATOCRIT: 38 % — AB (ref 39.0–52.0)
Hemoglobin: 12.9 g/dL — ABNORMAL LOW (ref 13.0–17.0)
POTASSIUM: 4.2 mmol/L (ref 3.5–5.1)
SODIUM: 139 mmol/L (ref 135–145)
TCO2: 22 mmol/L (ref 0–100)

## 2015-04-13 LAB — I-STAT CG4 LACTIC ACID, ED: Lactic Acid, Venous: 2.24 mmol/L (ref 0.5–2.0)

## 2015-04-13 LAB — COMPREHENSIVE METABOLIC PANEL
ALBUMIN: 3.2 g/dL — AB (ref 3.5–5.0)
ALK PHOS: 61 U/L (ref 38–126)
ALT: 15 U/L — AB (ref 17–63)
AST: 37 U/L (ref 15–41)
Anion gap: 10 (ref 5–15)
BILIRUBIN TOTAL: 1.2 mg/dL (ref 0.3–1.2)
BUN: 31 mg/dL — AB (ref 6–20)
CALCIUM: 8.7 mg/dL — AB (ref 8.9–10.3)
CO2: 22 mmol/L (ref 22–32)
CREATININE: 1.74 mg/dL — AB (ref 0.61–1.24)
Chloride: 104 mmol/L (ref 101–111)
GFR calc Af Amer: 39 mL/min — ABNORMAL LOW (ref >60)
GFR calc non Af Amer: 34 mL/min — ABNORMAL LOW (ref >60)
GLUCOSE: 86 mg/dL (ref 65–99)
Potassium: 5.4 mmol/L — ABNORMAL HIGH (ref 3.5–5.1)
SODIUM: 136 mmol/L (ref 135–145)
Total Protein: 7.1 g/dL (ref 6.5–8.1)

## 2015-04-13 MED ORDER — SODIUM CHLORIDE 0.9 % IV BOLUS (SEPSIS)
1000.0000 mL | Freq: Once | INTRAVENOUS | Status: AC
  Administered 2015-04-13: 1000 mL via INTRAVENOUS

## 2015-04-13 NOTE — ED Provider Notes (Signed)
CSN: [id number]     Arrival date & time 04/13/15  2036 History   First MD Initiated Contact with Patient 04/13/15 2051     Chief Complaint  Patient presents with  . Hypotension     (Consider location/radiation/quality/duration/timing/severity/associated sxs/prior Treatment) HPI Jeremy [name] is a 82 y.o. male with PMH significant for CAD, dementia, and recent admission for sepsis 03/09/15 who presents with sudden onset, improved, now resolved hypotension approximately 7 PM this evening.  Daughter reports that he was difficult to arouse and was not responding to family members.  She reports calling EMS after about 15 minutes.  He is now at baseline.  He has 24/7 HH.  Good PO intake.  Denies CP, SOB, abdominal pain, N/V/D, bloody stools, or urinary symptoms.  He has a catheter, and daughter reports it was changed last week.  Per EMS, BP 80/40 in the field, patient placed in trendelenburg and BP improved to 121/79.  Past Medical History  Diagnosis Date  . Sepsis (HCC)   . UTI (lower urinary tract infection)   . Dementia    Past Surgical History  Procedure Laterality Date  . Coronary angioplasty with stent placement    . Thyroidectomy, partial    . Renal doppler  08/17/2005    Normal evaluation  . Cardiac catheterization  02/25/2002    Proximal L Circumflex 95% lesion, stented w/ 3x18-mm CYPHER stent avoiding the 2 L Marginal branch, jailing the 1st marginal, resulting in reduction of a 90-95% lesion to 0% residual  . Cardiovascular stress test  12/18/2006    EKG negative for ischemia, no significant ischemia demonstrated.  . Transthoracic echocardiogram  03/13/2002    EF >55%, moderate LVH,   . Back surgery     Family History  Problem Relation Age of Onset  . Family history unknown: Yes   Social History  Substance Use Topics  . Smoking status: Former Games developer  . Smokeless tobacco: Never Used     Comment: Quit 50+ years ago.  . Alcohol Use: No    Review of Systems All other  systems negative unless otherwise stated in HPI    Allergies  Tramadol  Home Medications   Prior to Admission medications   Medication Sig Start Date End Date Taking? Authorizing Provider  aspirin EC 81 MG tablet Take 81 mg by mouth daily.    Historical Provider, MD  cephALEXin (KEFLEX) 500 MG capsule Take 1 capsule (500 mg total) by mouth 4 (four) times daily. 03/06/15   Linwood Dibbles, MD  clopidogrel (PLAVIX) 75 MG tablet Take 1 tablet (75 mg total) by mouth daily. 10/30/14   Runell Gess, MD  CRANBERRY PO Take 1 tablet by mouth daily.    Historical Provider, MD  dorzolamide (TRUSOPT) 2 % ophthalmic solution 1 drop 2 (two) times daily.  08/23/12   Historical Provider, MD  finasteride (PROSCAR) 5 MG tablet Take 1 tablet (5 mg total) by mouth daily. Patient taking differently: Take 5 mg by mouth every evening.  10/13/14   Albertine Grates, MD  isosorbide mononitrate (IMDUR) 30 MG 24 hr tablet Take 1 tablet (30 mg total) by mouth daily. 10/30/14   Runell Gess, MD  latanoprost (XALATAN) 0.005 % ophthalmic solution Place 1 drop into both eyes at bedtime.    Historical Provider, MD  levothyroxine (SYNTHROID, LEVOTHROID) 75 MCG tablet Take 75 mcg by mouth daily. 11/06/14   Historical Provider, MD  losartan (COZAAR) 50 MG tablet Take 50 mg by mouth daily.  Historical Provider, MD  meloxicam (MOBIC) 15 MG tablet Take 15 mg by mouth daily. 02/17/15   Historical Provider, MD  metoprolol succinate (TOPROL-XL) 50 MG 24 hr tablet Take 25mg  (one-half tablet) once daily by mouth with or immediately following a meal. 03/22/15   Runell GessJonathan J Berry, MD  QUEtiapine (SEROQUEL) 25 MG tablet Take 1 tablet (25 mg total) by mouth at bedtime. For behaviors 02/25/15   Levert FeinsteinYijun Yan, MD  ranitidine (ZANTAC) 75 MG tablet Take 75 mg by mouth daily.    Historical Provider, MD  simvastatin (ZOCOR) 40 MG tablet Take 40 mg by mouth daily at 6 PM.     Historical Provider, MD  timolol (TIMOPTIC) 0.5 % ophthalmic solution Place 1 drop  into both eyes 2 (two) times daily.  09/18/12   Historical Provider, MD  Vitamin D, Ergocalciferol, (DRISDOL) 50000 units CAPS capsule Take 50,000 Units by mouth 2 (two) times a week.    Historical Provider, MD   BP 131/85 mmHg  Pulse 88  Temp(Src) 97.8 F (36.6 C) (Oral)  Resp 12  SpO2 100% Physical Exam  Constitutional: He is oriented to person, place, and time. He appears well-developed and well-nourished.  Non-toxic appearance. He does not have a sickly appearance. He does not appear ill.  Thin frail appearing elderly african [place] male.   HENT:  Head: Normocephalic and atraumatic.  Mouth/Throat: Oropharynx is clear and moist.  Eyes: Conjunctivae are normal. Pupils are equal, round, and reactive to light.  Neck: Normal range of motion. Neck supple.  Cardiovascular: Normal rate, regular rhythm and normal heart sounds.   No murmur heard. Pulmonary/Chest: Effort normal and breath sounds normal. No accessory muscle usage or stridor. No respiratory distress. He has no wheezes. He has no rhonchi. He has no rales.  Abdominal: Soft. Bowel sounds are normal. He exhibits no distension. There is no tenderness.  Musculoskeletal: Normal range of motion.  Lymphadenopathy:    He has no cervical adenopathy.  Neurological: He is alert and oriented to person, place, and time.  Speech clear without dysarthria.  Skin: Skin is warm and dry.  Psychiatric: He has a normal mood and affect. His behavior is normal.    ED Course  Procedures (including critical care time) Labs Review Labs Reviewed  COMPREHENSIVE METABOLIC PANEL - Abnormal; Notable for the following:    Potassium 5.4 (*)    BUN 31 (*)    Creatinine, Ser 1.74 (*)    Calcium 8.7 (*)    Albumin 3.2 (*)    ALT 15 (*)    GFR calc non Af Amer 34 (*)    GFR calc Af Amer 39 (*)    All other components within normal limits  CBC WITH DIFFERENTIAL/PLATELET - Abnormal; Notable for the following:    RBC 3.69 (*)    Hemoglobin 10.5 (*)     HCT 32.6 (*)    RDW 16.5 (*)    All other components within normal limits  I-STAT CG4 LACTIC ACID, ED - Abnormal; Notable for the following:    Lactic Acid, Venous 2.24 (*)    All other components within normal limits  CULTURE, BLOOD (ROUTINE X 2)  CULTURE, BLOOD (ROUTINE X 2)  URINE CULTURE  URINALYSIS, ROUTINE W REFLEX MICROSCOPIC (NOT AT [facility])  I-STAT CHEM 8, ED    Imaging Review No results found. I have personally reviewed and evaluated these images and lab results as part of my medical decision-making.   EKG Interpretation   Date/Time:  Tuesday April 13 2015 20:39:16 EDT Ventricular Rate:  83 PR Interval:  164 QRS Duration: 110 QT Interval:  400 QTC Calculation: 470 R Axis:   -52 Text Interpretation:  Sinus rhythm LAD, consider left anterior fascicular  block Anteroseptal infarct, old No significant change since last tracing  Confirmed by LIU MD, DANA ([id number]) on 04/13/2015 11:54:05 PM      MDM   Final diagnoses:  AKI (acute kidney injury) (HCC)  Hypotension, unspecified hypotension type  Syncope, unspecified syncope type   Patient presents with symptomatic hypotension with syncopal event. In the field, patient's blood pressure reported to be 80/40 which improved with Trendelenburg to 121/79. Patient denies any complaints at present. In the ED, he has been normotensive, afebrile, no tachycardia, or hypoxia. On exam, heart RRR, lungs CTAP, abdomen soft and benign. Does have a Foley. Labs show a lactic acid of 2.24. Patient given 1 L NS. High risk per SF syncope rule. I suspect this is related to dehydration versus sepsis (he does not meet SIRS criteria, no leukocytosis). CMP shows elevated creatinine of 1.74 from baseline of 1-1.4. Potassium 5.4. Recheck K 4.2. UA with large leukocytes, few bacteria, nitrite negative. Urine culture sent.  Patient given 1g Rocephin in ED. Plan to admit for further syncope work up and AKI.   Case has been discussed with Dr. Verdie Mosher who agrees  with the above plan for admission.       Cheri Fowler, PA-C 04/14/15 0037  Lavera Guise, MD 04/14/15 1121
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
## 2015-04-14 DIAGNOSIS — D631 Anemia in chronic kidney disease: Secondary | ICD-10-CM

## 2015-04-14 DIAGNOSIS — N400 Enlarged prostate without lower urinary tract symptoms: Secondary | ICD-10-CM | POA: Diagnosis not present

## 2015-04-14 DIAGNOSIS — N183 Chronic kidney disease, stage 3 (moderate): Secondary | ICD-10-CM | POA: Diagnosis not present

## 2015-04-14 DIAGNOSIS — I10 Essential (primary) hypertension: Secondary | ICD-10-CM

## 2015-04-14 DIAGNOSIS — R55 Syncope and collapse: Secondary | ICD-10-CM

## 2015-04-14 DIAGNOSIS — E039 Hypothyroidism, unspecified: Secondary | ICD-10-CM

## 2015-04-14 DIAGNOSIS — F0391 Unspecified dementia with behavioral disturbance: Secondary | ICD-10-CM

## 2015-04-14 DIAGNOSIS — N189 Chronic kidney disease, unspecified: Secondary | ICD-10-CM

## 2015-04-14 DIAGNOSIS — N179 Acute kidney failure, unspecified: Principal | ICD-10-CM

## 2015-04-14 LAB — BASIC METABOLIC PANEL
Anion gap: 8 (ref 5–15)
BUN: 24 mg/dL — AB (ref 6–20)
CHLORIDE: 108 mmol/L (ref 101–111)
CO2: 24 mmol/L (ref 22–32)
CREATININE: 1.37 mg/dL — AB (ref 0.61–1.24)
Calcium: 8.6 mg/dL — ABNORMAL LOW (ref 8.9–10.3)
GFR calc Af Amer: 52 mL/min — ABNORMAL LOW (ref >60)
GFR calc non Af Amer: 45 mL/min — ABNORMAL LOW (ref >60)
Glucose, Bld: 117 mg/dL — ABNORMAL HIGH (ref 65–99)
POTASSIUM: 4 mmol/L (ref 3.5–5.1)
SODIUM: 140 mmol/L (ref 135–145)

## 2015-04-14 LAB — TSH: TSH: 3.83 u[IU]/mL (ref 0.350–4.500)

## 2015-04-14 LAB — URINALYSIS, ROUTINE W REFLEX MICROSCOPIC
BILIRUBIN URINE: NEGATIVE
GLUCOSE, UA: NEGATIVE mg/dL
KETONES UR: NEGATIVE mg/dL
NITRITE: NEGATIVE
PH: 6 (ref 5.0–8.0)
Protein, ur: NEGATIVE mg/dL
SPECIFIC GRAVITY, URINE: 1.004 — AB (ref 1.005–1.030)

## 2015-04-14 LAB — CBC
HEMATOCRIT: 31.4 % — AB (ref 39.0–52.0)
HEMOGLOBIN: 10 g/dL — AB (ref 13.0–17.0)
MCH: 28 pg (ref 26.0–34.0)
MCHC: 31.8 g/dL (ref 30.0–36.0)
MCV: 88 fL (ref 78.0–100.0)
Platelets: 174 10*3/uL (ref 150–400)
RBC: 3.57 MIL/uL — AB (ref 4.22–5.81)
RDW: 16.3 % — ABNORMAL HIGH (ref 11.5–15.5)
WBC: 6.2 10*3/uL (ref 4.0–10.5)

## 2015-04-14 LAB — URINE MICROSCOPIC-ADD ON

## 2015-04-14 LAB — MRSA PCR SCREENING: MRSA by PCR: POSITIVE — AB

## 2015-04-14 LAB — TROPONIN I
TROPONIN I: 0.06 ng/mL — AB (ref <0.031)
Troponin I: <0.03 ng/mL (ref <0.031)
Troponin I: <0.03 ng/mL (ref <0.031)

## 2015-04-14 LAB — I-STAT CG4 LACTIC ACID, ED: Lactic Acid, Venous: 1.97 mmol/L (ref 0.5–2.0)

## 2015-04-14 MED ORDER — ENOXAPARIN SODIUM 40 MG/0.4ML ~~LOC~~ SOLN
40.0000 mg | SUBCUTANEOUS | Status: DC
  Administered 2015-04-14: 40 mg via SUBCUTANEOUS
  Filled 2015-04-14: qty 0.4

## 2015-04-14 MED ORDER — SODIUM CHLORIDE 0.9 % IV SOLN
Freq: Once | INTRAVENOUS | Status: AC
  Administered 2015-04-14: 04:00:00 via INTRAVENOUS

## 2015-04-14 MED ORDER — LEVOTHYROXINE SODIUM 75 MCG PO TABS
75.0000 ug | ORAL_TABLET | Freq: Every day | ORAL | Status: DC
  Administered 2015-04-14 – 2015-04-15 (×2): 75 ug via ORAL
  Filled 2015-04-14 (×2): qty 1

## 2015-04-14 MED ORDER — FAMOTIDINE 20 MG PO TABS
10.0000 mg | ORAL_TABLET | Freq: Every day | ORAL | Status: DC
  Administered 2015-04-14 – 2015-04-15 (×2): 10 mg via ORAL
  Filled 2015-04-14 (×3): qty 1

## 2015-04-14 MED ORDER — QUETIAPINE FUMARATE 25 MG PO TABS
25.0000 mg | ORAL_TABLET | Freq: Every day | ORAL | Status: DC
  Filled 2015-04-14: qty 1

## 2015-04-14 MED ORDER — CLOPIDOGREL BISULFATE 75 MG PO TABS
75.0000 mg | ORAL_TABLET | Freq: Every day | ORAL | Status: DC
  Administered 2015-04-14 – 2015-04-15 (×2): 75 mg via ORAL
  Filled 2015-04-14 (×2): qty 1

## 2015-04-14 MED ORDER — MUPIROCIN 2 % EX OINT
1.0000 "application " | TOPICAL_OINTMENT | Freq: Two times a day (BID) | CUTANEOUS | Status: DC
  Administered 2015-04-14 – 2015-04-15 (×2): 1 via NASAL
  Filled 2015-04-14: qty 22

## 2015-04-14 MED ORDER — ALBUTEROL SULFATE (2.5 MG/3ML) 0.083% IN NEBU
2.5000 mg | INHALATION_SOLUTION | RESPIRATORY_TRACT | Status: DC | PRN
Start: 2015-04-14 — End: 2015-04-15

## 2015-04-14 MED ORDER — TIMOLOL MALEATE 0.5 % OP SOLN
1.0000 [drp] | Freq: Two times a day (BID) | OPHTHALMIC | Status: DC
  Administered 2015-04-14 – 2015-04-15 (×2): 1 [drp] via OPHTHALMIC
  Filled 2015-04-14: qty 5

## 2015-04-14 MED ORDER — ACETAMINOPHEN 325 MG PO TABS
650.0000 mg | ORAL_TABLET | Freq: Four times a day (QID) | ORAL | Status: DC | PRN
Start: 2015-04-14 — End: 2015-04-15

## 2015-04-14 MED ORDER — ONDANSETRON HCL 4 MG PO TABS
4.0000 mg | ORAL_TABLET | Freq: Four times a day (QID) | ORAL | Status: DC | PRN
Start: 2015-04-14 — End: 2015-04-15

## 2015-04-14 MED ORDER — SIMVASTATIN 40 MG PO TABS
40.0000 mg | ORAL_TABLET | Freq: Every day | ORAL | Status: DC
  Administered 2015-04-14: 40 mg via ORAL
  Filled 2015-04-14: qty 1

## 2015-04-14 MED ORDER — ACETAMINOPHEN 650 MG RE SUPP: 650.0000 mg | Freq: Four times a day (QID) | RECTAL | Status: DC | PRN

## 2015-04-14 MED ORDER — METOPROLOL SUCCINATE ER 25 MG PO TB24
25.0000 mg | ORAL_TABLET | Freq: Every day | ORAL | Status: DC
  Administered 2015-04-14 – 2015-04-15 (×2): 25 mg via ORAL
  Filled 2015-04-14 (×2): qty 1

## 2015-04-14 MED ORDER — ASPIRIN EC 81 MG PO TBEC
81.0000 mg | DELAYED_RELEASE_TABLET | Freq: Every day | ORAL | Status: DC
  Administered 2015-04-14 – 2015-04-15 (×2): 81 mg via ORAL
  Filled 2015-04-14 (×2): qty 1

## 2015-04-14 MED ORDER — DORZOLAMIDE HCL 2 % OP SOLN
1.0000 [drp] | Freq: Two times a day (BID) | OPHTHALMIC | Status: DC
  Administered 2015-04-14 – 2015-04-15 (×2): 1 [drp] via OPHTHALMIC
  Filled 2015-04-14: qty 10

## 2015-04-14 MED ORDER — LATANOPROST 0.005 % OP SOLN
1.0000 [drp] | Freq: Every day | OPHTHALMIC | Status: DC
  Filled 2015-04-14: qty 2.5

## 2015-04-14 MED ORDER — FINASTERIDE 5 MG PO TABS
5.0000 mg | ORAL_TABLET | Freq: Every evening | ORAL | Status: DC
  Administered 2015-04-14: 5 mg via ORAL
  Filled 2015-04-14: qty 1

## 2015-04-14 MED ORDER — ONDANSETRON HCL 4 MG/2ML IJ SOLN: 4.0000 mg | Freq: Four times a day (QID) | INTRAMUSCULAR | Status: DC | PRN

## 2015-04-14 MED ORDER — DEXTROSE 5 % IV SOLN
1.0000 g | Freq: Once | INTRAVENOUS | Status: AC
  Administered 2015-04-14: 1 g via INTRAVENOUS
  Filled 2015-04-14: qty 10

## 2015-04-14 MED ORDER — ISOSORBIDE MONONITRATE ER 30 MG PO TB24
30.0000 mg | ORAL_TABLET | Freq: Every day | ORAL | Status: DC
  Administered 2015-04-14 – 2015-04-15 (×2): 30 mg via ORAL
  Filled 2015-04-14 (×2): qty 1

## 2015-04-14 MED ORDER — SODIUM CHLORIDE 0.9 % IV SOLN: INTRAVENOUS | Status: DC

## 2015-04-14 MED ORDER — SODIUM CHLORIDE 0.9% FLUSH
3.0000 mL | Freq: Two times a day (BID) | INTRAVENOUS | Status: DC
  Administered 2015-04-15: 3 mL via INTRAVENOUS

## 2015-04-14 MED ORDER — IPRATROPIUM BROMIDE 0.02 % IN SOLN: 0.5000 mg | RESPIRATORY_TRACT | Status: DC | PRN

## 2015-04-14 MED ORDER — CHLORHEXIDINE GLUCONATE CLOTH 2 % EX PADS
6.0000 | MEDICATED_PAD | Freq: Every day | CUTANEOUS | Status: DC
  Administered 2015-04-14: 6 via TOPICAL

## 2015-04-14 MED ORDER — LOSARTAN POTASSIUM 50 MG PO TABS
50.0000 mg | ORAL_TABLET | Freq: Every day | ORAL | Status: DC
  Administered 2015-04-14 – 2015-04-15 (×2): 50 mg via ORAL
  Filled 2015-04-14 (×2): qty 1

## 2015-04-14 NOTE — H&P (Signed)
[facility] History and Physical  [name]:[id number] DOB: August 05, 1925 DOA: 04/13/2015  Referring physician: ED PCP: [name], MD   Chief Complaint: Unresponsiveness  HPI:  Jeremy [name] is 82 year old male with past medical history significant for CAD, CHF, HTN, Hyperlipidemia, and Dementia; who was brought to the emergency department for sudden onset of unresponsiveness. Patient's history is obtained from the patient's daughter as he has dementia. Symptoms occurred around 7 PM this evening. Patient was sitting and his wheelchair at the time and was not responding normally to family members. Initially it was assessed though he was just sleeping but he was not waking up. After about 15 minutes family called EMS. Once EMS arrived and noted that his initial blood pressure was 80/40. Family notes that he has had similar episodes in the past but usually related to urinary tract infection. They report that he has a good appetite, but he is always not taking in as much fluids as he should. He denies any fever, chills, abdominal pain, shortness breath, chest pain, dark stools. He has had a chronic indwelling Foley, but it was just changed last week.   Upon patient's arrival blood pressures were seen to be within normal limits and he was started on IV fluids. Urinalysis was obtained and seen have a few bacteria, negative nitrites, large leukocyte esterase, and 6-30 white blood cells. He is given 1 dose of Rocephin while in the ED and [facility] called to admit for observation.   Review of Systems: negative for the following  Constitutional: Denies fever, chills, diaphoresis, appetite change and fatigue.  HEENT: Denies photophobia, eye pain, redness, hearing loss, ear pain, congestion, sore throat, rhinorrhea, sneezing, mouth sores, trouble swallowing, neck pain, neck stiffness and tinnitus.  Respiratory: Denies SOB, DOE, cough, chest tightness, and wheezing.  Cardiovascular:  Denies chest pain, palpitations and leg swelling.  Gastrointestinal: Denies nausea, vomiting, abdominal pain, diarrhea, constipation, blood in stool and abdominal distention.  Genitourinary: Denies dysuria, urgency, frequency, hematuria, flank pain and difficulty urinating.  Musculoskeletal: Denies myalgias, back pain, joint swelling, arthralgias and gait problem.  Skin: Denies pallor, rash and wound.  Neurological: Denies dizziness, seizures, weakness, light-headedness, numbness and headaches.  Hematological: Denies adenopathy. Easy bruising, personal or family bleeding history  Psychiatric/Behavioral: Denies suicidal ideation, mood changes, confusion, nervousness, sleep disturbance and agitation       Past Medical History  Diagnosis Date  . Sepsis (HCC)   . UTI (lower urinary tract infection)   . Dementia      Past Surgical History  Procedure Laterality Date  . Coronary angioplasty with stent placement    . Thyroidectomy, partial    . Renal doppler  08/17/2005    Normal evaluation  . Cardiac catheterization  02/25/2002    Proximal L Circumflex 95% lesion, stented w/ 3x18-mm CYPHER stent avoiding the 2 L Marginal branch, jailing the 1st marginal, resulting in reduction of a 90-95% lesion to 0% residual  . Cardiovascular stress test  12/18/2006    EKG negative for ischemia, no significant ischemia demonstrated.  . Transthoracic echocardiogram  03/13/2002    EF >55%, moderate LVH,   . Back surgery        Social History:  reports that he has quit smoking. He has never used smokeless tobacco. He reports that he does not drink alcohol or use illicit drugs.   Allergies  Allergen Reactions  . Tramadol Nausea And Vomiting    Family History  Problem Relation Age of Onset  . Family  history unknown: Yes        Prior to Admission medications   Medication Sig Start Date End Date Taking? Authorizing Provider  aspirin EC 81 MG tablet Take 81 mg by mouth daily.    Historical  Provider, MD  cephALEXin (KEFLEX) 500 MG capsule Take 1 capsule (500 mg total) by mouth 4 (four) times daily. 03/06/15   Linwood Dibbles, MD  clopidogrel (PLAVIX) 75 MG tablet Take 1 tablet (75 mg total) by mouth daily. 10/30/14   Runell Gess, MD  CRANBERRY PO Take 1 tablet by mouth daily.    Historical Provider, MD  dorzolamide (TRUSOPT) 2 % ophthalmic solution 1 drop 2 (two) times daily.  08/23/12   Historical Provider, MD  finasteride (PROSCAR) 5 MG tablet Take 1 tablet (5 mg total) by mouth daily. Patient taking differently: Take 5 mg by mouth every evening.  10/13/14   Albertine Grates, MD  isosorbide mononitrate (IMDUR) 30 MG 24 hr tablet Take 1 tablet (30 mg total) by mouth daily. 10/30/14   Runell Gess, MD  latanoprost (XALATAN) 0.005 % ophthalmic solution Place 1 drop into both eyes at bedtime.    Historical Provider, MD  levothyroxine (SYNTHROID, LEVOTHROID) 75 MCG tablet Take 75 mcg by mouth daily. 11/06/14   Historical Provider, MD  losartan (COZAAR) 50 MG tablet Take 50 mg by mouth daily.    Historical Provider, MD  meloxicam (MOBIC) 15 MG tablet Take 15 mg by mouth daily. 02/17/15   Historical Provider, MD  metoprolol succinate (TOPROL-XL) 50 MG 24 hr tablet Take 25mg  (one-half tablet) once daily by mouth with or immediately following a meal. 03/22/15   Runell Gess, MD  QUEtiapine (SEROQUEL) 25 MG tablet Take 1 tablet (25 mg total) by mouth at bedtime. For behaviors 02/25/15   Levert Feinstein, MD  ranitidine (ZANTAC) 75 MG tablet Take 75 mg by mouth daily.    Historical Provider, MD  simvastatin (ZOCOR) 40 MG tablet Take 40 mg by mouth daily at 6 PM.     Historical Provider, MD  timolol (TIMOPTIC) 0.5 % ophthalmic solution Place 1 drop into both eyes 2 (two) times daily.  09/18/12   Historical Provider, MD  Vitamin D, Ergocalciferol, (DRISDOL) 50000 units CAPS capsule Take 50,000 Units by mouth 2 (two) times a week.    Historical Provider, MD     Physical Exam: Filed Vitals:   04/13/15 2215  04/13/15 2230 04/13/15 2245 04/14/15 0141  BP: 127/80 131/85 136/94 140/82  Pulse: 85 88 88 88  Temp:    97.5 F (36.4 C)  TempSrc:    Oral  Resp: 13 12 18 18   SpO2: 99% 100% 100% 100%     Constitutional: Vital signs reviewed. Patient is a frail elderly man who is pleasant and alert and able to cooperate with exam. Head: Normocephalic and atraumatic  Ear: TM normal bilaterally  Mouth: no erythema or exudates, MMM  Eyes: PERRL, EOMI, conjunctivae normal, No scleral icterus.  Neck: Supple, Trachea midline normal ROM, No JVD, mass, thyromegaly, or carotid bruit present.  Cardiovascular: RRR, S1 normal, S2 normal, no MRG, pulses symmetric and intact bilaterally  Pulmonary/Chest: CTAB, no wheezes, rales, or rhonchi  Abdominal: Soft. Non-tender, non-distended, bowel sounds are normal, no masses, organomegaly, or guarding present.  GU: no CVA tenderness Musculoskeletal: No joint deformities, erythema, or stiffness, ROM full and no nontender Ext: no edema and no cyanosis, pulses palpable bilaterally (DP and PT)  Hematology: no cervical, inginal, or axillary adenopathy.  Neurological:  A&O, Strenght is decreased but appears to be symmetric Skin: Warm, dry and intact. No rash, cyanosis, or clubbing.  Psychiatric: Normal mood and affect. speech and behavior is normal. Patient has dementia     Data Review   Micro Results No results found for this or any previous visit (from the past 240 hour(s)).  Radiology Reports Dg Chest 2 View  04/13/2015  CLINICAL DATA:  Syncope.  Dyspnea.  Onset tonight. EXAM: CHEST  2 VIEW COMPARISON:  None. FINDINGS: There is moderate aortic tortuosity. The lungs are grossly clear. No effusions. Normal pulmonary vasculature. IMPRESSION: No acute cardiopulmonary disease. Electronically Signed   By: Ellery Plunk M.D.   On: 04/13/2015 23:47     CBC  Recent Labs Lab 04/13/15 2055 04/13/15 2337  WBC 6.8  --   HGB 10.5* 12.9*  HCT 32.6* 38.0*  PLT 204  --    MCV 88.3  --   MCH 28.5  --   MCHC 32.2  --   RDW 16.5*  --   LYMPHSABS 2.1  --   MONOABS 0.6  --   EOSABS 0.3  --   BASOSABS 0.0  --     Chemistries   Recent Labs Lab 04/13/15 2055 04/13/15 2337  NA 136 139  K 5.4* 4.2  CL 104 105  CO2 22  --   GLUCOSE 86 90  BUN 31* 30*  CREATININE 1.74* 1.50*  CALCIUM 8.7*  --   AST 37  --   ALT 15*  --   ALKPHOS 61  --   BILITOT 1.2  --    ------------------------------------------------------------------------------------------------------------------ CrCl cannot be calculated (Unknown ideal weight.). ------------------------------------------------------------------------------------------------------------------ No results for input(s): HGBA1C in the last 72 hours. ------------------------------------------------------------------------------------------------------------------ No results for input(s): CHOL, HDL, LDLCALC, TRIG, CHOLHDL, LDLDIRECT in the last 72 hours. ------------------------------------------------------------------------------------------------------------------ No results for input(s): TSH, T4TOTAL, T3FREE, THYROIDAB in the last 72 hours.  Invalid input(s): FREET3 ------------------------------------------------------------------------------------------------------------------ No results for input(s): VITAMINB12, FOLATE, FERRITIN, TIBC, IRON, RETICCTPCT in the last 72 hours.  Coagulation profile No results for input(s): INR, PROTIME in the last 168 hours.  No results for input(s): DDIMER in the last 72 hours.  Cardiac Enzymes No results for input(s): CKMB, TROPONINI, MYOGLOBIN in the last 168 hours.  Invalid input(s): CK ------------------------------------------------------------------------------------------------------------------ Invalid input(s): POCBNP   CBG: No results for input(s): GLUCAP in the last 168 hours.     EKG: Independently reviewed. Sinus rhythm with a possible  LAFB   Assessment/Plan Syncope/ unresponsiveness: Patient noted to be responsible for some period in time with initial vitals noted to be hypotensive 80/40 by EMS. Suspect patient being dehydrated because of acute symptoms versus possibility of underlying infection. Patient's last echocardiogram was in 01/2014 with EF of 55-60% and grade 1 diastolic dysfunction - Admit to telemetry bed - Check orthostatics at least laying and sitting (as patient is wheelchair-bound) - Trend troponins - IV fluids, normal saline at 100 ml/hr - Consider need for repeat echocardiogram  Transient hypotension: Resolved - Continue to monitor   Acute kidney injury on chronic kidney disease: Patient's baseline creatinine appears to be less than 1 when adequately hydrated. Creatinine elevated at 1.46 on admission, similar to previous admissions for dehydration and urinary tract infections. - IV fluids as seen above - Follow-up repeat BMP   Possible UTI: Present on arrival urinalysis showing large leukocyte esterase, 6-30 WBCs, and only a few bacteria seen. Differential diagnosis of asymptomatic bacteremia - Given 1 dose of Rocephin in the ED - Follow-up urine culture  -  May need to consider changing out the Foley catheter  CAD with history of chronic diastolic congestive heart failure. Last EF noted be 55-60% with grade 1 diastolic dysfunction. - Continue aspirin, Plavix, Imdur, metoprolol, losartan, and simvastatin - Follow-up telemetry overnight  Essential hypertension - Continue metoprolol and losartan  Hypothyroidism - Check TSH - Continue levothyroxine  Dementia: Stable - Continue to monitor  Anemia:Patient's hemoglobin was initially noted to be 12.9 platelet appears his baseline is somewhere around 10-11. - Continue to monitor  Hyperlipidemia - Continue simvastatin  BPH - continue Proscar   Lovenox DVT prophylaxis  Code Status:   full Family Communication: bedside Disposition Plan: admit    Total time spent 55 minutes.Greater than 50% of this time was spent in counseling, explanation of diagnosis, planning of further management, and coordination of care  Clydie BraunRondell A Juley Giovanetti [facility] Pager [phone]  If 7PM-7AM, please contact night-coverage [email or web address] Password [email or web address] 04/14/2015, 1:43 AM

## 2015-04-14 NOTE — Progress Notes (Signed)
[facility] PROGRESS NOTE  Jeremy [name] [name]:[id number] DOB: Feb 24, 1925 DOA: 04/13/2015 PCP: [name], MD  HPI/Brief narrative 82 year old male with past medical history significant for CAD, CHF, HTN, Hyperlipidemia, and Dementia; who was brought to the emergency department for sudden onset of unresponsiveness  Assessment/Plan: Syncope/ unresponsiveness: Patient noted to be responsible for some period in time with initial vitals noted to be hypotensive 80/40 by EMS. Patient's last echocardiogram was in 01/2014 with EF of 55-60% and grade 1 diastolic dysfunction. Patient presented clinically dehydrated and noted to be orthostatic on admission - Clinically improved much overnight after IVF hydration - Repeat orthostatics are neg  Transient hypotension: Resolved following IVF - Continue to monitor   Acute kidney injury on chronic kidney disease: Patient's baseline creatinine appears to be less than 1 when adequately hydrated. Creatinine elevated at 1.46 on admission, similar to previous admissions for dehydration and urinary tract infections. - IV fluids as seen above - Repeat Cr improved  Asymptomatic bacturia: Present on arrival urinalysis showing large leukocyte esterase, 6-30 WBCs, and only a few bacteria seen - Given 1 dose of Rocephin in the ED - Follow-up urine culture  - Likely not florid UTI. Cont to hold abx  CAD with history of chronic diastolic congestive heart failure. Last EF noted be 55-60% with grade 1 diastolic dysfunction. - Continue aspirin, Plavix, Imdur, metoprolol, losartan, and simvastatin  Essential hypertension - Continue metoprolol and losartan  Hypothyroidism - Check TSH - Continue levothyroxine  Dementia: Stable - Continue to monitor  Anemia:Patient's hemoglobin was initially noted to be 12.9 platelet appears his baseline is somewhere around 10-11. - Continue to monitor  Hyperlipidemia - Continue simvastatin  BPH - continue  Proscar  Lovenox DVT prophylaxis   Code Status: Full Family Communication: Pt in room Disposition Plan: Pt in room, family at bedside   Consultants:    Procedures:    Antibiotics: Anti-infectives    Start     Dose/Rate Route Frequency Ordered Stop   04/14/15 0045  cefTRIAXone (ROCEPHIN) 1 g in dextrose 5 % 50 mL IVPB     1 g 100 mL/hr over 30 Minutes Intravenous  Once 04/14/15 0035 04/14/15 0145      HPI/Subjective: Feels better today  Objective: Filed Vitals:   04/14/15 0141 04/14/15 0510 04/14/15 0859 04/14/15 1414  BP: 140/82 114/69 140/73   Pulse: 88 81 81   Temp: 97.5 F (36.4 C) 98.4 F (36.9 C)  97.7 F (36.5 C)  TempSrc: Oral Oral  Oral  Resp: Height:  (1.803 m)     Weight: 67.7 kg (149 lb 4 oz)     SpO2: 100% 100%  100%    Intake/Output Summary (Last 24 hours) at 04/14/15 1632 Last data filed at 04/14/15 1421  Gross per 24 hour  Intake   1000 ml  Output   2400 ml  Net  -1400 ml   Filed Weights   04/14/15 0141  Weight: 67.7 kg (149 lb 4 oz)    Exam:   General:  Awake, in nad  Cardiovascular: regular, s1, s2  Respiratory: normal resp effort, no wheezing  Abdomen: soft,nondistended  Musculoskeletal: perfused, no clubbing   Data Reviewed: Basic Metabolic Panel:  Recent Labs Lab 04/13/15 2055 04/13/15 2337 04/14/15 0256  NA 136 139 140  K 5.4* 4.2 4.0  CL 104 105 108  CO2 22  --  24  GLUCOSE 86 90 117*  BUN 31* 30* 24*  CREATININE 1.74* 1.50*  1.37*  CALCIUM 8.7*  --  8.6*   Liver Function Tests:  Recent Labs Lab 04/13/15 2055  AST 37  ALT 15*  ALKPHOS 61  BILITOT 1.2  PROT 7.1  ALBUMIN 3.2*   No results for input(s): LIPASE, AMYLASE in the last 168 hours. No results for input(s): AMMONIA in the last 168 hours. CBC:  Recent Labs Lab 04/13/15 2055 04/13/15 2337 04/14/15 0256  WBC 6.8  --  6.2  NEUTROABS 3.9  --   --   HGB 10.5* 12.9* 10.0*  HCT 32.6* 38.0* 31.4*  MCV 88.3  --  88.0   PLT 204  --  174   Cardiac Enzymes:  Recent Labs Lab 04/14/15 0256 04/14/15 0952  TROPONINI <0.03 <0.03   BNP (last 3 results) No results for input(s): BNP in the last 8760 hours.  ProBNP (last 3 results) No results for input(s): PROBNP in the last 8760 hours.  CBG: No results for input(s): GLUCAP in the last 168 hours.  Recent Results (from the past 240 hour(s))  Blood Culture (routine x 2)     Status: None (Preliminary result)   Collection Time: 04/13/15 11:13 PM  Result Value Ref Range Status   Specimen Description BLOOD LEFT ARM  Final   Special Requests IN PEDIATRIC BOTTLE  Final   Culture NO GROWTH < 24 HOURS  Final   Report Status PENDING  Incomplete  Blood Culture (routine x 2)     Status: None (Preliminary result)   Collection Time: 04/13/15 11:18 PM  Result Value Ref Range Status   Specimen Description BLOOD LEFT ARM  Final   Special Requests IN PEDIATRIC BOTTLE  Final   Culture NO GROWTH < 24 HOURS  Final   Report Status PENDING  Incomplete  MRSA PCR Screening     Status: Abnormal   Collection Time: 04/14/15  2:54 AM  Result Value Ref Range Status   MRSA by PCR POSITIVE (A) NEGATIVE Final    Comment:        The GeneXpert MRSA Assay (FDA approved for NASAL specimens only), is one component of a comprehensive MRSA colonization surveillance program. It is not intended to diagnose MRSA infection nor to guide or monitor treatment for MRSA infections. RESULT CALLED TO, READ BACK BY AND VERIFIED WITH: M RUDISILL,RN  04/14/15 MKELLY      Studies: Dg Chest 2 View  04/13/2015  CLINICAL DATA:  Syncope.  Dyspnea.  Onset tonight. EXAM: CHEST  2 VIEW COMPARISON:  None. FINDINGS: There is moderate aortic tortuosity. The lungs are grossly clear. No effusions. Normal pulmonary vasculature. IMPRESSION: No acute cardiopulmonary disease. Electronically Signed   By: Ellery Plunk M.D.   On: 04/13/2015 23:47    Scheduled Meds: . aspirin EC  81 mg  Oral Daily  . Chlorhexidine Gluconate Cloth  6 each Topical Q0600  . clopidogrel  75 mg Oral Daily  . dorzolamide  1 drop Both Eyes BID  . enoxaparin (LOVENOX) injection  40 mg Subcutaneous Q24H  . famotidine  10 mg Oral Daily  . finasteride  5 mg Oral QPM  . isosorbide mononitrate  30 mg Oral Daily  . latanoprost  1 drop Both Eyes QHS  . levothyroxine  75 mcg Oral QAC breakfast  . losartan  50 mg Oral Daily  . metoprolol succinate  25 mg Oral Daily  . mupirocin ointment  1 application Nasal BID  . QUEtiapine  25 mg Oral QHS  . simvastatin  40 mg Oral  [id number]  . sodium chloride flush  3 mL Intravenous Q12H  . timolol  1 drop Both Eyes BID   Continuous Infusions:   Principal Problem:   Syncope Active Problems:   Essential hypertension   Hypothyroidism   Dementia with behavioral disturbance   CKD (chronic kidney disease) stage 3, GFR 30-59 ml/min   Anemia of chronic renal failure, stage 3 (moderate)   BPH (benign prostatic hyperplasia)   Acute kidney injury superimposed on chronic kidney disease (HCC)    Aundre Hietala K  [facility] Pager [phone]. If 7PM-7AM, please contact night-coverage at [email or web address], password [email or web address] 04/14/2015, 4:32 PM

## 2015-04-14 NOTE — Care Management Obs Status (Signed)
MEDICARE OBSERVATION STATUS NOTIFICATION   Patient Details  Name: Jeremy CoxJames T Rutt MRN: [id number] Date of Birth: 01-Nov-1925   Medicare Observation Status Notification Given:  Yes    Lawerance Sabalebbie Tiberius Loftus, RN 04/14/2015, 11:59 AM

## 2015-04-14 NOTE — Care Management Note (Signed)
Case Management Note  Patient Details  Name: Jeremy [name] MRN: [id number] Date of Birth: [birth year]  Subjective/Objective:                 Patient in observation status for syncope, anticipate DC tomorrow. [name]. [facility] Spoke with patient and daughter at bedside, they deny any CM resources at this time.   Action/Plan:  Anticipate DC in 1 day, no CM needs identified. [facility] if patient changed to inpatient status prior to discharge.  Expected Discharge Date:                  Expected Discharge Plan:  Home w Home Health Services  In-House Referral:     Discharge planning Services  CM Consult  Post Acute Care Choice:  Resumption of Svcs/PTA Provider Choice offered to:  Patient, Adult Children  DME Arranged:    DME Agency:     HH Arranged:  RN HH Agency:  [facility]  Status of Service:  In process, will continue to follow  Medicare Important Message Given:    Date Medicare IM Given:    Medicare IM give by:    Date Additional Medicare IM Given:    Additional Medicare Important Message give by:     If discussed at Long Length of Stay Meetings, dates discussed:    Additional Comments:  Lawerance SabalDebbie Gillie Crisci, RN 04/14/2015, 12:17 PM

## 2015-04-15 ENCOUNTER — Inpatient Hospital Stay (HOSPITAL_COMMUNITY)
Admission: EM | Admit: 2015-04-15 | Discharge: 2015-04-19 | DRG: 683 | Disposition: A | Discharge status: Home Health | Payer: Medicare Other | Attending: Internal Medicine | Admitting: Internal Medicine

## 2015-04-15 ENCOUNTER — Emergency Department (HOSPITAL_COMMUNITY): Payer: Medicare Other

## 2015-04-15 ENCOUNTER — Observation Stay (HOSPITAL_COMMUNITY): Payer: Medicare Other

## 2015-04-15 ENCOUNTER — Encounter (HOSPITAL_COMMUNITY): Payer: Self-pay | Admitting: *Deleted

## 2015-04-15 DIAGNOSIS — I10 Essential (primary) hypertension: Secondary | ICD-10-CM | POA: Diagnosis not present

## 2015-04-15 DIAGNOSIS — E860 Dehydration: Secondary | ICD-10-CM | POA: Diagnosis present

## 2015-04-15 DIAGNOSIS — R55 Syncope and collapse: Secondary | ICD-10-CM | POA: Diagnosis present

## 2015-04-15 DIAGNOSIS — L8990 Pressure ulcer of unspecified site, unspecified stage: Secondary | ICD-10-CM | POA: Insufficient documentation

## 2015-04-15 DIAGNOSIS — I5032 Chronic diastolic (congestive) heart failure: Secondary | ICD-10-CM | POA: Diagnosis present

## 2015-04-15 DIAGNOSIS — T83511A Infection and inflammatory reaction due to indwelling urethral catheter, initial encounter: Secondary | ICD-10-CM

## 2015-04-15 DIAGNOSIS — I951 Orthostatic hypotension: Secondary | ICD-10-CM | POA: Diagnosis present

## 2015-04-15 DIAGNOSIS — Z7902 Long term (current) use of antithrombotics/antiplatelets: Secondary | ICD-10-CM

## 2015-04-15 DIAGNOSIS — E785 Hyperlipidemia, unspecified: Secondary | ICD-10-CM | POA: Diagnosis present

## 2015-04-15 DIAGNOSIS — F0391 Unspecified dementia with behavioral disturbance: Secondary | ICD-10-CM | POA: Diagnosis not present

## 2015-04-15 DIAGNOSIS — D631 Anemia in chronic kidney disease: Secondary | ICD-10-CM | POA: Diagnosis present

## 2015-04-15 DIAGNOSIS — N390 Urinary tract infection, site not specified: Secondary | ICD-10-CM | POA: Diagnosis present

## 2015-04-15 DIAGNOSIS — I130 Hypertensive heart and chronic kidney disease with heart failure and stage 1 through stage 4 chronic kidney disease, or unspecified chronic kidney disease: Secondary | ICD-10-CM | POA: Diagnosis present

## 2015-04-15 DIAGNOSIS — Z7982 Long term (current) use of aspirin: Secondary | ICD-10-CM

## 2015-04-15 DIAGNOSIS — N400 Enlarged prostate without lower urinary tract symptoms: Secondary | ICD-10-CM | POA: Diagnosis present

## 2015-04-15 DIAGNOSIS — Z791 Long term (current) use of non-steroidal anti-inflammatories (NSAID): Secondary | ICD-10-CM

## 2015-04-15 DIAGNOSIS — N183 Chronic kidney disease, stage 3 (moderate): Secondary | ICD-10-CM | POA: Diagnosis not present

## 2015-04-15 DIAGNOSIS — N179 Acute kidney failure, unspecified: Principal | ICD-10-CM | POA: Diagnosis present

## 2015-04-15 DIAGNOSIS — Z955 Presence of coronary angioplasty implant and graft: Secondary | ICD-10-CM

## 2015-04-15 DIAGNOSIS — I2510 Atherosclerotic heart disease of native coronary artery without angina pectoris: Secondary | ICD-10-CM | POA: Diagnosis present

## 2015-04-15 DIAGNOSIS — E039 Hypothyroidism, unspecified: Secondary | ICD-10-CM | POA: Diagnosis present

## 2015-04-15 DIAGNOSIS — Z79899 Other long term (current) drug therapy: Secondary | ICD-10-CM

## 2015-04-15 DIAGNOSIS — Z993 Dependence on wheelchair: Secondary | ICD-10-CM

## 2015-04-15 DIAGNOSIS — Z87891 Personal history of nicotine dependence: Secondary | ICD-10-CM

## 2015-04-15 DIAGNOSIS — R531 Weakness: Secondary | ICD-10-CM

## 2015-04-15 HISTORY — DX: Syncope and collapse: R55

## 2015-04-15 LAB — URINALYSIS, ROUTINE W REFLEX MICROSCOPIC
Glucose, UA: NEGATIVE mg/dL
KETONES UR: 15 mg/dL — AB
NITRITE: POSITIVE — AB
Protein, ur: 30 mg/dL — AB
Specific Gravity, Urine: 1.02 (ref 1.005–1.030)
pH: 7 (ref 5.0–8.0)

## 2015-04-15 LAB — BASIC METABOLIC PANEL
ANION GAP: 11 (ref 5–15)
Anion gap: 10 (ref 5–15)
BUN: 15 mg/dL (ref 6–20)
BUN: 15 mg/dL (ref 6–20)
CALCIUM: 8.6 mg/dL — AB (ref 8.9–10.3)
CALCIUM: 8.6 mg/dL — AB (ref 8.9–10.3)
CHLORIDE: 107 mmol/L (ref 101–111)
CO2: 23 mmol/L (ref 22–32)
CO2: 21 mmol/L — ABNORMAL LOW (ref 22–32)
Chloride: 108 mmol/L (ref 101–111)
Creatinine, Ser: 0.98 mg/dL (ref 0.61–1.24)
Creatinine, Ser: 0.95 mg/dL (ref 0.61–1.24)
GFR calc Af Amer: >60 mL/min (ref >60)
GLUCOSE: 85 mg/dL (ref 65–99)
GLUCOSE: 90 mg/dL (ref 65–99)
Potassium: 4.1 mmol/L (ref 3.5–5.1)
Potassium: 3.7 mmol/L (ref 3.5–5.1)
Sodium: 140 mmol/L (ref 135–145)
Sodium: 140 mmol/L (ref 135–145)

## 2015-04-15 LAB — CBC
HCT: 32.8 % — ABNORMAL LOW (ref 39.0–52.0)
HCT: 33.3 % — ABNORMAL LOW (ref 39.0–52.0)
Hemoglobin: 10.3 g/dL — ABNORMAL LOW (ref 13.0–17.0)
Hemoglobin: 10.6 g/dL — ABNORMAL LOW (ref 13.0–17.0)
MCH: 27.9 pg (ref 26.0–34.0)
MCH: 28 pg (ref 26.0–34.0)
MCHC: 31.4 g/dL (ref 30.0–36.0)
MCHC: 31.8 g/dL (ref 30.0–36.0)
MCV: 88.9 fL (ref 78.0–100.0)
MCV: 88.1 fL (ref 78.0–100.0)
PLATELETS: 188 10*3/uL (ref 150–400)
Platelets: 176 10*3/uL (ref 150–400)
RBC: 3.69 MIL/uL — ABNORMAL LOW (ref 4.22–5.81)
RBC: 3.78 MIL/uL — ABNORMAL LOW (ref 4.22–5.81)
RDW: 16.4 % — AB (ref 11.5–15.5)
RDW: 16.6 % — AB (ref 11.5–15.5)
WBC: 5.7 10*3/uL (ref 4.0–10.5)
WBC: 5.9 10*3/uL (ref 4.0–10.5)

## 2015-04-15 LAB — TROPONIN I

## 2015-04-15 LAB — URINE MICROSCOPIC-ADD ON

## 2015-04-15 LAB — CREATININE, SERUM
Creatinine, Ser: 0.9 mg/dL (ref 0.61–1.24)
GFR calc Af Amer: >60 mL/min (ref >60)
GFR calc non Af Amer: >60 mL/min (ref >60)

## 2015-04-15 LAB — POC OCCULT BLOOD, ED: Fecal Occult Bld: NEGATIVE

## 2015-04-15 LAB — PHOSPHORUS: Phosphorus: 2.4 mg/dL — ABNORMAL LOW (ref 2.5–4.6)

## 2015-04-15 LAB — MAGNESIUM: Magnesium: 1.7 mg/dL (ref 1.7–2.4)

## 2015-04-15 MED ORDER — LOSARTAN POTASSIUM 50 MG PO TABS
50.0000 mg | ORAL_TABLET | Freq: Every day | ORAL | Status: DC
  Administered 2015-04-16 – 2015-04-19 (×4): 50 mg via ORAL
  Filled 2015-04-15 (×5): qty 1

## 2015-04-15 MED ORDER — QUETIAPINE FUMARATE 25 MG PO TABS
25.0000 mg | ORAL_TABLET | Freq: Every day | ORAL | Status: DC
  Administered 2015-04-15 – 2015-04-18 (×4): 25 mg via ORAL
  Filled 2015-04-15 (×4): qty 1

## 2015-04-15 MED ORDER — CLOPIDOGREL BISULFATE 75 MG PO TABS
75.0000 mg | ORAL_TABLET | Freq: Every day | ORAL | Status: DC
  Administered 2015-04-16 – 2015-04-19 (×4): 75 mg via ORAL
  Filled 2015-04-15 (×5): qty 1

## 2015-04-15 MED ORDER — LEVOTHYROXINE SODIUM 75 MCG PO TABS
75.0000 ug | ORAL_TABLET | Freq: Every day | ORAL | Status: DC
Start: 2015-04-16 — End: 2015-04-19
  Administered 2015-04-16 – 2015-04-19 (×4): 75 ug via ORAL
  Filled 2015-04-15 (×4): qty 1

## 2015-04-15 MED ORDER — SODIUM CHLORIDE 0.9% FLUSH
3.0000 mL | Freq: Two times a day (BID) | INTRAVENOUS | Status: DC
  Administered 2015-04-15 – 2015-04-19 (×7): 3 mL via INTRAVENOUS

## 2015-04-15 MED ORDER — ISOSORBIDE MONONITRATE ER 30 MG PO TB24
30.0000 mg | ORAL_TABLET | Freq: Every day | ORAL | Status: DC
Start: 2015-04-15 — End: 2015-04-19
  Administered 2015-04-16 – 2015-04-19 (×4): 30 mg via ORAL
  Filled 2015-04-15 (×5): qty 1

## 2015-04-15 MED ORDER — SODIUM CHLORIDE 0.9 % IV SOLN
INTRAVENOUS | Status: DC
  Administered 2015-04-15: 23:00:00 via INTRAVENOUS

## 2015-04-15 MED ORDER — ONDANSETRON HCL 4 MG/2ML IJ SOLN: 4.0000 mg | Freq: Four times a day (QID) | INTRAMUSCULAR | Status: DC | PRN

## 2015-04-15 MED ORDER — VITAMIN D (ERGOCALCIFEROL) 1.25 MG (50000 UNIT) PO CAPS
50000.0000 [IU] | ORAL_CAPSULE | ORAL | Status: DC
  Administered 2015-04-15 – 2015-04-19 (×2): 50000 [IU] via ORAL
  Filled 2015-04-15 (×2): qty 1

## 2015-04-15 MED ORDER — SIMVASTATIN 40 MG PO TABS
40.0000 mg | ORAL_TABLET | Freq: Every day | ORAL | Status: DC
  Administered 2015-04-16 – 2015-04-18 (×3): 40 mg via ORAL
  Filled 2015-04-15 (×4): qty 1

## 2015-04-15 MED ORDER — MELOXICAM 7.5 MG PO TABS
15.0000 mg | ORAL_TABLET | Freq: Every day | ORAL | Status: DC
  Administered 2015-04-16 – 2015-04-19 (×4): 15 mg via ORAL
  Filled 2015-04-15: qty 2
  Filled 2015-04-15: qty 1
  Filled 2015-04-15 (×2): qty 2
  Filled 2015-04-15: qty 1

## 2015-04-15 MED ORDER — FINASTERIDE 5 MG PO TABS
5.0000 mg | ORAL_TABLET | Freq: Every day | ORAL | Status: DC
  Administered 2015-04-16 – 2015-04-19 (×4): 5 mg via ORAL
  Filled 2015-04-15 (×5): qty 1

## 2015-04-15 MED ORDER — FAMOTIDINE 20 MG PO TABS
20.0000 mg | ORAL_TABLET | Freq: Every day | ORAL | Status: DC
  Administered 2015-04-16 – 2015-04-19 (×4): 20 mg via ORAL
  Filled 2015-04-15 (×5): qty 1

## 2015-04-15 MED ORDER — ASPIRIN EC 81 MG PO TBEC
81.0000 mg | DELAYED_RELEASE_TABLET | Freq: Every day | ORAL | Status: DC
  Administered 2015-04-16 – 2015-04-19 (×4): 81 mg via ORAL
  Filled 2015-04-15 (×5): qty 1

## 2015-04-15 MED ORDER — METOPROLOL SUCCINATE ER 25 MG PO TB24
25.0000 mg | ORAL_TABLET | Freq: Every day | ORAL | Status: DC
  Administered 2015-04-16 – 2015-04-19 (×4): 25 mg via ORAL
  Filled 2015-04-15 (×5): qty 1

## 2015-04-15 MED ORDER — PIPERACILLIN-TAZOBACTAM 3.375 G IVPB
3.3750 g | Freq: Three times a day (TID) | INTRAVENOUS | Status: DC
  Administered 2015-04-15 – 2015-04-17 (×6): 3.375 g via INTRAVENOUS
  Filled 2015-04-15 (×7): qty 50

## 2015-04-15 MED ORDER — TIMOLOL MALEATE 0.5 % OP SOLN
1.0000 [drp] | Freq: Two times a day (BID) | OPHTHALMIC | Status: DC
  Administered 2015-04-15 – 2015-04-19 (×8): 1 [drp] via OPHTHALMIC
  Filled 2015-04-15: qty 5

## 2015-04-15 MED ORDER — ONDANSETRON HCL 4 MG PO TABS: 4.0000 mg | ORAL_TABLET | Freq: Four times a day (QID) | ORAL | Status: DC | PRN

## 2015-04-15 MED ORDER — DORZOLAMIDE HCL 2 % OP SOLN
1.0000 [drp] | Freq: Two times a day (BID) | OPHTHALMIC | Status: DC
  Administered 2015-04-15 – 2015-04-19 (×8): 1 [drp] via OPHTHALMIC
  Filled 2015-04-15: qty 10

## 2015-04-15 MED ORDER — LATANOPROST 0.005 % OP SOLN
1.0000 [drp] | Freq: Every day | OPHTHALMIC | Status: DC
  Administered 2015-04-15 – 2015-04-18 (×4): 1 [drp] via OPHTHALMIC
  Filled 2015-04-15: qty 2.5

## 2015-04-15 MED ORDER — ENOXAPARIN SODIUM 40 MG/0.4ML ~~LOC~~ SOLN
40.0000 mg | SUBCUTANEOUS | Status: DC
  Administered 2015-04-16 – 2015-04-18 (×3): 40 mg via SUBCUTANEOUS
  Filled 2015-04-15 (×4): qty 0.4

## 2015-04-15 NOTE — ED Notes (Signed)
Pt was discharged from the hospital today and was at home and family was taking him to the table to have lunch when he had a syncopal episode, was lowered to the ground by family.  EMS found him with shallow respiration and barely palpable carotid pulse 67/32 BP.  Pt was bagged by ems until he regained consciousness.  Pt was hospitalized 4/11-4/13 for syncope.  Pt is A&O and denies any CP or sob.

## 2015-04-15 NOTE — ED Provider Notes (Signed)
CSN: [id number]     Arrival date & time 04/15/15  1446 History   First MD Initiated Contact with Patient 04/15/15 1456     Chief Complaint  Patient presents with  . Loss of Consciousness     (Consider location/radiation/quality/duration/timing/severity/associated sxs/prior Treatment) HPI Level 5 caveat for dementia 82 y.o. male with a history of dementia and admission from 4/11-4/13 for syncope, discharged earlier today returns to the emergency department after he had another syncopal episode while sitting at the table once he got home. This occurred while the patient was eating and drinking having lunch. He again denied any preceding chest pain, shortness of breath or nausea, vomiting, lightheadedness or dizziness. No observed seizure activity or incontinence or tongue biting. This syncopal episode was witnessed by his daughter who is present at the bedside and states that she helped guide him gently down to the floor and found to be significantly altered and unresponsive with shallow breathing and barely palpable pulse. EMS was called and advised to start bystander CPR. On arrival by EMS the patient was found to have a pulse but was hypotensive to 67/32. The patient was bagged by EMS and gradually the patient regained consciousness. He was then transported directly to the ED for further evaluation. On arrival the patient states that he feels tired but denies any other significant symptoms. While admitted previously his treatment consisted of IV fluids and laboratory evaluation. He had no echo evaluation during this evaluation.   Past Medical History  Diagnosis Date  . Sepsis (HCC)   . UTI (lower urinary tract infection)   . Dementia   . Syncope     4/11 (hospitalized) and 4/13   Past Surgical History  Procedure Laterality Date  . Coronary angioplasty with stent placement    . Thyroidectomy, partial    . Renal doppler  08/17/2005    Normal evaluation  . Cardiac catheterization  02/25/2002     Proximal L Circumflex 95% lesion, stented w/ 3x18-mm CYPHER stent avoiding the 2 L Marginal branch, jailing the 1st marginal, resulting in reduction of a 90-95% lesion to 0% residual  . Cardiovascular stress test  12/18/2006    EKG negative for ischemia, no significant ischemia demonstrated.  . Transthoracic echocardiogram  03/13/2002    EF >55%, moderate LVH,   . Back surgery     Family History  Problem Relation Age of Onset  . Family history unknown: Yes   Social History  Substance Use Topics  . Smoking status: Former Games developermoker  . Smokeless tobacco: Never Used     Comment: Quit 50+ years ago.  . Alcohol Use: No    Review of Systems  Unable to perform ROS: Dementia  Constitutional: Positive for activity change and fatigue. Negative for fever.  Respiratory: Negative for chest tightness and shortness of breath.   Cardiovascular: Negative for chest pain.  Neurological: Positive for syncope. Negative for seizures.      Allergies  Tramadol  Home Medications   Prior to Admission medications   Medication Sig Start Date End Date Taking? Authorizing Provider  aspirin EC 81 MG tablet Take 81 mg by mouth daily.   Yes Historical Provider, MD  clopidogrel (PLAVIX) 75 MG tablet Take 1 tablet (75 mg total) by mouth daily. 10/30/14  Yes Runell GessJonathan J Berry, MD  CRANBERRY PO Take 1 tablet by mouth daily.   Yes Historical Provider, MD  dorzolamide (TRUSOPT) 2 % ophthalmic solution Place 1 drop into the right eye 2 (two) times daily.  08/23/12  Yes Historical Provider, MD  finasteride (PROSCAR) 5 MG tablet Take 1 tablet (5 mg total) by mouth daily. Patient taking differently: Take 5 mg by mouth every evening.  10/13/14  Yes Albertine Grates, MD  isosorbide mononitrate (IMDUR) 30 MG 24 hr tablet Take 1 tablet (30 mg total) by mouth daily. 10/30/14  Yes Runell Gess, MD  latanoprost (XALATAN) 0.005 % ophthalmic solution Place 1 drop into both eyes at bedtime.   Yes Historical Provider, MD   levothyroxine (SYNTHROID, LEVOTHROID) 75 MCG tablet Take 75 mcg by mouth daily. 11/06/14  Yes Historical Provider, MD  losartan (COZAAR) 50 MG tablet Take 50 mg by mouth daily.   Yes Historical Provider, MD  meloxicam (MOBIC) 15 MG tablet Take 15 mg by mouth daily. 02/17/15  Yes Historical Provider, MD  metoprolol succinate (TOPROL-XL) 50 MG 24 hr tablet Take 25mg  (one-half tablet) once daily by mouth with or immediately following a meal. Patient taking differently: Take 50 mg by mouth daily. Take 25mg  (one-half tablet) once daily by mouth with or immediately following a meal. 03/22/15  Yes Runell Gess, MD  QUEtiapine (SEROQUEL) 25 MG tablet Take 1 tablet (25 mg total) by mouth at bedtime. For behaviors 02/25/15  Yes Levert Feinstein, MD  ranitidine (ZANTAC) 75 MG tablet Take 75 mg by mouth daily.   Yes Historical Provider, MD  simvastatin (ZOCOR) 40 MG tablet Take 40 mg by mouth daily at 6 PM.    Yes Historical Provider, MD  timolol (TIMOPTIC) 0.5 % ophthalmic solution Place 1 drop into both eyes 2 (two) times daily.  09/18/12  Yes Historical Provider, MD  Vitamin D, Ergocalciferol, (DRISDOL) 50000 units CAPS capsule Take 50,000 Units by mouth 2 (two) times a week.   Yes Historical Provider, MD   BP 144/90 mmHg  Pulse 69  Temp(Src) 98.6 F (37 C) (Oral)  Resp 16  Ht 6' (1.829 m)  Wt 75.07 kg  BMI 22.44 kg/m2  SpO2 100% Physical Exam  Constitutional: He is oriented to person, place, and time. He appears lethargic. He appears cachectic. He is cooperative. He is easily aroused. No distress.  HENT:  Head: Normocephalic and atraumatic.  Nose: Nose normal.  Mouth/Throat: Oropharynx is clear and moist.  Eyes: Conjunctivae and EOM are normal. Pupils are equal, round, and reactive to light.  Neck: Neck supple.  Cardiovascular: Normal rate, regular rhythm, normal heart sounds and intact distal pulses.   Pulmonary/Chest: Effort normal and breath sounds normal.  Abdominal: Soft. He exhibits no  distension. There is no tenderness.  Musculoskeletal: He exhibits no edema or tenderness.  Neurological: He is oriented to person, place, and time and easily aroused. He appears lethargic. No cranial nerve deficit. Coordination normal.  Skin: Skin is warm and dry. No rash noted. He is not diaphoretic.  Nursing note and vitals reviewed.   ED Course  Procedures (including critical care time) Labs Review Labs Reviewed  BASIC METABOLIC PANEL - Abnormal; Notable for the following:    CO2 21 (*)    Calcium 8.6 (*)    All other components within normal limits  CBC - Abnormal; Notable for the following:    RBC 3.69 (*)    Hemoglobin 10.3 (*)    HCT 32.8 (*)    RDW 16.4 (*)    All other components within normal limits  URINALYSIS, ROUTINE W REFLEX MICROSCOPIC (NOT AT [facility]) - Abnormal; Notable for the following:    Color, Urine AMBER (*)    APPearance  CLOUDY (*)    Hgb urine dipstick TRACE (*)    Bilirubin Urine SMALL (*)    Ketones, ur 15 (*)    Protein, ur 30 (*)    Nitrite POSITIVE (*)    Leukocytes, UA LARGE (*)    All other components within normal limits  URINE MICROSCOPIC-ADD ON - Abnormal; Notable for the following:    Squamous Epithelial / LPF 0-5 (*)    Bacteria, UA MANY (*)    Casts HYALINE CASTS (*)    All other components within normal limits  CBC - Abnormal; Notable for the following:    RBC 3.78 (*)    Hemoglobin 10.6 (*)    HCT 33.3 (*)    RDW 16.6 (*)    All other components within normal limits  PHOSPHORUS - Abnormal; Notable for the following:    Phosphorus 2.4 (*)    All other components within normal limits  TROPONIN I  CREATININE, SERUM  MAGNESIUM  BASIC METABOLIC PANEL  CBC  CBG MONITORING, ED  POC OCCULT BLOOD, ED    Imaging Review Dg Chest 2 View  04/15/2015  CLINICAL DATA:  Syncope EXAM: CHEST  2 VIEW COMPARISON:  04/13/2015 FINDINGS: Heart size and vascularity normal. Negative for heart failure. Negative for pneumonia. No mass lesion. Minimal  right pleural effusion. IMPRESSION: Minimal right pleural effusion.  Negative for pneumonia. Electronically Signed   By: Marlan Palau M.D.   On: 04/15/2015 16:17   Ct Chest Wo Contrast  04/15/2015  CLINICAL DATA:  Acute onset of syncope and generalized weakness. Initial encounter. EXAM: CT CHEST WITHOUT CONTRAST TECHNIQUE: Multidetector CT imaging of the chest was performed following the standard protocol without IV contrast. COMPARISON:  Chest radiograph performed earlier today at 3:59 p.m. FINDINGS: Minimal bilateral atelectasis is noted. The lungs are otherwise clear. No pleural effusion or pneumothorax is seen. No masses are identified. Trace pericardial fluid remains within normal limits. A coronary artery stent is noted. The mediastinum is unremarkable in appearance. No mediastinal lymphadenopathy is seen. The great vessels are grossly unremarkable in appearance. The thyroid gland is not well assessed. No axillary lymphadenopathy is seen. The visualized portions of the liver and the spleen are unremarkable. The gallbladder is not well assessed. The visualized portions of the pancreas, adrenal glands and kidneys are grossly unremarkable. Mild nonspecific perinephric stranding is noted bilaterally. No acute osseous abnormalities are seen. Mild degenerative change is noted at the lower cervical spine. IMPRESSION: 1. No acute abnormality seen to explain the patient's symptoms. 2. Minimal bilateral atelectasis noted.  Lungs otherwise clear. Electronically Signed   By: Roanna Raider M.D.   On: 04/15/2015 19:21   I have personally reviewed and evaluated these images and lab results as part of my medical decision-making.   EKG Interpretation   Date/Time:  Thursday April 15 2015 14:54:58 EDT Ventricular Rate:  77 PR Interval:  160 QRS Duration: 111 QT Interval:  405 QTC Calculation: 458 R Axis:   -59 Text Interpretation:  Sinus rhythm Atrial premature complex Incomplete  left bundle branch block  Low voltage, precordial leads Anterior Q waves,  possibly due to ILBBB No significant change was found Confirmed by Manus Gunning   MD, STEPHEN [phone]) on 04/15/2015 3:02:36 PM Also confirmed by Manus Gunning   MD, STEPHEN [phone]), editor Dan Humphreys, CCT, SANDRA ([id number])  on 04/15/2015  3:07:48 PM      MDM  82 y.o. male who was just admitted for syncopal episode, treated overnight for dehydration, and discharged today returns  to the emergency department after he had another syncopal episode shortly after returning home. On arrival the patient is afebrile with vital signs stable initially hypertensive and then returning to a normal blood pressure. Physical exam, as above, frail-appearing elderly gentleman who is lethargic but arousable. Laboratory evaluation returned reassuringly with mild anemia, similar to prior. UA returned showing positive nitrates and many bacteria, with indwelling Foley catheter feel this is likely asymptomatic bacteriuria. He remained hemodynamically stable while in the ED. He was then admitted to the hospitalist for further syncope workup.   Final diagnoses:  Weakness       Francoise Ceo, DO 04/16/15 [id number]  Doug Sou, MD 04/17/15 [phone]

## 2015-04-15 NOTE — ED Notes (Signed)
CBG for EMS was 97

## 2015-04-15 NOTE — Progress Notes (Signed)
[facility]

## 2015-04-15 NOTE — H&P (Signed)
[facility] History and Physical  Jeremy [name] AVW:[id number] DOB: 04-Oct-1925 DOA: 04/15/2015  Referring physician: ED PA Fayrene Fearing  PCP: [name], MD   Chief Complaint: syncope   HPI:  82 year old male with past medical history significant for CAD, CHF, HTN, Hyperlipidemia, and Dementia; who was brought to the emergency department several hours after he was discharged form the hospital for evaluation of episodes of passing out. [name] explains this why he was hospitalized from 4/11 - 4/12 and he seemed like he was doing well initially but when he got home he felt somewhat weeks and while sitting at the dinner table he suddenly passed out. Pt explains he still feels weak but no specific concerns such as chest pain or shortness of breath, no abd concerns. His daughter says urine smelled bed. No fevers or chills.   In ED, pt noted to be hemodynamically stable, VSS, one BP reading was 178/92 but repeat 119/84, initial oxygen sat 73% and pt placed on O2 via [place] and saturation at the time of the admission up to 100%. UA suggestive of UTI. Urine culture results back today (from last hospital stay) with Pseudomonas and with no final sensitivity report. TRH asked to admit for further evaluation.   Assessment and Plan: Active Problems:   Syncope and collapse - unclear etiology - no clear evidence of acute cardiopulmonary event - ? UTI  - will admit to telemetry unit - place on Zosyn for now and follow up on urine culture final report - also check orthostatic vitals as the one in ED suggestive of orthostatic hypotension - ECHO requested    Oxygen desaturation - some dullness to percussion at the bilateral lower lobes with R pleural effusion - CT chest requested - keep on O2    Essential HTN - continue home medical regimen     UTI - place on Zosyn, follow up on urine culture   Lovenox SQ for DVT prophylaxis   Radiological Exams on Admission: Dg Chest 2 View  04/15/2015   CLINICAL DATA:  Syncope EXAM: CHEST  2 VIEW COMPARISON:  04/13/2015 FINDINGS: Heart size and vascularity normal. Negative for heart failure. Negative for pneumonia. No mass lesion. Minimal right pleural effusion. IMPRESSION: Minimal right pleural effusion.  Negative for pneumonia. Electronically Signed   By: Marlan Palau M.D.   On: 04/15/2015 16:17   Dg Chest 2 View  04/13/2015  CLINICAL DATA:  Syncope.  Dyspnea.  Onset tonight. EXAM: CHEST  2 VIEW COMPARISON:  None. FINDINGS: There is moderate aortic tortuosity. The lungs are grossly clear. No effusions. Normal pulmonary vasculature. IMPRESSION: No acute cardiopulmonary disease. Electronically Signed   By: Ellery Plunk M.D.   On: 04/13/2015 23:47     Code Status: Full Family Communication: Pt and daughter at bedside Disposition Plan: Admit for further evaluation    Jeremy [name] [facility] [phone]   Review of Systems:  Constitutional: Negative for fever, chills . Negative for diaphoresis.  HENT: Negative for hearing loss, ear pain, nosebleeds, congestion, sore throat, neck pain, tinnitus and ear discharge.   Eyes: Negative for blurred vision, double vision, photophobia, pain, discharge and redness.  Respiratory: Negative for cough, hemoptysis, sputum production, shortness of breath, wheezing and stridor.   Cardiovascular: Negative for chest pain, palpitations, orthopnea, claudication and leg swelling.  Gastrointestinal: Negative for nausea, vomiting and abdominal pain.  Genitourinary: Negative for hematuria and flank pain.  Musculoskeletal: Negative for myalgias, back pain, joint pain and falls.  Skin: Negative for itching and rash.  Neurological: Negative for tingling, tremors, sensory change, speech change, focal weakness Endo/Heme/Allergies: Negative for environmental allergies and polydipsia. Does not bruise/bleed easily.  Psychiatric/Behavioral: Negative for suicidal ideas. The patient is not nervous/anxious.      Past Medical  History  Diagnosis Date  . Sepsis (HCC)   . UTI (lower urinary tract infection)   . Dementia   . Syncope     4/11 (hospitalized) and 4/13    Past Surgical History  Procedure Laterality Date  . Coronary angioplasty with stent placement    . Thyroidectomy, partial    . Renal doppler  08/17/2005    Normal evaluation  . Cardiac catheterization  02/25/2002    Proximal L Circumflex 95% lesion, stented w/ 3x18-mm CYPHER stent avoiding the 2 L Marginal branch, jailing the 1st marginal, resulting in reduction of a 90-95% lesion to 0% residual  . Cardiovascular stress test  12/18/2006    EKG negative for ischemia, no significant ischemia demonstrated.  . Transthoracic echocardiogram  03/13/2002    EF >55%, moderate LVH,   . Back surgery      Social History:  reports that he has quit smoking. He has never used smokeless tobacco. He reports that he does not drink alcohol or use illicit drugs.  Allergies  Allergen Reactions  . Tramadol Nausea And Vomiting    No family history of heart disease   Prior to Admission medications   Medication Sig Start Date End Date Taking? Authorizing Provider  aspirin EC 81 MG tablet Take 81 mg by mouth daily.   Yes Historical Provider, MD  clopidogrel (PLAVIX) 75 MG tablet Take 1 tablet (75 mg total) by mouth daily. 10/30/14  Yes Runell GessJonathan J Berry, MD  CRANBERRY PO Take 1 tablet by mouth daily.   Yes Historical Provider, MD  dorzolamide (TRUSOPT) 2 % ophthalmic solution Place 1 drop into the right eye 2 (two) times daily.  08/23/12  Yes Historical Provider, MD  finasteride (PROSCAR) 5 MG tablet Take 1 tablet (5 mg total) by mouth daily. Patient taking differently: Take 5 mg by mouth every evening.  10/13/14  Yes Albertine GratesFang Xu, MD  isosorbide mononitrate (IMDUR) 30 MG 24 hr tablet Take 1 tablet (30 mg total) by mouth daily. 10/30/14  Yes Runell GessJonathan J Berry, MD  latanoprost (XALATAN) 0.005 % ophthalmic solution Place 1 drop into both eyes at bedtime.   Yes Historical  Provider, MD  levothyroxine (SYNTHROID, LEVOTHROID) 75 MCG tablet Take 75 mcg by mouth daily. 11/06/14  Yes Historical Provider, MD  losartan (COZAAR) 50 MG tablet Take 50 mg by mouth daily.   Yes Historical Provider, MD  meloxicam (MOBIC) 15 MG tablet Take 15 mg by mouth daily. 02/17/15  Yes Historical Provider, MD  metoprolol succinate (TOPROL-XL) 50 MG 24 hr tablet Take 25mg  (one-half tablet) once daily by mouth with or immediately following a meal. Patient taking differently: Take 50 mg by mouth daily. Take 25mg  (one-half tablet) once daily by mouth with or immediately following a meal. 03/22/15  Yes Runell GessJonathan J Berry, MD  QUEtiapine (SEROQUEL) 25 MG tablet Take 1 tablet (25 mg total) by mouth at bedtime. For behaviors 02/25/15  Yes Levert FeinsteinYijun Yan, MD  ranitidine (ZANTAC) 75 MG tablet Take 75 mg by mouth daily.   Yes Historical Provider, MD  simvastatin (ZOCOR) 40 MG tablet Take 40 mg by mouth daily at 6 PM.    Yes Historical Provider, MD  timolol (TIMOPTIC) 0.5 % ophthalmic solution Place 1 drop into both eyes 2 (two) times  daily.  09/18/12  Yes Historical Provider, MD  Vitamin D, Ergocalciferol, (DRISDOL) 50000 units CAPS capsule Take 50,000 Units by mouth 2 (two) times a week.   Yes Historical Provider, MD    Physical Exam: Filed Vitals:   04/15/15 1455 04/15/15 1700 04/15/15 1800  BP: 140/90 119/84 124/84  Pulse: 69 77 75  Temp: 97.9 F (36.6 C)    TempSrc: Oral    Resp: Weight: 67.586 kg (149 lb)    SpO2: 100% 100% 100%    Physical Exam  Constitutional: Appears tired, cold and clammy skin  HENT: Normocephalic. External right and left ear normal. Dry MM Eyes: Conjunctivae and EOM are normal. PERRLA, no scleral icterus.  Neck: Normal ROM. Neck supple. No JVD. No tracheal deviation. No thyromegaly.  CVS: RRR, S1/S2 +, no murmurs, no gallops, no carotid bruit.  Pulmonary: Effort and breath sounds normal, dullness to percussion at the bilateral lobes area  Abdominal: Soft. BS +,   no distension, tenderness, rebound or guarding.  Musculoskeletal: Normal range of motion. +2 bilateral LE edema  Lymphadenopathy: No lymphadenopathy noted, cervical, inguinal. Neuro: Alert. Normal reflexes, muscle tone coordination. No cranial nerve deficit. Skin: Skin is warm and dry. No rash noted. Not diaphoretic. No erythema. No pallor.  Psychiatric: Normal mood and affect. Behavior, judgment, thought content normal.   Labs on Admission:  Basic Metabolic Panel:  Recent Labs Lab 04/13/15 2055 04/13/15 2337 04/14/15 0256 04/15/15 0453 04/15/15 1509  NA 136 139 140 140 140  K 5.4* 4.2 4.0 4.1 3.7  CL 104 105 108 107 108  CO2 22  --  24 23 21*  GLUCOSE 86 90 117* 85 90  BUN 31* 30* 24* 15 15  CREATININE 1.74* 1.50* 1.37* 0.98 0.95  CALCIUM 8.7*  --  8.6* 8.6* 8.6*   Liver Function Tests:  Recent Labs Lab 04/13/15 2055  AST 37  ALT 15*  ALKPHOS 61  BILITOT 1.2  PROT 7.1  ALBUMIN 3.2*   No results for input(s): LIPASE, AMYLASE in the last 168 hours. No results for input(s): AMMONIA in the last 168 hours. CBC:  Recent Labs Lab 04/13/15 2055 04/13/15 2337 04/14/15 0256 04/15/15 1509  WBC 6.8  --  6.2 5.7  NEUTROABS 3.9  --   --   --   HGB 10.5* 12.9* 10.0* 10.3*  HCT 32.6* 38.0* 31.4* 32.8*  MCV 88.3  --  88.0 88.9  PLT 204  --  174 176   Cardiac Enzymes:  Recent Labs Lab 04/14/15 0256 04/14/15 0952 04/14/15 1551 04/15/15 1531  TROPONINI <0.03 <0.03 0.06* <0.03    EKG: Normal sinus rhythm, no ST/T wave changes   If 7PM-7AM, please contact night-coverage [email or web address] Password [facility] 04/15/2015, 6:05 PM

## 2015-04-15 NOTE — Discharge Summary (Addendum)
Physician Discharge Summary  Jeremy [name] NWG:[id number] DOB: 10-02-1925 DOA: 04/13/2015  PCP: [name], MD  Admit date: 04/13/2015 Discharge date: 04/15/2015  Time spent: 20 minutes  Recommendations for Outpatient Follow-up:  1. Follow up with PCP in 2-3 weeks   Discharge Diagnoses:  Principal Problem:   Syncope Active Problems:   Essential hypertension   Hypothyroidism   Dementia with behavioral disturbance   CKD (chronic kidney disease) stage 3, GFR 30-59 ml/min   Anemia of chronic renal failure, stage 3 (moderate)   BPH (benign prostatic hyperplasia)   Acute kidney injury superimposed on chronic kidney disease (HCC)   Discharge Condition: Improved  Diet recommendation: Heart healthy  Filed Weights   04/14/15 0141  Weight: 67.7 kg (149 lb 4 oz)    History of present illness:  Please review dictated H and P from 4/12 for details. Briefly, 82 year old male with past medical history significant for CAD, CHF, HTN, Hyperlipidemia, and Dementia; who was brought to the emergency department for sudden onset of unresponsiveness  Hospital Course:  Syncope/ unresponsiveness: Patient noted to be responsible for some period in time with initial vitals noted to be hypotensive 80/40 by EMS. Patient's last echocardiogram was in 01/2014 with EF of 55-60% and grade 1 diastolic dysfunction. Patient presented clinically dehydrated and noted to be orthostatic on admission - Clinically improved after IVF hydration - Repeat orthostatics are neg. Renal function normalized. Patient clinically much improved, agreed by family in room.  Transient hypotension: Resolved following IVF  Acute kidney injury on chronic kidney disease: Patient's baseline creatinine appears to be less than 1 when adequately hydrated. Creatinine elevated at 1.46 on admission, similar to previous admissions for dehydration and urinary tract infections. - IV fluids were given and renal function has  normalized  Asymptomatic bacturia: Presenting urinalysis showing pos leuks, 6-30 WBCs, and only a few bacteria seen in setting of chronic foley cath - Given 1 dose of Rocephin in the ED - Presenting UA with only 6-30 WBC's in setting of chronic foley with few bacteria.  - Patient remained afebrile with no leukocytosis this admission and clinically improved without antibiotics  CAD with history of chronic diastolic congestive heart failure. Last EF noted be 55-60% with grade 1 diastolic dysfunction. - Continued aspirin, Plavix, Imdur, metoprolol, losartan, and simvastatin  Essential hypertension - Continue metoprolol and losartan  Hypothyroidism - Check TSH - Continue levothyroxine  Dementia: Stable - Continue to monitor  Anemia:Patient's hemoglobin was initially noted to be 12.9 platelet appears his baseline is somewhere around 10-11.  Hyperlipidemia - Continued simvastatin  BPH - continued Proscar  Lovenox DVT prophylaxis   Discharge Exam: Filed Vitals:   04/14/15 1414 04/14/15 2234 04/15/15 0623 04/15/15 0908  BP:  100/79 178/92 178/68  Pulse:  104 76   Temp: 97.7 F (36.5 C)  97.8 F (36.6 C)   TempSrc: Oral  Oral   Resp: 18  18   Height:      Weight:      SpO2: 100% 73% 100%     General: Awake, in nad Cardiovascular: regular, s1, s2 Respiratory: normal resp effort, no wheezing  Discharge Instructions     Medication List    TAKE these medications        aspirin EC 81 MG tablet  Take 81 mg by mouth daily.     clopidogrel 75 MG tablet  Commonly known as:  PLAVIX  Take 1 tablet (75 mg total) by mouth daily.     CRANBERRY PO  Take 1 tablet by mouth daily.     dorzolamide 2 % ophthalmic solution  Commonly known as:  TRUSOPT  Place 1 drop into the right eye 2 (two) times daily.     finasteride 5 MG tablet  Commonly known as:  PROSCAR  Take 1 tablet (5 mg total) by mouth daily.     isosorbide mononitrate 30 MG 24 hr tablet  Commonly known  as:  IMDUR  Take 1 tablet (30 mg total) by mouth daily.     latanoprost 0.005 % ophthalmic solution  Commonly known as:  XALATAN  Place 1 drop into both eyes at bedtime.     levothyroxine 75 MCG tablet  Commonly known as:  SYNTHROID, LEVOTHROID  Take 75 mcg by mouth daily.     losartan 50 MG tablet  Commonly known as:  COZAAR  Take 50 mg by mouth daily.     meloxicam 15 MG tablet  Commonly known as:  MOBIC  Take 15 mg by mouth daily.     metoprolol succinate 50 MG 24 hr tablet  Commonly known as:  TOPROL-XL  Take  (one-half tablet) once daily by mouth with or immediately following a meal.     QUEtiapine 25 MG tablet  Commonly known as:  SEROQUEL  Take 1 tablet (25 mg total) by mouth at bedtime. For behaviors     ranitidine 75 MG tablet  Commonly known as:  ZANTAC  Take 75 mg by mouth daily.     simvastatin 40 MG tablet  Commonly known as:  ZOCOR  Take 40 mg by mouth daily at 6 PM.     timolol 0.5 % ophthalmic solution  Commonly known as:  TIMOPTIC  Place 1 drop into both eyes 2 (two) times daily.     Vitamin D (Ergocalciferol) 50000 units Caps capsule  Commonly known as:  DRISDOL  Take 50,000 Units by mouth 2 (two) times a week.       Allergies  Allergen Reactions  . Tramadol Nausea And Vomiting   Follow-up Information    Follow up with [facility]   Why:  Home health to resume at discharge   Contact information:   [address] [place] [address] [phone]       Follow up with [name], MD. Schedule an appointment as soon as possible for a visit in 2 weeks.   Specialty:  Internal Medicine   Why:  Hospital follow up   Contact information:   [address] [place] [address] [phone]        The results of significant diagnostics from this hospitalization (including imaging, microbiology, ancillary and laboratory) are listed below for reference.    Significant Diagnostic Studies: Dg Chest 2  View  04/15/2015  CLINICAL DATA:  Syncope EXAM: CHEST  2 VIEW COMPARISON:  04/13/2015 FINDINGS: Heart size and vascularity normal. Negative for heart failure. Negative for pneumonia. No mass lesion. Minimal right pleural effusion. IMPRESSION: Minimal right pleural effusion.  Negative for pneumonia. Electronically Signed   By: Marlan Palau M.D.   On: 04/15/2015 16:17   Dg Chest 2 View  04/13/2015  CLINICAL DATA:  Syncope.  Dyspnea.  Onset tonight. EXAM: CHEST  2 VIEW COMPARISON:  None. FINDINGS: There is moderate aortic tortuosity. The lungs are grossly clear. No effusions. Normal pulmonary vasculature. IMPRESSION: No acute cardiopulmonary disease. Electronically Signed   By: Ellery Plunk M.D.   On: 04/13/2015 23:47    Microbiology: Recent Results (  from the past 240 hour(s))  Blood Culture (routine x 2)     Status: None (Preliminary result)   Collection Time: 04/13/15 11:13 PM  Result Value Ref Range Status   Specimen Description BLOOD LEFT ARM  Final   Special Requests IN PEDIATRIC BOTTLE 3ML  Final   Culture NO GROWTH 2 DAYS  Final   Report Status PENDING  Incomplete  Blood Culture (routine x 2)     Status: None (Preliminary result)   Collection Time: 04/13/15 11:18 PM  Result Value Ref Range Status   Specimen Description BLOOD LEFT ARM  Final   Special Requests IN PEDIATRIC BOTTLE 3ML  Final   Culture NO GROWTH 2 DAYS  Final   Report Status PENDING  Incomplete  Urine culture     Status: Abnormal (Preliminary result)   Collection Time: 04/13/15 11:52 PM  Result Value Ref Range Status   Specimen Description URINE, RANDOM  Final   Special Requests NONE  Final   Culture >=100,000 COLONIES/mL PSEUDOMONAS AERUGINOSA (A)  Final   Report Status PENDING  Incomplete  MRSA PCR Screening     Status: Abnormal   Collection Time: 04/14/15  2:54 AM  Result Value Ref Range Status   MRSA by PCR POSITIVE (A) NEGATIVE Final    Comment:        The GeneXpert MRSA Assay (FDA approved for NASAL  specimens only), is one component of a comprehensive MRSA colonization surveillance program. It is not intended to diagnose MRSA infection nor to guide or monitor treatment for MRSA infections. RESULT CALLED TO, READ BACK BY AND VERIFIED WITH: M RUDISILL,RN @0623  04/14/15 MKELLY      Labs: Basic Metabolic Panel:  Recent Labs Lab 04/13/15 2055 04/13/15 2337 04/14/15 0256 04/15/15 0453 04/15/15 1509  NA 136 139 140 140 140  K 5.4* 4.2 4.0 4.1 3.7  CL 104 105 108 107 108  CO2 22  --  24 23 21*  GLUCOSE 86 90 117* 85 90  BUN 31* 30* 24* 15 15  CREATININE 1.74* 1.50* 1.37* 0.98 0.95  CALCIUM 8.7*  --  8.6* 8.6* 8.6*   Liver Function Tests:  Recent Labs Lab 04/13/15 2055  AST 37  ALT 15*  ALKPHOS 61  BILITOT 1.2  PROT 7.1  ALBUMIN 3.2*   No results for input(s): LIPASE, AMYLASE in the last 168 hours. No results for input(s): AMMONIA in the last 168 hours. CBC:  Recent Labs Lab 04/13/15 2055 04/13/15 2337 04/14/15 0256 04/15/15 1509  WBC 6.8  --  6.2 5.7  NEUTROABS 3.9  --   --   --   HGB 10.5* 12.9* 10.0* 10.3*  HCT 32.6* 38.0* 31.4* 32.8*  MCV 88.3  --  88.0 88.9  PLT 204  --  174 176   Cardiac Enzymes:  Recent Labs Lab 04/14/15 0256 04/14/15 0952 04/14/15 1551 04/15/15 1531  TROPONINI <0.03 <0.03 0.06* <0.03   BNP: BNP (last 3 results) No results for input(s): BNP in the last 8760 hours.  ProBNP (last 3 results) No results for input(s): PROBNP in the last 8760 hours.  CBG: No results for input(s): GLUCAP in the last 168 hours.  Signed:  Vikrant Pryce, Scheryl MartenSTEPHEN K  [facility] 04/15/2015, 6:13 PM

## 2015-04-15 NOTE — ED Notes (Signed)
Notified Dr. Lenise ArenaMeyers of purulent discharge from pt penis as well as area foley is coming out (erosion) and feeling that pt is sore.  Await orders. Reported same to RN on 3E

## 2015-04-15 NOTE — ED Notes (Signed)
Call to 3E, RN to call me back

## 2015-04-15 NOTE — Progress Notes (Signed)
Text paged on-call hospitalist of the fact that cozaar, Imdur, and toprol needed to be given and if they should be given considering admission diagnosis of having syncope. Wife is at the bedside and also has some concern about that as well. On-call hospitalist advised to go ahead and give those medications if weren't given.  After talking to the wife more, and her stating since the patient had been previously discharged earlier today, that the patient probably had the medications that were due on this shift which included the medications listed above. [facility] patient had been on 5W and it had been charted given. Therefore the medications were not given that were scheduled at 1845. The wife agreed and understood.   Also explained to the wife that the patient is at risk for developing a CAUTI for several reasons. The patient's urine has become more cloudy than at the beginning of the shift. Based off of urinalysis results, the patient may be at risk for urinary tract infection and for the fact that there is a yellowish drainage from the insertion site of the foley catheter. And last the fact that the foley has been a chronic foley, puts the patient at risk for a CAUTI occurrence for when the patient is at home, it is unclear if foley care has been done on a regular basis. Therefore, removed foley catheter to prevent an occurrence of a CAUTI. Notified the on-call hospitalist of this matter for I made them aware that nurse will bladder scan patient every 2-3 hours and that nurse will in and out cath the patient secondary to urine retention per doctor's order. Hospitalist agreed. Notified the wife of this matter and she approved of foley being out and performing bladder scan and in and out cath. Will continue to monitor patient to end of shift.

## 2015-04-15 NOTE — Progress Notes (Signed)
Pt discharge education and instructions completed with pt and daughter at bedside; daughter voices understanding and denied any questions. Pt IV and telemetry removed; foley care completed and emptied bag prior to discharge. Pt cleaned and changed into clean clothing. Pt discharge home with daughter to transport him home. Pt transported off unit via wheelchair with daughter and belongings to the side. Arabella MerlesP. Amo Ervie Mccard RN.

## 2015-04-15 NOTE — ED Provider Notes (Signed)
Level V caveat dementia history is obtained from patient's daughter who accompanies him patient returned home from inpatient stay 1:30 PM today he was jovial and talking on the right home. He went into the house by wheelchair was sitting at lunch table he had. Unconsciousness lasting possibly 5 minutes. He is somewhat somnolent since the event. He denied pain anywhere. Patient has been eating and drinking well. Patient was admitted here 2 days ago for syncopal event.  Doug SouSam Camari Wisham, MD 04/16/15 0002

## 2015-04-15 NOTE — Progress Notes (Signed)
Pharmacy Antibiotic Note  Jeremy [name] is a 82 y.o. male admitted on 04/15/2015 with UTI.  Pharmacy has been consulted for Zosyn dosing. Pt presents with LOC after being discharged from hospital earlier today. Pt was given 1g of ceftriaxone yesterday during admission.  Plan: Zosyn 3.375g IV q8h (4 hour infusion).  Monitor culture data, renal function and clinical course  Weight: 149 lb (67.586 kg)  Temp (24hrs), Avg:97.9 F (36.6 C), Min:97.8 F (36.6 C), Max:97.9 F (36.6 C)   Recent Labs Lab 04/13/15 2055 04/13/15 2157 04/13/15 2337 04/13/15 2356 04/14/15 0256 04/15/15 0453 04/15/15 1509  WBC 6.8  --   --   --  6.2  --  5.7  CREATININE 1.74*  --  1.50*  --  1.37* 0.98 0.95  LATICACIDVEN  --  2.24*  --  1.97  --   --   --     Estimated Creatinine Clearance: 52.4 mL/min (by C-G formula based on Cr of 0.95).    Allergies  Allergen Reactions  . Tramadol Nausea And Vomiting    Antimicrobials this admission: Zosyn 4/13 >>  CTX 4/12   Dose adjustments this admission: n/a  Microbiology results: 4/11 BCx: ngtd 4/11 UCx: >100k pseudomonas   Sputum:   4/12 MRSA PCR: pos   Arlean Hoppingorey M. Newman PiesBall, PharmD, BCPS Clinical Pharmacist Pager [phone] 04/15/2015 6:30 PM

## 2015-04-16 ENCOUNTER — Observation Stay (HOSPITAL_COMMUNITY): Payer: Medicare Other

## 2015-04-16 DIAGNOSIS — R55 Syncope and collapse: Secondary | ICD-10-CM | POA: Diagnosis not present

## 2015-04-16 DIAGNOSIS — I130 Hypertensive heart and chronic kidney disease with heart failure and stage 1 through stage 4 chronic kidney disease, or unspecified chronic kidney disease: Secondary | ICD-10-CM | POA: Diagnosis present

## 2015-04-16 DIAGNOSIS — N183 Chronic kidney disease, stage 3 (moderate): Secondary | ICD-10-CM | POA: Diagnosis present

## 2015-04-16 DIAGNOSIS — N400 Enlarged prostate without lower urinary tract symptoms: Secondary | ICD-10-CM | POA: Diagnosis present

## 2015-04-16 DIAGNOSIS — I951 Orthostatic hypotension: Secondary | ICD-10-CM | POA: Diagnosis present

## 2015-04-16 DIAGNOSIS — E860 Dehydration: Secondary | ICD-10-CM | POA: Diagnosis present

## 2015-04-16 DIAGNOSIS — Z791 Long term (current) use of non-steroidal anti-inflammatories (NSAID): Secondary | ICD-10-CM | POA: Diagnosis not present

## 2015-04-16 DIAGNOSIS — E039 Hypothyroidism, unspecified: Secondary | ICD-10-CM | POA: Diagnosis present

## 2015-04-16 DIAGNOSIS — Z7902 Long term (current) use of antithrombotics/antiplatelets: Secondary | ICD-10-CM | POA: Diagnosis not present

## 2015-04-16 DIAGNOSIS — N179 Acute kidney failure, unspecified: Secondary | ICD-10-CM | POA: Diagnosis present

## 2015-04-16 DIAGNOSIS — I5032 Chronic diastolic (congestive) heart failure: Secondary | ICD-10-CM | POA: Diagnosis present

## 2015-04-16 DIAGNOSIS — F0391 Unspecified dementia with behavioral disturbance: Secondary | ICD-10-CM | POA: Diagnosis present

## 2015-04-16 DIAGNOSIS — Z955 Presence of coronary angioplasty implant and graft: Secondary | ICD-10-CM | POA: Diagnosis not present

## 2015-04-16 DIAGNOSIS — Z79899 Other long term (current) drug therapy: Secondary | ICD-10-CM | POA: Diagnosis not present

## 2015-04-16 DIAGNOSIS — E785 Hyperlipidemia, unspecified: Secondary | ICD-10-CM | POA: Diagnosis present

## 2015-04-16 DIAGNOSIS — Z993 Dependence on wheelchair: Secondary | ICD-10-CM | POA: Diagnosis not present

## 2015-04-16 DIAGNOSIS — Z87891 Personal history of nicotine dependence: Secondary | ICD-10-CM | POA: Diagnosis not present

## 2015-04-16 DIAGNOSIS — D631 Anemia in chronic kidney disease: Secondary | ICD-10-CM | POA: Diagnosis present

## 2015-04-16 DIAGNOSIS — Z7982 Long term (current) use of aspirin: Secondary | ICD-10-CM | POA: Diagnosis not present

## 2015-04-16 DIAGNOSIS — I2510 Atherosclerotic heart disease of native coronary artery without angina pectoris: Secondary | ICD-10-CM | POA: Diagnosis present

## 2015-04-16 DIAGNOSIS — R4182 Altered mental status, unspecified: Secondary | ICD-10-CM | POA: Diagnosis present

## 2015-04-16 LAB — BASIC METABOLIC PANEL
Anion gap: 9 (ref 5–15)
BUN: 13 mg/dL (ref 6–20)
CHLORIDE: 106 mmol/L (ref 101–111)
CO2: 26 mmol/L (ref 22–32)
Calcium: 8.9 mg/dL (ref 8.9–10.3)
Creatinine, Ser: 1.07 mg/dL (ref 0.61–1.24)
GFR calc Af Amer: >60 mL/min (ref >60)
GFR calc non Af Amer: >60 mL/min (ref >60)
Glucose, Bld: 80 mg/dL (ref 65–99)
POTASSIUM: 3.9 mmol/L (ref 3.5–5.1)
SODIUM: 141 mmol/L (ref 135–145)

## 2015-04-16 LAB — ECHOCARDIOGRAM COMPLETE
Height: 72 in
Weight: 1561.6 oz

## 2015-04-16 LAB — CBC
HEMATOCRIT: 33.5 % — AB (ref 39.0–52.0)
Hemoglobin: 10.7 g/dL — ABNORMAL LOW (ref 13.0–17.0)
MCH: 28.2 pg (ref 26.0–34.0)
MCHC: 31.9 g/dL (ref 30.0–36.0)
MCV: 88.4 fL (ref 78.0–100.0)
Platelets: 169 10*3/uL (ref 150–400)
RBC: 3.79 MIL/uL — ABNORMAL LOW (ref 4.22–5.81)
RDW: 16.6 % — AB (ref 11.5–15.5)
WBC: 6.2 10*3/uL (ref 4.0–10.5)

## 2015-04-16 LAB — GLUCOSE, CAPILLARY
Glucose-Capillary: 61 mg/dL — ABNORMAL LOW (ref 65–99)
Glucose-Capillary: 83 mg/dL (ref 65–99)

## 2015-04-16 NOTE — Progress Notes (Addendum)
Talked to patient's daughter, patient has private caregiver daily for several hours and [facility]. Liborio NixonJanice daughter at bedside states that he has a walker and cane at home. She states that he might need a hospital bed and possible oxygen at discharge. CM will continue to follow for DCP; B Shelba FlakeChandler RN,MHA,BSN [phone]

## 2015-04-16 NOTE — Care Management Obs Status (Signed)
MEDICARE OBSERVATION STATUS NOTIFICATION   Patient Details  Name: Pershing CoxJames T Kirstein MRN: [id number] Date of Birth: Jul 27, 1925   Medicare Observation Status Notification Given:  Yes  Pt / Daughter stated will sign notice later.  Cherrie Distancehandler, Jamaiyah Pyle L, RN 04/16/2015, 1:33 PM

## 2015-04-16 NOTE — Progress Notes (Signed)
Hypoglycemic Event  CBG: 61  Treatment: 15 GM carbohydrate snack  Symptoms: None  Follow-up CBG: Time:6:50 AM CBG Result:83  Possible Reasons for Event: Unknown  Comments/MD notified:Text-paged on-call hospitalist    [facility], Jeremy Aki

## 2015-04-16 NOTE — Progress Notes (Signed)
Bladder scanned patient and result-504cc. Straight cathed patient and got out 500cc. Text-paged hospitalist to notify that straight cath has been performed two times for patient has not voided on his own and no urine present in bag of condom cath that was applied. Removed condom cath also for that reason and will not apply back on for penis is slightly irritated. Family states they don't understand the reason of why we are in and out cath-ing the patient. Explained to the patient that due to pus drainage from the previous foley catheter, it is very pertinent that the foley must be removed and that is has been advised by our director to remove foley catheter to prevent a CAUTI occurrence. Also explained to the wife that it is pertinent to remove the foley catheter to prevent the patient from being septic for there are small signs that an infection is brewing even though the patient does not have a temperature, that can easily go missed if a prudent nurse does not recognize signs of CAUTI. Explained will leave a note for doctor to discuss with wife about the not having the foley catheter. Will continue to monitor patient to end of shift.

## 2015-04-16 NOTE — Progress Notes (Signed)
Performed bladder scan and result was 350-400cc. Performed in and out cath and result was 500cc. Applied condom catheter on to patient to help keep patient dry. Patient tolerated it well. Will continue to monitor patient to end of shift.

## 2015-04-16 NOTE — Progress Notes (Signed)
Patient ID: JP EASTHAM, male   DOB: [birth year], 82 y.o.   MRN: [id number]    PROGRESS NOTE    SAULO ANTHIS  WUJ:[id number] DOB: February 23, 1925 DOA: 04/15/2015  PCP: [name], MD   Outpatient Specialists: Seen Dr. Mena Goes in the office in February 2017 ([facility])  Brief Narrative:  82 year old male with past medical history significant for CAD, CHF, HTN, Hyperlipidemia, and Dementia; who was brought to the emergency department several hours after he was discharged form the hospital for evaluation of episodes of passing out. [name] explains this why he was hospitalized from 4/11 - 4/12 and he seemed like he was doing well initially but when he got home he felt somewhat weeks and while sitting at the dinner table he suddenly passed out. Pt explains he still feels weak but no specific concerns such as chest pain or shortness of breath, no abd concerns. His daughter says urine smelled bed. No fevers or chills.   In ED, pt noted to be hemodynamically stable, VSS, one BP reading was 178/92 but repeat 119/84, initial oxygen sat 73% and pt placed on O2 via [place] and saturation at the time of the admission up to 100%. UA suggestive of UTI. Urine culture results back today (from last hospital stay) with Pseudomonas and with no final sensitivity report. TRH asked to admit for further evaluation.   Assessment & Plan:    Syncope and collapse - unclear etiology - no clear evidence of acute cardiopulmonary event - ? UTI  - ECHO pending - PT eval requested    Oxygen desaturation - some dullness to percussion at the bilateral lower lobes with R pleural effusion - CT chest unremarkable  - keep on O2 to maintain oxygen saturations > 90%   Essential HTN - continue home medical regimen  - reasonable inpatient control    UTI - place on Zosyn, continue for now as pt apparently did not get any since arriving to the unit - once pt more alert and can tolerate, can consider transitioning to oral  ABX  DVT prophylaxis: Lovenox SQ Code Status: Full  Family Communication: Daughter at bedside  Disposition Plan: Home by 4/16  Consultants:   Dr. Isabel Caprice urologist over the phone regarding foley cath vs suprapubic cath placement   Procedures:   ECHO 4/14 -->  Antimicrobials:   Zosyn 4/13 -->   Subjective: No events overnight.   Objective: Filed Vitals:   04/15/15 2255 04/16/15 0252 04/16/15 0500 04/16/15 0900  BP: 144/90 134/84  139/72  Pulse: 69 71  75  Temp: 98.6 F (37 C) 98 F (36.7 C)  97.9 F (36.6 C)  TempSrc: Oral Oral  Oral  Resp: Height:      Weight:   44.271 kg (97 lb 9.6 oz)   SpO2: 100% 100%  100%    Intake/Output Summary (Last 24 hours) at 04/16/15 1218 Last data filed at 04/16/15 0900  Gross per 24 hour  Intake 1398.83 ml  Output   1700 ml  Net -301.17 ml   Filed Weights   04/15/15 1455 04/15/15 1836 04/16/15 0500  Weight: 67.586 kg (149 lb) 75.07 kg (165 lb 8 oz) 44.271 kg (97 lb 9.6 oz)    Examination:  General exam: Appears calm and comfortable, sleeping, easy to awake, let him rest per family request  Respiratory system: Clear to auscultation. Respiratory effort normal.Diminished breath sounds at bases  Cardiovascular system: S1 & S2 heard, RRR. No rubs, gallops or  clicks. No pedal edema. Gastrointestinal system: Abdomen is nondistended, soft and nontender.    Data Reviewed: I have personally reviewed following labs and imaging studies  CBC:  Recent Labs Lab 04/13/15 2055 04/13/15 2337 04/14/15 0256 04/15/15 1509 04/15/15 2026 04/16/15 0455  WBC 6.8  --  6.2 5.7 5.9 6.2  NEUTROABS 3.9  --   --   --   --   --   HGB 10.5* 12.9* 10.0* 10.3* 10.6* 10.7*  HCT 32.6* 38.0* 31.4* 32.8* 33.3* 33.5*  MCV 88.3  --  88.0 88.9 88.1 88.4  PLT 204  --  174 176 188 169   Basic Metabolic Panel:  Recent Labs Lab 04/13/15 2055 04/13/15 2337 04/14/15 0256 04/15/15 0453 04/15/15 1509 04/15/15 2026 04/16/15 0455  NA 136  139 140 140 140  --  141  K 5.4* 4.2 4.0 4.1 3.7  --  3.9  CL 104 105 108 107 108  --  106  CO2 22  --  24 23 21*  --  26  GLUCOSE 86 90 117* 85 90  --  80  BUN 31* 30* 24* 15 15  --  13  CREATININE 1.74* 1.50* 1.37* 0.98 0.95 0.90 1.07  CALCIUM 8.7*  --  8.6* 8.6* 8.6*  --  8.9  MG  --   --   --   --   --  1.7  --   PHOS  --   --   --   --   --  2.4*  --    Liver Function Tests:  Recent Labs Lab 04/13/15 2055  AST 37  ALT 15*  ALKPHOS 61  BILITOT 1.2  PROT 7.1  ALBUMIN 3.2*   Cardiac Enzymes:  Recent Labs Lab 04/14/15 0256 04/14/15 0952 04/14/15 1551 04/15/15 1531  TROPONINI <0.03 <0.03 0.06* <0.03   CBG:  Recent Labs Lab 04/16/15 0602 04/16/15 0649  GLUCAP 61* 83   Thyroid Function Tests:  Recent Labs  04/14/15 0256  TSH 3.830   Urine analysis:    Component Value Date/Time   COLORURINE AMBER* 04/15/2015 1500   APPEARANCEUR CLOUDY* 04/15/2015 1500   LABSPEC 1.020 04/15/2015 1500   PHURINE 7.0 04/15/2015 1500   GLUCOSEU NEGATIVE 04/15/2015 1500   HGBUR TRACE* 04/15/2015 1500   BILIRUBINUR SMALL* 04/15/2015 1500   KETONESUR 15* 04/15/2015 1500   PROTEINUR 30* 04/15/2015 1500   UROBILINOGEN 0.2 11/08/2014 0334   NITRITE POSITIVE* 04/15/2015 1500   LEUKOCYTESUR LARGE* 04/15/2015 1500   Recent Results (from the past 240 hour(s))  Blood Culture (routine x 2)     Status: None (Preliminary result)   Collection Time: 04/13/15 11:13 PM  Result Value Ref Range Status   Specimen Description BLOOD LEFT ARM  Final   Special Requests IN PEDIATRIC BOTTLE  Final   Culture NO GROWTH 2 DAYS  Final   Report Status PENDING  Incomplete  Blood Culture (routine x 2)     Status: None (Preliminary result)   Collection Time: 04/13/15 11:18 PM  Result Value Ref Range Status   Specimen Description BLOOD LEFT ARM  Final   Special Requests IN PEDIATRIC BOTTLE  Final   Culture NO GROWTH 2 DAYS  Final   Report Status PENDING  Incomplete  Urine culture      Status: Abnormal (Preliminary result)   Collection Time: 04/13/15 11:52 PM  Result Value Ref Range Status   Specimen Description URINE, RANDOM  Final   Special Requests NONE  Final  Culture >=100,000 COLONIES/mL PSEUDOMONAS AERUGINOSA (A)  Final   Report Status PENDING  Incomplete   Organism ID, Bacteria PSEUDOMONAS AERUGINOSA (A)  Final      Susceptibility   Pseudomonas aeruginosa - MIC*    CEFTAZIDIME 4 SENSITIVE Sensitive     CIPROFLOXACIN <=0.25 SENSITIVE Sensitive     GENTAMICIN 2 SENSITIVE Sensitive     IMIPENEM 2 SENSITIVE Sensitive     PIP/TAZO 8 SENSITIVE Sensitive     CEFEPIME 4 SENSITIVE Sensitive     * >=100,000 COLONIES/mL PSEUDOMONAS AERUGINOSA  MRSA PCR Screening     Status: Abnormal   Collection Time: 04/14/15  2:54 AM  Result Value Ref Range Status   MRSA by PCR POSITIVE (A) NEGATIVE Final    Radiology Studies: Dg Chest 2 View 04/15/2015 Minimal right pleural effusion.  Negative for pneumonia.   Ct Chest Wo Contrast 04/15/2015 No acute abnormality seen to explain the patient's symptoms. 2. Minimal bilateral atelectasis noted.  Lungs otherwise clear.   Scheduled Meds: . aspirin EC  81 mg Oral Daily  . clopidogrel  75 mg Oral Daily  . dorzolamide  1 drop Right Eye BID  . enoxaparin (LOVENOX) injection  40 mg Subcutaneous Q24H  . famotidine  20 mg Oral Daily  . finasteride  5 mg Oral Daily  . isosorbide mononitrate  30 mg Oral Daily  . latanoprost  1 drop Both Eyes QHS  . levothyroxine  75 mcg Oral QAC breakfast  . losartan  50 mg Oral Daily  . meloxicam  15 mg Oral Daily  . metoprolol succinate  25 mg Oral Daily  . piperacillin-tazobactam (ZOSYN)  IV  3.375 g Intravenous Q8H  . QUEtiapine  25 mg Oral QHS  . simvastatin  40 mg Oral q1800  . sodium chloride flush  3 mL Intravenous Q12H  . timolol  1 drop Both Eyes BID  . Vitamin D (Ergocalciferol)  50,000 Units Oral Once per day on Mon Thu   Continuous Infusions:    LOS: 1 day   Time spent: 20 minutes     Debbora PrestoMAGICK-Bradleigh Sonnen, MD [facility] Pager [phone]  If 7PM-7AM, please contact night-coverage [email or web address] Password [email or web address] 04/16/2015, 12:18 PM

## 2015-04-16 NOTE — Progress Notes (Signed)
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

## 2015-04-16 NOTE — Evaluation (Signed)
Physical Therapy Evaluation Patient Details Name: Jeremy [name] MRN: [id number] DOB: [birth year] Today's Date: 04/16/2015   History of Present Illness  [age] y.o. male admitted on 04/15/2015 with UTI  Clinical Impression  Patient demonstrates deficits in functional mobility as indicated below. Will need continued skilled PT to address deficits and maximize function. Will see as indicated and progress as tolerated.    Follow Up Recommendations Home health PT;Supervision/Assistance - 24 hour    Equipment Recommendations  Hospital bed    Recommendations for Other Services       Precautions / Restrictions Precautions Precautions: Fall Restrictions Weight Bearing Restrictions: No      Mobility  Bed Mobility Overal bed mobility: Needs Assistance Bed Mobility: Supine to Sit;Sit to Supine     Supine to sit: Min assist Sit to supine: Min assist;HOB elevated   General bed mobility comments: Min assist to bring trunk to upright positionat EOB with hip rotation. Min assist to elevate LEs back to bed   Transfers Overall transfer level: Needs assistance Equipment used: Rolling walker (2 wheeled) Transfers: Sit to/from Stand Sit to Stand: Mod assist;From elevated surface         General transfer comment: Patient required assist to position at EOB, manual assist to place hands on RW and increased physical assist with gait belt to elevate to standing. Performed x3 during session  Ambulation/Gait             General Gait Details: did not assess  Stairs            Wheelchair Mobility    Modified Rankin (Stroke Patients Only)       Balance Overall balance assessment: Needs assistance Sitting-balance support: Feet supported Sitting balance-Leahy Scale: Fair       Standing balance-Leahy Scale: Poor Standing balance comment: heavy reliance on physical assist and RW. Patient posterior bias in static standind                             Pertinent  Vitals/Pain      Home Living Family/patient expects to be discharged to:: Private residence Living Arrangements: Spouse/significant other Available Help at Discharge: Family;Available 24 hours/day (grandson states they are working on 24/7 supervision.) Type of Home: House Home Access: Stairs to enter Entrance Stairs-Rails: Left Entrance Stairs-Number of Steps: 3 Home Layout: Multi-level Home Equipment: Environmental consultantWalker - 2 wheels;Cane - single point;Bedside commode;Shower seat Additional Comments: decreased vision, per grandson reports pt "really cant see at atll".    Prior Function Level of Independence: Needs assistance         Comments: Pt lives with wife who has dementia and cannot assist him. Per pt's grandson pt and pt's wife has someone at there house 24 hours, usually daughters coming morning and night     Hand Dominance   Dominant Hand: Right    Extremity/Trunk Assessment   Upper Extremity Assessment: Generalized weakness           Lower Extremity Assessment: Generalized weakness      Cervical / Trunk Assessment: Kyphotic  Communication   Communication: HOH  Cognition Arousal/Alertness: Awake/alert Behavior During Therapy: WFL for tasks assessed/performed Overall Cognitive Status: History of cognitive impairments - at baseline                      General Comments General comments (skin integrity, edema, etc.): stood for ~8 minutes for pericare and hygiene. Patient toelrated weight shifts but relied  heavily on UE support    Exercises        Assessment/Plan    PT Assessment Patient needs continued PT services  PT Diagnosis Difficulty walking;Abnormality of gait;Generalized weakness;Altered mental status   PT Problem List Decreased strength;Decreased activity tolerance;Decreased balance;Decreased mobility;Decreased cognition;Decreased safety awareness;Cardiopulmonary status limiting activity  PT Treatment Interventions DME instruction;Gait  training;Stair training;Functional mobility training;Therapeutic activities;Therapeutic exercise;Balance training;Patient/family education   PT Goals (Current goals can be found in the Care Plan section) Acute Rehab PT Goals Patient Stated Goal: per daughter most likely to return home with HHPT PT Goal Formulation: With patient/family Time For Goal Achievement: 04/30/15 Potential to Achieve Goals: Good    Frequency Min 3X/week   Barriers to discharge        Co-evaluation               End of Session Equipment Utilized During Treatment: Gait belt Activity Tolerance: Patient tolerated treatment well;Patient limited by fatigue Patient left: in bed;with call bell/phone within reach;with bed alarm set;with family/visitor present Nurse Communication: Mobility status         Time: 1450-1511 PT Time Calculation (min) (ACUTE ONLY): 21 min   Charges:   PT Evaluation $PT Eval Moderate Complexity: 1 Procedure     PT G CodesFabio Asa 05-09-2015, 6:29 PM Charlotte Crumb, PT DPT  [phone]

## 2015-04-17 ENCOUNTER — Encounter (HOSPITAL_COMMUNITY): Payer: Self-pay | Admitting: *Deleted

## 2015-04-17 LAB — BASIC METABOLIC PANEL
ANION GAP: 10 (ref 5–15)
BUN: 16 mg/dL (ref 6–20)
CHLORIDE: 109 mmol/L (ref 101–111)
CO2: 21 mmol/L — AB (ref 22–32)
Calcium: 8.6 mg/dL — ABNORMAL LOW (ref 8.9–10.3)
Creatinine, Ser: 1.07 mg/dL (ref 0.61–1.24)
GFR calc non Af Amer: >60 mL/min (ref >60)
Glucose, Bld: 75 mg/dL (ref 65–99)
POTASSIUM: 4 mmol/L (ref 3.5–5.1)
Sodium: 140 mmol/L (ref 135–145)

## 2015-04-17 LAB — CBC
HEMATOCRIT: 31.2 % — AB (ref 39.0–52.0)
HEMOGLOBIN: 10.3 g/dL — AB (ref 13.0–17.0)
MCH: 29.4 pg (ref 26.0–34.0)
MCHC: 33 g/dL (ref 30.0–36.0)
MCV: 89.1 fL (ref 78.0–100.0)
Platelets: 165 10*3/uL (ref 150–400)
RBC: 3.5 MIL/uL — AB (ref 4.22–5.81)
RDW: 17 % — ABNORMAL HIGH (ref 11.5–15.5)
WBC: 6.1 10*3/uL (ref 4.0–10.5)

## 2015-04-17 LAB — GLUCOSE, CAPILLARY: GLUCOSE-CAPILLARY: 68 mg/dL (ref 65–99)

## 2015-04-17 MED ORDER — CIPROFLOXACIN HCL 500 MG PO TABS
500.0000 mg | ORAL_TABLET | Freq: Two times a day (BID) | ORAL | Status: DC
  Administered 2015-04-17 – 2015-04-19 (×5): 500 mg via ORAL
  Filled 2015-04-17 (×4): qty 1

## 2015-04-17 NOTE — Progress Notes (Signed)
Patient ID: Jeremy [name], male   DOB: [birth year], 82 y.o.   MRN: [id number]    PROGRESS NOTE    Jeremy [name]  WGN:[id number] DOB: [birth year] DOA: 04/15/2015  PCP: Gwynneth AlimentSANDERS,ROBYN N, MD   Outpatient Specialists: Seen Dr. Mena GoesEskridge in the office in February 2017 ([facility])  Brief Narrative:  82 year old male with past medical history significant for CAD, CHF, HTN, Hyperlipidemia, and Dementia; who was brought to the emergency department several hours after he was discharged form the hospital for evaluation of episodes of passing out. [name] explains this why he was hospitalized from 4/11 - 4/12 and he seemed like he was doing well initially but when he got home he felt somewhat weeks and while sitting at the dinner table he suddenly passed out. Pt explains he still feels weak but no specific concerns such as chest pain or shortness of breath, no abd concerns. His daughter says urine smelled bed. No fevers or chills.   In ED, pt noted to be hemodynamically stable, VSS, one BP reading was 178/92 but repeat 119/84, initial oxygen sat 73% and pt placed on O2 via [place] and saturation at the time of the admission up to 100%. UA suggestive of UTI. Urine culture results back today (from last hospital stay) with Pseudomonas and with no final sensitivity report. TRH asked to admit for further evaluation.   Assessment & Plan:    Syncope and collapse - unclear etiology - no clear evidence of acute cardiopulmonary event - ? UTI  - ECHO with normal EF but grade I diastolic CHF - PT eval requested, HH PT recommended and order placed    Oxygen desaturation - some dullness to percussion at the bilateral lower lobes with R pleural effusion - CT chest unremarkable  - currently maintaining oxygen saturation at target range    Essential HTN - continue home medical regimen  - reasonable inpatient control    UTI - was on zosyn, urine culture with Pseudomonas - transition to oral Cipro today and if pt  tolerating well can be d/c home in AM  DVT prophylaxis: Lovenox SQ Code Status: Full  Family Communication: Daughter at bedside  Disposition Plan: Home by 4/16  Consultants:   Dr. Isabel CapriceGrapey urologist over the phone regarding foley cath vs suprapubic cath placement   Procedures:   ECHO 4/14 -->  Antimicrobials:   Zosyn 4/13 --> 4/15  Cipro 4/15 -->  Subjective: No events overnight.   Objective: Filed Vitals:   04/16/15 1238 04/16/15 1734 04/16/15 2043 04/17/15 0617  BP: 103/64 119/76 105/68 147/84  Pulse: 73 66 73 72  Temp: 98.1 F (36.7 C) 97.7 F (36.5 C) 98 F (36.7 C) 98.2 F (36.8 C)  TempSrc: Oral Oral Oral Oral  Resp: 18 18 18 18   Height:      Weight:    75.297 kg (166 lb)  SpO2: 98% 100% 100% 100%    Intake/Output Summary (Last 24 hours) at 04/17/15 1234 Last data filed at 04/17/15 1011  Gross per 24 hour  Intake    440 ml  Output   1250 ml  Net   -810 ml   Filed Weights   04/15/15 1836 04/16/15 0500 04/17/15 0617  Weight: 75.07 kg (165 lb 8 oz) 44.271 kg (97 lb 9.6 oz) 75.297 kg (166 lb)    Examination:  General exam: Appears calm and comfortable, sleeping, easy to awake, let him rest per family request  Respiratory system: Clear to auscultation. Respiratory effort normal.Diminished breath sounds  at bases  Cardiovascular system: S1 & S2 heard, RRR. No rubs, gallops or clicks. No pedal edema. Gastrointestinal system: Abdomen is nondistended, soft and nontender.   Data Reviewed: I have personally reviewed following labs and imaging studies  CBC:  Recent Labs Lab 04/13/15 2055  04/14/15 0256 04/15/15 1509 04/15/15 2026 04/16/15 0455 04/17/15 0242  WBC 6.8  --  6.2 5.7 5.9 6.2 6.1  NEUTROABS 3.9  --   --   --   --   --   --   HGB 10.5*  < > 10.0* 10.3* 10.6* 10.7* 10.3*  HCT 32.6*  < > 31.4* 32.8* 33.3* 33.5* 31.2*  MCV 88.3  --  88.0 88.9 88.1 88.4 89.1  PLT 204  --  174 176 188 169 165  < > = values in this interval not  displayed.  Basic Metabolic Panel:  Recent Labs Lab 04/14/15 0256 04/15/15 0453 04/15/15 1509 04/15/15 2026 04/16/15 0455 04/17/15 0242  NA 140 140 140  --  141 140  K 4.0 4.1 3.7  --  3.9 4.0  CL 108 107 108  --  106 109  CO2 24 23 21*  --  26 21*  GLUCOSE 117* 85 90  --  80 75  BUN 24* 15 15  --  13 16  CREATININE 1.37* 0.98 0.95 0.90 1.07 1.07  CALCIUM 8.6* 8.6* 8.6*  --  8.9 8.6*  MG  --   --   --  1.7  --   --   PHOS  --   --   --  2.4*  --   --    Liver Function Tests:  Recent Labs Lab 04/13/15 2055  AST 37  ALT 15*  ALKPHOS 61  BILITOT 1.2  PROT 7.1  ALBUMIN 3.2*   Cardiac Enzymes:  Recent Labs Lab 04/14/15 0256 04/14/15 0952 04/14/15 1551 04/15/15 1531  TROPONINI <0.03 <0.03 0.06* <0.03   CBG:  Recent Labs Lab 04/16/15 0602 04/16/15 0649 04/17/15 0631  GLUCAP 61* 83 68   Urine analysis:    Component Value Date/Time   COLORURINE AMBER* 04/15/2015 1500   APPEARANCEUR CLOUDY* 04/15/2015 1500   LABSPEC 1.020 04/15/2015 1500   PHURINE 7.0 04/15/2015 1500   GLUCOSEU NEGATIVE 04/15/2015 1500   HGBUR TRACE* 04/15/2015 1500   BILIRUBINUR SMALL* 04/15/2015 1500   KETONESUR 15* 04/15/2015 1500   PROTEINUR 30* 04/15/2015 1500   UROBILINOGEN 0.2 11/08/2014 0334   NITRITE POSITIVE* 04/15/2015 1500   LEUKOCYTESUR LARGE* 04/15/2015 1500   Recent Results (from the past 240 hour(s))  Blood Culture (routine x 2)     Status: None (Preliminary result)   Collection Time: 04/13/15 11:13 PM  Result Value Ref Range Status   Specimen Description BLOOD LEFT ARM  Final   Special Requests IN PEDIATRIC BOTTLE  Final   Culture NO GROWTH 2 DAYS  Final   Report Status PENDING  Incomplete  Blood Culture (routine x 2)     Status: None (Preliminary result)   Collection Time: 04/13/15 11:18 PM  Result Value Ref Range Status   Specimen Description BLOOD LEFT ARM  Final   Special Requests IN PEDIATRIC BOTTLE  Final   Culture NO GROWTH 2 DAYS  Final    Report Status PENDING  Incomplete  Urine culture     Status: Abnormal (Preliminary result)   Collection Time: 04/13/15 11:52 PM  Result Value Ref Range Status   Specimen Description URINE, RANDOM  Final   Special Requests  NONE  Final   Culture >=100,000 COLONIES/mL PSEUDOMONAS AERUGINOSA (A)  Final   Report Status PENDING  Incomplete   Organism ID, Bacteria PSEUDOMONAS AERUGINOSA (A)  Final      Susceptibility   Pseudomonas aeruginosa - MIC*    CEFTAZIDIME 4 SENSITIVE Sensitive     CIPROFLOXACIN <=0.25 SENSITIVE Sensitive     GENTAMICIN 2 SENSITIVE Sensitive     IMIPENEM 2 SENSITIVE Sensitive     PIP/TAZO 8 SENSITIVE Sensitive     CEFEPIME 4 SENSITIVE Sensitive     * >=100,000 COLONIES/mL PSEUDOMONAS AERUGINOSA  MRSA PCR Screening     Status: Abnormal   Collection Time: 04/14/15  2:54 AM  Result Value Ref Range Status   MRSA by PCR POSITIVE (A) NEGATIVE Final    Radiology Studies: Dg Chest 2 View 04/15/2015 Minimal right pleural effusion.  Negative for pneumonia.   Ct Chest Wo Contrast 04/15/2015 No acute abnormality seen to explain the patient's symptoms. 2. Minimal bilateral atelectasis noted.  Lungs otherwise clear.   Scheduled Meds: . aspirin EC  81 mg Oral Daily  . ciprofloxacin  500 mg Oral BID  . clopidogrel  75 mg Oral Daily  . dorzolamide  1 drop Right Eye BID  . enoxaparin (LOVENOX) injection  40 mg Subcutaneous Q24H  . famotidine  20 mg Oral Daily  . finasteride  5 mg Oral Daily  . isosorbide mononitrate  30 mg Oral Daily  . latanoprost  1 drop Both Eyes QHS  . levothyroxine  75 mcg Oral QAC breakfast  . losartan  50 mg Oral Daily  . meloxicam  15 mg Oral Daily  . metoprolol succinate  25 mg Oral Daily  . QUEtiapine  25 mg Oral QHS  . simvastatin  40 mg Oral q1800  . sodium chloride flush  3 mL Intravenous Q12H  . timolol  1 drop Both Eyes BID  . Vitamin D (Ergocalciferol)  50,000 Units Oral Once per day on Mon Thu   Continuous Infusions:    LOS: 2 days    Time spent: 20 minutes   Debbora Presto, MD [facility] Pager [phone]  If 7PM-7AM, please contact night-coverage [email or web address] Password [facility] 04/17/2015, 12:34 PM

## 2015-04-18 LAB — CULTURE, BLOOD (ROUTINE X 2)
CULTURE: NO GROWTH
CULTURE: NO GROWTH

## 2015-04-18 LAB — URINE CULTURE: Culture: >=100000 — AB

## 2015-04-18 LAB — GLUCOSE, CAPILLARY: GLUCOSE-CAPILLARY: 79 mg/dL (ref 65–99)

## 2015-04-18 LAB — TROPONIN I: Troponin I: <0.03 ng/mL (ref <0.031)

## 2015-04-18 MED ORDER — ACETAMINOPHEN 325 MG PO TABS
650.0000 mg | ORAL_TABLET | Freq: Four times a day (QID) | ORAL | Status: DC | PRN
  Administered 2015-04-18: 650 mg via ORAL
  Filled 2015-04-18: qty 2

## 2015-04-18 NOTE — Progress Notes (Signed)
DR Izola PriceMyers called informed of patient c/o lt side chest pain orders received .

## 2015-04-18 NOTE — Evaluation (Signed)
Occupational Therapy Evaluation Patient Details Name: Jeremy [name] MRN: [id number] DOB: [birth year] Today's Date: 04/18/2015    History of Present Illness 82 y.o. male admitted on 04/15/2015 who was brought to the emergency department several hours after he was discharged from the hospital for evaluation of episodes of passing out. UA suggestive of UTI. PMH includes demenita, CAD, CHF, HTN, HLD.   Clinical Impression   Pt admitted with above. Pt requiring assist with ADLs, PTA. Feel pt will benefit from acute OT to increase independence prior to d/c. Recommending HHOT.    Follow Up Recommendations  Home health OT;Supervision/Assistance - 24 hour    Equipment Recommendations  None recommended by OT    Recommendations for Other Services       Precautions / Restrictions Precautions Precautions: Fall Restrictions Weight Bearing Restrictions: No      Mobility Bed Mobility               General bed mobility comments: not assessed  Transfers Overall transfer level: Needs assistance Equipment used: Rolling walker (2 wheeled) Transfers: Sit to/from Stand Sit to Stand: Min assist         General transfer comment: assist to place hand on armrest and assist to boost.    Balance         Ambulated in room with RW with Mod assist for stability and walker management.                                    ADL Overall ADL's : Needs assistance/impaired Eating/Feeding: Minimal assistance;Sitting   Grooming: Wash/dry face;Sitting;Minimal assistance Grooming Details (indicate cue type and reason): washed face with setup and supervision level             Lower Body Dressing: Sit to/from stand;Moderate assistance   Toilet Transfer: Moderate assistance;Ambulation;RW (sit to stand from chair)           Functional mobility during ADLs: Moderate assistance;Rolling walker General ADL Comments: talked with daughter about letting pt do what he can.      Vision  poor vision per daughter   Perception     Praxis      Pertinent Vitals/Pain Pain Assessment: No/denies pain     Hand Dominance Right   Extremity/Trunk Assessment Upper Extremity Assessment Upper Extremity Assessment: Generalized weakness (shoulder flexors)   Lower Extremity Assessment Lower Extremity Assessment: Defer to PT evaluation       Communication Communication Communication: No difficulties   Cognition Arousal/Alertness: Awake/alert Behavior During Therapy: WFL for tasks assessed/performed Overall Cognitive Status: History of cognitive impairments - at baseline                     General Comments       Exercises       Shoulder Instructions      Home Living Family/patient expects to be discharged to:: Private residence Living Arrangements: Spouse/significant other Available Help at Discharge: Available 24 hours/day;Family (unsure if they have others to assist) Type of Home: House Home Access: Stairs to enter [employer] of Steps: 3 Entrance Stairs-Rails: Left Home Layout: Multi-level Alternate Level Stairs-Number of Steps: 7 steps between levels   Bathroom Shower/Tub: Tub/shower unit;Walk-in shower   Bathroom Toilet: Standard     Home Equipment: Environmental consultant - 2 wheels;Cane - single point;Shower seat;Bedside commode;Wheelchair - manual          Prior Functioning/Environment Level of Independence: Needs  assistance  Gait / Transfers Assistance Needed: assist with transfers/mobility ADL's / Homemaking Assistance Needed: assist with dressing, bathing, feeding, shower transfer        OT Diagnosis: Generalized weakness   OT Problem List: Decreased strength;Impaired balance (sitting and/or standing);Decreased cognition;Decreased knowledge of use of DME or AE;Decreased knowledge of precautions;Impaired vision/perception   OT Treatment/Interventions: Self-care/ADL training;DME and/or AE instruction;Therapeutic  activities;Balance training;Patient/family education;Visual/perceptual remediation/compensation;Cognitive remediation/compensation;Therapeutic exercise    OT Goals(Current goals can be found in the care plan section) Acute Rehab OT Goals Patient Stated Goal: not stated OT Goal Formulation: With family Time For Goal Achievement: 04/25/15 Potential to Achieve Goals: Good ADL Goals Pt Will Perform Lower Body Bathing: sit to/from stand;with min assist Pt Will Perform Lower Body Dressing: sit to/from stand;with min assist Pt Will Transfer to Toilet: with min assist;ambulating;bedside commode  OT Frequency: Min 2X/week   Barriers to D/C:            Co-evaluation              End of Session Equipment Utilized During Treatment: Gait belt;Rolling walker Nurse Communication: Mobility status;Other (comment) (HHOT)  Activity Tolerance: Patient tolerated treatment well Patient left: in chair;with nursing/sitter in room;with family/visitor present;with chair alarm set   Time: [id number] OT Time Calculation (min): 20 min Charges:  OT General Charges $OT Visit: 1 Procedure OT Evaluation $OT Eval Moderate Complexity: 1 Procedure G-CodesEarlie [name]:    Jeremy [name] OTR/[name] [phone] 04/18/2015, 3:05 PM

## 2015-04-18 NOTE — Progress Notes (Addendum)
Patient ID: Jeremy [name], male   DOB: 03-23-1925, 82 y.o.   MRN: [id number]    PROGRESS NOTE    Jeremy [name]  WUJ:[id number] DOB: [birth year] DOA: 04/15/2015  PCP: Jeremy Aliment, MD   Outpatient Specialists: Seen Jeremy [name] in the office in February 2017 (BPH)  Brief Narrative:  82 year old male with past medical history significant for CAD, CHF, HTN, Hyperlipidemia, and Dementia; who was brought to the emergency department several hours after he was discharged form the hospital for evaluation of episodes of passing out. [name] explains this why he was hospitalized from 4/11 - 4/12 and he seemed like he was doing well initially but when he got home he felt somewhat weeks and while sitting at the dinner table he suddenly passed out. Pt explains he still feels weak but no specific concerns such as chest pain or shortness of breath, no abd concerns. His daughter says urine smelled bed. No fevers or chills.   In ED, pt noted to be hemodynamically stable, VSS, one BP reading was 178/92 but repeat 119/84, initial oxygen sat 73% and pt placed on O2 via [place] and saturation at the time of the admission up to 100%. UA suggestive of UTI. Urine culture results back today (from last hospital stay) with Pseudomonas and with no final sensitivity report. TRH asked to admit for further evaluation.   Assessment & Plan:    Syncope and collapse - ? Vasovagal in etiology  - no clear evidence of acute cardiopulmonary event - ? UTI  - ECHO with normal EF but grade I diastolic CHF - PT eval requested, HH PT recommended and order placed  - pt feels still unsteady even with walker - asked RN and tech to walk pt again to re assess    Oxygen desaturation - some dullness to percussion at the bilateral lower lobes with R pleural effusion - CT chest unremarkable  - currently maintaining oxygen saturation at target range    Essential HTN - continue home medical regimen  - reasonable inpatient control     UTI due to indwelling urinary cath  - was on zosyn, urine culture with Pseudomonas - continue Cipro upon discharge, pt tolerating well so far   DVT prophylaxis: Lovenox SQ Code Status: Full  Family Communication: Daughter at bedside  Disposition Plan: Home by 4/17  Consultants:   Jeremy [name] urologist over the phone regarding foley cath vs suprapubic cath placement   Procedures:   ECHO 4/14 -->  Antimicrobials:   Zosyn 4/13 --> 4/15  Cipro 4/15 -->  Subjective: No events overnight.   Objective: Filed Vitals:   04/17/15 1300 04/17/15 2118 04/18/15 0600 04/18/15 1252  BP: 98/63 106/63 152/97 121/81  Pulse: 74 73 71 78  Temp:  97.5 F (36.4 C) 97.7 F (36.5 C) 97.8 F (36.6 C)  TempSrc:  Oral Oral Oral  Resp:  Height:      Weight:   75.887 kg (167 lb 4.8 oz)   SpO2:  100% 100% 100%    Intake/Output Summary (Last 24 hours) at 04/18/15 1307 Last data filed at 04/18/15 1013  Gross per 24 hour  Intake    460 ml  Output   1975 ml  Net  -1515 ml   Filed Weights   04/16/15 0500 04/17/15 0617 04/18/15 0600  Weight: 44.271 kg (97 lb 9.6 oz) 75.297 kg (166 lb) 75.887 kg (167 lb 4.8 oz)    Examination:  General exam: Appears calm and  comfortable, sleeping, easy to awake, let him rest per family request  Respiratory system: Clear to auscultation. Respiratory effort normal.Diminished breath sounds at bases  Cardiovascular system: S1 & S2 heard, RRR. No rubs, gallops or clicks. No pedal edema. Gastrointestinal system: Abdomen is nondistended, soft and nontender.   Data Reviewed: I have personally reviewed following labs and imaging studies  CBC:  Recent Labs Lab 04/13/15 2055  04/14/15 0256 04/15/15 1509 04/15/15 2026 04/16/15 0455 04/17/15 0242  WBC 6.8  --  6.2 5.7 5.9 6.2 6.1  NEUTROABS 3.9  --   --   --   --   --   --   HGB 10.5*  < > 10.0* 10.3* 10.6* 10.7* 10.3*  HCT 32.6*  < > 31.4* 32.8* 33.3* 33.5* 31.2*  MCV 88.3  --  88.0 88.9 88.1  88.4 89.1  PLT 204  --  174 176 188 169 165  < > = values in this interval not displayed.  Basic Metabolic Panel:  Recent Labs Lab 04/14/15 0256 04/15/15 0453 04/15/15 1509 04/15/15 2026 04/16/15 0455 04/17/15 0242  NA 140 140 140  --  141 140  K 4.0 4.1 3.7  --  3.9 4.0  CL 108 107 108  --  106 109  CO2 24 23 21*  --  26 21*  GLUCOSE 117* 85 90  --  80 75  BUN 24* 15 15  --  13 16  CREATININE 1.37* 0.98 0.95 0.90 1.07 1.07  CALCIUM 8.6* 8.6* 8.6*  --  8.9 8.6*  MG  --   --   --  1.7  --   --   PHOS  --   --   --  2.4*  --   --    Liver Function Tests:  Recent Labs Lab 04/13/15 2055  AST 37  ALT 15*  ALKPHOS 61  BILITOT 1.2  PROT 7.1  ALBUMIN 3.2*   Cardiac Enzymes:  Recent Labs Lab 04/14/15 0256 04/14/15 0952 04/14/15 1551 04/15/15 1531  TROPONINI <0.03 <0.03 0.06* <0.03   CBG:  Recent Labs Lab 04/16/15 0602 04/16/15 0649 04/17/15 0631 04/18/15 0602  GLUCAP 61* 83 68 79   Urine analysis:    Component Value Date/Time   COLORURINE AMBER* 04/15/2015 1500   APPEARANCEUR CLOUDY* 04/15/2015 1500   LABSPEC 1.020 04/15/2015 1500   PHURINE 7.0 04/15/2015 1500   GLUCOSEU NEGATIVE 04/15/2015 1500   HGBUR TRACE* 04/15/2015 1500   BILIRUBINUR SMALL* 04/15/2015 1500   KETONESUR 15* 04/15/2015 1500   PROTEINUR 30* 04/15/2015 1500   UROBILINOGEN 0.2 11/08/2014 0334   NITRITE POSITIVE* 04/15/2015 1500   LEUKOCYTESUR LARGE* 04/15/2015 1500   Recent Results (from the past 240 hour(s))  Blood Culture (routine x 2)     Status: None (Preliminary result)   Collection Time: 04/13/15 11:13 PM  Result Value Ref Range Status   Specimen Description BLOOD LEFT ARM  Final   Special Requests IN PEDIATRIC BOTTLE 3ML  Final   Culture NO GROWTH 2 DAYS  Final   Report Status PENDING  Incomplete  Blood Culture (routine x 2)     Status: None (Preliminary result)   Collection Time: 04/13/15 11:18 PM  Result Value Ref Range Status   Specimen Description BLOOD LEFT ARM   Final   Special Requests IN PEDIATRIC BOTTLE 3ML  Final   Culture NO GROWTH 2 DAYS  Final   Report Status PENDING  Incomplete  Urine culture     Status: Abnormal (Preliminary result)  Collection Time: 04/13/15 11:52 PM  Result Value Ref Range Status   Specimen Description URINE, RANDOM  Final   Special Requests NONE  Final   Culture >=100,000 COLONIES/mL PSEUDOMONAS AERUGINOSA (A)  Final   Report Status PENDING  Incomplete   Organism ID, Bacteria PSEUDOMONAS AERUGINOSA (A)  Final      Susceptibility   Pseudomonas aeruginosa - MIC*    CEFTAZIDIME 4 SENSITIVE Sensitive     CIPROFLOXACIN <=0.25 SENSITIVE Sensitive     GENTAMICIN 2 SENSITIVE Sensitive     IMIPENEM 2 SENSITIVE Sensitive     PIP/TAZO 8 SENSITIVE Sensitive     CEFEPIME 4 SENSITIVE Sensitive     * >=100,000 COLONIES/mL PSEUDOMONAS AERUGINOSA  MRSA PCR Screening     Status: Abnormal   Collection Time: 04/14/15  2:54 AM  Result Value Ref Range Status   MRSA by PCR POSITIVE (A) NEGATIVE Final    Radiology Studies: Dg Chest 2 View 04/15/2015 Minimal right pleural effusion.  Negative for pneumonia.   Ct Chest Wo Contrast 04/15/2015 No acute abnormality seen to explain the patient's symptoms. 2. Minimal bilateral atelectasis noted.  Lungs otherwise clear.   Scheduled Meds: . aspirin EC  81 mg Oral Daily  . ciprofloxacin  500 mg Oral BID  . clopidogrel  75 mg Oral Daily  . dorzolamide  1 drop Right Eye BID  . enoxaparin (LOVENOX) injection  40 mg Subcutaneous Q24H  . famotidine  20 mg Oral Daily  . finasteride  5 mg Oral Daily  . isosorbide mononitrate  30 mg Oral Daily  . latanoprost  1 drop Both Eyes QHS  . levothyroxine  75 mcg Oral QAC breakfast  . losartan  50 mg Oral Daily  . meloxicam  15 mg Oral Daily  . metoprolol succinate  25 mg Oral Daily  . QUEtiapine  25 mg Oral QHS  . simvastatin  40 mg Oral q1800  . sodium chloride flush  3 mL Intravenous Q12H  . timolol  1 drop Both Eyes BID  . Vitamin D  (Ergocalciferol)  50,000 Units Oral Once per day on Mon Thu   Continuous Infusions:    LOS: 3 days   Time spent: 20 minutes   Debbora Presto, MD [facility] Pager [phone]  If 7PM-7AM, please contact night-coverage [email or web address] Password [email or web address] 04/18/2015, 1:07 PM

## 2015-04-18 NOTE — Progress Notes (Addendum)
CM met with pt and family in room to discuss disposition.  Pt has 24/7 caretakers HOWEVER, as it is Jeremy [name], and the family thought pt would be in the hospital, they have given the caretaker the day off.  Caretaker will be available tomorrow, 04/19/15; CM encouraged family to make a plan for care until the caretaker takes over.  Previous CM has arranged for home health service with [name].  This CM has notified [facility] orders have been placed.  No other CM needs were communicated.

## 2015-04-19 DIAGNOSIS — L8990 Pressure ulcer of unspecified site, unspecified stage: Secondary | ICD-10-CM | POA: Insufficient documentation

## 2015-04-19 DIAGNOSIS — N390 Urinary tract infection, site not specified: Secondary | ICD-10-CM | POA: Diagnosis present

## 2015-04-19 DIAGNOSIS — T83511A Infection and inflammatory reaction due to indwelling urethral catheter, initial encounter: Secondary | ICD-10-CM

## 2015-04-19 LAB — TROPONIN I

## 2015-04-19 LAB — GLUCOSE, CAPILLARY: Glucose-Capillary: 70 mg/dL (ref 65–99)

## 2015-04-19 MED ORDER — CIPROFLOXACIN HCL 500 MG PO TABS: 500.0000 mg | ORAL_TABLET | Freq: Two times a day (BID) | ORAL | Status: DC

## 2015-04-19 NOTE — Progress Notes (Signed)
1500 Family updated by MD before D/C Case manager [email or web address] transport to go home as request from family Family aware

## 2015-04-19 NOTE — Progress Notes (Signed)
CBG 70, gave OJ will recheck shortly, Thanks Lavonda JumboMike F RN

## 2015-04-19 NOTE — Discharge Summary (Addendum)
Physician Discharge Summary  Jeremy [name] VHQ:[id number] DOB: February 07, 1925 DOA: 04/15/2015  PCP: [name], MD  Admit date: 04/15/2015 Discharge date: 04/19/2015  Recommendations for Outpatient Follow-up:  1. Pt will need to follow up with PCP in 2-3 weeks post discharge 2. Please obtain BMP to evaluate electrolytes and kidney function 3. Please also check CBC to evaluate Hg and Hct levels 4. Must complete therapy with Cipro for UTI 5. Monitor BP closely    Discharge Diagnoses:  Active Problems:   Syncope and collapse   Syncopal episodes  Discharge Condition: Stable  Diet recommendation: Heart healthy diet discussed in details    Brief Narrative:  82 year old male with past medical history significant for CAD, CHF, HTN, Hyperlipidemia, and Dementia; who was brought to the emergency department several hours after he was discharged form the hospital for evaluation of episodes of passing out. [name] explains this why he was hospitalized from 4/11 - 4/12 and he seemed like he was doing well initially but when he got home he felt somewhat weeks and while sitting at the dinner table he suddenly passed out. Pt explains he still feels weak but no specific concerns such as chest pain or shortness of breath, no abd concerns. His daughter says urine smelled bed. No fevers or chills.   In ED, pt noted to be hemodynamically stable, VSS, one BP reading was 178/92 but repeat 119/84, initial oxygen sat 73% and pt placed on O2 via [place] and saturation at the time of the admission up to 100%. UA suggestive of UTI. Urine culture results back today (from last hospital stay) with Pseudomonas and with no final sensitivity report. TRH asked to admit for further evaluation.   Assessment & Plan:   Syncope and collapse - ? Vasovagal in etiology  - no clear evidence of acute cardiopulmonary event - ? UTI  - ECHO with normal EF but grade I diastolic CHF - PT eval requested, HH PT recommended and  order placed  - pt feels still unsteady even with walker   Oxygen desaturation - some dullness to percussion at the bilateral lower lobes with R pleural effusion - CT chest unremarkable  - currently maintaining oxygen saturation at target range    Essential HTN - continue home medical regimen  - reasonable inpatient control  - monitor BP closely    UTI due to indwelling urinary cath  - was on zosyn, urine culture with Pseudomonas - continue Cipro upon discharge, pt tolerating well so far   DVT prophylaxis: Lovenox SQ Code Status: Full  Family Communication: Daughter at bedside  Disposition Plan: Home by 4/17  Consultants:   Dr. Isabel Caprice urologist over the phone regarding foley cath vs suprapubic cath placement  Procedures:   ECHO 4/14 -->  Antimicrobials:   Zosyn 4/13 --> 4/15  Cipro 4/15 -->  Discharge Exam: Filed Vitals:   04/18/15 2058 04/19/15 0526  BP: 135/85 155/91  Pulse: 74 69  Temp: 98 F (36.7 C) 98 F (36.7 C)  Resp: 18 18   Filed Vitals:   04/18/15 1252 04/18/15 1300 04/18/15 2058 04/19/15 0526  BP: 121/81 119/69 135/85 155/91  Pulse: 78 69 74 69  Temp: 97.8 F (36.6 C)  98 F (36.7 C) 98 F (36.7 C)  TempSrc: Oral  Oral Oral  Resp: 18  18 18   Height:      Weight:    74.844 kg (165 lb)  SpO2: 100% 100% 100% 100%    General: Pt is alert, follows  commands appropriately, not in acute distress Cardiovascular: Regular rate and rhythm, no rubs, no gallops Respiratory: Clear to auscultation bilaterally, no wheezing, no crackles, no rhonchi Abdominal: Soft, non tender, non distended, bowel sounds +, no guarding  Discharge Instructions  Discharge Instructions    Diet - low sodium heart healthy    Complete by:  As directed      Increase activity slowly    Complete by:  As directed             Medication List    TAKE these medications        aspirin EC 81 MG tablet  Take 81 mg by mouth daily.     ciprofloxacin 500 MG tablet   Commonly known as:  CIPRO  Take 1 tablet (500 mg total) by mouth 2 (two) times daily.     clopidogrel 75 MG tablet  Commonly known as:  PLAVIX  Take 1 tablet (75 mg total) by mouth daily.     CRANBERRY PO  Take 1 tablet by mouth daily.     dorzolamide 2 % ophthalmic solution  Commonly known as:  TRUSOPT  Place 1 drop into the right eye 2 (two) times daily.     finasteride 5 MG tablet  Commonly known as:  PROSCAR  Take 1 tablet (5 mg total) by mouth daily.     isosorbide mononitrate 30 MG 24 hr tablet  Commonly known as:  IMDUR  Take 1 tablet (30 mg total) by mouth daily.     latanoprost 0.005 % ophthalmic solution  Commonly known as:  XALATAN  Place 1 drop into both eyes at bedtime.     levothyroxine 75 MCG tablet  Commonly known as:  SYNTHROID, LEVOTHROID  Take 75 mcg by mouth daily.     losartan 50 MG tablet  Commonly known as:  COZAAR  Take 50 mg by mouth daily.     meloxicam 15 MG tablet  Commonly known as:  MOBIC  Take 15 mg by mouth daily.     metoprolol succinate 50 MG 24 hr tablet  Commonly known as:  TOPROL-XL  Take 25mg  (one-half tablet) once daily by mouth with or immediately following a meal.     QUEtiapine 25 MG tablet  Commonly known as:  SEROQUEL  Take 1 tablet (25 mg total) by mouth at bedtime. For behaviors     ranitidine 75 MG tablet  Commonly known as:  ZANTAC  Take 75 mg by mouth daily.     simvastatin 40 MG tablet  Commonly known as:  ZOCOR  Take 40 mg by mouth daily at 6 PM.     timolol 0.5 % ophthalmic solution  Commonly known as:  TIMOPTIC  Place 1 drop into both eyes 2 (two) times daily.     Vitamin D (Ergocalciferol) 50000 units Caps capsule  Commonly known as:  DRISDOL  Take 50,000 Units by mouth 2 (two) times a week.           Follow-up Information    [facility]   Why:  They will do your home health care at your home   Contact information:   [address] [place]  [id number] [phone]       Follow up with Gwynneth AlimentSANDERS,ROBYN N, MD.   Specialty:  Internal Medicine   Contact information:   [address] [place] [address] [phone]       Call Debbora PrestoMAGICK-Loyal Rudy, MD.  Specialty:  Internal Medicine   Why:  As needed call my cell phone [phone]   Contact information:   [address] [place] [address] [phone]        The results of significant diagnostics from this hospitalization (including imaging, microbiology, ancillary and laboratory) are listed below for reference.     Microbiology: Recent Results (from the past 240 hour(s))  Blood Culture (routine x 2)     Status: None   Collection Time: 04/13/15 11:13 PM  Result Value Ref Range Status   Specimen Description BLOOD LEFT ARM  Final   Special Requests IN PEDIATRIC BOTTLE  Final   Culture NO GROWTH 5 DAYS  Final   Report Status 04/18/2015 FINAL  Final  Blood Culture (routine x 2)     Status: None   Collection Time: 04/13/15 11:18 PM  Result Value Ref Range Status   Specimen Description BLOOD LEFT ARM  Final   Special Requests IN PEDIATRIC BOTTLE  Final   Culture NO GROWTH 5 DAYS  Final   Report Status 04/18/2015 FINAL  Final  Urine culture     Status: Abnormal   Collection Time: 04/13/15 11:52 PM  Result Value Ref Range Status   Specimen Description URINE, RANDOM  Final   Special Requests NONE  Final   Culture (A)  Final    >=100,000 COLONIES/mL PSEUDOMONAS AERUGINOSA 50,000 COLONIES/mL ENTEROCOCCUS SPECIES    Report Status 04/18/2015 FINAL  Final   Organism ID, Bacteria PSEUDOMONAS AERUGINOSA (A)  Final   Organism ID, Bacteria ENTEROCOCCUS SPECIES (A)  Final      Susceptibility   Pseudomonas aeruginosa - MIC*    CEFTAZIDIME 4 SENSITIVE Sensitive     CIPROFLOXACIN <=0.25 SENSITIVE Sensitive     GENTAMICIN 2 SENSITIVE Sensitive     IMIPENEM 2 SENSITIVE Sensitive     PIP/TAZO 8 SENSITIVE Sensitive     CEFEPIME 4  SENSITIVE Sensitive     * >=100,000 COLONIES/mL PSEUDOMONAS AERUGINOSA   Enterococcus species - MIC*    AMPICILLIN <=2 SENSITIVE Sensitive     LEVOFLOXACIN >=8 RESISTANT Resistant     NITROFURANTOIN <=16 SENSITIVE Sensitive     VANCOMYCIN 1 SENSITIVE Sensitive     * 50,000 COLONIES/mL ENTEROCOCCUS SPECIES  MRSA PCR Screening     Status: Abnormal   Collection Time: 04/14/15  2:54 AM  Result Value Ref Range Status   MRSA by PCR POSITIVE (A) NEGATIVE Final    Comment:        The GeneXpert MRSA Assay (FDA approved for NASAL specimens only), is one component of a comprehensive MRSA colonization surveillance program. It is not intended to diagnose MRSA infection nor to guide or monitor treatment for MRSA infections. RESULT CALLED TO, READ BACK BY AND VERIFIED WITH: M RUDISILL,RN  04/14/15 MKELLY      Labs: Basic Metabolic Panel:  Recent Labs Lab 04/14/15 0256 04/15/15 0453 04/15/15 1509 04/15/15 2026 04/16/15 0455 04/17/15 0242  NA 140 140 140  --  141 140  K 4.0 4.1 3.7  --  3.9 4.0  CL 108 107 108  --  106 109  CO2 24 23 21*  --  26 21*  GLUCOSE 117* 85 90  --  80 75  BUN 24* 15 15  --  13 16  CREATININE 1.37* 0.98 0.95 0.90 1.07 1.07  CALCIUM 8.6* 8.6* 8.6*  --  8.9 8.6*  MG  --   --   --  1.7  --   --  PHOS  --   --   --  2.4*  --   --    Liver Function Tests:  Recent Labs Lab 04/13/15 2055  AST 37  ALT 15*  ALKPHOS 61  BILITOT 1.2  PROT 7.1  ALBUMIN 3.2*   No results for input(s): LIPASE, AMYLASE in the last 168 hours. No results for input(s): AMMONIA in the last 168 hours. CBC:  Recent Labs Lab 04/13/15 2055  04/14/15 0256 04/15/15 1509 04/15/15 2026 04/16/15 0455 04/17/15 0242  WBC 6.8  --  6.2 5.7 5.9 6.2 6.1  NEUTROABS 3.9  --   --   --   --   --   --   HGB 10.5*  < > 10.0* 10.3* 10.6* 10.7* 10.3*  HCT 32.6*  < > 31.4* 32.8* 33.3* 33.5* 31.2*  MCV 88.3  --  88.0 88.9 88.1 88.4 89.1  PLT 204  --  174 176 188 169 165  < > = values  in this interval not displayed. Cardiac Enzymes:  Recent Labs Lab 04/14/15 1551 04/15/15 1531 04/18/15 1810 04/18/15 2311 04/19/15 0530  TROPONINI 0.06* <0.03 <0.03 <0.03 <0.03   BNP: BNP (last 3 results) No results for input(s): BNP in the last 8760 hours.  ProBNP (last 3 results) No results for input(s): PROBNP in the last 8760 hours.  CBG:  Recent Labs Lab 04/16/15 0602 04/16/15 0649 04/17/15 0631 04/18/15 0602 04/19/15 0534  GLUCAP 61* 83 68 79 70   SIGNED: Time coordinating discharge: 30 minutes  MAGICK-Espen Bethel, MD  [facility] 04/19/2015, 9:40 AM Pager [phone]  If 7PM-7AM, please contact night-coverage [email or web address] Password [email or web address]

## 2015-04-19 NOTE — Progress Notes (Signed)
Physical Therapy Treatment Patient Details Name: Jeremy [name] MRN: [id number] DOB: 1925-03-04 Today's Date: 04/19/2015    History of Present Illness 81 y.o. male admitted on 04/15/2015 who was brought to the emergency department several hours after he was discharged from the hospital for evaluation of episodes of passing out. UA suggestive of UTI. PMH includes demenita, CAD, CHF, HTN, HLD.    PT Comments    Pt ambulated 110' with RW and min A today, was encouraged by ability to walk. No hypotension noted with sit to stand this session and pt without symptoms. Pt continues with balance deficits with 1 LOB even with RW. PT will continue to follow.   Follow Up Recommendations  Home health PT;Supervision/Assistance - 24 hour     Equipment Recommendations  Hospital bed    Recommendations for Other Services       Precautions / Restrictions Precautions Precautions: Fall Restrictions Weight Bearing Restrictions: No    Mobility  Bed Mobility               General bed mobility comments: pt received up in chair  Transfers Overall transfer level: Needs assistance Equipment used: Rolling walker (2 wheeled) Transfers: Sit to/from Stand Sit to Stand: Min assist         General transfer comment: pt with posterior lean with initial sitting, min A to get wt shifted fwd and 60 seconds for pt to get balanced in RW  Ambulation/Gait Ambulation/Gait assistance: Min assist Ambulation Distance (Feet): 110 Feet Assistive device: Rolling walker (2 wheeled) Gait Pattern/deviations: Step-through pattern;Decreased stride length;Trunk flexed Gait velocity: decreased Gait velocity interpretation: <1.8 ft/sec, indicative of risk for recurrent falls General Gait Details: min A to guide RW due to pt's visual deficits, 1 LOB with turning with min A to correct, flexed posture due to spinal stenosis   Stairs            Wheelchair Mobility    Modified Rankin (Stroke Patients Only)        Balance Overall balance assessment: Needs assistance;History of Falls Sitting-balance support: No upper extremity supported Sitting balance-Leahy Scale: Fair Sitting balance - Comments: able to maintain static balance sitting edge of chair but unable to correct with perturbation and loses balance bkwd Postural control: Posterior lean Standing balance support: Bilateral upper extremity supported Standing balance-Leahy Scale: Poor Standing balance comment: requires UE support for safety                    Cognition Arousal/Alertness: Awake/alert Behavior During Therapy: WFL for tasks assessed/performed Overall Cognitive Status: History of cognitive impairments - at baseline                      Exercises General Exercises - Lower Extremity Ankle Circles/Pumps: AROM;Both;10 reps;Seated Heel Slides: AROM;Both;10 reps;Seated Hip Flexion/Marching: AROM;Both;10 reps;Standing    General Comments General comments (skin integrity, edema, etc.): BP sitting 139/78, standing 127/84, standing at 3 mins 129/82.       Pertinent Vitals/Pain Pain Assessment: No/denies pain    Home Living                      Prior Function            PT Goals (current goals can now be found in the care plan section) Acute Rehab PT Goals Patient Stated Goal: go home PT Goal Formulation: With patient/family Time For Goal Achievement: 04/30/15 Potential to Achieve Goals: Good Progress towards PT goals: Progressing  toward goals    Frequency  Min 3X/week    PT Plan Current plan remains appropriate    Co-evaluation             End of Session Equipment Utilized During Treatment: Gait belt Activity Tolerance: Patient tolerated treatment well Patient left: in chair;with call bell/phone within reach;with family/visitor present;with chair alarm set     Time: 1231-1300 PT Time Calculation (min) (ACUTE ONLY): 29 min  Charges:  $Gait Training: 8-22 mins $Therapeutic  Activity: 8-22 mins                    G Codes:     Lyanne CoVictoria Adelyne Marchese, PT  Acute Rehab Services  [phone]  Pecola LeisureManess, TurkeyVictoria 04/19/2015, 1:32 PM

## 2015-04-19 NOTE — Discharge Instructions (Signed)

## 2015-04-19 NOTE — Progress Notes (Signed)
Occupational Therapy Treatment Patient Details Name: Jeremy [name] MRN: [id number] DOB: [birth year] Today's Date: 04/19/2015    History of present illness 82 y.o. male admitted on 04/15/2015 who was brought to the emergency department several hours after he was discharged from the hospital for evaluation of episodes of passing out. UA suggestive of UTI. PMH includes demenita, CAD, CHF, HTN, HLD.   OT comments  Patient making slow progress towards OT goals, will continue plan of care for now. Pt limited due to significant posterior lean in standing and during functional mobility this OT session. Attempted to have pt ambulate to door/BR, but due to posterior lean pt unsafe to do so with one person assist. Discussed importance of 24/7 supervision/assistance with patient's daughters and recommendation to use w/c for mobility until pt working with therapists at home.See below under ADL comment for more information regarding this OT session, BP readings documented in that section as well.    Follow Up Recommendations  Home health OT;Supervision/Assistance - 24 hour    Equipment Recommendations  None recommended by OT    Recommendations for Other Services  None at this time   Precautions / Restrictions Precautions Precautions: Fall Precaution Comments: significant posterior lean in standing Restrictions Weight Bearing Restrictions: No    Mobility Bed Mobility General bed mobility comments: Pt found seated in recliner upon OT entering/exiting room   Transfers Overall transfer level: Needs assistance Equipment used: Rolling walker (2 wheeled) Transfers: Sit to/from Stand Sit to Stand: Min assist;Mod assist General transfer comment: Min/mod assist due to significant posterior lean once standing. Multimodal cueing needed for positioning, hand placement, technique, and sequencing.     Balance Overall balance assessment: Needs assistance;History of Falls Sitting-balance support: No upper  extremity supported;Feet supported Sitting balance-Leahy Scale: Fair Sitting balance - Comments: able to maintain static balance sitting edge of chair but unable to correct with perturbation and loses balance bkwd Postural control: Posterior lean Standing balance support: Bilateral upper extremity supported;During functional activity Standing balance-Leahy Scale: Poor Standing balance comment: requires UE support for safety   ADL Overall ADL's : Needs assistance/impaired Eating/Feeding: Minimal assistance;Sitting       Upper Body Bathing: Minimal assitance;Sitting   Lower Body Bathing: Moderate assistance;Sit to/from stand   Upper Body Dressing : Minimal assistance;Sitting   Lower Body Dressing: Sit to/from stand;Maximal assistance     Toilet Transfer Details (indicate cue type and reason): Pt unable to safety ambulate to BR in room due to significant posterior lean in standing and ambulation General ADL Comments: Pt found seated in recliner with two daughters present. Worked with pt on doffing shoes and donning gripper socks, therapist started socks and pt able to pull socks up past ankles. BP checked=152/90 in sitting. Pt stood with no posterior lean initially and BP=140/102. While standing ~1 minute, pt started to have significant posterior lean, almost falling back into recliner. Pt corrected with multimodal cueing from therapist. Pt then attempted to ambulate to door and after walking ~6 feet, pt with posterior lean and started to act like he was going to sit again. Therapist felt anymore ambulation was unsafe due to this and had pt ambulate backwards and sit back into recliner. Daughters expressed concern about this. Therapist discussed importance of patient having 24/7 supervision/assistance and using w/c for mode of mobility at this time until pt working with therapist for ambulation and functional mobility using RW. Daughters agreed, pt stated he understood.            Cognition  Behavior During Therapy: WFL for tasks assessed/performed Overall Cognitive Status: History of cognitive impairments - at baseline                Pertinent Vitals/ Pain       Pain Assessment: No/denies pain         Frequency Min 2X/week     Progress Toward Goals  OT Goals(current goals can now be found in the care plan section)  Progress towards OT goals: Progressing toward goals  Acute Rehab OT Goals Patient Stated Goal: go home OT Goal Formulation: With family Time For Goal Achievement: 04/25/15 Potential to Achieve Goals: Good  Plan Discharge plan remains appropriate    End of Session Equipment Utilized During Treatment: Gait belt;Rolling walker   Activity Tolerance Patient tolerated treatment well   Patient Left in chair;with nursing/sitter in room;with family/visitor present;with chair alarm set  Nurse Communication Mobility status (patient with unsafe mobility during OT treatment)     Time: 1454-1516 OT Time Calculation (min): 22 min  Charges: OT General Charges $OT Visit: 1 Procedure OT Treatments $Self Care/Home Management : 8-22 mins  Edwin Cap , MS, OTR/L, CLT  Pager: [phone]  04/19/2015, 3:24 PM

## 2015-04-19 NOTE — Care Management Important Message (Signed)
Important Message  Patient Details  Name: Jeremy [name] MRN: [id number] Date of Birth: May 15, 1925   Medicare Important Message Given:       Magdalene RiverMayo, Jimmey Hengel T, RN 04/19/2015, 10:51 AM

## 2015-04-19 NOTE — Care Management Note (Signed)
Case Management Note  Patient Details  Name: Jeremy [name] MRN: [id number] Date of Birth: 04/16/1925  Subjective/Objective:   [facility], they will add PT, OT, and aide services.  Pt uses a walker to ambulate.  PT recommended hospital bed but dtr States that he is in an adjustable bed with an air mattress and that is sufficient, he also does not need a BSC as his bathroom is very close to his bed.                              Expected Discharge Plan:  Home w Home Health Services  Discharge planning Services  CM Consult  Post Acute Care Choice:  Resumption of Svcs/PTA Provider  HH Arranged:  RN, PT, OT, Nurse's Aide [facility]  Status of Service:  Completed, signed off  Medicare Important Message Given:   Yes  Magdalene RiverMayo, Cordell Coke T, RN 04/19/2015, 12:37 PM

## 2015-04-20 ENCOUNTER — Telehealth: Payer: Self-pay | Admitting: Cardiovascular Disease

## 2015-04-20 ENCOUNTER — Encounter (HOSPITAL_COMMUNITY): Payer: Self-pay | Admitting: *Deleted

## 2015-04-20 ENCOUNTER — Observation Stay (HOSPITAL_COMMUNITY)
Admission: EM | Admit: 2015-04-20 | Discharge: 2015-04-23 | Disposition: A | Discharge status: Home / Self Care | Payer: Medicare Other | Attending: Internal Medicine | Admitting: Internal Medicine

## 2015-04-20 DIAGNOSIS — I10 Essential (primary) hypertension: Secondary | ICD-10-CM | POA: Diagnosis present

## 2015-04-20 DIAGNOSIS — E43 Unspecified severe protein-calorie malnutrition: Secondary | ICD-10-CM | POA: Diagnosis present

## 2015-04-20 DIAGNOSIS — Z7902 Long term (current) use of antithrombotics/antiplatelets: Secondary | ICD-10-CM | POA: Insufficient documentation

## 2015-04-20 DIAGNOSIS — Z7982 Long term (current) use of aspirin: Secondary | ICD-10-CM | POA: Insufficient documentation

## 2015-04-20 DIAGNOSIS — I5032 Chronic diastolic (congestive) heart failure: Secondary | ICD-10-CM | POA: Diagnosis not present

## 2015-04-20 DIAGNOSIS — Y846 Urinary catheterization as the cause of abnormal reaction of the patient, or of later complication, without mention of misadventure at the time of the procedure: Secondary | ICD-10-CM | POA: Insufficient documentation

## 2015-04-20 DIAGNOSIS — I130 Hypertensive heart and chronic kidney disease with heart failure and stage 1 through stage 4 chronic kidney disease, or unspecified chronic kidney disease: Secondary | ICD-10-CM | POA: Diagnosis not present

## 2015-04-20 DIAGNOSIS — F03918 Unspecified dementia, unspecified severity, with other behavioral disturbance: Secondary | ICD-10-CM | POA: Diagnosis present

## 2015-04-20 DIAGNOSIS — R55 Syncope and collapse: Principal | ICD-10-CM

## 2015-04-20 DIAGNOSIS — E785 Hyperlipidemia, unspecified: Secondary | ICD-10-CM | POA: Diagnosis not present

## 2015-04-20 DIAGNOSIS — Z955 Presence of coronary angioplasty implant and graft: Secondary | ICD-10-CM | POA: Insufficient documentation

## 2015-04-20 DIAGNOSIS — Z87891 Personal history of nicotine dependence: Secondary | ICD-10-CM | POA: Insufficient documentation

## 2015-04-20 DIAGNOSIS — N179 Acute kidney failure, unspecified: Secondary | ICD-10-CM | POA: Diagnosis present

## 2015-04-20 DIAGNOSIS — T83511A Infection and inflammatory reaction due to indwelling urethral catheter, initial encounter: Secondary | ICD-10-CM | POA: Diagnosis not present

## 2015-04-20 DIAGNOSIS — N401 Enlarged prostate with lower urinary tract symptoms: Secondary | ICD-10-CM | POA: Insufficient documentation

## 2015-04-20 DIAGNOSIS — Z791 Long term (current) use of non-steroidal anti-inflammatories (NSAID): Secondary | ICD-10-CM | POA: Insufficient documentation

## 2015-04-20 DIAGNOSIS — N400 Enlarged prostate without lower urinary tract symptoms: Secondary | ICD-10-CM | POA: Diagnosis present

## 2015-04-20 DIAGNOSIS — D631 Anemia in chronic kidney disease: Secondary | ICD-10-CM | POA: Diagnosis not present

## 2015-04-20 DIAGNOSIS — I252 Old myocardial infarction: Secondary | ICD-10-CM | POA: Insufficient documentation

## 2015-04-20 DIAGNOSIS — N320 Bladder-neck obstruction: Secondary | ICD-10-CM | POA: Insufficient documentation

## 2015-04-20 DIAGNOSIS — F0391 Unspecified dementia with behavioral disturbance: Secondary | ICD-10-CM | POA: Diagnosis not present

## 2015-04-20 DIAGNOSIS — N183 Chronic kidney disease, stage 3 unspecified: Secondary | ICD-10-CM | POA: Diagnosis present

## 2015-04-20 DIAGNOSIS — Z79899 Other long term (current) drug therapy: Secondary | ICD-10-CM | POA: Diagnosis not present

## 2015-04-20 DIAGNOSIS — I2510 Atherosclerotic heart disease of native coronary artery without angina pectoris: Secondary | ICD-10-CM | POA: Diagnosis present

## 2015-04-20 DIAGNOSIS — Z22322 Carrier or suspected carrier of Methicillin resistant Staphylococcus aureus: Secondary | ICD-10-CM

## 2015-04-20 DIAGNOSIS — N390 Urinary tract infection, site not specified: Secondary | ICD-10-CM | POA: Diagnosis present

## 2015-04-20 DIAGNOSIS — E039 Hypothyroidism, unspecified: Secondary | ICD-10-CM | POA: Insufficient documentation

## 2015-04-20 HISTORY — DX: Atherosclerotic heart disease of native coronary artery without angina pectoris: I25.10

## 2015-04-20 HISTORY — DX: Hypotension, unspecified: I95.9

## 2015-04-20 HISTORY — DX: Hypothyroidism, unspecified: E03.9

## 2015-04-20 HISTORY — DX: Essential (primary) hypertension: I10

## 2015-04-20 LAB — CBC WITH DIFFERENTIAL/PLATELET
BASOS PCT: 1 %
Basophils Absolute: 0.1 10*3/uL (ref 0.0–0.1)
EOS ABS: 0.3 10*3/uL (ref 0.0–0.7)
Eosinophils Relative: 5 %
HCT: 35.2 % — ABNORMAL LOW (ref 39.0–52.0)
HEMOGLOBIN: 11.2 g/dL — AB (ref 13.0–17.0)
Lymphocytes Relative: 25 %
Lymphs Abs: 1.7 10*3/uL (ref 0.7–4.0)
MCH: 28.2 pg (ref 26.0–34.0)
MCHC: 31.8 g/dL (ref 30.0–36.0)
MCV: 88.7 fL (ref 78.0–100.0)
MONOS PCT: 13 %
Monocytes Absolute: 0.9 10*3/uL (ref 0.1–1.0)
NEUTROS PCT: 56 %
Neutro Abs: 3.7 10*3/uL (ref 1.7–7.7)
Platelets: 160 10*3/uL (ref 150–400)
RBC: 3.97 MIL/uL — AB (ref 4.22–5.81)
RDW: 16.9 % — ABNORMAL HIGH (ref 11.5–15.5)
WBC: 6.6 10*3/uL (ref 4.0–10.5)

## 2015-04-20 LAB — COMPREHENSIVE METABOLIC PANEL
ALK PHOS: 62 U/L (ref 38–126)
ALT: 11 U/L — AB (ref 17–63)
AST: 29 U/L (ref 15–41)
Albumin: 3.2 g/dL — ABNORMAL LOW (ref 3.5–5.0)
Anion gap: 11 (ref 5–15)
BILIRUBIN TOTAL: 1 mg/dL (ref 0.3–1.2)
BUN: 15 mg/dL (ref 6–20)
CALCIUM: 9.3 mg/dL (ref 8.9–10.3)
CO2: 20 mmol/L — ABNORMAL LOW (ref 22–32)
CREATININE: 1.22 mg/dL (ref 0.61–1.24)
Chloride: 106 mmol/L (ref 101–111)
GFR, EST AFRICAN AMERICAN: 60 mL/min — AB (ref >60)
GFR, EST NON AFRICAN AMERICAN: 51 mL/min — AB (ref >60)
Glucose, Bld: 82 mg/dL (ref 65–99)
Potassium: 4.7 mmol/L (ref 3.5–5.1)
Sodium: 137 mmol/L (ref 135–145)
TOTAL PROTEIN: 7.8 g/dL (ref 6.5–8.1)

## 2015-04-20 LAB — URINALYSIS, ROUTINE W REFLEX MICROSCOPIC
BILIRUBIN URINE: NEGATIVE
Glucose, UA: NEGATIVE mg/dL
Hgb urine dipstick: NEGATIVE
Ketones, ur: NEGATIVE mg/dL
Nitrite: NEGATIVE
Protein, ur: NEGATIVE mg/dL
SPECIFIC GRAVITY, URINE: 1.014 (ref 1.005–1.030)
pH: 5.5 (ref 5.0–8.0)

## 2015-04-20 LAB — TROPONIN I

## 2015-04-20 LAB — I-STAT CG4 LACTIC ACID, ED
LACTIC ACID, VENOUS: 2.02 mmol/L — AB (ref 0.5–2.0)
Lactic Acid, Venous: 1.22 mmol/L (ref 0.5–2.0)

## 2015-04-20 LAB — CBG MONITORING, ED: GLUCOSE-CAPILLARY: 79 mg/dL (ref 65–99)

## 2015-04-20 LAB — URINE MICROSCOPIC-ADD ON: RBC / HPF: NONE SEEN RBC/hpf (ref 0–5)

## 2015-04-20 LAB — PROCALCITONIN: Procalcitonin: <0.1 ng/mL

## 2015-04-20 LAB — CORTISOL: CORTISOL PLASMA: 13.1 ug/dL

## 2015-04-20 MED ORDER — ASPIRIN EC 81 MG PO TBEC
81.0000 mg | DELAYED_RELEASE_TABLET | Freq: Every day | ORAL | Status: DC
  Administered 2015-04-21 – 2015-04-23 (×3): 81 mg via ORAL
  Filled 2015-04-20 (×3): qty 1

## 2015-04-20 MED ORDER — DORZOLAMIDE HCL 2 % OP SOLN
1.0000 [drp] | Freq: Two times a day (BID) | OPHTHALMIC | Status: DC
  Administered 2015-04-20 – 2015-04-23 (×6): 1 [drp] via OPHTHALMIC
  Filled 2015-04-20 (×2): qty 10

## 2015-04-20 MED ORDER — VITAMIN D (ERGOCALCIFEROL) 1.25 MG (50000 UNIT) PO CAPS
50000.0000 [IU] | ORAL_CAPSULE | ORAL | Status: DC
  Administered 2015-04-22: 50000 [IU] via ORAL
  Filled 2015-04-20: qty 1

## 2015-04-20 MED ORDER — TIMOLOL MALEATE 0.5 % OP SOLN
1.0000 [drp] | Freq: Two times a day (BID) | OPHTHALMIC | Status: DC
  Administered 2015-04-20 – 2015-04-23 (×6): 1 [drp] via OPHTHALMIC
  Filled 2015-04-20 (×2): qty 5

## 2015-04-20 MED ORDER — LATANOPROST 0.005 % OP SOLN
1.0000 [drp] | Freq: Every day | OPHTHALMIC | Status: DC
  Administered 2015-04-20 – 2015-04-22 (×3): 1 [drp] via OPHTHALMIC
  Filled 2015-04-20: qty 2.5

## 2015-04-20 MED ORDER — ONDANSETRON HCL 4 MG/2ML IJ SOLN: 4.0000 mg | Freq: Four times a day (QID) | INTRAMUSCULAR | Status: DC | PRN

## 2015-04-20 MED ORDER — SODIUM CHLORIDE 0.9 % IV SOLN
INTRAVENOUS | Status: AC
  Administered 2015-04-20: 22:00:00 via INTRAVENOUS

## 2015-04-20 MED ORDER — FINASTERIDE 5 MG PO TABS
5.0000 mg | ORAL_TABLET | Freq: Every evening | ORAL | Status: DC
  Administered 2015-04-21 – 2015-04-22 (×2): 5 mg via ORAL
  Filled 2015-04-20 (×2): qty 1

## 2015-04-20 MED ORDER — CEFTRIAXONE SODIUM 1 G IJ SOLR
1.0000 g | INTRAMUSCULAR | Status: DC
  Administered 2015-04-21 – 2015-04-22 (×2): 1 g via INTRAVENOUS
  Filled 2015-04-20 (×3): qty 10

## 2015-04-20 MED ORDER — POLYETHYLENE GLYCOL 3350 17 G PO PACK: 17.0000 g | PACK | Freq: Every day | ORAL | Status: DC | PRN

## 2015-04-20 MED ORDER — SODIUM CHLORIDE 0.9 % IV BOLUS (SEPSIS)
1000.0000 mL | Freq: Once | INTRAVENOUS | Status: AC
  Administered 2015-04-20: 1000 mL via INTRAVENOUS

## 2015-04-20 MED ORDER — CEFTRIAXONE SODIUM 1 G IJ SOLR
1.0000 g | Freq: Once | INTRAMUSCULAR | Status: AC
  Administered 2015-04-20: 1 g via INTRAVENOUS
  Filled 2015-04-20: qty 10

## 2015-04-20 MED ORDER — HYDROCODONE-ACETAMINOPHEN 5-325 MG PO TABS: 1.0000 | ORAL_TABLET | ORAL | Status: DC | PRN

## 2015-04-20 MED ORDER — CIPROFLOXACIN HCL 500 MG PO TABS: 500.0000 mg | ORAL_TABLET | Freq: Two times a day (BID) | ORAL | Status: DC

## 2015-04-20 MED ORDER — ACETAMINOPHEN 325 MG PO TABS: 650.0000 mg | ORAL_TABLET | Freq: Four times a day (QID) | ORAL | Status: DC | PRN

## 2015-04-20 MED ORDER — BISACODYL 5 MG PO TBEC: 5.0000 mg | DELAYED_RELEASE_TABLET | Freq: Every day | ORAL | Status: DC | PRN

## 2015-04-20 MED ORDER — ENOXAPARIN SODIUM 40 MG/0.4ML ~~LOC~~ SOLN
40.0000 mg | SUBCUTANEOUS | Status: DC
  Administered 2015-04-20 – 2015-04-22 (×3): 40 mg via SUBCUTANEOUS
  Filled 2015-04-20 (×3): qty 0.4

## 2015-04-20 MED ORDER — ONDANSETRON HCL 4 MG PO TABS: 4.0000 mg | ORAL_TABLET | Freq: Four times a day (QID) | ORAL | Status: DC | PRN

## 2015-04-20 MED ORDER — QUETIAPINE FUMARATE 25 MG PO TABS
25.0000 mg | ORAL_TABLET | Freq: Every day | ORAL | Status: DC
  Administered 2015-04-20 – 2015-04-22 (×3): 25 mg via ORAL
  Filled 2015-04-20 (×3): qty 1

## 2015-04-20 MED ORDER — ACETAMINOPHEN 650 MG RE SUPP: 650.0000 mg | Freq: Four times a day (QID) | RECTAL | Status: DC | PRN

## 2015-04-20 MED ORDER — CLOPIDOGREL BISULFATE 75 MG PO TABS
75.0000 mg | ORAL_TABLET | Freq: Every day | ORAL | Status: DC
  Administered 2015-04-21 – 2015-04-23 (×3): 75 mg via ORAL
  Filled 2015-04-20 (×3): qty 1

## 2015-04-20 MED ORDER — FAMOTIDINE 20 MG PO TABS
20.0000 mg | ORAL_TABLET | Freq: Every day | ORAL | Status: DC
  Administered 2015-04-21 – 2015-04-23 (×3): 20 mg via ORAL
  Filled 2015-04-20 (×3): qty 1

## 2015-04-20 MED ORDER — SODIUM CHLORIDE 0.9% FLUSH
3.0000 mL | Freq: Two times a day (BID) | INTRAVENOUS | Status: DC
  Administered 2015-04-20 – 2015-04-21 (×3): 3 mL via INTRAVENOUS

## 2015-04-20 MED ORDER — SIMVASTATIN 40 MG PO TABS
40.0000 mg | ORAL_TABLET | Freq: Every day | ORAL | Status: DC
  Administered 2015-04-21 – 2015-04-22 (×2): 40 mg via ORAL
  Filled 2015-04-20 (×2): qty 1

## 2015-04-20 MED ORDER — LEVOTHYROXINE SODIUM 75 MCG PO TABS
75.0000 ug | ORAL_TABLET | Freq: Every day | ORAL | Status: DC
  Administered 2015-04-21 – 2015-04-23 (×3): 75 ug via ORAL
  Filled 2015-04-20 (×3): qty 1

## 2015-04-20 NOTE — H&P (Signed)
History and Physical    SHA AMER [name]:[id number] DOB: 1925/06/21 DOA: 04/20/2015  Referring Provider: EDP PCP: [name], MD  Outpatient Specialists: Dr. Terrace Arabia (neurology), Dr. Allyson Sabal (cardiology)   Patient coming from: Home with family   Chief Complaint: Low BP, syncope   HPI: Jeremy [name] is a 82 y.o. male with medical history significant for coronary artery disease with stent, dementia, bladder outlet obstruction with indwelling Foley, chronic diastolic CHF, and chronic kidney disease stage II-III who presents to the ED with hypotension and syncopal episode. Unfortunately, the patient is had a series of recent hospitalizations for syncope and returns with the same. He was initially admitted from 04/13/2015 to 04/14/2015, was orthostatic at that time and dehydrated, responded well to IV fluid and was discharged back home in much improved condition. Unfortunately, he had recurrence and syncope back at home and was readmitted the same day. During the admission from 04/15/2015 to 04/19/2015, he underwent evaluation with echocardiogram and chest CT, neither of which demonstrated significant findings. Syncope was attributed to vasovagal etiology at that time and the patient was discharged home with physical therapy and treatment for a pseudomonal UTI. Patient seemed to be in his usual state, but when home PT visited him today, he was found to have blood pressure of 68/45 and was directed to the emergency department for evaluation of that. Patient reports feeling quite well with no fevers, chills, chest pain, or palpitations. He reports that his appetite has been good and he has been taking his medications as directed.  ED Course: Upon arrival to the ED, patient is found to be afebrile, saturating well on room air, but hypotensive to 62/42 after experiencing a syncopal event in the triage area. EKG feature to sinus rhythm with left axis deviation. CMP is largely unremarkable and CBC features a  hemoglobin of 11.2 which is consistent with his baseline. Lactic acid is elevated to 2.02. Urine is obtained for analysis and features few bacteria, large leukocyte, negative nitrite, and 6-30 WBC. 1 L of normal saline was bolused in the emergency department, urine was sent for culture, and a empiric dose of Rocephin was administered. Patient's blood pressure improved after the fluid bolus and remained in the 120/80 range. Patient remained asymptomatic in the ED and will be admitted under observation status for ongoing evaluation and management of recurrent syncopal episodes with hypotension.  Review of Systems:  All other systems reviewed and apart from HPI, are negative.  Past Medical History  Diagnosis Date  . Sepsis (HCC)   . UTI (lower urinary tract infection)   . Dementia   . Syncope     4/11 (hospitalized) and 4/13    Past Surgical History  Procedure Laterality Date  . Coronary angioplasty with stent placement    . Thyroidectomy, partial    . Renal doppler  08/17/2005    Normal evaluation  . Cardiac catheterization  02/25/2002    Proximal L Circumflex 95% lesion, stented w/ 3x18-mm CYPHER stent avoiding the 2 L Marginal branch, jailing the 1st marginal, resulting in reduction of a 90-95% lesion to 0% residual  . Cardiovascular stress test  12/18/2006    EKG negative for ischemia, no significant ischemia demonstrated.  . Transthoracic echocardiogram  03/13/2002    EF >55%, moderate LVH,   . Back surgery       reports that he has quit smoking. He has never used smokeless tobacco. He reports that he does not drink alcohol or use illicit drugs.  Allergies  Allergen Reactions  . Tramadol Nausea And Vomiting    Family History  Problem Relation Age of Onset  . Family history unknown: Yes     Prior to Admission medications   Medication Sig Start Date End Date Taking? Authorizing Provider  aspirin EC 81 MG tablet Take 81 mg by mouth daily.    Historical Provider, MD    ciprofloxacin (CIPRO) 500 MG tablet Take 1 tablet (500 mg total) by mouth 2 (two) times daily. 04/19/15   Dorothea Ogle, MD  clopidogrel (PLAVIX) 75 MG tablet Take 1 tablet (75 mg total) by mouth daily. 10/30/14   Runell Gess, MD  CRANBERRY PO Take 1 tablet by mouth daily.    Historical Provider, MD  dorzolamide (TRUSOPT) 2 % ophthalmic solution Place 1 drop into the right eye 2 (two) times daily.  08/23/12   Historical Provider, MD  finasteride (PROSCAR) 5 MG tablet Take 1 tablet (5 mg total) by mouth daily. Patient taking differently: Take 5 mg by mouth every evening.  10/13/14   Albertine Grates, MD  isosorbide mononitrate (IMDUR) 30 MG 24 hr tablet Take 1 tablet (30 mg total) by mouth daily. 10/30/14   Runell Gess, MD  latanoprost (XALATAN) 0.005 % ophthalmic solution Place 1 drop into both eyes at bedtime.    Historical Provider, MD  levothyroxine (SYNTHROID, LEVOTHROID) 75 MCG tablet Take 75 mcg by mouth daily. 11/06/14   Historical Provider, MD  losartan (COZAAR) 50 MG tablet Take 50 mg by mouth daily.    Historical Provider, MD  meloxicam (MOBIC) 15 MG tablet Take 15 mg by mouth daily. 02/17/15   Historical Provider, MD  metoprolol succinate (TOPROL-XL) 50 MG 24 hr tablet Take  (one-half tablet) once daily by mouth with or immediately following a meal. Patient taking differently: Take 50 mg by mouth daily. Take  (one-half tablet) once daily by mouth with or immediately following a meal. 03/22/15   Runell Gess, MD  QUEtiapine (SEROQUEL) 25 MG tablet Take 1 tablet (25 mg total) by mouth at bedtime. For behaviors 02/25/15   Levert Feinstein, MD  ranitidine (ZANTAC) 75 MG tablet Take 75 mg by mouth daily.    Historical Provider, MD  simvastatin (ZOCOR) 40 MG tablet Take 40 mg by mouth daily at 6 PM.     Historical Provider, MD  timolol (TIMOPTIC) 0.5 % ophthalmic solution Place 1 drop into both eyes 2 (two) times daily.  09/18/12   Historical Provider, MD  Vitamin D, Ergocalciferol, (DRISDOL)  50000 units CAPS capsule Take 50,000 Units by mouth 2 (two) times a week.    Historical Provider, MD    Physical Exam: Filed Vitals:   04/20/15 1752 04/20/15 1800 04/20/15 1830 04/20/15 1900  BP: 120/77 133/83 128/87 125/88  Pulse: 73 77 77 80  Temp:      TempSrc:      Resp: SpO2: 99% 99% 98% 100%      Constitutional: NAD, calm, comfortable. Very thin, bitemporal wasting Eyes: PERTLA, lids and conjunctivae normal ENMT: Mucous membranes are moist. Posterior pharynx clear of any exudate or lesions.   Neck: normal, supple, no masses, no thyromegaly Respiratory: clear to auscultation bilaterally, no wheezing, no crackles. Normal respiratory effort. No accessory muscle use.  Cardiovascular: S1 & S2 heard, regular rate and rhythm, grade III holosystolic murmur at apex, no rubs / gallops. No extremity edema. 2+ pedal pulses. No carotid bruits.  Abdomen: No distension, no tenderness, no masses palpated. No  hepatosplenomegaly. Bowel sounds normal.  Musculoskeletal: no clubbing / cyanosis. No joint deformity upper and lower extremities. Good ROM, no contractures. Normal muscle tone.  Skin: no rashes, lesions, ulcers. No induration Neurologic: CN 2-12 grossly intact. Sensation intact, DTR normal. Strength 5/5 in all 4 limbs.  Psychiatric: Normal judgment and insight. Alert and oriented x 3. Normal mood.     Labs on Admission: I have personally reviewed following labs and imaging studies  CBC:  Recent Labs Lab 04/13/15 2055  04/15/15 1509 04/15/15 2026 04/16/15 0455 04/17/15 0242 04/20/15 1612  WBC 6.8  < > 5.7 5.9 6.2 6.1 6.6  NEUTROABS 3.9  --   --   --   --   --  3.7  HGB 10.5*  < > 10.3* 10.6* 10.7* 10.3* 11.2*  HCT 32.6*  < > 32.8* 33.3* 33.5* 31.2* 35.2*  MCV 88.3  < > 88.9 88.1 88.4 89.1 88.7  PLT 204  < > 176 188 169 165 160  < > = values in this interval not displayed. Basic Metabolic Panel:  Recent Labs Lab 04/15/15 0453 04/15/15 1509 04/15/15 2026  04/16/15 0455 04/17/15 0242 04/20/15 1612  NA 140 140  --  141 140 137  K 4.1 3.7  --  3.9 4.0 4.7  CL 107 108  --  106 109 106  CO2 23 21*  --  26 21* 20*  GLUCOSE 85 90  --  80 75 82  BUN 15 15  --  CREATININE 0.98 0.95 0.90 1.07 1.07 1.22  CALCIUM 8.6* 8.6*  --  8.9 8.6* 9.3  MG  --   --  1.7  --   --   --   PHOS  --   --  2.4*  --   --   --    GFR: Estimated Creatinine Clearance: 45.1 mL/min (by C-G formula based on Cr of 1.22). Liver Function Tests:  Recent Labs Lab 04/13/15 2055 04/20/15 1612  AST 37 29  ALT 15* 11*  ALKPHOS 61 62  BILITOT 1.2 1.0  PROT 7.1 7.8  ALBUMIN 3.2* 3.2*   No results for input(s): LIPASE, AMYLASE in the last 168 hours. No results for input(s): AMMONIA in the last 168 hours. Coagulation Profile: No results for input(s): INR, PROTIME in the last 168 hours. Cardiac Enzymes:  Recent Labs Lab 04/14/15 1551 04/15/15 1531 04/18/15 1810 04/18/15 2311 04/19/15 0530  TROPONINI 0.06* <0.03 <0.03 <0.03 <0.03   BNP (last 3 results) No results for input(s): PROBNP in the last 8760 hours. HbA1C: No results for input(s): HGBA1C in the last 72 hours. CBG:  Recent Labs Lab 04/16/15 0649 04/17/15 0631 04/18/15 0602 04/19/15 0534 04/20/15 1614  GLUCAP 83 68 79 70 79   Lipid Profile: No results for input(s): CHOL, HDL, LDLCALC, TRIG, CHOLHDL, LDLDIRECT in the last 72 hours. Thyroid Function Tests: No results for input(s): TSH, T4TOTAL, FREET4, T3FREE, THYROIDAB in the last 72 hours. Anemia Panel: No results for input(s): VITAMINB12, FOLATE, FERRITIN, TIBC, IRON, RETICCTPCT in the last 72 hours. Urine analysis:    Component Value Date/Time   COLORURINE YELLOW 04/20/2015 1720   APPEARANCEUR CLOUDY* 04/20/2015 1720   LABSPEC 1.014 04/20/2015 1720   PHURINE 5.5 04/20/2015 1720   GLUCOSEU NEGATIVE 04/20/2015 1720   HGBUR NEGATIVE 04/20/2015 1720   BILIRUBINUR NEGATIVE 04/20/2015 1720   KETONESUR NEGATIVE 04/20/2015 1720    PROTEINUR NEGATIVE 04/20/2015 1720   UROBILINOGEN 0.2 11/08/2014 0334   NITRITE NEGATIVE 04/20/2015 1720  LEUKOCYTESUR LARGE* 04/20/2015 1720   Sepsis Labs: @LABRCNTIP (procalcitonin:4,lacticidven:4) ) Recent Results (from the past 240 hour(s))  Blood Culture (routine x 2)     Status: None   Collection Time: 04/13/15 11:13 PM  Result Value Ref Range Status   Specimen Description BLOOD LEFT ARM  Final   Special Requests IN PEDIATRIC BOTTLE  Final   Culture NO GROWTH 5 DAYS  Final   Report Status 04/18/2015 FINAL  Final  Blood Culture (routine x 2)     Status: None   Collection Time: 04/13/15 11:18 PM  Result Value Ref Range Status   Specimen Description BLOOD LEFT ARM  Final   Special Requests IN PEDIATRIC BOTTLE  Final   Culture NO GROWTH 5 DAYS  Final   Report Status 04/18/2015 FINAL  Final  Urine culture     Status: Abnormal   Collection Time: 04/13/15 11:52 PM  Result Value Ref Range Status   Specimen Description URINE, RANDOM  Final   Special Requests NONE  Final   Culture (A)  Final    >=100,000 COLONIES/mL PSEUDOMONAS AERUGINOSA 50,000 COLONIES/mL ENTEROCOCCUS SPECIES    Report Status 04/18/2015 FINAL  Final   Organism ID, Bacteria PSEUDOMONAS AERUGINOSA (A)  Final   Organism ID, Bacteria ENTEROCOCCUS SPECIES (A)  Final      Susceptibility   Pseudomonas aeruginosa - MIC*    CEFTAZIDIME 4 SENSITIVE Sensitive     CIPROFLOXACIN <=0.25 SENSITIVE Sensitive     GENTAMICIN 2 SENSITIVE Sensitive     IMIPENEM 2 SENSITIVE Sensitive     PIP/TAZO 8 SENSITIVE Sensitive     CEFEPIME 4 SENSITIVE Sensitive     * >=100,000 COLONIES/mL PSEUDOMONAS AERUGINOSA   Enterococcus species - MIC*    AMPICILLIN <=2 SENSITIVE Sensitive     LEVOFLOXACIN >=8 RESISTANT Resistant     NITROFURANTOIN <=16 SENSITIVE Sensitive     VANCOMYCIN 1 SENSITIVE Sensitive     * 50,000 COLONIES/mL ENTEROCOCCUS SPECIES  MRSA PCR Screening     Status: Abnormal   Collection Time: 04/14/15  2:54 AM    Result Value Ref Range Status   MRSA by PCR POSITIVE (A) NEGATIVE Final    Comment:        The GeneXpert MRSA Assay (FDA approved for NASAL specimens only), is one component of a comprehensive MRSA colonization surveillance program. It is not intended to diagnose MRSA infection nor to guide or monitor treatment for MRSA infections. RESULT CALLED TO, READ BACK BY AND VERIFIED WITH: M RUDISILL,RN @0623  04/14/15 MKELLY      Radiological Exams on Admission: No results found.  EKG: Independently reviewed. Sinus rhythm, LAD   Assessment/Plan  1. Hypotension  - Directed to the ED today by home PT after BP noted to be 68/45  - BP 62/42 in ED triage area after a syncopal episode in the waiting area - There has been no CP, palpitations, confusion, or lightheadedness; pt denies sxs prior to syncopal episodes - Suspected secondary autonomic dysfunction, exacerbated by medications and dehydration  - Was given 1 L NS bolus in ED and has remained normotensive since  - Continue IVF hydration with NS at 100 cc/hr overnight  - Hold Imdur, metoprolol, losartan for now; resume (perhaps at reduced-dose) once stable    2. Recurrent syncope  - Suspected secondary to hypotension in setting of likely autonomic dysfunction, dehydration, and medications  - Monitor on telemetry   - Continue IV hydration with NS at 100 cc/hr overnight  - Hold metoprolol, Imdur,  and losartan for now - TTE just performed on 04/16/15; normal EF, grade 1 diastolic dysfunction   3. CKD II-III  - SCr 1.22 on admission, up from apparent baseline of 0.9  - Suspect the bump is secondary to dehydration and anticipate return to baseline with IVF  - Repeat chem panel in am    4. UTI, catheter-related   - Culture from 04/18/15 growing >100k cfu pseudomonas and 50k cfu Enterococcus from catheter-obtained specimen  - Was discharged with Cipro, to which the Enterococcal colonies are resistant  - Continue treatment with Rocephin  IV   5. CAD - Suffered NSTEMI in 2004 with DES placed to Cfx; 50% distal RCA lesion also noted at that time  - No angina or acute EKG findings  - Continue ASA 81, Plavix, Zocor  - Holding Imdur, losartan, and metoprolol in setting of hypotension and syncope   6. Anemia  - Secondary to chronic disease  - Hgb is 11.2 on admission, consistent with his baseline  - No sign of active blood loss    7. Dementia with behavioral disturbance  - Appears to be stable  - Continue Seroquel qHS    8. Chronic diastolic CHF  - TTE (04/16/15) with EF 55-60%, grade 1 diastolic dysfunction, no significant valvular disease  - Appears dry on admission  - Strict I/Os, daily wts  - Received 1 L NS in ED, continuing with NS at 100 cc/hr overnight    DVT prophylaxis: sq Lovenox  Code Status: Full  Family Communication: Daughters at the bedside   Disposition Plan: Admit to telemetry   Consults called: None   Admission status: Observation     Jeremy Deutscherimothy S Jasmina Gendron MD [facility] Pager [phone]  If 7PM-7AM, please contact night-coverage [email or web address] Password [email or web address]  04/20/2015, 7:42 PM

## 2015-04-20 NOTE — ED Notes (Signed)
[facility] notified @ 1557pm. Spoke w/Thomas.

## 2015-04-20 NOTE — ED Notes (Signed)
Family at bedside. 

## 2015-04-20 NOTE — ED Notes (Signed)
Patient was out in triage and patients family was calling for a RN because patient went unresponsive. At this time patient was semi-responsive and not alert to verbal stimuli. Rechecked BP in triage and read 62/42. Patient had another absent episode where he would not respond and was pale and diaphoretic. Notified charge and taken to Trauma [facility]

## 2015-04-20 NOTE — Telephone Encounter (Signed)
Returned call - pt was brought to ED by caller. Advised since he is there already, to stay for evaluation & assessment of symptoms & allow MD to investigate cause of his recurrent BP drops. Caller acknowledged.

## 2015-04-20 NOTE — ED Provider Notes (Signed)
CSN: [id number]     Arrival date & time 04/20/15  1534 History   First MD Initiated Contact with Patient 04/20/15 1620     Chief Complaint  Patient presents with  . Hypotension     (Consider location/radiation/quality/duration/timing/severity/associated sxs/prior Treatment) HPI This is an 82 year old male who presents following a syncopal episode. The patient has had 2 recent admissions for syncope, diagnosed with sepsis due to urinary tract infection, currently on Cipro. He was most recently discharged last night, and family said he was doing well until this afternoon when the physical therapist came to start PT. He was feeling weak, and the physical therapist took his blood pressure and found to be 70/30. He lost consciousness shortly thereafter for 1-2 minutes, and EMS was called. He had an additional loss of consciousness with EMS, at that time they noted he was hypotensive to the 80s systolic. He had an additional loss of consciousness while here in the emergency department, at that time he was documented to have blood pressure in the 60s systolic. In between these episodes he says he feels "fine. He does have dementia, but family says that he is slightly more confused and sleepy right now than usual. In between his syncopal episodes he returns to a normal blood pressure. He has had no fevers since his discharge.   Past Medical History  Diagnosis Date  . Sepsis (HCC)   . UTI (lower urinary tract infection)   . Dementia   . Syncope     4/11 (hospitalized) and 4/13   Past Surgical History  Procedure Laterality Date  . Coronary angioplasty with stent placement    . Thyroidectomy, partial    . Renal doppler  08/17/2005    Normal evaluation  . Cardiac catheterization  02/25/2002    Proximal L Circumflex 95% lesion, stented w/ 3x18-mm CYPHER stent avoiding the 2 L Marginal branch, jailing the 1st marginal, resulting in reduction of a 90-95% lesion to 0% residual  . Cardiovascular stress  test  12/18/2006    EKG negative for ischemia, no significant ischemia demonstrated.  . Transthoracic echocardiogram  03/13/2002    EF >55%, moderate LVH,   . Back surgery     Family History  Problem Relation Age of Onset  . Family history unknown: Yes   Social History  Substance Use Topics  . Smoking status: Former Games developer  . Smokeless tobacco: Never Used     Comment: Quit 50+ years ago.  . Alcohol Use: No    Review of Systems  Unable to perform ROS: Dementia  Neurological: Positive for syncope.      Allergies  Tramadol  Home Medications   Prior to Admission medications   Medication Sig Start Date End Date Taking? Authorizing Provider  aspirin EC 81 MG tablet Take 81 mg by mouth daily.    Historical Provider, MD  ciprofloxacin (CIPRO) 500 MG tablet Take 1 tablet (500 mg total) by mouth 2 (two) times daily. 04/19/15   Dorothea Ogle, MD  clopidogrel (PLAVIX) 75 MG tablet Take 1 tablet (75 mg total) by mouth daily. 10/30/14   Runell Gess, MD  CRANBERRY PO Take 1 tablet by mouth daily.    Historical Provider, MD  dorzolamide (TRUSOPT) 2 % ophthalmic solution Place 1 drop into the right eye 2 (two) times daily.  08/23/12   Historical Provider, MD  finasteride (PROSCAR) 5 MG tablet Take 1 tablet (5 mg total) by mouth daily. Patient taking differently: Take 5 mg by mouth every  evening.  10/13/14   Albertine GratesFang Xu, MD  isosorbide mononitrate (IMDUR) 30 MG 24 hr tablet Take 1 tablet (30 mg total) by mouth daily. 10/30/14   Runell GessJonathan J Berry, MD  latanoprost (XALATAN) 0.005 % ophthalmic solution Place 1 drop into both eyes at bedtime.    Historical Provider, MD  levothyroxine (SYNTHROID, LEVOTHROID) 75 MCG tablet Take 75 mcg by mouth daily. 11/06/14   Historical Provider, MD  losartan (COZAAR) 50 MG tablet Take 50 mg by mouth daily.    Historical Provider, MD  meloxicam (MOBIC) 15 MG tablet Take 15 mg by mouth daily. 02/17/15   Historical Provider, MD  metoprolol succinate (TOPROL-XL) 50 MG  24 hr tablet Take 25mg  (one-half tablet) once daily by mouth with or immediately following a meal. Patient taking differently: Take 50 mg by mouth daily. Take 25mg  (one-half tablet) once daily by mouth with or immediately following a meal. 03/22/15   Runell GessJonathan J Berry, MD  QUEtiapine (SEROQUEL) 25 MG tablet Take 1 tablet (25 mg total) by mouth at bedtime. For behaviors 02/25/15   Levert FeinsteinYijun Yan, MD  ranitidine (ZANTAC) 75 MG tablet Take 75 mg by mouth daily.    Historical Provider, MD  simvastatin (ZOCOR) 40 MG tablet Take 40 mg by mouth daily at 6 PM.     Historical Provider, MD  timolol (TIMOPTIC) 0.5 % ophthalmic solution Place 1 drop into both eyes 2 (two) times daily.  09/18/12   Historical Provider, MD  Vitamin D, Ergocalciferol, (DRISDOL) 50000 units CAPS capsule Take 50,000 Units by mouth 2 (two) times a week.    Historical Provider, MD   BP 149/91 mmHg  Pulse 104  Temp(Src) 97.5 F (36.4 C) (Oral)  Resp 11  SpO2 98% Physical Exam  Constitutional: He appears well-developed.  Elderly, chronically ill-appearing  HENT:  Head: Normocephalic and atraumatic.  Dry mucous membranes  Eyes: Pupils are equal, round, and reactive to light.  Neck: Normal range of motion. No JVD present.  Cardiovascular: Normal rate, regular rhythm, normal heart sounds and intact distal pulses.   Pulmonary/Chest: Effort normal and breath sounds normal. No respiratory distress.  Abdominal: Soft. He exhibits no distension. There is no tenderness.  Musculoskeletal: He exhibits no edema.  Neurological: He is alert.  Disoriented, but at baseline by her family  Skin: Skin is warm and dry.  Nursing note and vitals reviewed.   ED Course  Procedures (including critical care time) Labs Review Labs Reviewed  COMPREHENSIVE METABOLIC PANEL - Abnormal; Notable for the following:    CO2 20 (*)    Albumin 3.2 (*)    ALT 11 (*)    GFR calc non Af Amer 51 (*)    GFR calc Af Amer 60 (*)    All other components within normal  limits  CBC WITH DIFFERENTIAL/PLATELET - Abnormal; Notable for the following:    RBC 3.97 (*)    Hemoglobin 11.2 (*)    HCT 35.2 (*)    RDW 16.9 (*)    All other components within normal limits  URINALYSIS, ROUTINE W REFLEX MICROSCOPIC (NOT [facility]) - Abnormal; Notable for the following:    APPearance CLOUDY (*)    Leukocytes, UA LARGE (*)    All other components within normal limits  URINE MICROSCOPIC-ADD ON - Abnormal; Notable for the following:    Squamous Epithelial / LPF 0-5 (*)    Bacteria, UA FEW (*)    All other components within normal limits  I-STAT CG4 LACTIC ACID, ED - Abnormal;  Notable for the following:    Lactic Acid, Venous 2.02 (*)    All other components within normal limits  URINE CULTURE  MRSA PCR SCREENING  PROCALCITONIN  TROPONIN I  CORTISOL  BASIC METABOLIC PANEL  TROPONIN I  TROPONIN I  CBG MONITORING, ED  I-STAT CG4 LACTIC ACID, ED    Imaging Review No results found. I have personally reviewed and evaluated these images and lab results as part of my medical decision-making.   EKG Interpretation   Date/Time:  Tuesday April 20 2015 16:15:55 EDT Ventricular Rate:  66 PR Interval:  130 QRS Duration: 112 QT Interval:  421 QTC Calculation: 441 R Axis:   -49 Text Interpretation:  Sinus rhythm LAD, consider left anterior fascicular  block Anteroseptal infarct, age indeterminate Confirmed by Rubin Payor  MD,  NATHAN [phone]) on 04/20/2015 5:13:48 PM      MDM   Final diagnoses:  Syncope and collapse   82 year old male here with syncope, intermittent hypotension. The patient has had 2 recent inpatient stays for syncope during which she was normotensive, diagnosed with urinary tract infection, and started on antibiotics. I've reviewed the cultures, currently is growing enterococcus and pseudomonas sensitive to ciprofloxacin and Rocephin.  He has not had his evening dose of medication, we'll dose Rocephin once now. He also is on multiple  antihypertensives which may be contributing to his hypotension. While examining the patient, after sitting him up in bed, he became hypotensive and syncopized again. His hypotension resolved completely after IV fluids. Troponin not elevated, EKG with no signs of ischemia, no fever or tachycardia to suggest that this is sepsis. We will plan to admit for observation to hold his blood pressure medications and monitor his blood pressure overnight.     Erskine Emery, MD 04/21/15 6045  Benjiman Core, MD 04/21/15 [phone]

## 2015-04-20 NOTE — ED Notes (Signed)
Pt was hospitalized for syncope-went home-had another syncope-came back to ED (this was on the same day)- admitted with UTI last Thursday and d/c yesterday.  Today PT came to home, noted pt to be hypotensive 70/50.  Pt was feeling all right but brought in for hypotension not (2 recent hospitalizations)

## 2015-04-20 NOTE — ED Notes (Signed)
EMT noted that pt. Had a period of 'staring off' where he was not responsive to voice at that time. EDP made aware.

## 2015-04-20 NOTE — ED Notes (Signed)
CBG 79 

## 2015-04-20 NOTE — Telephone Encounter (Signed)
New message      Pt c/o BP issue: STAT if pt c/o blurred vision, one-sided weakness or slurred speech  1. What are your last 5 BP readings?68/45 2. Are you having any other symptoms (ex. Dizziness, headache, blurred vision, passed out)? no 3. What is your BP issue? Pt was discharged from the hosp yesterday.  Today the physical therapist came to see pt and his bp was 68/45.  Please advise.

## 2015-04-21 ENCOUNTER — Encounter (HOSPITAL_COMMUNITY): Payer: Self-pay | Admitting: Physician Assistant

## 2015-04-21 DIAGNOSIS — N390 Urinary tract infection, site not specified: Secondary | ICD-10-CM

## 2015-04-21 DIAGNOSIS — N183 Chronic kidney disease, stage 3 (moderate): Secondary | ICD-10-CM | POA: Diagnosis not present

## 2015-04-21 DIAGNOSIS — R55 Syncope and collapse: Secondary | ICD-10-CM | POA: Diagnosis not present

## 2015-04-21 DIAGNOSIS — N400 Enlarged prostate without lower urinary tract symptoms: Secondary | ICD-10-CM | POA: Diagnosis not present

## 2015-04-21 DIAGNOSIS — E785 Hyperlipidemia, unspecified: Secondary | ICD-10-CM

## 2015-04-21 DIAGNOSIS — F0391 Unspecified dementia with behavioral disturbance: Secondary | ICD-10-CM

## 2015-04-21 DIAGNOSIS — T83511A Infection and inflammatory reaction due to indwelling urethral catheter, initial encounter: Secondary | ICD-10-CM

## 2015-04-21 DIAGNOSIS — N179 Acute kidney failure, unspecified: Secondary | ICD-10-CM | POA: Diagnosis not present

## 2015-04-21 DIAGNOSIS — I10 Essential (primary) hypertension: Secondary | ICD-10-CM

## 2015-04-21 DIAGNOSIS — I2510 Atherosclerotic heart disease of native coronary artery without angina pectoris: Secondary | ICD-10-CM | POA: Diagnosis not present

## 2015-04-21 DIAGNOSIS — I5032 Chronic diastolic (congestive) heart failure: Secondary | ICD-10-CM | POA: Diagnosis not present

## 2015-04-21 DIAGNOSIS — D631 Anemia in chronic kidney disease: Secondary | ICD-10-CM

## 2015-04-21 LAB — TSH: TSH: 2.759 u[IU]/mL (ref 0.350–4.500)

## 2015-04-21 LAB — GLUCOSE, CAPILLARY
GLUCOSE-CAPILLARY: 83 mg/dL (ref 65–99)
GLUCOSE-CAPILLARY: 97 mg/dL (ref 65–99)
Glucose-Capillary: 82 mg/dL (ref 65–99)
Glucose-Capillary: 109 mg/dL — ABNORMAL HIGH (ref 65–99)

## 2015-04-21 LAB — TROPONIN I

## 2015-04-21 LAB — BASIC METABOLIC PANEL
Anion gap: 12 (ref 5–15)
BUN: 16 mg/dL (ref 6–20)
CALCIUM: 8.7 mg/dL — AB (ref 8.9–10.3)
CHLORIDE: 108 mmol/L (ref 101–111)
CO2: 19 mmol/L — ABNORMAL LOW (ref 22–32)
CREATININE: 0.92 mg/dL (ref 0.61–1.24)
GFR calc non Af Amer: >60 mL/min (ref >60)
Glucose, Bld: 78 mg/dL (ref 65–99)
Potassium: 3.8 mmol/L (ref 3.5–5.1)
SODIUM: 139 mmol/L (ref 135–145)

## 2015-04-21 LAB — URINE CULTURE: Culture: NO GROWTH

## 2015-04-21 LAB — MRSA PCR SCREENING: MRSA by PCR: POSITIVE — AB

## 2015-04-21 NOTE — Care Management Obs Status (Signed)
MEDICARE OBSERVATION STATUS NOTIFICATION   Patient Details  Name: Jeremy [name] MRN: [id number] Date of Birth: Jan 01, 1926   Medicare Observation Status Notification Given:  Yes    Darrold SpanWebster, Joshlyn Beadle Hall, RN 04/21/2015, 3:11 PM

## 2015-04-21 NOTE — Progress Notes (Signed)
Patient tolerated orthostatic BP's fairly well but we could not get him to do the standing pressure.  Tried to life patient twice. We were able to obtain lying and sitting which were wnl. Lying: 126/72, 81, sitting: 148/85, 87 Jeremy [name] E Lindsey Demonte

## 2015-04-21 NOTE — Consult Note (Signed)
CARDIOLOGY CONSULT NOTE   Patient ID: Jeremy [name] MRN: [id number] DOB/AGE: [birth year] 80 y.o.  Admit date: 04/20/2015  Primary Physician   [name], MD Primary Cardiologist   Dr. Allyson Sabal Reason for Consultation   Syncope Requesting Physician  Dr. Elvera Lennox  HPI: Jeremy [name] is a 82 y.o. male with a history of CAD. HTN, chronic diastolic CHF, and chronic kidney disease stage II-III, dementia, HTN, HL, bladder outlet obstruction with indwelling Foley and 2 recent admission this month who was presented to [facility] ED 04/19/17 for evaluation of hypotension.   History of coronary artery disease status post non-ST segment elevation myocardial infarction February 2004 treated with stenting of the circumflex using a Cypher drug-eluting stent. At that time he had 50% distal RCA lesion and normal LV function. He also had a 40% right renal artery stenosis. He was doing well on cardiac stand point when seen last by Dr. Allyson Sabal 10/20/2014.   The patient was admitted 04/13/15-04/14/15 4 syncope episode --> felt due to hypotension and he was orthostatic in that time he was hydrated and discharged home on stable condition,  However,  admitted again 04/15/15 to 04/19/15 again for syncope episode while sitting at  [place] table. CT of chest was unremarkable. Echocardiogram done 04/16/2015 shows left ventricular function of 55-60%, no wall motion abnormality, grade 1 diastolic dysfunction, mild mitral regurgitation, mildly dilated left atrium and PA peak pressure of 31 mmHg. His syncope was attributed to vasovagal etiology at that time and the patient was discharged home 4/17 with physical therapy and treatment (Cipro) for a pseudomonal UTI.   The patient was noted to be hypotensive 70/50 by PT at home yesterday 4/18 --> then sudden episode of syncope -->and sent to ED for further evaluation by EMS. His blood pressure was 115/70 in ED upon presentation however while waiting in Triage Afterwards he had sudden episode of  unresponsiveness for about 2 minutes. Per note " the patient was staring off, pale and diaphoretic with BP of 62/42" and CBG of 79". He was given 1L of NS in ED with improvement in BP.  Lactic acid of 2.02. Hgb of 11.2 which appears at baseline. UA concerning for UTI. Pending culture. Treated with IV Rocephin. The patient admitted for observation. BP is since then. Troponin x 3 negative. Normal plasma cortisol. Lytes normal. EKG showed sinus rhythm at rate of 66bpm. No acute changes.  Held home BP medications.    Per family at bed side. Appetite is good. Witnessed sudden episode of syncope without prodrome. The patient denies nausea, vomiting, fever, chest pain, palpitations, shortness of breath, orthopnea, PND, dizziness, syncope, cough, congestion, abdominal pain, hematochezia, melena, lower extremity edema. No complains of dyspnea or chest pain with PT.    Past Medical History  Diagnosis Date  . Sepsis (HCC)   . UTI (lower urinary tract infection)   . Dementia   . Syncope     4/11 (hospitalized) and 4/13  . CAD (coronary artery disease)   . Hypotension   . Syncope   . Hypothyroidism     s/p Thyroidectomy, partial     Past Surgical History  Procedure Laterality Date  . Coronary angioplasty with stent placement    . Thyroidectomy, partial    . Renal doppler  08/17/2005    Normal evaluation  . Cardiac catheterization  02/25/2002    Proximal L Circumflex 95% lesion, stented w/ 3x18-mm CYPHER stent avoiding the 2 L Marginal branch, jailing the 1st marginal, resulting in reduction  of a 90-95% lesion to 0% residual  . Cardiovascular stress test  12/18/2006    EKG negative for ischemia, no significant ischemia demonstrated.  . Transthoracic echocardiogram  03/13/2002    EF >55%, moderate LVH,   . Back surgery      Allergies  Allergen Reactions  . Tramadol Nausea And Vomiting    I have reviewed the patient's current medications . aspirin EC  81 mg Oral Daily  . cefTRIAXone (ROCEPHIN)   IV  1 g Intravenous Q24H  . clopidogrel  75 mg Oral Daily  . dorzolamide  1 drop Right Eye BID  . enoxaparin (LOVENOX) injection  40 mg Subcutaneous Q24H  . famotidine  20 mg Oral Daily  . finasteride  5 mg Oral QPM  . latanoprost  1 drop Both Eyes QHS  . levothyroxine  75 mcg Oral QAC breakfast  . QUEtiapine  25 mg Oral QHS  . simvastatin  40 mg Oral q1800  . sodium chloride flush  3 mL Intravenous Q12H  . timolol  1 drop Both Eyes BID  . [START ON 04/22/2015] Vitamin D (Ergocalciferol)  50,000 Units Oral Once per day on Mon Thu     acetaminophen **OR** acetaminophen, bisacodyl, HYDROcodone-acetaminophen, ondansetron **OR** ondansetron (ZOFRAN) IV, polyethylene glycol  Prior to Admission medications   Medication Sig Start Date End Date Taking? Authorizing Provider  aspirin EC 81 MG tablet Take 81 mg by mouth daily.   Yes Historical Provider, MD  ciprofloxacin (CIPRO) 500 MG tablet Take 1 tablet (500 mg total) by mouth 2 (two) times daily. 04/19/15  Yes Dorothea Ogle, MD  clopidogrel (PLAVIX) 75 MG tablet Take 1 tablet (75 mg total) by mouth daily. 10/30/14  Yes Runell Gess, MD  CRANBERRY PO Take 1 tablet by mouth daily.   Yes Historical Provider, MD  dorzolamide (TRUSOPT) 2 % ophthalmic solution Place 1 drop into the right eye 2 (two) times daily.  08/23/12  Yes Historical Provider, MD  finasteride (PROSCAR) 5 MG tablet Take 1 tablet (5 mg total) by mouth daily. Patient taking differently: Take 5 mg by mouth every evening.  10/13/14  Yes Albertine Grates, MD  isosorbide mononitrate (IMDUR) 30 MG 24 hr tablet Take 1 tablet (30 mg total) by mouth daily. 10/30/14  Yes Runell Gess, MD  latanoprost (XALATAN) 0.005 % ophthalmic solution Place 1 drop into both eyes at bedtime.   Yes Historical Provider, MD  levothyroxine (SYNTHROID, LEVOTHROID) 75 MCG tablet Take 75 mcg by mouth daily. 11/06/14  Yes Historical Provider, MD  losartan (COZAAR) 50 MG tablet Take 50 mg by mouth daily.   Yes  Historical Provider, MD  meloxicam (MOBIC) 15 MG tablet Take 15 mg by mouth daily. 02/17/15  Yes Historical Provider, MD  metoprolol succinate (TOPROL-XL) 50 MG 24 hr tablet Take 25mg  (one-half tablet) once daily by mouth with or immediately following a meal. Patient taking differently: Take 50 mg by mouth daily. Take 25mg  (one-half tablet) once daily by mouth with or immediately following a meal. 03/22/15  Yes Runell Gess, MD  QUEtiapine (SEROQUEL) 25 MG tablet Take 1 tablet (25 mg total) by mouth at bedtime. For behaviors 02/25/15  Yes Levert Feinstein, MD  ranitidine (ZANTAC) 75 MG tablet Take 75 mg by mouth daily.   Yes Historical Provider, MD  simvastatin (ZOCOR) 40 MG tablet Take 40 mg by mouth daily at 6 PM.    Yes Historical Provider, MD  timolol (TIMOPTIC) 0.5 % ophthalmic solution Place 1 drop  into both eyes 2 (two) times daily.  09/18/12  Yes Historical Provider, MD  Vitamin D, Ergocalciferol, (DRISDOL) 50000 units CAPS capsule Take 50,000 Units by mouth 2 (two) times a week.   Yes Historical Provider, MD     Social History   Social History  . Marital Status: Married    Spouse Name: N/A  . Number of Children: 4  . Years of Education: HS   Occupational History  . Retired    Social History Main Topics  . Smoking status: Former Games developermoker  . Smokeless tobacco: Never Used     Comment: Quit 50+ years ago.  . Alcohol Use: No  . Drug Use: No  . Sexual Activity: Not on file   Other Topics Concern  . Not on file   Social History Narrative   Lives with his wife.   Right-handed.   No caffeine use.    Family Status  Relation Status Death Age  . Mother Deceased 5577    Unsure  . Father Deceased 3671    Unsure   Family History  Problem Relation Age of Onset  . Family history unknown: Yes     ROS:  Full 14 point review of systems complete and found to be negative unless listed above.  Physical Exam: Blood pressure 126/72, pulse 81, temperature 97.9 F (36.6 C), temperature source  Oral, resp. rate 18, weight 136 lb 0.4 oz (61.7 kg), SpO2 100 %.  General: Frail ill appearing  male in no acute distress Head: Eyes PERRLA, No xanthomas. Normocephalic and atraumatic, oropharynx without edema or exudate.  Lungs: Resp regular and unlabored, CTA. Heart: RRR no s3, s4, or murmurs..   Neck: No carotid bruits. No lymphadenopathy. No  JVD. Abdomen: Bowel sounds present, abdomen soft and non-tender without masses or hernias noted. Msk:  No spine or cva tenderness. No weakness, no joint deformities or effusions. Extremities: No clubbing, cyanosis or edema. DP/PT/Radials 2+ and equal bilaterally. Neuro: Alert and oriented X 3. No focal deficits noted. Psych:  Good affect, responds appropriately Skin: No rashes or lesions noted.  Labs:   Lab Results  Component Value Date   WBC 6.6 04/20/2015   HGB 11.2* 04/20/2015   HCT 35.2* 04/20/2015   MCV 88.7 04/20/2015   PLT 160 04/20/2015   No results for input(s): INR in the last 72 hours.   Recent Labs Lab 04/20/15 1612 04/21/15 0758  NA 137 139  K 4.7 3.8  CL 106 108  CO2 20* 19*  BUN 15 16  CREATININE 1.22 0.92  CALCIUM 9.3 8.7*  PROT 7.8  --   BILITOT 1.0  --   ALKPHOS 62  --   ALT 11*  --   AST 29  --   GLUCOSE 82 78  ALBUMIN 3.2*  --    MAGNESIUM  Date Value Ref Range Status  04/15/2015 1.7 1.7 - 2.4 mg/dL Final    Recent Labs  16/10/9602/17/17 0530 04/20/15 2055 04/21/15 0247 04/21/15 0758  TROPONINI <0.03 <0.03 <0.03 <0.03   No results for input(s): TROPIPOC in the last 72 hours. No results found for: PROBNP Lab Results  Component Value Date   CHOL 123 12/01/2014   HDL 44 12/01/2014   LDLCALC 60 12/01/2014   TRIG 86 12/01/2014   No results found for: DDIMER LIPASE  Date/Time Value Ref Range Status  01/26/2015 12:55 AM 30 11 - 51 U/L Final    VITAMIN B-12  Date/Time Value Ref Range Status  11/07/2014 09:10 AM  396 180 - 914 pg/mL Final    Comment:    (NOTE) This assay is not validated for  testing neonatal or myeloproliferative syndrome specimens for Vitamin B12 levels.     Echo: 04/19/15 LV EF: 55% - 60%  ------------------------------------------------------------------- Indications: Syncope 780.2.  ------------------------------------------------------------------- History: PMH: Coronary artery disease. Risk factors: Hypertension.  ------------------------------------------------------------------- Study Conclusions  - Left ventricle: The cavity size was normal. Wall thickness was  normal. Systolic function was normal. The estimated ejection  fraction was in the range of 55% to 60%. Wall motion was normal;  there were no regional wall motion abnormalities. Doppler  parameters are consistent with abnormal left ventricular  relaxation (grade 1 diastolic dysfunction). - Aortic valve: There was trivial regurgitation. - Mitral valve: There was mild regurgitation. - Left atrium: The atrium was mildly dilated. - Pulmonary arteries: Systolic pressure was mildly increased. PA  peak pressure: 31 mm Hg (S).  ECG:  Sinus rhythm  Vent. rate 66 BPM PR interval 130 ms QRS duration 112 ms QT/QTc 421/441 ms P-R-T axes -10 -49 84  Radiology:  No results found.  ASSESSMENT AND PLAN:    1. Recurrent syncope - Sudden onset without prodrome. This is his 3rd admission. Previously he was orthostatic and felt vasovagal. Telemetry without arrhythmia this admission. Unclear etiology. Continue to monitor on telemetry. Consider 30 days event monitor at discharge.  - Echocardiogram done 04/16/2015 shows left ventricular function of 55-60%, no wall motion abnormality, grade 1 diastolic dysfunction, mild mitral regurgitation, mildly dilated left atrium and PA peak pressure of 31 mmHg.  - He dose have intermittent episode of hypotensive and home regimen of imdur , losartan  and toprol-xl  on hold. Given bolus of 1L of fluid in ED. BP is stable since than.  Will review meds with MD. Likely slowly addition of meds at low dose.   2. CAD - s/p 02/2002 stenting of the circumflex using a Cypher drug-eluting stent. At that time he had 50% distal RCA lesion and normal LV function. He also had a 40% right renal artery stenosis. - No anginal pain.   3. Chronic diastolic heart failure - Appears euvolemic. Watch of volume overload with hydration.    Active Problems:   Essential hypertension   Dementia with behavioral disturbance   Protein-calorie malnutrition, severe   Anemia of chronic renal failure, stage 3 (moderate)   Dyslipidemia   Coronary artery disease involving native coronary artery of native heart without angina pectoris   BPH (benign prostatic hyperplasia)   Chronic diastolic congestive heart failure (HCC)   Syncope   Acute kidney injury superimposed on chronic kidney disease (HCC)   Syncopal episodes   UTI (urinary tract infection) due to urinary indwelling catheter (HCC)   Signed: Trinka Keshishyan, PA 04/21/2015, 11:03 AM Pager [phone]  Co-Sign MD

## 2015-04-21 NOTE — Progress Notes (Signed)
PROGRESS NOTE  Jeremy [name] [name]:[id number] DOB: 1925/02/12 DOA: 04/20/2015 PCP: [name], MD Outpatient Specialists:      Brief Narrative: 82 y.o. male with medical history significant for coronary artery disease with stent, dementia, bladder outlet obstruction with indwelling Foley, chronic diastolic CHF, and chronic kidney disease stage II-III who presented to the ED with hypotension and syncopal episode. He was admitted on 4/18.  Assessment & Plan: Active Problems:   Essential hypertension   Dementia with behavioral disturbance   Protein-calorie malnutrition, severe   Anemia of chronic renal failure, stage 3 (moderate)   Dyslipidemia   Coronary artery disease involving native coronary artery of native heart without angina pectoris   BPH (benign prostatic hyperplasia)   Chronic diastolic congestive heart failure (HCC)   Syncope   Acute kidney injury superimposed on chronic kidney disease (HCC)   Syncopal episodes   UTI (urinary tract infection) due to urinary indwelling catheter (HCC)   Recurrent syncopal episode with hypotension - Patient with several admissions just this month alone on 4/12, 4/13, 4/18, felt to be dehydrated/have a UTI, received adequate treatment, however he gets home and shortly thereafter he has recurrent episode. Discussed with the daughter at bedside, patient has regular appetite and appears to have adequate hydration at home - He had a similar admission in January 2016, and was worked up by cardiology and electrophysiology felt that he has a noncardiac etiology  that time - Cortisol was checked at 8 PM and he was found to be normal at 13, ruling against adrenal insufficiency  - His blood pressure has been stable since admission, hold his Cozaar, metoprolol, Imdur - Cardiology consulted, recommending neurology evaluation, consulted neuro as well today  Hypothyroidism - Check TSH  Recent UTI - Continue ceftriaxone, cultures  pending  Hypertension - Hold home antihypertensives, blood pressures within normal limits today  Dementia - Stable  CKD II-III - Cr stable  CAD - Suffered NSTEMI in 2004 with DES placed to Cfx; 50% distal RCA lesion also noted at that time  - No angina or acute EKG findings  - Continue ASA 81, Plavix, Zocor   Chronic diastolic CHF - He appears euvolemic on exam, closely monitor was getting fluids   DVT prophylaxis: Lovenox Code Status: Full code Family Communication: discussed with daughter bedside Disposition Plan: home when ready Barriers for discharge: cards/neuro evaluation  Consultants:   Cardiology   Neurology   Procedures:   None   Antimicrobials:  None    Subjective: - He has no compressive morning, denies any chest pain, denies any palpitations, shortness of breath. He is feeling at baseline.  Objective: Filed Vitals:   04/20/15 2109 04/21/15 0649 04/21/15 0705 04/21/15 0830  BP: 149/91 149/87  126/72  Pulse: 104 84  81  Temp: 97.5 F (36.4 C) 98.4 F (36.9 C)  97.9 F (36.6 C)  TempSrc: Oral Oral  Oral  Resp:  16  18  Weight:   61.7 kg (136 lb 0.4 oz)   SpO2: 98% 100%  100%    Intake/Output Summary (Last 24 hours) at 04/21/15 1342 Last data filed at 04/21/15 1002  Gross per 24 hour  Intake    243 ml  Output   2150 ml  Net  -1907 ml   Filed Weights   04/21/15 0705  Weight: 61.7 kg (136 lb 0.4 oz)    Examination: Constitutional: NAD, calm, comfortable Filed Vitals:   04/20/15 2109 04/21/15 0649 04/21/15 0705 04/21/15 0830  BP: 149/91 149/87  126/72  Pulse: 104 84  81  Temp: 97.5 F (36.4 C) 98.4 F (36.9 C)  97.9 F (36.6 C)  TempSrc: Oral Oral  Oral  Resp:  16  18  Weight:   61.7 kg (136 lb 0.4 oz)   SpO2: 98% 100%  100%   ENMT: Mucous membranes are moist.  Neck: normal, supple, no masses, no thyromegaly Respiratory: clear to auscultation bilaterally, no wheezing, no crackles. Normal respiratory effort. No accessory  muscle use.  Cardiovascular: Regular rate and rhythm, no murmurs / rubs / gallops.   Abdomen: no tenderness Bowel sounds positive.  Musculoskeletal: no clubbing / cyanosis.  Neurologic: non focal  Psychiatric: Underlying dementia   Data Reviewed: I have personally reviewed following labs and imaging studies  CBC:  Recent Labs Lab 04/15/15 1509 04/15/15 2026 04/16/15 0455 04/17/15 0242 04/20/15 1612  WBC 5.7 5.9 6.2 6.1 6.6  NEUTROABS  --   --   --   --  3.7  HGB 10.3* 10.6* 10.7* 10.3* 11.2*  HCT 32.8* 33.3* 33.5* 31.2* 35.2*  MCV 88.9 88.1 88.4 89.1 88.7  PLT 176 188 169 165 160   Basic Metabolic Panel:  Recent Labs Lab 04/15/15 1509 04/15/15 2026 04/16/15 0455 04/17/15 0242 04/20/15 1612 04/21/15 0758  NA 140  --  141 140 137 139  K 3.7  --  3.9 4.0 4.7 3.8  CL 108  --  106 109 106 108  CO2 21*  --  26 21* 20* 19*  GLUCOSE 90  --  80 75 82 78  BUN 15  --  CREATININE 0.95 0.90 1.07 1.07 1.22 0.92  CALCIUM 8.6*  --  8.9 8.6* 9.3 8.7*  MG  --  1.7  --   --   --   --   PHOS  --  2.4*  --   --   --   --    GFR: Estimated Creatinine Clearance: 49.4 mL/min (by C-G formula based on Cr of 0.92). Liver Function Tests:  Recent Labs Lab 04/20/15 1612  AST 29  ALT 11*  ALKPHOS 62  BILITOT 1.0  PROT 7.8  ALBUMIN 3.2*   No results for input(s): LIPASE, AMYLASE in the last 168 hours. No results for input(s): AMMONIA in the last 168 hours. Coagulation Profile: No results for input(s): INR, PROTIME in the last 168 hours. Cardiac Enzymes:  Recent Labs Lab 04/18/15 2311 04/19/15 0530 04/20/15 2055 04/21/15 0247 04/21/15 0758  TROPONINI <0.03 <0.03 <0.03 <0.03 <0.03   BNP (last 3 results) No results for input(s): PROBNP in the last 8760 hours. HbA1C: No results for input(s): HGBA1C in the last 72 hours. CBG:  Recent Labs Lab 04/18/15 0602 04/19/15 0534 04/20/15 1614 04/21/15 0657 04/21/15 1128  GLUCAP 79 70 79 82 109*   Lipid  Profile: No results for input(s): CHOL, HDL, LDLCALC, TRIG, CHOLHDL, LDLDIRECT in the last 72 hours. Thyroid Function Tests: No results for input(s): TSH, T4TOTAL, FREET4, T3FREE, THYROIDAB in the last 72 hours. Anemia Panel: No results for input(s): VITAMINB12, FOLATE, FERRITIN, TIBC, IRON, RETICCTPCT in the last 72 hours. Urine analysis:    Component Value Date/Time   COLORURINE YELLOW 04/20/2015 1720   APPEARANCEUR CLOUDY* 04/20/2015 1720   LABSPEC 1.014 04/20/2015 1720   PHURINE 5.5 04/20/2015 1720   GLUCOSEU NEGATIVE 04/20/2015 1720   HGBUR NEGATIVE 04/20/2015 1720   BILIRUBINUR NEGATIVE 04/20/2015 1720   KETONESUR NEGATIVE 04/20/2015 1720   PROTEINUR NEGATIVE 04/20/2015 1720  UROBILINOGEN 0.2 11/08/2014 0334   NITRITE NEGATIVE 04/20/2015 1720   LEUKOCYTESUR LARGE* 04/20/2015 1720   Sepsis Labs: Invalid input(s): PROCALCITONIN, LACTICIDVEN  Recent Results (from the past 240 hour(s))  Blood Culture (routine x 2)     Status: None   Collection Time: 04/13/15 11:13 PM  Result Value Ref Range Status   Specimen Description BLOOD LEFT ARM  Final   Special Requests IN PEDIATRIC BOTTLE  Final   Culture NO GROWTH 5 DAYS  Final   Report Status 04/18/2015 FINAL  Final  Blood Culture (routine x 2)     Status: None   Collection Time: 04/13/15 11:18 PM  Result Value Ref Range Status   Specimen Description BLOOD LEFT ARM  Final   Special Requests IN PEDIATRIC BOTTLE  Final   Culture NO GROWTH 5 DAYS  Final   Report Status 04/18/2015 FINAL  Final  Urine culture     Status: Abnormal   Collection Time: 04/13/15 11:52 PM  Result Value Ref Range Status   Specimen Description URINE, RANDOM  Final   Special Requests NONE  Final   Culture (A)  Final    >=100,000 COLONIES/mL PSEUDOMONAS AERUGINOSA 50,000 COLONIES/mL ENTEROCOCCUS SPECIES    Report Status 04/18/2015 FINAL  Final   Organism ID, Bacteria PSEUDOMONAS AERUGINOSA (A)  Final   Organism ID, Bacteria ENTEROCOCCUS  SPECIES (A)  Final      Susceptibility   Pseudomonas aeruginosa - MIC*    CEFTAZIDIME 4 SENSITIVE Sensitive     CIPROFLOXACIN <=0.25 SENSITIVE Sensitive     GENTAMICIN 2 SENSITIVE Sensitive     IMIPENEM 2 SENSITIVE Sensitive     PIP/TAZO 8 SENSITIVE Sensitive     CEFEPIME 4 SENSITIVE Sensitive     * >=100,000 COLONIES/mL PSEUDOMONAS AERUGINOSA   Enterococcus species - MIC*    AMPICILLIN <=2 SENSITIVE Sensitive     LEVOFLOXACIN >=8 RESISTANT Resistant     NITROFURANTOIN <=16 SENSITIVE Sensitive     VANCOMYCIN 1 SENSITIVE Sensitive     * 50,000 COLONIES/mL ENTEROCOCCUS SPECIES  MRSA PCR Screening     Status: Abnormal   Collection Time: 04/14/15  2:54 AM  Result Value Ref Range Status   MRSA by PCR POSITIVE (A) NEGATIVE Final    Comment:        The GeneXpert MRSA Assay (FDA approved for NASAL specimens only), is one component of a comprehensive MRSA colonization surveillance program. It is not intended to diagnose MRSA infection nor to guide or monitor treatment for MRSA infections. RESULT CALLED TO, READ BACK BY AND VERIFIED WITH: M RUDISILL,RN  04/14/15 MKELLY   Urine culture     Status: None (Preliminary result)   Collection Time: 04/20/15  5:20 PM  Result Value Ref Range Status   Specimen Description URINE, CATHETERIZED  Final   Special Requests NONE  Final   Culture NO GROWTH < 24 HOURS  Final   Report Status PENDING  Incomplete  MRSA PCR Screening     Status: Abnormal   Collection Time: 04/20/15 10:33 PM  Result Value Ref Range Status   MRSA by PCR POSITIVE (A) NEGATIVE Final    Comment:        The GeneXpert MRSA Assay (FDA approved for NASAL specimens only), is one component of a comprehensive MRSA colonization surveillance program. It is not intended to diagnose MRSA infection nor to guide or monitor treatment for MRSA infections. RESULT CALLED TO, READ BACK BY AND VERIFIED WITH: B JOHNSON,RN  04/21/15 MKELLY  Radiology Studies: No  results found.   Scheduled Meds: . aspirin EC  81 mg Oral Daily  . cefTRIAXone (ROCEPHIN)  IV  1 g Intravenous Q24H  . clopidogrel  75 mg Oral Daily  . dorzolamide  1 drop Right Eye BID  . enoxaparin (LOVENOX) injection  40 mg Subcutaneous Q24H  . famotidine  20 mg Oral Daily  . finasteride  5 mg Oral QPM  . latanoprost  1 drop Both Eyes QHS  . levothyroxine  75 mcg Oral QAC breakfast  . QUEtiapine  25 mg Oral QHS  . simvastatin  40 mg Oral q1800  . sodium chloride flush  3 mL Intravenous Q12H  . timolol  1 drop Both Eyes BID  . [START ON 04/22/2015] Vitamin D (Ergocalciferol)  50,000 Units Oral Once per day on Mon Thu   Continuous Infusions:    Pamella Pertostin Gherghe, MD, PhD [facility] Pager [phone]  If 7PM-7AM, please contact night-coverage [email or web address] Password [email or web address] 04/21/2015, 1:42 PM

## 2015-04-21 NOTE — Evaluation (Signed)
Physical Therapy Evaluation Patient Details Name: Jeremy [name] MRN: [id number] DOB: 1925-03-05 Today's Date: 04/21/2015   History of Present Illness  Jeremy [name] is a 82 y.o. male with medical history significant for coronary artery disease with stent, dementia, bladder outlet obstruction with indwelling Foley, chronic diastolic CHF, and chronic kidney disease stage II-III who presents to the ED with hypotension and syncopal episode.  Recent admissions for syncope 04/13/15-04/14/15 and 04/15/15-04/19/15.  Clinical Impression  Patient presents with decreased independence with mobility due to deficits listed in PT problem list below.  He will benefit from skilled PT in the acute setting to allow d/c home with family support.    Follow Up Recommendations Home health PT;Supervision/Assistance - 24 hour    Equipment Recommendations  None recommended by PT    Recommendations for Other Services       Precautions / Restrictions Precautions Precautions: Fall Precaution Comments: watch BP      Mobility  Bed Mobility Overal bed mobility: Needs Assistance Bed Mobility: Supine to Sit     Supine to sit: Min guard     General bed mobility comments: use of rail and cues for technique  Transfers Overall transfer level: Needs assistance Equipment used: Rolling walker (2 wheeled)   Sit to Stand: Min assist;Mod assist;+2 safety/equipment         General transfer comment: initial posterior bias in standing required assist for safety to prevent LOB and cues needed for safety with hand placement  Ambulation/Gait Ambulation/Gait assistance: Min assist Ambulation Distance (Feet): 30 Feet (and 15') Assistive device: Rolling walker (2 wheeled) Gait Pattern/deviations: Step-through pattern;Trunk flexed;Decreased stride length     General Gait Details: cues and assist for maneuvering in room due to visual deficits  Stairs            Wheelchair Mobility    Modified Rankin (Stroke  Patients Only)       Balance Overall balance assessment: Needs assistance   Sitting balance-Leahy Scale: Fair     Standing balance support: Bilateral upper extremity supported Standing balance-Leahy Scale: Poor Standing balance comment: assist for balance due to posterior bias with decreased vision and poor sensation due to stenosis                             Pertinent Vitals/Pain Pain Assessment: No/denies pain  Orthostatic VS for the past 24 hrs:  BP- Lying Pulse- Lying BP- Sitting Pulse- Sitting BP- Standing at 0 minutes Pulse- Standing at 0 minutes  04/21/15 1509 101/65 mmHg 77 121/71 mmHg 81 111/72 mmHg 90  04/21/15 0830 126/72 mmHg 81 148/85 mmHg 87 - -   BP standing 3 minutes 98/62; pulse standing at 3 minutes 92     Home Living Family/patient expects to be discharged to:: Private residence Living Arrangements: Spouse/significant other Available Help at Discharge: Available 24 hours/day;Family Type of Home: House Home Access: Stairs to enter;Ramped entrance (have a temporary ramp they use for pt) Entrance Stairs-Rails: Left Entrance Stairs-Number of Steps: 3 Home Layout: Multi-level Home Equipment: Walker - 2 wheels;Cane - single point;Shower seat;Bedside commode;Wheelchair - manual Additional Comments: decreased vision    Prior Function Level of Independence: Needs assistance   Gait / Transfers Assistance Needed: assist with transfers/mobility; limited ambulation due to spinal stenosis, uses w/c when going out  ADL's / Homemaking Assistance Needed: assist with dressing, bathing, feeding, shower transfer  Comments: Pt lives with wife who has dementia and cannot assist him. Per  pt's grandson pt and pt's wife has someone at there house 24 hours, usually daughters coming morning and night     Hand Dominance   Dominant Hand: Right    Extremity/Trunk Assessment   Upper Extremity Assessment: Generalized weakness           Lower Extremity  Assessment: Generalized weakness         Communication   Communication: No difficulties  Cognition Arousal/Alertness: Awake/alert Behavior During Therapy: WFL for tasks assessed/performed Overall Cognitive Status: History of cognitive impairments - at baseline                      General Comments      Exercises        Assessment/Plan    PT Assessment Patient needs continued PT services  PT Diagnosis Difficulty walking;Generalized weakness   PT Problem List Decreased strength;Decreased knowledge of use of DME;Decreased safety awareness;Decreased activity tolerance;Decreased balance;Decreased mobility;Impaired sensation;Cardiopulmonary status limiting activity  PT Treatment Interventions DME instruction;Gait training;Patient/family education;Stair training;Therapeutic activities;Therapeutic exercise;Balance training;Functional mobility training   PT Goals (Current goals can be found in the Care Plan section) Acute Rehab PT Goals Patient Stated Goal: go home PT Goal Formulation: With patient/family Time For Goal Achievement: 05/05/15 Potential to Achieve Goals: Good    Frequency Min 3X/week   Barriers to discharge        Co-evaluation               End of Session Equipment Utilized During Treatment: Gait belt Activity Tolerance: Patient tolerated treatment well Patient left: in chair;with call bell/phone within reach;with family/visitor present      Functional Assessment Tool Used: Clinical Judgement Functional Limitation: Mobility: Walking and moving around Mobility: Walking and Moving Around Current Status [phone](G8978): At least 40 percent but less than 60 percent impaired, limited or restricted Mobility: Walking and Moving Around Goal Status [phone](G8979): At least 20 percent but less than 40 percent impaired, limited or restricted    Time: [id number] PT Time Calculation (min) (ACUTE ONLY): 24 min   Charges:   PT Evaluation $PT Eval Moderate Complexity: 1  Procedure PT Treatments $Gait Training: 8-22 mins   PT G Codes:   PT G-Codes **NOT FOR INPATIENT CLASS** Functional Assessment Tool Used: Clinical Judgement Functional Limitation: Mobility: Walking and moving around Mobility: Walking and Moving Around Current Status ([id number]([id number]): At least 40 percent but less than 60 percent impaired, limited or restricted Mobility: Walking and Moving Around Goal Status [phone](G8979): At least 20 percent but less than 40 percent impaired, limited or restricted    Elray McgregorCynthia Smera [place] 04/21/2015, 5:27 PM  Sheran Lawlessyndi Lariya [place], [place]PT [phone] 04/21/2015

## 2015-04-22 ENCOUNTER — Observation Stay (HOSPITAL_COMMUNITY): Payer: Medicare Other

## 2015-04-22 ENCOUNTER — Encounter (HOSPITAL_COMMUNITY): Payer: Self-pay | Admitting: Radiology

## 2015-04-22 DIAGNOSIS — I5032 Chronic diastolic (congestive) heart failure: Secondary | ICD-10-CM | POA: Diagnosis not present

## 2015-04-22 DIAGNOSIS — N183 Chronic kidney disease, stage 3 (moderate): Secondary | ICD-10-CM | POA: Diagnosis not present

## 2015-04-22 DIAGNOSIS — N400 Enlarged prostate without lower urinary tract symptoms: Secondary | ICD-10-CM | POA: Diagnosis not present

## 2015-04-22 DIAGNOSIS — E785 Hyperlipidemia, unspecified: Secondary | ICD-10-CM | POA: Diagnosis not present

## 2015-04-22 DIAGNOSIS — R55 Syncope and collapse: Secondary | ICD-10-CM | POA: Diagnosis not present

## 2015-04-22 LAB — GLUCOSE, CAPILLARY
GLUCOSE-CAPILLARY: 96 mg/dL (ref 65–99)
Glucose-Capillary: 77 mg/dL (ref 65–99)
Glucose-Capillary: 94 mg/dL (ref 65–99)

## 2015-04-22 LAB — COMPREHENSIVE METABOLIC PANEL
ALT: 10 U/L — AB (ref 17–63)
AST: 16 U/L (ref 15–41)
Albumin: 2.7 g/dL — ABNORMAL LOW (ref 3.5–5.0)
Alkaline Phosphatase: 52 U/L (ref 38–126)
Anion gap: 9 (ref 5–15)
BILIRUBIN TOTAL: 0.5 mg/dL (ref 0.3–1.2)
BUN: 13 mg/dL (ref 6–20)
CALCIUM: 8.3 mg/dL — AB (ref 8.9–10.3)
CO2: 21 mmol/L — ABNORMAL LOW (ref 22–32)
CREATININE: 0.84 mg/dL (ref 0.61–1.24)
Chloride: 110 mmol/L (ref 101–111)
Glucose, Bld: 81 mg/dL (ref 65–99)
Potassium: 3.7 mmol/L (ref 3.5–5.1)
Sodium: 140 mmol/L (ref 135–145)
TOTAL PROTEIN: 6.4 g/dL — AB (ref 6.5–8.1)

## 2015-04-22 LAB — CBC
HCT: 31 % — ABNORMAL LOW (ref 39.0–52.0)
Hemoglobin: 10 g/dL — ABNORMAL LOW (ref 13.0–17.0)
MCH: 28.4 pg (ref 26.0–34.0)
MCHC: 32.3 g/dL (ref 30.0–36.0)
MCV: 88.1 fL (ref 78.0–100.0)
PLATELETS: 168 10*3/uL (ref 150–400)
RBC: 3.52 MIL/uL — AB (ref 4.22–5.81)
RDW: 16.8 % — AB (ref 11.5–15.5)
WBC: 5.3 10*3/uL (ref 4.0–10.5)

## 2015-04-22 LAB — PROCALCITONIN

## 2015-04-22 MED ORDER — FLUDROCORTISONE ACETATE 0.1 MG PO TABS
0.0500 mg | ORAL_TABLET | Freq: Every day | ORAL | Status: DC
  Administered 2015-04-22 – 2015-04-23 (×2): 0.05 mg via ORAL
  Filled 2015-04-22 (×2): qty 0.5

## 2015-04-22 MED ORDER — IOPAMIDOL (ISOVUE-370) INJECTION 76%
INTRAVENOUS | Status: AC
  Administered 2015-04-22: 50 mL
  Filled 2015-04-22: qty 50

## 2015-04-22 MED ORDER — IOPAMIDOL (ISOVUE-370) INJECTION 76%
INTRAVENOUS | Status: AC
  Filled 2015-04-22: qty 50

## 2015-04-22 NOTE — Progress Notes (Addendum)
PROGRESS NOTE  Jeremy [name]:[id number] DOB: Mar 22, 1925 DOA: 04/20/2015 PCP: [name], MD Outpatient Specialists:    Brief Narrative: 82 y.o. male with medical history significant for coronary artery disease with stent, dementia, bladder outlet obstruction with indwelling Foley, chronic diastolic CHF, and chronic kidney disease stage II-III who presented to the ED with hypotension and syncopal episode. He was admitted on 4/18.  Assessment & Plan: Active Problems:   Essential hypertension   Dementia with behavioral disturbance   Protein-calorie malnutrition, severe   Anemia of chronic renal failure, stage 3 (moderate)   Dyslipidemia   Coronary artery disease involving native coronary artery of native heart without angina pectoris   BPH (benign prostatic hyperplasia)   Chronic diastolic congestive heart failure (HCC)   Syncope   Acute kidney injury superimposed on chronic kidney disease (HCC)   Syncopal episodes   UTI (urinary tract infection) due to urinary indwelling catheter (HCC)   Recurrent syncopal episode with hypotension - Patient with several admissions just this month alone on 4/12, 4/13, 4/18, felt to be dehydrated/have a UTI, received adequate treatment, however he gets home and shortly thereafter he has recurrent episode. Discussed with the daughter at bedside, patient has regular appetite and appears to have adequate hydration at home - He had a similar admission in January 2016, and was worked up by cardiology and electrophysiology felt that he has a noncardiac etiology that time - Cortisol was checked at 8 PM and he was found to be normal at 13, ruling against adrenal insufficiency  - His blood pressure has been stable since admission, hold his Cozaar, metoprolol, Imdur - cardiology and neurology consulted, appreciate input - discussed with Dr. Lavon Paganini, start florinef. Obtain CT angio neck  Hypothyroidism - TSH normal  Recent UTI - Continue ceftriaxone,  cultures pending  Hypertension - Hold home antihypertensives, blood pressures remain within normal limits, 113/68 this morning  Dementia - Stable  CKD II-III - Cr stable, 0.84 this morning  CAD - Suffered NSTEMI in 2004 with DES placed to Cfx; 50% distal RCA lesion also noted at that time  - No angina or acute EKG findings  - Continue ASA 81, Plavix, Zocor   Chronic diastolic CHF - He appears euvolemic on exam   DVT prophylaxis: Lovenox Code Status: Full code Family Communication: discussed with daughter bedside Disposition Plan: home when ready Barriers for discharge: cards/neuro evaluation  Consultants:   Cardiology   Neurology   Procedures:   None   Antimicrobials:  None    Subjective: - no chest pain / dyspnea, no lightheadedness or dizziness  Objective: Filed Vitals:   04/21/15 2034 04/22/15 0531 04/22/15 0534 04/22/15 0852  BP: 133/76   113/68  Pulse: 81 79  89  Temp: 98.2 F (36.8 C) 98.4 F (36.9 C)    TempSrc: Oral Oral    Resp: 20 18    Weight:   61.1 kg (134 lb 11.2 oz)   SpO2: 100% 100%      Intake/Output Summary (Last 24 hours) at 04/22/15 1251 Last data filed at 04/22/15 0900  Gross per 24 hour  Intake    720 ml  Output   1700 ml  Net   -980 ml   Filed Weights   04/21/15 0705 04/22/15 0534  Weight: 61.7 kg (136 lb 0.4 oz) 61.1 kg (134 lb 11.2 oz)    Examination: Constitutional: NAD Filed Vitals:   04/21/15 2034 04/22/15 0531 04/22/15 0534 04/22/15 0852  BP: 133/76   113/68  Pulse: 81 79  89  Temp: 98.2 F (36.8 C) 98.4 F (36.9 C)    TempSrc: Oral Oral    Resp: 20 18    Weight:   61.1 kg (134 lb 11.2 oz)   SpO2: 100% 100%     Respiratory: clear to auscultation bilaterally, no wheezing, no crackles. Normal respiratory effort. No accessory muscle use.  Cardiovascular: Regular rate and rhythm, no murmurs / rubs / gallops.   Abdomen: no tenderness Bowel sounds positive.  Neurologic: non focal  Psychiatric: Underlying  dementia   Data Reviewed: I have personally reviewed following labs and imaging studies  CBC:  Recent Labs Lab 04/15/15 2026 04/16/15 0455 04/17/15 0242 04/20/15 1612 04/22/15 0548  WBC 5.9 6.2 6.1 6.6 5.3  NEUTROABS  --   --   --  3.7  --   HGB 10.6* 10.7* 10.3* 11.2* 10.0*  HCT 33.3* 33.5* 31.2* 35.2* 31.0*  MCV 88.1 88.4 89.1 88.7 88.1  PLT 188 169 165 160 168   Basic Metabolic Panel:  Recent Labs Lab 04/15/15 2026 04/16/15 0455 04/17/15 0242 04/20/15 1612 04/21/15 0758 04/22/15 0548  NA  --  141 140 137 139 140  K  --  3.9 4.0 4.7 3.8 3.7  CL  --  106 109 106 108 110  CO2  --  26 21* 20* 19* 21*  GLUCOSE  --  80 75 82 78 81  BUN  --  CREATININE 0.90 1.07 1.07 1.22 0.92 0.84  CALCIUM  --  8.9 8.6* 9.3 8.7* 8.3*  MG 1.7  --   --   --   --   --   PHOS 2.4*  --   --   --   --   --    GFR: Estimated Creatinine Clearance: 53.5 mL/min (by C-G formula based on Cr of 0.84). Liver Function Tests:  Recent Labs Lab 04/20/15 1612 04/22/15 0548  AST 29 16  ALT 11* 10*  ALKPHOS 62 52  BILITOT 1.0 0.5  PROT 7.8 6.4*  ALBUMIN 3.2* 2.7*   No results for input(s): LIPASE, AMYLASE in the last 168 hours. No results for input(s): AMMONIA in the last 168 hours. Coagulation Profile: No results for input(s): INR, PROTIME in the last 168 hours. Cardiac Enzymes:  Recent Labs Lab 04/18/15 2311 04/19/15 0530 04/20/15 2055 04/21/15 0247 04/21/15 0758  TROPONINI <0.03 <0.03 <0.03 <0.03 <0.03   BNP (last 3 results) No results for input(s): PROBNP in the last 8760 hours. HbA1C: No results for input(s): HGBA1C in the last 72 hours. CBG:  Recent Labs Lab 04/21/15 0657 04/21/15 1128 04/21/15 1657 04/21/15 2053 04/22/15 0709  GLUCAP 82 109* 83 97 77   Lipid Profile: No results for input(s): CHOL, HDL, LDLCALC, TRIG, CHOLHDL, LDLDIRECT in the last 72 hours. Thyroid Function Tests:  Recent Labs  04/21/15 1239  TSH 2.759   Anemia Panel: No  results for input(s): VITAMINB12, FOLATE, FERRITIN, TIBC, IRON, RETICCTPCT in the last 72 hours. Urine analysis:    Component Value Date/Time   COLORURINE YELLOW 04/20/2015 1720   APPEARANCEUR CLOUDY* 04/20/2015 1720   LABSPEC 1.014 04/20/2015 1720   PHURINE 5.5 04/20/2015 1720   GLUCOSEU NEGATIVE 04/20/2015 1720   HGBUR NEGATIVE 04/20/2015 1720   BILIRUBINUR NEGATIVE 04/20/2015 1720   KETONESUR NEGATIVE 04/20/2015 1720   PROTEINUR NEGATIVE 04/20/2015 1720   UROBILINOGEN 0.2 11/08/2014 0334   NITRITE NEGATIVE 04/20/2015 1720   LEUKOCYTESUR LARGE* 04/20/2015 1720   Sepsis  Labs: Invalid input(s): PROCALCITONIN, LACTICIDVEN  Recent Results (from the past 240 hour(s))  Blood Culture (routine x 2)     Status: None   Collection Time: 04/13/15 11:13 PM  Result Value Ref Range Status   Specimen Description BLOOD LEFT ARM  Final   Special Requests IN PEDIATRIC BOTTLE  Final   Culture NO GROWTH 5 DAYS  Final   Report Status 04/18/2015 FINAL  Final  Blood Culture (routine x 2)     Status: None   Collection Time: 04/13/15 11:18 PM  Result Value Ref Range Status   Specimen Description BLOOD LEFT ARM  Final   Special Requests IN PEDIATRIC BOTTLE  Final   Culture NO GROWTH 5 DAYS  Final   Report Status 04/18/2015 FINAL  Final  Urine culture     Status: Abnormal   Collection Time: 04/13/15 11:52 PM  Result Value Ref Range Status   Specimen Description URINE, RANDOM  Final   Special Requests NONE  Final   Culture (A)  Final    >=100,000 COLONIES/mL PSEUDOMONAS AERUGINOSA 50,000 COLONIES/mL ENTEROCOCCUS SPECIES    Report Status 04/18/2015 FINAL  Final   Organism ID, Bacteria PSEUDOMONAS AERUGINOSA (A)  Final   Organism ID, Bacteria ENTEROCOCCUS SPECIES (A)  Final      Susceptibility   Pseudomonas aeruginosa - MIC*    CEFTAZIDIME 4 SENSITIVE Sensitive     CIPROFLOXACIN <=0.25 SENSITIVE Sensitive     GENTAMICIN 2 SENSITIVE Sensitive     IMIPENEM 2 SENSITIVE Sensitive      PIP/TAZO 8 SENSITIVE Sensitive     CEFEPIME 4 SENSITIVE Sensitive     * >=100,000 COLONIES/mL PSEUDOMONAS AERUGINOSA   Enterococcus species - MIC*    AMPICILLIN <=2 SENSITIVE Sensitive     LEVOFLOXACIN >=8 RESISTANT Resistant     NITROFURANTOIN <=16 SENSITIVE Sensitive     VANCOMYCIN 1 SENSITIVE Sensitive     * 50,000 COLONIES/mL ENTEROCOCCUS SPECIES  MRSA PCR Screening     Status: Abnormal   Collection Time: 04/14/15  2:54 AM  Result Value Ref Range Status   MRSA by PCR POSITIVE (A) NEGATIVE Final    Comment:        The GeneXpert MRSA Assay (FDA approved for NASAL specimens only), is one component of a comprehensive MRSA colonization surveillance program. It is not intended to diagnose MRSA infection nor to guide or monitor treatment for MRSA infections. RESULT CALLED TO, READ BACK BY AND VERIFIED WITH: M RUDISILL,RN @0623  04/14/15 MKELLY   Urine culture     Status: None   Collection Time: 04/20/15  5:20 PM  Result Value Ref Range Status   Specimen Description URINE, CATHETERIZED  Final   Special Requests NONE  Final   Culture NO GROWTH 1 DAY  Final   Report Status 04/21/2015 FINAL  Final  MRSA PCR Screening     Status: Abnormal   Collection Time: 04/20/15 10:33 PM  Result Value Ref Range Status   MRSA by PCR POSITIVE (A) NEGATIVE Final    Comment:        The GeneXpert MRSA Assay (FDA approved for NASAL specimens only), is one component of a comprehensive MRSA colonization surveillance program. It is not intended to diagnose MRSA infection nor to guide or monitor treatment for MRSA infections. RESULT CALLED TO, READ BACK BY AND VERIFIED WITH: B JOHNSON,RN @0124  04/21/15 [facility]       Radiology Studies: No results found.   Scheduled Meds: . aspirin EC  81 mg Oral  Daily  . cefTRIAXone (ROCEPHIN)  IV  1 g Intravenous Q24H  . clopidogrel  75 mg Oral Daily  . dorzolamide  1 drop Right Eye BID  . enoxaparin (LOVENOX) injection  40 mg Subcutaneous Q24H  .  famotidine  20 mg Oral Daily  . finasteride  5 mg Oral QPM  . fludrocortisone  0.05 mg Oral Daily  . iopamidol      . latanoprost  1 drop Both Eyes QHS  . levothyroxine  75 mcg Oral QAC breakfast  . QUEtiapine  25 mg Oral QHS  . simvastatin  40 mg Oral q1800  . sodium chloride flush  3 mL Intravenous Q12H  . timolol  1 drop Both Eyes BID  . Vitamin D (Ergocalciferol)  50,000 Units Oral Once per day on Mon Thu   Continuous Infusions:    Pamella Pertostin Aynslee Mulhall, MD, PhD [facility] Pager [phone]  If 7PM-7AM, please contact night-coverage [email or web address] Password [email or web address] 04/22/2015, 12:51 PM

## 2015-04-22 NOTE — Progress Notes (Signed)
PATIENT ID: [id number] with CAD, hypertension, hyperlipidemia, CKD, and chronic diastolic heart failure here with recurrent syncope.   SUBJECTIVE:  Feels well.  Wants to go home.  Denies chest pain, shortness of breath, palpitations, lightheadedness or dizziness.     PHYSICAL EXAM Filed Vitals:   04/21/15 2034 04/22/15 0531 04/22/15 0534 04/22/15 0852  BP: 133/76   113/68  Pulse: 81 79  89  Temp: 98.2 F (36.8 C) 98.4 F (36.9 C)    TempSrc: Oral Oral    Resp: 20 18    Weight:   61.1 kg (134 lb 11.2 oz)   SpO2: 100% 100%     General:  Well-appearing, frail, elderly man in NAD.  Neck: No JVD.  Prominent EJ.  Lungs:  CTAB.  No crackles, rhonchi or wheezes. Heart:  RRR.  No m/r/g.  Normal S1/S2 Abdomen:  Soft, NT, ND. +BS Extremities:  WWP.  No edema.   LABS: Lab Results  Component Value Date   TROPONINI <0.03 04/21/2015   Results for orders placed or performed during the hospital encounter of 04/20/15 (from the past 24 hour(s))  TSH     Status: None   Collection Time: 04/21/15 12:39 PM  Result Value Ref Range   TSH 2.759 0.350 - 4.500 uIU/mL  Glucose, capillary     Status: None   Collection Time: 04/21/15  4:57 PM  Result Value Ref Range   Glucose-Capillary 83 65 - 99 mg/dL   Comment 1 Notify RN    Comment 2 Document in Chart   Glucose, capillary     Status: None   Collection Time: 04/21/15  8:53 PM  Result Value Ref Range   Glucose-Capillary 97 65 - 99 mg/dL   Comment 1 Notify RN    Comment 2 Document in Chart   Procalcitonin     Status: None   Collection Time: 04/22/15  5:48 AM  Result Value Ref Range   Procalcitonin <0.10 ng/mL  Comprehensive metabolic panel     Status: Abnormal   Collection Time: 04/22/15  5:48 AM  Result Value Ref Range   Sodium 140 135 - 145 mmol/L   Potassium 3.7 3.5 - 5.1 mmol/L   Chloride 110 101 - 111 mmol/L   CO2 21 (L) 22 - 32 mmol/L   Glucose, Bld 81 65 - 99 mg/dL   BUN 13 6 - 20 mg/dL   Creatinine, Ser 1.61 0.61 - 1.24 mg/dL   Calcium 8.3 (L) 8.9 - 10.3 mg/dL   Total Protein 6.4 (L) 6.5 - 8.1 g/dL   Albumin 2.7 (L) 3.5 - 5.0 g/dL   AST 16 15 - 41 U/L   ALT 10 (L) 17 - 63 U/L   Alkaline Phosphatase 52 38 - 126 U/L   Total Bilirubin 0.5 0.3 - 1.2 mg/dL   GFR calc non Af Amer >60 >60 mL/min   GFR calc Af Amer >60 >60 mL/min   Anion gap 9 5 - 15  CBC     Status: Abnormal   Collection Time: 04/22/15  5:48 AM  Result Value Ref Range   WBC 5.3 4.0 - 10.5 K/uL   RBC 3.52 (L) 4.22 - 5.81 MIL/uL   Hemoglobin 10.0 (L) 13.0 - 17.0 g/dL   HCT 09.6 (L) 04.5 - 40.9 %   MCV 88.1 78.0 - 100.0 fL   MCH 28.4 26.0 - 34.0 pg   MCHC 32.3 30.0 - 36.0 g/dL   RDW 81.1 (H) 91.4 - 78.2 %   Platelets  168 150 - 400 K/uL  Glucose, capillary     Status: None   Collection Time: 04/22/15  7:09 AM  Result Value Ref Range   Glucose-Capillary 77 65 - 99 mg/dL   Comment 1 Notify RN    Comment 2 Document in Chart     Intake/Output Summary (Last 24 hours) at 04/22/15 1212 Last data filed at 04/22/15 0900  Gross per 24 hour  Intake    720 ml  Output   1700 ml  Net   -980 ml    Telemetry: Sinus rhythm.  Multiform PVCs.    Echo 04/16/15: Study Conclusions  - Left ventricle: The cavity size was normal. Wall thickness was  normal. Systolic function was normal. The estimated ejection  fraction was in the range of 55% to 60%. Wall motion was normal;  there were no regional wall motion abnormalities. Doppler  parameters are consistent with abnormal left ventricular  relaxation (grade 1 diastolic dysfunction). - Aortic valve: There was trivial regurgitation. - Mitral valve: There was mild regurgitation. - Left atrium: The atrium was mildly dilated. - Pulmonary arteries: Systolic pressure was mildly increased. PA  peak pressure: 31 mm Hg (S).  ASSESSMENT AND PLAN:  Active Problems:   Essential hypertension   Dementia with behavioral disturbance   Protein-calorie malnutrition, severe   Anemia of chronic renal failure,  stage 3 (moderate)   Dyslipidemia   Coronary artery disease involving native coronary artery of native heart without angina pectoris   BPH (benign prostatic hyperplasia)   Chronic diastolic congestive heart failure (HCC)   Syncope   Acute kidney injury superimposed on chronic kidney disease (HCC)   Syncopal episodes   UTI (urinary tract infection) due to urinary indwelling catheter (HCC)   # Hypertension:  BP has remained stable off all antihypertensives.  Will continue to hold for now.    # Recurrent syncope: Likely overmedication vs. autonomic dysfunction.  Telemetry shows occasional PVCs.  He reports adequate PO intake but only 480mL recorded intake yesterday with -2.2L fluid balance.  Neurology is seeing him as well and has ordered CT head/CT-A head and neck.   Normal cortisol level last night.  # Hyperlipidemia: Continue simvastatin.    Jeremy [name] C. Duke Salviaandolph, MD, [facility] HospitalFACC 04/22/2015 12:12 PM

## 2015-04-23 DIAGNOSIS — F0391 Unspecified dementia with behavioral disturbance: Secondary | ICD-10-CM | POA: Diagnosis not present

## 2015-04-23 DIAGNOSIS — N179 Acute kidney failure, unspecified: Secondary | ICD-10-CM | POA: Diagnosis not present

## 2015-04-23 DIAGNOSIS — R55 Syncope and collapse: Secondary | ICD-10-CM | POA: Diagnosis not present

## 2015-04-23 DIAGNOSIS — N189 Chronic kidney disease, unspecified: Secondary | ICD-10-CM

## 2015-04-23 DIAGNOSIS — I5032 Chronic diastolic (congestive) heart failure: Secondary | ICD-10-CM | POA: Diagnosis not present

## 2015-04-23 DIAGNOSIS — E43 Unspecified severe protein-calorie malnutrition: Secondary | ICD-10-CM

## 2015-04-23 LAB — GLUCOSE, CAPILLARY
Glucose-Capillary: 70 mg/dL (ref 65–99)
Glucose-Capillary: 121 mg/dL — ABNORMAL HIGH (ref 65–99)

## 2015-04-23 MED ORDER — FLUDROCORTISONE ACETATE 0.1 MG PO TABS: 0.0500 mg | ORAL_TABLET | Freq: Every day | ORAL | Status: DC

## 2015-04-23 NOTE — Care Management Note (Signed)
Case Management Note Donn PieriniKristi Avant Printy RN, BSN Unit 2W-Case Manager [phone]  Patient Details  Name: Jeremy CoxJames T [name] MRN: [id number] Date of Birth: 1925/10/09  Subjective/Objective:     Pt placed in OBS for syncope               Action/Plan: PTA pt lived  At home with spouse- [facility]- [facility]- [facility] and service resumption  Expected Discharge Date:    04/23/15              Expected Discharge Plan:  Home w Home Health Services  In-House Referral:     Discharge planning Services  CM Consult  Post Acute Care Choice:  Home Health, Resumption of Svcs/PTA Provider Choice offered to:  Patient, Spouse  DME Arranged:    DME Agency:     HH Arranged:  RN, PT, Nurse's Aide [facility]  Status of Service:  Completed, signed off  Medicare Important Message Given:    Date Medicare IM Given:    Medicare IM give by:    Date Additional Medicare IM Given:    Additional Medicare Important Message give by:     If discussed at Long Length of Stay Meetings, dates discussed:    Additional Comments:  Darrold SpanWebster, Rondrick Barreira Hall, RN 04/23/2015, 11:38 AM

## 2015-04-23 NOTE — Progress Notes (Signed)
Pt. Discharged to home with daughters Pt. D/C'd via wheelchair with voulenteers Discharge information reviewed and given All personal belongings given to Pt.  Education discussed with daughters IV was d/c Tele d/c

## 2015-04-23 NOTE — Discharge Summary (Signed)
Physician Discharge Summary  Jeremy CoxJames T Mayweather WUJ:[id number] DOB: 03/17/1925 DOA: 04/20/2015  PCP: Gwynneth AlimentSANDERS,ROBYN N, MD  Admit date: 04/20/2015 Discharge date: 04/23/2015  Time spent: > 30 minutes  Recommendations for Outpatient Follow-up:  1. Follow up with cardiology as scheduled   Discharge Diagnoses:  Active Problems:   Essential hypertension   Dementia with behavioral disturbance   Protein-calorie malnutrition, severe   Anemia of chronic renal failure, stage 3 (moderate)   Dyslipidemia   Coronary artery disease involving native coronary artery of native heart without angina pectoris   BPH (benign prostatic hyperplasia)   Chronic diastolic congestive heart failure (HCC)   Syncope   Acute kidney injury superimposed on chronic kidney disease (HCC)   Syncopal episodes   UTI (urinary tract infection) due to urinary indwelling catheter [facility](HCC)  Discharge Condition: stable  Diet recommendation: regular  Filed Weights   04/21/15 0705 04/22/15 0534 04/23/15 0520  Weight: 61.7 kg (136 lb 0.4 oz) 61.1 kg (134 lb 11.2 oz) 61.3 kg (135 lb 2.3 oz)    History of present illness:  See H&P, Labs, Consult and Test reports for all details in brief, patient is a 82 y.o. male with medical history significant for coronary artery disease with stent, dementia, bladder outlet obstruction with indwelling Foley, chronic diastolic CHF, and chronic kidney disease stage II-III who presented to the ED with hypotension and syncopal episode. He was admitted on 4/18.  Hospital Course Recurrent syncopal episode with hypotension - Patient with several admissions just this month alone on 4/12, 4/13, 4/18, felt to be dehydrated/have a UTI, received adequate treatment, however he gets home and shortly thereafter he has recurrent episode. Discussed with the daughter at bedside, patient has regular appetite and appears to have adequate hydration at home. He had a similar admission in January 2016, and was worked up  by cardiology and electrophysiology felt that he has a noncardiac etiology that time. Cortisol was checked at 8 PM and he was found to be normal at 13, ruling against adrenal insufficiency. His blood pressure has been stable since admission, hold his Cozaar, metoprolol, Imdur- cardiology and neurology consulted and have followed while hospitalized without clear cut cardiac or neurologic etiology for his syncopal episodes. Since his blood pressure has been stable off of all his home antihypertensives will recommend to hold for the time being and monitor BP at home and his PCP office. Discussed with the daughters at bedside, they've a blood pressure cuff at home and can monitor at home as well. Discussed with Dr. Lavon PaganiniNandigam, start florinef for component of orthostasis. Obtained CT angio neck which was negative for significant carotid or vertebral stenosis.  Hypothyroidism- TSH normal Recent UTI - patient on ceftriaxone here, urine cultures remain negative, no antibiotics on discharge  Hypertension - Hold home antihypertensives, blood pressures remain within normal limits, if he will start to come hypertensive medications can reintroduced as an outpatient  Dementia - Stable CKD II-III - Cr stable CAD - Suffered NSTEMI in 2004 with DES placed to Cfx; 50% distal RCA lesion also noted at that time. No angina or acute EKG findings. Continue home regimen  Chronic diastolic CHF - He appears euvolemic on exam  Procedures:  None    Consultations:  Cardiology   Neurology   Discharge Exam: Filed Vitals:   04/22/15 1345 04/22/15 2220 04/23/15 0520 04/23/15 1300  BP: 117/65 147/75 135/72 135/72  Pulse: 85 88 83 94  Temp: 98.2 F (36.8 C) 98.4 F (36.9 C) 98.5 F (36.9  C) 98.5 F (36.9 C)  TempSrc: Oral Oral Oral Oral  Resp: Weight:   61.3 kg (135 lb 2.3 oz)   SpO2: 99% 100% 99% 100%    General: NAD Cardiovascular: RRR Respiratory: CTA biL  Discharge Instructions Activity:  As  tolerated   Get Medicines reviewed and adjusted: Please take all your medications with you for your next visit with your Primary MD  Please request your Primary MD to go over all hospital tests and procedure/radiological results at the follow up, please ask your Primary MD to get all Hospital records sent to his/her office.  If you experience worsening of your admission symptoms, develop shortness of breath, life threatening emergency, suicidal or homicidal thoughts you must seek medical attention immediately by calling 911 or calling your MD immediately if symptoms less severe.  You must read complete instructions/literature along with all the possible adverse reactions/side effects for all the Medicines you take and that have been prescribed to you. Take any new Medicines after you have completely understood and accpet all the possible adverse reactions/side effects.   Do not drive when taking Pain medications.   Do not take more than prescribed Pain, Sleep and Anxiety Medications  Special Instructions: If you have smoked or chewed Tobacco in the last 2 yrs please stop smoking, stop any regular Alcohol and or any Recreational drug use.  Wear Seat belts while driving.  Please note  You were cared for by a hospitalist during your hospital stay. Once you are discharged, your primary care physician will handle any further medical issues. Please note that NO REFILLS for any discharge medications will be authorized once you are discharged, as it is imperative that you return to your primary care physician (or establish a relationship with a primary care physician if you do not have one) for your aftercare needs so that they can reassess your need for medications and monitor your lab values.    Medication List    STOP taking these medications        ciprofloxacin 500 MG tablet  Commonly known as:  CIPRO     isosorbide mononitrate 30 MG 24 hr tablet  Commonly known as:  IMDUR      losartan 50 MG tablet  Commonly known as:  COZAAR     metoprolol succinate 50 MG 24 hr tablet  Commonly known as:  TOPROL-XL      TAKE these medications        aspirin EC 81 MG tablet  Take 81 mg by mouth daily.     clopidogrel 75 MG tablet  Commonly known as:  PLAVIX  Take 1 tablet (75 mg total) by mouth daily.     CRANBERRY PO  Take 1 tablet by mouth daily.     dorzolamide 2 % ophthalmic solution  Commonly known as:  TRUSOPT  Place 1 drop into the right eye 2 (two) times daily.     finasteride 5 MG tablet  Commonly known as:  PROSCAR  Take 1 tablet (5 mg total) by mouth daily.     fludrocortisone 0.1 MG tablet  Commonly known as:  FLORINEF  Take 0.5 tablets (0.05 mg total) by mouth daily.     latanoprost 0.005 % ophthalmic solution  Commonly known as:  XALATAN  Place 1 drop into both eyes at bedtime.     levothyroxine 75 MCG tablet  Commonly known as:  SYNTHROID, LEVOTHROID  Take 75 mcg by mouth daily.  meloxicam 15 MG tablet  Commonly known as:  MOBIC  Take 15 mg by mouth daily.     QUEtiapine 25 MG tablet  Commonly known as:  SEROQUEL  Take 1 tablet (25 mg total) by mouth at bedtime. For behaviors     ranitidine 75 MG tablet  Commonly known as:  ZANTAC  Take 75 mg by mouth daily.     simvastatin 40 MG tablet  Commonly known as:  ZOCOR  Take 40 mg by mouth daily at 6 PM.     timolol 0.5 % ophthalmic solution  Commonly known as:  TIMOPTIC  Place 1 drop into both eyes 2 (two) times daily.     Vitamin D (Ergocalciferol) 50000 units Caps capsule  Commonly known as:  DRISDOL  Take 50,000 Units by mouth 2 (two) times a week.       Follow-up Information    Follow up with [name], MD. Schedule an appointment as soon as possible for a visit in 1 week.   Specialty:  Internal Medicine   Contact information:   [address] [place] [address] [phone]       Follow up with HAGER, BRYAN, PA-C. Schedule an appointment as  soon as possible for a visit on 05/04/2015.   Specialties:  Physician Assistant, Radiology, Interventional Cardiology   Why:   for post hospital   Contact information:   [address] [place] [address] [phone]       Follow up with [facility]   Why:  HH- resumed as previously ordered (HH-RN/PT/aide)   Contact information:   [address] [place] [address] [phone]       The results of significant diagnostics from this hospitalization (including imaging, microbiology, ancillary and laboratory) are listed below for reference.    Significant Diagnostic Studies: Ct Angio Head W/cm &/or Wo Cm  04/22/2015  CLINICAL DATA:  Syncope EXAM: CT ANGIOGRAPHY HEAD AND NECK TECHNIQUE: Multidetector CT imaging of the head and neck was performed using the standard protocol during bolus administration of intravenous contrast. Multiplanar CT image reconstructions and MIPs were obtained to evaluate the vascular anatomy. Carotid stenosis measurements (when applicable) are obtained utilizing NASCET criteria, using the distal internal carotid diameter as the denominator. CONTRAST:  50 mL Isovue 370 IV COMPARISON:  MRI head 11/06/2014 FINDINGS: CT HEAD Brain: Moderate atrophy. Moderate chronic microvascular ischemic change in the white matter. No acute infarct. Negative for hemorrhage or mass. Calvarium and skull base: Negative Paranasal sinuses: Mucosal edema and bony thickening of the right maxillary sinus due to chronic sinusitis Orbits: Bilateral ocular implants or glaucoma. No orbital mass lesion. CTA NECK Aortic arch: Normal aortic arch. Three vessel aortic arch. Proximal great vessels widely patent. Lung apices clear. Right carotid system: Right carotid is widely patent without significant stenosis or dissection. Early atherosclerotic plaque in the aortic bulb. Left carotid system: Left common carotid artery widely patent without stenosis. Early  atherosclerotic plaque in the carotid bulb. Vertebral arteries:Both vertebral arteries patent to the basilar without stenosis. Skeleton: Advanced cervical spine degenerative change with kyphosis and anterior slip C4 on C5 which appears chronic. Negative for fracture. Other neck: No mass or adenopathy in the neck. CTA HEAD Anterior circulation: Mild atherosclerotic calcification in the cavernous carotid bilaterally. No significant carotid stenosis. Anterior and middle cerebral arteries patent bilaterally without significant stenosis. Posterior circulation: Both vertebral arteries patent to the basilar. PICA patent bilaterally. Basilar patent. Superior cerebellar and posterior cerebral  arteries patent bilaterally with mild stenosis. Venous sinuses: Patent Anatomic variants: Negative for cerebral aneurysm. Delayed phase: Normal enhancement on delayed imaging. No enhancing mass. IMPRESSION: No significant carotid or vertebral stenosis in the neck. Mild atherosclerotic disease in the cavernous carotid bilaterally. No significant intracranial stenosis. No intracranial vascular malformation. Chronic sinusitis Electronically Signed   By: Marlan Palau M.D.   On: 04/22/2015 21:11   Dg Chest 2 View  04/15/2015  CLINICAL DATA:  Syncope EXAM: CHEST  2 VIEW COMPARISON:  04/13/2015 FINDINGS: Heart size and vascularity normal. Negative for heart failure. Negative for pneumonia. No mass lesion. Minimal right pleural effusion. IMPRESSION: Minimal right pleural effusion.  Negative for pneumonia. Electronically Signed   By: Marlan Palau M.D.   On: 04/15/2015 16:17   Dg Chest 2 View  04/13/2015  CLINICAL DATA:  Syncope.  Dyspnea.  Onset tonight. EXAM: CHEST  2 VIEW COMPARISON:  None. FINDINGS: There is moderate aortic tortuosity. The lungs are grossly clear. No effusions. Normal pulmonary vasculature. IMPRESSION: No acute cardiopulmonary disease. Electronically Signed   By: Ellery Plunk M.D.   On: 04/13/2015 23:47   Ct  Angio Neck W/cm &/or Wo/cm  04/22/2015  CLINICAL DATA:  Syncope EXAM: CT ANGIOGRAPHY HEAD AND NECK TECHNIQUE: Multidetector CT imaging of the head and neck was performed using the standard protocol during bolus administration of intravenous contrast. Multiplanar CT image reconstructions and MIPs were obtained to evaluate the vascular anatomy. Carotid stenosis measurements (when applicable) are obtained utilizing NASCET criteria, using the distal internal carotid diameter as the denominator. CONTRAST:  50 mL Isovue 370 IV COMPARISON:  MRI head 11/06/2014 FINDINGS: CT HEAD Brain: Moderate atrophy. Moderate chronic microvascular ischemic change in the white matter. No acute infarct. Negative for hemorrhage or mass. Calvarium and skull base: Negative Paranasal sinuses: Mucosal edema and bony thickening of the right maxillary sinus due to chronic sinusitis Orbits: Bilateral ocular implants or glaucoma. No orbital mass lesion. CTA NECK Aortic arch: Normal aortic arch. Three vessel aortic arch. Proximal great vessels widely patent. Lung apices clear. Right carotid system: Right carotid is widely patent without significant stenosis or dissection. Early atherosclerotic plaque in the aortic bulb. Left carotid system: Left common carotid artery widely patent without stenosis. Early atherosclerotic plaque in the carotid bulb. Vertebral arteries:Both vertebral arteries patent to the basilar without stenosis. Skeleton: Advanced cervical spine degenerative change with kyphosis and anterior slip C4 on C5 which appears chronic. Negative for fracture. Other neck: No mass or adenopathy in the neck. CTA HEAD Anterior circulation: Mild atherosclerotic calcification in the cavernous carotid bilaterally. No significant carotid stenosis. Anterior and middle cerebral arteries patent bilaterally without significant stenosis. Posterior circulation: Both vertebral arteries patent to the basilar. PICA patent bilaterally. Basilar patent.  Superior cerebellar and posterior cerebral arteries patent bilaterally with mild stenosis. Venous sinuses: Patent Anatomic variants: Negative for cerebral aneurysm. Delayed phase: Normal enhancement on delayed imaging. No enhancing mass. IMPRESSION: No significant carotid or vertebral stenosis in the neck. Mild atherosclerotic disease in the cavernous carotid bilaterally. No significant intracranial stenosis. No intracranial vascular malformation. Chronic sinusitis Electronically Signed   By: Marlan Palau M.D.   On: 04/22/2015 21:11   Ct Chest Wo Contrast  04/15/2015  CLINICAL DATA:  Acute onset of syncope and generalized weakness. Initial encounter. EXAM: CT CHEST WITHOUT CONTRAST TECHNIQUE: Multidetector CT imaging of the chest was performed following the standard protocol without IV contrast. COMPARISON:  Chest radiograph performed earlier today at 3:59 p.m. FINDINGS: Minimal bilateral atelectasis is noted.  The lungs are otherwise clear. No pleural effusion or pneumothorax is seen. No masses are identified. Trace pericardial fluid remains within normal limits. A coronary artery stent is noted. The mediastinum is unremarkable in appearance. No mediastinal lymphadenopathy is seen. The great vessels are grossly unremarkable in appearance. The thyroid gland is not well assessed. No axillary lymphadenopathy is seen. The visualized portions of the liver and the spleen are unremarkable. The gallbladder is not well assessed. The visualized portions of the pancreas, adrenal glands and kidneys are grossly unremarkable. Mild nonspecific perinephric stranding is noted bilaterally. No acute osseous abnormalities are seen. Mild degenerative change is noted at the lower cervical spine. IMPRESSION: 1. No acute abnormality seen to explain the patient's symptoms. 2. Minimal bilateral atelectasis noted.  Lungs otherwise clear. Electronically Signed   By: Roanna Raider M.D.   On: 04/15/2015 19:21    Microbiology: Recent  Results (from the past 240 hour(s))  Urine culture     Status: None   Collection Time: 04/20/15  5:20 PM  Result Value Ref Range Status   Specimen Description URINE, CATHETERIZED  Final   Special Requests NONE  Final   Culture NO GROWTH 1 DAY  Final   Report Status 04/21/2015 FINAL  Final  MRSA PCR Screening     Status: Abnormal   Collection Time: 04/20/15 10:33 PM  Result Value Ref Range Status   MRSA by PCR POSITIVE (A) NEGATIVE Final    Comment:        The GeneXpert MRSA Assay (FDA approved for NASAL specimens only), is one component of a comprehensive MRSA colonization surveillance program. It is not intended to diagnose MRSA infection nor to guide or monitor treatment for MRSA infections. RESULT CALLED TO, READ BACK BY AND VERIFIED WITH: B JOHNSON,RN @0124  04/21/15 MKELLY     Labs: Basic Metabolic Panel:  Recent Labs Lab 04/17/15 0242 04/20/15 1612 04/21/15 0758 04/22/15 0548  NA 140 137 139 140  K 4.0 4.7 3.8 3.7  CL 109 106 108 110  CO2 21* 20* 19* 21*  GLUCOSE 75 82 78 81  BUN 16 15 16 13   CREATININE 1.07 1.22 0.92 0.84  CALCIUM 8.6* 9.3 8.7* 8.3*   Liver Function Tests:  Recent Labs Lab 04/20/15 1612 04/22/15 0548  AST 29 16  ALT 11* 10*  ALKPHOS 62 52  BILITOT 1.0 0.5  PROT 7.8 6.4*  ALBUMIN 3.2* 2.7*   CBC:  Recent Labs Lab 04/17/15 0242 04/20/15 1612 04/22/15 0548  WBC 6.1 6.6 5.3  NEUTROABS  --  3.7  --   HGB 10.3* 11.2* 10.0*  HCT 31.2* 35.2* 31.0*  MCV 89.1 88.7 88.1  PLT 165 160 168   Cardiac Enzymes:  Recent Labs Lab 04/18/15 2311 04/19/15 0530 04/20/15 2055 04/21/15 0247 04/21/15 0758  TROPONINI <0.03 <0.03 <0.03 <0.03 <0.03   CBG:  Recent Labs Lab 04/22/15 0709 04/22/15 1615 04/22/15 2215 04/23/15 0611 04/23/15 1117  GLUCAP 77 96 94 70 121*     Signed:  GHERGHE, COSTIN  [facility] 04/23/2015, 2:08 PM

## 2015-05-03 ENCOUNTER — Encounter (HOSPITAL_COMMUNITY): Payer: Self-pay | Admitting: *Deleted

## 2015-05-03 ENCOUNTER — Emergency Department (HOSPITAL_COMMUNITY): Payer: Medicare Other

## 2015-05-03 ENCOUNTER — Emergency Department (HOSPITAL_COMMUNITY)
Admission: EM | Admit: 2015-05-03 | Discharge: 2015-05-03 | Disposition: A | Discharge status: Home / Self Care | Payer: Medicare Other | Attending: Emergency Medicine | Admitting: Emergency Medicine

## 2015-05-03 DIAGNOSIS — R42 Dizziness and giddiness: Secondary | ICD-10-CM | POA: Diagnosis not present

## 2015-05-03 DIAGNOSIS — I10 Essential (primary) hypertension: Secondary | ICD-10-CM | POA: Insufficient documentation

## 2015-05-03 DIAGNOSIS — Z9861 Coronary angioplasty status: Secondary | ICD-10-CM | POA: Diagnosis not present

## 2015-05-03 DIAGNOSIS — E039 Hypothyroidism, unspecified: Secondary | ICD-10-CM | POA: Diagnosis not present

## 2015-05-03 DIAGNOSIS — R410 Disorientation, unspecified: Secondary | ICD-10-CM | POA: Diagnosis not present

## 2015-05-03 DIAGNOSIS — I959 Hypotension, unspecified: Secondary | ICD-10-CM | POA: Diagnosis not present

## 2015-05-03 DIAGNOSIS — Z79899 Other long term (current) drug therapy: Secondary | ICD-10-CM | POA: Insufficient documentation

## 2015-05-03 DIAGNOSIS — R55 Syncope and collapse: Secondary | ICD-10-CM | POA: Insufficient documentation

## 2015-05-03 DIAGNOSIS — F0390 Unspecified dementia without behavioral disturbance: Secondary | ICD-10-CM | POA: Diagnosis not present

## 2015-05-03 DIAGNOSIS — N390 Urinary tract infection, site not specified: Secondary | ICD-10-CM | POA: Insufficient documentation

## 2015-05-03 DIAGNOSIS — Z7952 Long term (current) use of systemic steroids: Secondary | ICD-10-CM | POA: Diagnosis not present

## 2015-05-03 DIAGNOSIS — E785 Hyperlipidemia, unspecified: Secondary | ICD-10-CM | POA: Diagnosis not present

## 2015-05-03 DIAGNOSIS — K5900 Constipation, unspecified: Secondary | ICD-10-CM | POA: Insufficient documentation

## 2015-05-03 DIAGNOSIS — Z791 Long term (current) use of non-steroidal anti-inflammatories (NSAID): Secondary | ICD-10-CM | POA: Insufficient documentation

## 2015-05-03 DIAGNOSIS — Z8619 Personal history of other infectious and parasitic diseases: Secondary | ICD-10-CM | POA: Diagnosis not present

## 2015-05-03 DIAGNOSIS — Z7902 Long term (current) use of antithrombotics/antiplatelets: Secondary | ICD-10-CM | POA: Insufficient documentation

## 2015-05-03 DIAGNOSIS — Z7982 Long term (current) use of aspirin: Secondary | ICD-10-CM | POA: Insufficient documentation

## 2015-05-03 DIAGNOSIS — Z87891 Personal history of nicotine dependence: Secondary | ICD-10-CM | POA: Diagnosis not present

## 2015-05-03 DIAGNOSIS — I2510 Atherosclerotic heart disease of native coronary artery without angina pectoris: Secondary | ICD-10-CM | POA: Insufficient documentation

## 2015-05-03 LAB — URINALYSIS, ROUTINE W REFLEX MICROSCOPIC
BILIRUBIN URINE: NEGATIVE
GLUCOSE, UA: NEGATIVE mg/dL
HGB URINE DIPSTICK: NEGATIVE
KETONES UR: NEGATIVE mg/dL
Nitrite: NEGATIVE
PH: 6.5 (ref 5.0–8.0)
Protein, ur: NEGATIVE mg/dL
Specific Gravity, Urine: 1.011 (ref 1.005–1.030)

## 2015-05-03 LAB — CBC WITH DIFFERENTIAL/PLATELET
BASOS ABS: 0 10*3/uL (ref 0.0–0.1)
Basophils Relative: 1 %
Eosinophils Absolute: 0.3 10*3/uL (ref 0.0–0.7)
Eosinophils Relative: 5 %
HEMATOCRIT: 38.4 % — AB (ref 39.0–52.0)
Hemoglobin: 12.4 g/dL — ABNORMAL LOW (ref 13.0–17.0)
LYMPHS PCT: 20 %
Lymphs Abs: 1.1 10*3/uL (ref 0.7–4.0)
MCH: 28.9 pg (ref 26.0–34.0)
MCHC: 32.3 g/dL (ref 30.0–36.0)
MCV: 89.5 fL (ref 78.0–100.0)
Monocytes Absolute: 0.7 10*3/uL (ref 0.1–1.0)
Monocytes Relative: 12 %
NEUTROS ABS: 3.5 10*3/uL (ref 1.7–7.7)
Neutrophils Relative %: 62 %
PLATELETS: 188 10*3/uL (ref 150–400)
RBC: 4.29 MIL/uL (ref 4.22–5.81)
RDW: 16.6 % — ABNORMAL HIGH (ref 11.5–15.5)
WBC: 5.6 10*3/uL (ref 4.0–10.5)

## 2015-05-03 LAB — COMPREHENSIVE METABOLIC PANEL
ALT: 15 U/L — AB (ref 17–63)
AST: 21 U/L (ref 15–41)
Albumin: 3.3 g/dL — ABNORMAL LOW (ref 3.5–5.0)
Alkaline Phosphatase: 73 U/L (ref 38–126)
Anion gap: 9 (ref 5–15)
BILIRUBIN TOTAL: 0.4 mg/dL (ref 0.3–1.2)
BUN: 17 mg/dL (ref 6–20)
CHLORIDE: 109 mmol/L (ref 101–111)
CO2: 25 mmol/L (ref 22–32)
CREATININE: 0.99 mg/dL (ref 0.61–1.24)
Calcium: 8.7 mg/dL — ABNORMAL LOW (ref 8.9–10.3)
GFR calc Af Amer: >60 mL/min (ref >60)
GLUCOSE: 101 mg/dL — AB (ref 65–99)
Potassium: 3.5 mmol/L (ref 3.5–5.1)
Sodium: 143 mmol/L (ref 135–145)
Total Protein: 7.6 g/dL (ref 6.5–8.1)

## 2015-05-03 LAB — I-STAT TROPONIN, ED
TROPONIN I, POC: 0.02 ng/mL (ref 0.00–0.08)
TROPONIN I, POC: 0.01 ng/mL (ref 0.00–0.08)

## 2015-05-03 LAB — URINE MICROSCOPIC-ADD ON

## 2015-05-03 LAB — MAGNESIUM: Magnesium: 1.9 mg/dL (ref 1.7–2.4)

## 2015-05-03 MED ORDER — DEXTROSE 5 % IV SOLN
1.0000 g | Freq: Once | INTRAVENOUS | Status: AC
  Administered 2015-05-03: 1 g via INTRAVENOUS
  Filled 2015-05-03: qty 10

## 2015-05-03 MED ORDER — CEPHALEXIN 500 MG PO CAPS: 500.0000 mg | ORAL_CAPSULE | Freq: Three times a day (TID) | ORAL | Status: DC

## 2015-05-03 NOTE — ED Provider Notes (Addendum)
CSN: [id number]     Arrival date & time 05/03/15  1301 History   First MD Initiated Contact with Patient 05/03/15 1320     Chief Complaint  Patient presents with  . Loss of Consciousness    changed the chief complaint after speaking with pt.  He does not recall anything until he "felt the bumps in the ambulance on the way over"     (Consider location/radiation/quality/duration/timing/severity/associated sxs/prior Treatment)  63 old male with a history of dementia, coronary artery disease, hypertension, hyperlipidemia, syncope with hypotensive episodes with multiple admissions in the last month, including 4/12, 4/13, 4/18.  On the first admission, patient was diagnosed with a urinary tract infection, and episode of syncope and hypotension was thought to be due to dehydration and UTI.  Cardiology felt that symptoms were likely secondary to overmedication versus autonomic dysfunction.  Neurology were consulted and recommended patient start Florinef for orthostasis. CTA neck was negative. Cortisol level was normal. Patient was also noted to have poor by mouth intake.  Family reports that since discharge on the 21st, patient has been doing well at home, with no syncopal episodes, no hypertension, and normal baseline mental status. Today, however patient developed altered mental status, decreased responsiveness, with an episode of hypotension with blood pressures 98 systolic with EMS and undetectable by home health. Following this episode, patient states family reports he has been confused which has happened on prior admissions.   Patient is a 82 y.o. male presenting with syncope.  Loss of Consciousness Associated symptoms: no chest pain, no fever, no headaches, no nausea, no seizures, no shortness of breath, no vomiting and no weakness    Woke up this morning, has been normal, normal routine of breakfast and morning medications, including eye drops  At 1042 home health couldn't get blood pressure, it  wouldn't read 98 systolic this AM, couldn't get it when he was at dinner table Began to be very sleepy, lethargic, not answering questions appropriately Now "talking out of his head", telling stories about bulldozers out the window, and people trying to take money. Has had similar episodes.   Since UTI, has been more confused.  Holding BP meds, keeping him hydrated, had been logical since left hospital last time until today. Over last 2-3 weeks.  After waking up today.    No numbness/weakness one side, no falls, no difficulty walking or talking.   Past Medical History  Diagnosis Date  . Sepsis (HCC)   . UTI (lower urinary tract infection)   . Dementia   . Syncope     4/11 (hospitalized) and 4/13  . CAD (coronary artery disease)   . Hypotension   . Syncope   . Hypothyroidism     s/p Thyroidectomy, partial  . HTN (hypertension)   . Hyperlipidemia    Past Surgical History  Procedure Laterality Date  . Coronary angioplasty with stent placement    . Thyroidectomy, partial    . Renal doppler  08/17/2005    Normal evaluation  . Cardiac catheterization  02/25/2002    Proximal L Circumflex 95% lesion, stented w/ 3x18-mm CYPHER stent avoiding the 2 L Marginal branch, jailing the 1st marginal, resulting in reduction of a 90-95% lesion to 0% residual  . Cardiovascular stress test  12/18/2006    EKG negative for ischemia, no significant ischemia demonstrated.  . Transthoracic echocardiogram  03/13/2002    EF >55%, moderate LVH,   . Back surgery     Family History  Problem Relation Age of  Onset  . Family history unknown: Yes   Social History  Substance Use Topics  . Smoking status: Former Games developer  . Smokeless tobacco: Never Used     Comment: Quit 50+ years ago.  . Alcohol Use: No    Review of Systems  Constitutional: Negative for fever.  HENT: Negative for sore throat.   Eyes: Negative for visual disturbance.  Respiratory: Negative for shortness of breath.   Cardiovascular:  Positive for syncope. Negative for chest pain.  Gastrointestinal: Positive for constipation. Negative for nausea, vomiting, abdominal pain and diarrhea.  Genitourinary: Negative for difficulty urinating.  Musculoskeletal: Negative for back pain and neck stiffness.  Skin: Negative for rash.  Neurological: Positive for light-headedness. Negative for seizures, syncope, facial asymmetry, speech difficulty, weakness, numbness and headaches.      Allergies  Tramadol  Home Medications   Prior to Admission medications   Medication Sig Start Date End Date Taking? Authorizing Provider  aspirin EC 81 MG tablet Take 81 mg by mouth daily.   Yes Historical Provider, MD  clopidogrel (PLAVIX) 75 MG tablet Take 1 tablet (75 mg total) by mouth daily. 10/30/14  Yes Runell Gess, MD  CRANBERRY PO Take 1 tablet by mouth daily.   Yes Historical Provider, MD  dorzolamide (TRUSOPT) 2 % ophthalmic solution Place 1 drop into the right eye 2 (two) times daily.  08/23/12  Yes Historical Provider, MD  finasteride (PROSCAR) 5 MG tablet Take 1 tablet (5 mg total) by mouth daily. Patient taking differently: Take 5 mg by mouth every evening.  10/13/14  Yes Albertine Grates, MD  fludrocortisone (FLORINEF) 0.1 MG tablet Take 0.5 tablets (0.05 mg total) by mouth daily. 04/23/15  Yes Costin Otelia Sergeant, MD  latanoprost (XALATAN) 0.005 % ophthalmic solution Place 1 drop into both eyes at bedtime.   Yes Historical Provider, MD  levothyroxine (SYNTHROID, LEVOTHROID) 75 MCG tablet Take 75 mcg by mouth daily. 11/06/14  Yes Historical Provider, MD  meloxicam (MOBIC) 15 MG tablet Take 15 mg by mouth daily. 02/17/15  Yes Historical Provider, MD  QUEtiapine (SEROQUEL) 25 MG tablet Take 1 tablet (25 mg total) by mouth at bedtime. For behaviors 02/25/15  Yes Levert Feinstein, MD  ranitidine (ZANTAC) 75 MG tablet Take 75 mg by mouth daily.   Yes Historical Provider, MD  simvastatin (ZOCOR) 40 MG tablet Take 40 mg by mouth daily at 6 PM.    Yes Historical  Provider, MD  timolol (TIMOPTIC) 0.5 % ophthalmic solution Place 1 drop into both eyes 2 (two) times daily.  09/18/12  Yes Historical Provider, MD  Vitamin D, Ergocalciferol, (DRISDOL) 50000 units CAPS capsule Take 50,000 Units by mouth 2 (two) times a week.   Yes Historical Provider, MD   BP 134/87 mmHg  Pulse 93  Temp(Src) 98.3 F (36.8 C) (Oral)  Resp 10  Wt 135 lb (61.236 kg)  SpO2 100% Physical Exam  Constitutional: He appears well-developed and well-nourished. No distress.  HENT:  Head: Normocephalic and atraumatic.  Eyes: Conjunctivae and EOM are normal.  Neck: Normal range of motion.  Cardiovascular: Normal rate, regular rhythm, normal heart sounds and intact distal pulses.  Exam reveals no gallop and no friction rub.   No murmur heard. Pulmonary/Chest: Effort normal and breath sounds normal. No respiratory distress. He has no wheezes. He has no rales.  Abdominal: Soft. He exhibits no distension. There is no tenderness. There is no guarding.  Musculoskeletal: He exhibits no edema.  Neurological: He is alert. GCS eye subscore is  4. GCS verbal subscore is 5. GCS motor subscore is 6.  Skin: Skin is warm and dry. He is not diaphoretic.  Nursing note and vitals reviewed.   ED Course  Procedures (including critical care time) Labs Review Labs Reviewed  CBC WITH DIFFERENTIAL/PLATELET - Abnormal; Notable for the following:    Hemoglobin 12.4 (*)    HCT 38.4 (*)    RDW 16.6 (*)    All other components within normal limits  COMPREHENSIVE METABOLIC PANEL - Abnormal; Notable for the following:    Glucose, Bld 101 (*)    Calcium 8.7 (*)    Albumin 3.3 (*)    ALT 15 (*)    All other components within normal limits  URINALYSIS, ROUTINE W REFLEX MICROSCOPIC (NOT [facility]) - Abnormal; Notable for the following:    Leukocytes, UA MODERATE (*)    All other components within normal limits  URINE MICROSCOPIC-ADD ON - Abnormal; Notable for the following:    Squamous Epithelial / LPF 0-5  (*)    Bacteria, UA FEW (*)    Casts HYALINE CASTS (*)    All other components within normal limits  CULTURE, BLOOD (ROUTINE X 2)  CULTURE, BLOOD (ROUTINE X 2)  MAGNESIUM  I-STAT TROPOININ, ED  Rosezena SensorI-STAT TROPOININ, ED    Imaging Review Dg Chest 2 View  05/03/2015  CLINICAL DATA:  Near syncope. EXAM: CHEST  2 VIEW COMPARISON:  Chest radiographs and chest CT dated 04/15/2015. FINDINGS: Normal sized heart. Small amount of increased density at the posterior lung bases on the lateral view in the area of previously suspected pleural fluid. Otherwise, clear lungs. Possible minimal residual pleural density posteriorly on the right. Normal vascularity. Tortuous aorta. Mild scoliosis. IMPRESSION: Small amount of probable atelectasis at the posterior lung bases on the lateral view. Electronically Signed   By: Beckie SaltsSteven  Reid M.D.   On: 05/03/2015 17:20   I have personally reviewed and evaluated these images and lab results as part of my medical decision-making.   EKG Interpretation   Date/Time:  Monday May 03 2015 13:04:25 EDT Ventricular Rate:  83 PR Interval:  170 QRS Duration: 119 QT Interval:  404 QTC Calculation: 475 R Axis:   -57 Text Interpretation:  Sinus rhythm Nonspecific IVCD with LAD Left  ventricular hypertrophy Anterior infarct, old Baseline wander in lead(s)  V2 No significant change since last tracing Confirmed by [facility] MD,  Nature Vogelsang ([id number]) on 05/03/2015 1:25:49 PM      MDM   Final diagnoses:  Near syncope  Confusion   [age] old male with a history of dementia, coronary artery disease, hypertension, hyperlipidemia, syncope with hypotensive episodes with multiple admissions in the last month, including 4/12, 4/13, 4/18.  On the first admission, patient was diagnosed with a urinary tract infection, and episode of syncope and hypotension was thought to be due to dehydration and UTI.  Cardiology felt that symptoms were likely secondary to overmedication versus autonomic dysfunction.   Neurology were consulted and recommended patient start Florinef for orthostasis. CTA neck was negative. Cortisol level was normal. Patient was also noted to have poor by mouth intake.  Family reports that since discharge on the 21st, patient has been doing well at home, with no syncopal episodes, no hypertension, and normal baseline mental status. Today, however patient developed altered mental status, decreased responsiveness, with an episode of hypotension with blood pressures 98 systolic with EMS and undetectable by home health. Following this episode, patient states family reports he has been confused which has  happened on prior admissions.   Patient presenting again with syncope and episode of hypotension. His vital signs are normal in the emergency department. His EKG does not show any acute changes. His labs are otherwise stable. Urinalysis shows possible urinary tract infection with 6-30 white blood cells, and given symptoms of increased confusion, blood cultures were completed and Rocephin were given for possible UTI as contributing factor. XR without clear pneumonia or CHF exacerbation.  Patient without focal neurologic changes and doubt CVA.  Patient has been taken off of his blood pressure medications, however is continue to take timolol eye drops which may contribute to the symptoms. Daughter notes that he appeared to be normal this morning prior to taking his timolol and other morning medications.  Discussed with hospitalist team. Dr. Konrad Dolores saw patient and discussed with Dr. Allyson Sabal of Cardiology. Pt with prior extensive inpt work up and feel that close outpatient follow up tomorrow with Dr. Allyson Sabal is appropriate.  Pt able to ambulate with assistance due to blindness without syncope or lightheadedness.  Reviewed BP readings at home which are all normal except for episode this AM, and pt has had normal BP here in the ED.  Discussed plan for close outpatient follow up with family who is in agreement  and states understanding. Will treat for possible UTI with keflex. Patient discharged in stable condition with understanding of reasons to return.        Alvira Monday, MD 05/03/15 1759

## 2015-05-03 NOTE — Consult Note (Signed)
Medical Consultation   Jeremy [name]  NWG:[id number]  DOB: 01-Jul-1925  DOA: 05/03/2015  PCP: [name], MD  Outpatient Specialists:  Patient Care Team: Dorothyann Peng, MD as PCP - General (Internal Medicine)   Requesting physician: EDP Reason for consultation: Presyncope and Confusion    History of Present Illness: Jeremy [name] is an 82 y.o. male  Jeremy [name] is a 82 y.o. male with medical history significant for hypotension with syncope, dementia, recent UTI on 04/15/2015, recent sepsis,hypothyroidism, presenting to the ED via EMS after experiencing near syncope at home. These symptoms follow several recent hospitalizations in  April of this year for UTI on 4/13, for UTI and 4/18 for recurrent hypotension. During the last admission,his Cozaar, metoprolol, and Imdur were held by cardiology, as the symptoms were felt to be due to overmedication versus autonomic dysfunction; Neurology initiated on Florinef  for orthostasis He remained stable during the hospitalization, with normal blood pressure. Pertinent studies, including CT and you have the neck was negative for carotid or vertebral stenosis. 2-D echo was only remarkable for grade 1 diastolic dysfunction, with normal ejection fraction 55-60% and normal LVF. After discharge, he did not report any complications, or changes in his blood pressure until today, when he developed altered mental status, decreased responsiveness, confusion,  with an episode of hypotension with blood pressures in the 98 systolic, per family members, no chest pain, fever, headaches, nausea, seizures or shortness of breath, or weakness were reported. He had taken off his medications. Prior to presentation, family reports that he was significantly confused, where he sounds like acute encephalopathy. His blood pressure medications were held, IV fluids were given, with significant improvement in his overall status. He was more comfortable, responsive,  and he was overall feeling better. No neurologic abnormalities were noted.   ED Course:  BP 134/87 mmHg  Pulse 93  Temp(Src) 98.3 F (36.8 C) (Oral)  Resp 10  Wt 61.236 kg (135 lb)  SpO2 100% EKG does not show any acute changes. Tn negativeLabs are otherwise stable. UA shows possible UTI, with 6-30 white blood cells, for which cultures were drawn, and he received processing 1 for possible UTI as a contributing factor.case was discussed with Dr. Allyson Sabal, Cardiology, who recommended that the patient be seen in his office in the morning, to better evaluate his cardiovascular status.  [facility] was requested to see the patient in consultation, with further recommendations.   Review of Systems:  As per HPI otherwise 10 point review of systems negative.     Past Medical History: Past Medical History  Diagnosis Date  . Sepsis (HCC)   . UTI (lower urinary tract infection)   . Dementia   . Syncope     4/11 (hospitalized) and 4/13  . CAD (coronary artery disease)   . Hypotension   . Syncope   . Hypothyroidism     s/p Thyroidectomy, partial  . HTN (hypertension)   . Hyperlipidemia     Past Surgical History: Past Surgical History  Procedure Laterality Date  . Coronary angioplasty with stent placement    . Thyroidectomy, partial    . Renal doppler  08/17/2005    Normal evaluation  . Cardiac catheterization  02/25/2002    Proximal L Circumflex 95% lesion, stented w/ 3x18-mm CYPHER stent avoiding the 2 L Marginal branch, jailing the 1st marginal, resulting in reduction of a 90-95% lesion to 0% residual  .  Cardiovascular stress test  12/18/2006    EKG negative for ischemia, no significant ischemia demonstrated.  . Transthoracic echocardiogram  03/13/2002    EF >55%, moderate LVH,   . Back surgery       Allergies:   Allergies  Allergen Reactions  . Tramadol Nausea And Vomiting     Social History:  reports that he has quit smoking. He has never used smokeless tobacco.  He reports that he does not drink alcohol or use illicit drugs.   Family History: Family History  Problem Relation Age of Onset  . Family history unknown: Yes    Family history reviewed and not pertinent    Physical Exam: Filed Vitals:   05/03/15 1315 05/03/15 1400 05/03/15 1500 05/03/15 1515  BP: 139/83 140/91 149/88 134/87  Pulse: 85 88 96 93  Temp:      TempSrc:      Resp:  8 12 10   Weight:      SpO2: 100% 100% 100% 100%    Constitutional: Appears calm,  alert and awake, oriented x3, not in any acute distress. Eyes: PERLA, EOMI, irises appear normal, anicteric sclera,  ENMT: external ears and nose appear normal, normal hearing or hard of hearing. Lips appears normal, oropharynx mucosa, tongue, posterior pharynx appear normal  Neck: neck appears normal, no masses, normal ROM, no thyromegaly, no JVD  CVS: S1-S2 clear, no murmur rubs or gallops, no LE edema, normal pedal pulses  Respiratory: clear to auscultation bilaterally, no wheezing, rales or rhonchi. Respiratory effort normal. No accessory muscle use.  Abdomen: soft nontender, nondistended, normal bowel sounds, no hepatosplenomegaly, no hernias  Musculoskeletal: no cyanosis, clubbing or edema noted bilaterally. Joint/bones/muscle exam, strength, contractures or atrophy Neuro: Cranial nerves II-XII intact, strength, sensation, reflexes Psych: Underlying dementia Skin: no rashes or lesions or ulcers, no induration or nodules   Data reviewed:  I have personally reviewed following labs and imaging studies Labs:  CBC:  Recent Labs Lab 05/03/15 1332  WBC 5.6  NEUTROABS 3.5  HGB 12.4*  HCT 38.4*  MCV 89.5  PLT 188    Basic Metabolic Panel:  Recent Labs Lab 05/03/15 1332  NA 143  K 3.5  CL 109  CO2 25  GLUCOSE 101*  BUN 17  CREATININE 0.99  CALCIUM 8.7*  MG 1.9   GFR Estimated Creatinine Clearance: 45.5 mL/min (by C-G formula based on Cr of 0.99). Liver Function Tests:  Recent Labs Lab  05/03/15 1332  AST 21  ALT 15*  ALKPHOS 73  BILITOT 0.4  PROT 7.6  ALBUMIN 3.3*   Urinalysis    Component Value Date/Time   COLORURINE YELLOW 05/03/2015 1354   APPEARANCEUR CLEAR 05/03/2015 1354   LABSPEC 1.011 05/03/2015 1354   PHURINE 6.5 05/03/2015 1354   GLUCOSEU NEGATIVE 05/03/2015 1354   HGBUR NEGATIVE 05/03/2015 1354   BILIRUBINUR NEGATIVE 05/03/2015 1354   KETONESUR NEGATIVE 05/03/2015 1354   PROTEINUR NEGATIVE 05/03/2015 1354   UROBILINOGEN 0.2 11/08/2014 0334   NITRITE NEGATIVE 05/03/2015 1354   LEUKOCYTESUR MODERATE* 05/03/2015 1354     Sepsis Labs Invalid input(s): PROCALCITONIN,  WBC,  LACTICIDVEN Microbiology No results found for this or any previous visit (from the past 240 hour(s)).   Inpatient Medications:   Scheduled Meds:  Continuous Infusions: . cefTRIAXone (ROCEPHIN)  IV       Radiological Exams on Admission: No results found.  Impression/Recommendations Active Problems:   * No active hospital problems. *   Presyncopal episode, recurrent, with possible UTI component. He was  hypotensive on arrival at 98/76 with improvement of his BP after initiation of IVF bolus at 500 cc 140/88. CBG 142 Tn neg to date. EKG  SR without any changes from prior. His blood pressure medications were held, IV fluids were given, with significant improvement in his overall status. He was more comfortable, responsive, and he was overall feeling better. No neurologic abnormalities were noted. Of note, last 2 D echo on 04/16/15 nl LVF EF 55-60% and normal wall motion, grade 1 dysfunction. Case discussed with Dr. Allyson Sabal, Cardiology who is to see that patient at his office on 5/2 and further investigate the medication regimen    Possible UTI, recurrent suggested by moderate leukocytes in urine. Afebrile. WBC 5.6  Cultures drawn. Given Rocephin x1 To be discharged on antibiotics by EDP, will follow results and can dc abx if cultures negative. -  F/u urine culture  All other  medical issues as per admitting team     Thank you for this consultation.  Our [facility] hospitalist team will follow the patient with you.   Time Spent: 40 min   [facility] E PA-C [facility] 05/03/2015, 5:23 PM

## 2015-05-03 NOTE — ED Notes (Signed)
Pt here for near syncope at home.  Pt had episode of near syncope at home when being sat up.  Pt has been seen several times recently for hypotension and syncope/near syncope (and recently once for UTI with the previously mentioned symptoms).  I was his nurse on 2 of the recent trips to the ED.  When EMS responded pt was alert but they could "barely palpate his pulse". Initial BP 98/76 and then during transport it was 140/88 HR 88, CBG 142.  Pt received a 500 bolus NS from ems and they placed a 20g IV in his LPFA, pt was NS.  No distress at this time

## 2015-05-03 NOTE — ED Notes (Signed)
Patient ambulated well in hallway. No CO of dizziness. Updated MD.

## 2015-05-04 ENCOUNTER — Encounter: Payer: Self-pay | Admitting: Physician Assistant

## 2015-05-04 ENCOUNTER — Ambulatory Visit (INDEPENDENT_AMBULATORY_CARE_PROVIDER_SITE_OTHER): Payer: Medicare Other | Admitting: Physician Assistant

## 2015-05-04 VITALS — BP 120/70 | HR 84 | Ht 72.0 in | Wt 129.0 lb

## 2015-05-04 DIAGNOSIS — G903 Multi-system degeneration of the autonomic nervous system: Secondary | ICD-10-CM | POA: Diagnosis not present

## 2015-05-04 DIAGNOSIS — R55 Syncope and collapse: Secondary | ICD-10-CM

## 2015-05-04 DIAGNOSIS — I5032 Chronic diastolic (congestive) heart failure: Secondary | ICD-10-CM | POA: Diagnosis not present

## 2015-05-04 MED ORDER — MIDODRINE HCL 5 MG PO TABS: 5.0000 mg | ORAL_TABLET | Freq: Three times a day (TID) | ORAL | Status: DC

## 2015-05-04 NOTE — Patient Instructions (Addendum)
Medication Instructions:  Your physician has recommended you make the following change in your medication:  1.  START the Midodrine 5 mg taking 1 tablet 3 times a day.  If you blood pressure falls under 100, you may take an extra 1/2 tablet   Labwork: None ordered  Testing/Procedures: None ordered  Follow-Up: Your physician recommends that you schedule a follow-up appointment in: 2 WEEKS WITH NP/PA & 3 MONTHS WITH DR. Allyson SabalBERRY   Any Other Special Instructions Will Be Listed Below (If Applicable).     If you need a refill on your cardiac medications before your next appointment, please call your pharmacy.

## 2015-05-04 NOTE — Progress Notes (Signed)
Patient ID: Jeremy [name], male   DOB: [birth year], 82 y.o.   MRN: [id number]    Date:  05/04/2015   ID:  Jeremy [name], DOB June 16, 1925, MRN [id number]  PCP:  [name], MD  Primary Cardiologist: Allyson Sabal    Chief Complaint  Patient presents with  . Follow-up    Blood pressure dropping and he's passing out     History of Present Illness: Jeremy [name] is a 82 y.o. male with a history of CAD. HTN, chronic diastolic CHF, and chronic kidney disease stage II-III, dementia, HTN, HL, bladder outlet obstruction with indwelling Foley and 2 recent admission this month who was presented to [facility] ED 04/19/17 for evaluation of hypotension.  History of coronary artery disease status post non-ST segment elevation myocardial infarction February 2004 treated with stenting of the circumflex using a Cypher drug-eluting stent. At that time he had 50% distal RCA lesion and normal LV function. He also had a 40% right renal artery stenosis. He was doing well on cardiac stand point when seen last by Dr. Allyson Sabal 10/20/2014.   The patient was admitted 04/13/15-04/14/15 with syncope episode --> felt due to hypotension and he was orthostatic in that time he was hydrated and discharged home on stable condition, However, admitted again 04/15/15 to 04/19/15 again for syncope episode while sitting at [employer] table. CT of chest was unremarkable. Echocardiogram done 04/16/2015 shows left ventricular function of 55-60%, no wall motion abnormality, grade 1 diastolic dysfunction, mild mitral regurgitation, mildly dilated left atrium and PA peak pressure of 31 mmHg. His syncope was attributed to vasovagal etiology at that time and the patient was discharged home 4/17 with physical therapy and treatment (Cipro) for a pseudomonal UTI.   The patient was noted to be hypotensive 70/50 by PT at home on 4/18 --> then sudden episode of syncope -->and sent to ED for further evaluation by EMS. His blood pressure was 115/70 in ED upon presentation  however while waiting in Triage Afterwards he had sudden episode of unresponsiveness for about 2 minutes. Per note " the patient was staring off, pale and diaphoretic with BP of 62/42" and CBG of 79". He was given 1L of NS in ED with improvement in BP. Lactic acid of 2.02. Hgb of 11.2 which appears at baseline. UA concerning for UTI. Treated with IV Rocephin. The patient admitted for observation.  Troponin x 3 negative. Normal plasma cortisol. Lytes normal. EKG showed sinus rhythm at rate of 66bpm. No acute changes. Held home BP medications.   Patient was seen again on 05/03/2015 after having another syncopal episode. His vital signs were normal in the emergency room. EKG did not show any acute changes. Labs were stable. He was also  Treated again with Rocephin for possible urinary tract infection. Chest x-ray did not show any pneumonia or CHF exacerbation.  Patient does have dementia but thinks he may have felt "woozy" before passing out. He otherwise is never a particular complaints. He is here with his daughter says he has a good appetite and his weight has been increasing. She says that he lost at least 30 pounds because when he was at rehabilitation they will just put the food in front of him and he was not able to feed himself so that would later just take it away.     The patient currently denies nausea, vomiting, fever, chest pain, shortness of breath, orthopnea, dizziness, PND, cough, congestion, abdominal pain, hematochezia, melena, lower extremity edema, claudication.  Wt Readings from  Last 3 Encounters:  05/04/15 129 lb (58.514 kg)  05/03/15 135 lb (61.236 kg)  04/23/15 135 lb 2.3 oz (61.3 kg)     Past Medical History  Diagnosis Date  . Sepsis (HCC)   . UTI (lower urinary tract infection)   . Dementia   . Syncope     4/11 (hospitalized) and 4/13  . CAD (coronary artery disease)   . Hypotension   . Syncope   . Hypothyroidism     s/p Thyroidectomy, partial  . HTN (hypertension)     . Hyperlipidemia     Current Outpatient Prescriptions  Medication Sig Dispense Refill  . aspirin EC 81 MG tablet Take 81 mg by mouth daily.    . clopidogrel (PLAVIX) 75 MG tablet Take 1 tablet (75 mg total) by mouth daily. 90 tablet 3  . CRANBERRY PO Take 1 tablet by mouth daily.    . dorzolamide (TRUSOPT) 2 % ophthalmic solution Place 1 drop into the right eye 2 (two) times daily.     . finasteride (PROSCAR) 5 MG tablet Take 1 tablet (5 mg total) by mouth daily. (Patient taking differently: Take 5 mg by mouth every evening. ) 30 tablet 0  . fludrocortisone (FLORINEF) 0.1 MG tablet Take 0.5 tablets (0.05 mg total) by mouth daily. 30 tablet 1  . latanoprost (XALATAN) 0.005 % ophthalmic solution Place 1 drop into both eyes at bedtime.    Marland Kitchen levothyroxine (SYNTHROID, LEVOTHROID) 75 MCG tablet Take 75 mcg by mouth daily.  0  . meloxicam (MOBIC) 15 MG tablet Take 15 mg by mouth daily.  0  . QUEtiapine (SEROQUEL) 25 MG tablet Take 1 tablet (25 mg total) by mouth at bedtime. For behaviors 90 tablet 3  . ranitidine (ZANTAC) 75 MG tablet Take 75 mg by mouth daily.    . simvastatin (ZOCOR) 40 MG tablet Take 40 mg by mouth daily at 6 PM.     . timolol (TIMOPTIC) 0.5 % ophthalmic solution Place 1 drop into both eyes 2 (two) times daily.     . Vitamin D, Ergocalciferol, (DRISDOL) 50000 units CAPS capsule Take 50,000 Units by mouth 2 (two) times a week.    . cephALEXin (KEFLEX) 500 MG capsule Take 1 capsule (500 mg total) by mouth 3 (three) times daily. (Patient not taking: Reported on 05/04/2015) 21 capsule 0  . midodrine (PROAMATINE) 5 MG tablet Take 1 tablet (5 mg total) by mouth 3 (three) times daily with meals. 270 tablet 1   No current facility-administered medications for this visit.    Allergies:    Allergies  Allergen Reactions  . Tramadol Nausea And Vomiting    Social History:  The patient  reports that he has quit smoking. He has never used smokeless tobacco. He reports that he does not  drink alcohol or use illicit drugs.   Family history:   Family History  Problem Relation Age of Onset  . Family history unknown: Yes    ROS:  Please see the history of present illness.  All other systems reviewed and negative.   PHYSICAL EXAM: VS:  BP 120/70 mmHg  Pulse 84  Ht 6' (1.829 m)  Wt 129 lb (58.514 kg)  BMI 17.49 kg/m2 Then, well developed, sitting in a wheelchair in no acute distress HEENT: Pupils are equal round react to light accommodation extraocular movements are intact.  Neck: no JVDNo cervical lymphadenopathy. Cardiac: Regular rate and rhythm without murmurs rubs or gallops. Lungs:  clear to auscultation bilaterally, no wheezing, rhonchi  or rales Abd: soft, nontender, positive bowel sounds all quadrants Ext: no lower extremity edema.  2+ radial and dorsalis pedis pulses. Skin: warm and dry Neuro:  Grossly normal    ASSESSMENT AND PLAN:  Problem List Items Addressed This Visit    Syncope and collapse - Primary   Relevant Medications   midodrine (PROAMATINE) 5 MG tablet   Chronic diastolic congestive heart failure (HCC)   Relevant Medications   midodrine (PROAMATINE) 5 MG tablet   Arterial hypotension   Relevant Medications   midodrine (PROAMATINE) 5 MG tablet      Jeremy [name] appears to be doing well today. His syncope is thought to be related to autonomic dysfunction resulting in hypotension. His blood pressure is 120/70. He's had at least 4 episodes of syncope in the last month and a half. He does take Florinef 0.05 mg daily for his hypertension. Given these had so many episodes have also added Midrin 5 mg 3 times a day. His daughter finds his blood pressure is dropping below 120 systolic she can give him another half tablet.  I did ask about compression socks however, the past has been very hard to get them on him and they cut off the circulation in his feet so he has not tolerated them.  Also recommended creasing sodium intake and suggested a NUUN  tablet(disolves in water) QOD, which has 320 mg of sodium.  FU in 3 moinths

## 2015-05-08 LAB — CULTURE, BLOOD (ROUTINE X 2)
CULTURE: NO GROWTH
Culture: NO GROWTH

## 2015-05-10 ENCOUNTER — Telehealth: Payer: Self-pay | Admitting: Cardiology

## 2015-05-10 NOTE — Telephone Encounter (Signed)
Close encounter 

## 2015-05-25 ENCOUNTER — Ambulatory Visit: Payer: Medicare Other | Admitting: Cardiology

## 2015-05-27 ENCOUNTER — Encounter: Payer: Self-pay | Admitting: Podiatry

## 2015-05-27 ENCOUNTER — Ambulatory Visit (INDEPENDENT_AMBULATORY_CARE_PROVIDER_SITE_OTHER): Payer: Medicare Other | Admitting: Podiatry

## 2015-05-27 VITALS — BP 133/88 | HR 76 | Resp 16

## 2015-05-27 DIAGNOSIS — M79605 Pain in left leg: Secondary | ICD-10-CM

## 2015-05-27 DIAGNOSIS — B351 Tinea unguium: Secondary | ICD-10-CM | POA: Diagnosis not present

## 2015-05-27 DIAGNOSIS — M79675 Pain in left toe(s): Secondary | ICD-10-CM

## 2015-05-27 DIAGNOSIS — M79604 Pain in right leg: Secondary | ICD-10-CM

## 2015-05-27 DIAGNOSIS — M79674 Pain in right toe(s): Secondary | ICD-10-CM

## 2015-05-27 NOTE — Progress Notes (Signed)
   Subjective:    Patient ID: Jeremy [name], male    DOB: [birth year], 82 y.o.   MRN: [id number]  HPI    Review of Systems  Eyes: Positive for visual disturbance.  Musculoskeletal: Positive for arthralgias and gait problem.  Psychiatric/Behavioral: Positive for hallucinations and confusion.  All other systems reviewed and are negative.      Objective:   Physical Exam        Assessment & Plan:

## 2015-05-27 NOTE — Progress Notes (Signed)
Subjective:     Patient ID: Jeremy [name], male   DOB: 1925-10-26, 82 y.o.   MRN: [id number]  HPI patient presents with caregiver with nail disease 1-5 of both feet with inability to wear shoe gear and inability to cut   Review of Systems  All other systems reviewed and are negative.      Objective:   Physical Exam  Constitutional: He is oriented to person, place, and time.  Cardiovascular: Intact distal pulses.   Musculoskeletal: Normal range of motion.  Neurological: He is oriented to person, place, and time.  Skin: Skin is warm and dry.  Nursing note and vitals reviewed.  neurovascular status was found to be intact with thick yellow brittle nailbeds 1-5 both feet that are painful. Patient's found of diminished range of motion and muscle strength was noted to have good digital perfusion and was well oriented 3     Assessment:     Severe discomfort with thickness and brittle changes to the nailbeds 1-5 both feet    Plan:     H&P and conditions reviewed. Debrided nailbeds 1-5 both feet with no iatrogenic bleeding noted

## 2015-06-01 ENCOUNTER — Encounter: Payer: Self-pay | Admitting: Cardiology

## 2015-06-01 ENCOUNTER — Ambulatory Visit (INDEPENDENT_AMBULATORY_CARE_PROVIDER_SITE_OTHER): Payer: Medicare Other | Admitting: Cardiology

## 2015-06-01 ENCOUNTER — Telehealth: Payer: Self-pay | Admitting: *Deleted

## 2015-06-01 VITALS — BP 115/72 | HR 92 | Ht 72.0 in | Wt 133.0 lb

## 2015-06-01 DIAGNOSIS — I951 Orthostatic hypotension: Secondary | ICD-10-CM | POA: Diagnosis not present

## 2015-06-01 NOTE — Telephone Encounter (Signed)
Caller advised on supplement use and precautions. Aware to call if new concerns or other questions.

## 2015-06-01 NOTE — Progress Notes (Signed)
06/01/2015 ESCO JOSLYN   07-30-1925  [id number]  Primary Physician [name], MD Primary Cardiologist: Dr Allyson Sabal  HPI:  82 y.o. male with a history of CAD. HTN, chronic diastolic CHF, and chronic kidney disease stage II-III, dementia, HTN, HL, bladder outlet obstruction with indwelling Foley and recent orthostatic hypotension with syncope. He saw Wilburt Finlay in the office 05/04/15 and Midodrine was added. The pt is here for f/u.  Current Outpatient Prescriptions  Medication Sig Dispense Refill  . aspirin EC 81 MG tablet Take 81 mg by mouth daily.    . clopidogrel (PLAVIX) 75 MG tablet Take 1 tablet (75 mg total) by mouth daily. 90 tablet 3  . CRANBERRY PO Take 1 tablet by mouth daily.    . dorzolamide (TRUSOPT) 2 % ophthalmic solution Place 1 drop into the right eye 2 (two) times daily.     . finasteride (PROSCAR) 5 MG tablet Take 1 tablet (5 mg total) by mouth daily. (Patient taking differently: Take 5 mg by mouth every evening. ) 30 tablet 0  . fludrocortisone (FLORINEF) 0.1 MG tablet Take 0.5 tablets (0.05 mg total) by mouth daily. 30 tablet 1  . latanoprost (XALATAN) 0.005 % ophthalmic solution Place 1 drop into both eyes at bedtime.    Marland Kitchen levothyroxine (SYNTHROID, LEVOTHROID) 75 MCG tablet Take 75 mcg by mouth daily.  0  . meloxicam (MOBIC) 15 MG tablet Take 15 mg by mouth daily.  0  . midodrine (PROAMATINE) 5 MG tablet Take 1 tablet (5 mg total) by mouth 3 (three) times daily with meals. 270 tablet 1  . QUEtiapine (SEROQUEL) 25 MG tablet Take 1 tablet (25 mg total) by mouth at bedtime. For behaviors 90 tablet 3  . ranitidine (ZANTAC) 75 MG tablet Take 75 mg by mouth daily.    . simvastatin (ZOCOR) 40 MG tablet Take 40 mg by mouth daily at 6 PM.     . timolol (TIMOPTIC) 0.5 % ophthalmic solution Place 1 drop into both eyes 2 (two) times daily.     . Vitamin D, Ergocalciferol, (DRISDOL) 50000 units CAPS capsule Take 50,000 Units by mouth 2 (two) times a week.    Marland Kitchen OVER THE  COUNTER MEDICATION Take 1 tablet by mouth 3 (three) times a week.     No current facility-administered medications for this visit.    Allergies  Allergen Reactions  . Tramadol Nausea And Vomiting    Social History   Social History  . Marital Status: Married    Spouse Name: N/A  . Number of Children: 4  . Years of Education: HS   Occupational History  . Retired    Social History Main Topics  . Smoking status: Former Games developer  . Smokeless tobacco: Never Used     Comment: Quit 50+ years ago.  . Alcohol Use: No  . Drug Use: No  . Sexual Activity: Not on file   Other Topics Concern  . Not on file   Social History Narrative   Lives with his wife.   Right-handed.   No caffeine use.     Review of Systems: General: negative for chills, fever, night sweats or weight changes.  Cardiovascular: negative for chest pain, dyspnea on exertion, edema, orthopnea, palpitations, paroxysmal nocturnal dyspnea or shortness of breath Dermatological: negative for rash Respiratory: negative for cough or wheezing Urologic: negative for hematuria Abdominal: negative for nausea, vomiting, diarrhea, bright red blood per rectum, melena, or hematemesis Neurologic: negative for visual changes, syncope, or dizziness All  other systems reviewed and are otherwise negative except as noted above.    Blood pressure 115/72, pulse 92, height 6' (1.829 m), weight 133 lb (60.328 kg).  General appearance: alert, cooperative, cachectic and no distress Lungs: clear to auscultation bilaterally Heart: regular rate and rhythm Extremities: no edema Neurologic: Grossly normal, uses a walker    ASSESSMENT AND PLAN:   Orthostatic hypotension On Florinef and Midodrine Seen today for f/u after medications adjusted 05/04/15   PLAN  The pt is doing well on Midodrine and Florinef- no further syncope, no signs of fluid retention. Same Rx, f/u Dr Allyson SabalBerry as scheduled.   Corine ShelterLuke Rachard Isidro PA-C 06/01/2015 12:02 PM

## 2015-06-01 NOTE — Patient Instructions (Signed)
Please keep your previously scheduled appointment with Dr Allyson SabalBerry in August.

## 2015-06-01 NOTE — Assessment & Plan Note (Signed)
On Florinef and Midodrine Seen today for f/u after medications adjusted 05/04/15

## 2015-06-01 NOTE — Telephone Encounter (Signed)
Agreed  Corine ShelterLUKE Kerriann Kamphuis PA-C 06/01/2015 2:23 PM

## 2015-06-01 NOTE — Telephone Encounter (Signed)
The Nuun company makes 3-4 different products.  As long as his is not the one with caffeine ([employer]) he should be fine

## 2015-06-01 NOTE — Telephone Encounter (Signed)
Daughter Gavin PoundDeborah calling to inform us of a supplement not on pt's med list. Supplement is called "nuun", effervescent tablet taken w water, daughter states patient takes this 3 times weekly for blood pressure support.  She wants to know if patient should continue to take this. [facility] and [facility] for [facility].

## 2015-06-08 ENCOUNTER — Encounter (HOSPITAL_COMMUNITY): Payer: Self-pay | Admitting: *Deleted

## 2015-06-08 ENCOUNTER — Inpatient Hospital Stay (HOSPITAL_COMMUNITY)
Admission: EM | Admit: 2015-06-08 | Discharge: 2015-06-10 | DRG: 690 | Disposition: A | Discharge status: Home / Self Care | Payer: Medicare Other | Attending: Internal Medicine | Admitting: Internal Medicine

## 2015-06-08 DIAGNOSIS — N390 Urinary tract infection, site not specified: Principal | ICD-10-CM

## 2015-06-08 DIAGNOSIS — F0390 Unspecified dementia without behavioral disturbance: Secondary | ICD-10-CM | POA: Diagnosis present

## 2015-06-08 DIAGNOSIS — I5032 Chronic diastolic (congestive) heart failure: Secondary | ICD-10-CM | POA: Diagnosis present

## 2015-06-08 DIAGNOSIS — I110 Hypertensive heart disease with heart failure: Secondary | ICD-10-CM | POA: Diagnosis present

## 2015-06-08 DIAGNOSIS — I2510 Atherosclerotic heart disease of native coronary artery without angina pectoris: Secondary | ICD-10-CM | POA: Diagnosis present

## 2015-06-08 DIAGNOSIS — R6510 Systemic inflammatory response syndrome (SIRS) of non-infectious origin without acute organ dysfunction: Secondary | ICD-10-CM | POA: Diagnosis not present

## 2015-06-08 DIAGNOSIS — Z888 Allergy status to other drugs, medicaments and biological substances status: Secondary | ICD-10-CM

## 2015-06-08 DIAGNOSIS — T402X5A Adverse effect of other opioids, initial encounter: Secondary | ICD-10-CM | POA: Diagnosis present

## 2015-06-08 DIAGNOSIS — Z79899 Other long term (current) drug therapy: Secondary | ICD-10-CM

## 2015-06-08 DIAGNOSIS — Z7902 Long term (current) use of antithrombotics/antiplatelets: Secondary | ICD-10-CM | POA: Diagnosis not present

## 2015-06-08 DIAGNOSIS — I10 Essential (primary) hypertension: Secondary | ICD-10-CM

## 2015-06-08 DIAGNOSIS — R338 Other retention of urine: Secondary | ICD-10-CM | POA: Diagnosis not present

## 2015-06-08 DIAGNOSIS — Z955 Presence of coronary angioplasty implant and graft: Secondary | ICD-10-CM | POA: Diagnosis not present

## 2015-06-08 DIAGNOSIS — Z87891 Personal history of nicotine dependence: Secondary | ICD-10-CM | POA: Diagnosis not present

## 2015-06-08 DIAGNOSIS — Z7982 Long term (current) use of aspirin: Secondary | ICD-10-CM

## 2015-06-08 DIAGNOSIS — I9589 Other hypotension: Secondary | ICD-10-CM

## 2015-06-08 DIAGNOSIS — I952 Hypotension due to drugs: Secondary | ICD-10-CM | POA: Diagnosis present

## 2015-06-08 DIAGNOSIS — I951 Orthostatic hypotension: Secondary | ICD-10-CM | POA: Diagnosis not present

## 2015-06-08 DIAGNOSIS — R112 Nausea with vomiting, unspecified: Secondary | ICD-10-CM

## 2015-06-08 DIAGNOSIS — E785 Hyperlipidemia, unspecified: Secondary | ICD-10-CM | POA: Diagnosis present

## 2015-06-08 LAB — URINALYSIS, ROUTINE W REFLEX MICROSCOPIC
BILIRUBIN URINE: NEGATIVE
Glucose, UA: NEGATIVE mg/dL
KETONES UR: NEGATIVE mg/dL
NITRITE: POSITIVE — AB
PROTEIN: 30 mg/dL — AB
SPECIFIC GRAVITY, URINE: 1.009 (ref 1.005–1.030)
pH: 7 (ref 5.0–8.0)

## 2015-06-08 LAB — CBC WITH DIFFERENTIAL/PLATELET
Basophils Absolute: 0 10*3/uL (ref 0.0–0.1)
Basophils Relative: 0 %
EOS PCT: 1 %
Eosinophils Absolute: 0.1 10*3/uL (ref 0.0–0.7)
HEMATOCRIT: 40.6 % (ref 39.0–52.0)
HEMOGLOBIN: 13.5 g/dL (ref 13.0–17.0)
LYMPHS PCT: 11 %
Lymphs Abs: 1 10*3/uL (ref 0.7–4.0)
MCH: 29.7 pg (ref 26.0–34.0)
MCHC: 33.3 g/dL (ref 30.0–36.0)
MCV: 89.2 fL (ref 78.0–100.0)
MONOS PCT: 14 %
Monocytes Absolute: 1.3 10*3/uL — ABNORMAL HIGH (ref 0.1–1.0)
NEUTROS ABS: 7 10*3/uL (ref 1.7–7.7)
Neutrophils Relative %: 74 %
Platelets: 179 10*3/uL (ref 150–400)
RBC: 4.55 MIL/uL (ref 4.22–5.81)
RDW: 15.1 % (ref 11.5–15.5)
WBC: 9.4 10*3/uL (ref 4.0–10.5)

## 2015-06-08 LAB — COMPREHENSIVE METABOLIC PANEL
ALT: 19 U/L (ref 17–63)
ANION GAP: 9 (ref 5–15)
AST: 27 U/L (ref 15–41)
Albumin: 3.9 g/dL (ref 3.5–5.0)
Alkaline Phosphatase: 92 U/L (ref 38–126)
BUN: 15 mg/dL (ref 6–20)
CO2: 25 mmol/L (ref 22–32)
Calcium: 9.2 mg/dL (ref 8.9–10.3)
Chloride: 107 mmol/L (ref 101–111)
Creatinine, Ser: 0.93 mg/dL (ref 0.61–1.24)
GFR calc Af Amer: >60 mL/min (ref >60)
GFR calc non Af Amer: >60 mL/min (ref >60)
GLUCOSE: 97 mg/dL (ref 65–99)
POTASSIUM: 3.6 mmol/L (ref 3.5–5.1)
Sodium: 141 mmol/L (ref 135–145)
Total Bilirubin: 0.6 mg/dL (ref 0.3–1.2)
Total Protein: 8.6 g/dL — ABNORMAL HIGH (ref 6.5–8.1)

## 2015-06-08 LAB — I-STAT CG4 LACTIC ACID, ED
LACTIC ACID, VENOUS: 2.03 mmol/L — AB (ref 0.5–2.0)
LACTIC ACID, VENOUS: 1.36 mmol/L (ref 0.5–2.0)

## 2015-06-08 LAB — URINE MICROSCOPIC-ADD ON

## 2015-06-08 LAB — LIPASE, BLOOD: LIPASE: 24 U/L (ref 11–51)

## 2015-06-08 LAB — LACTIC ACID, PLASMA
Lactic Acid, Venous: 1.6 mmol/L (ref 0.5–2.0)
Lactic Acid, Venous: 2 mmol/L (ref 0.5–2.0)

## 2015-06-08 LAB — MRSA PCR SCREENING: MRSA by PCR: POSITIVE — AB

## 2015-06-08 MED ORDER — MIDODRINE HCL 5 MG PO TABS
5.0000 mg | ORAL_TABLET | Freq: Three times a day (TID) | ORAL | Status: DC
  Administered 2015-06-08 – 2015-06-10 (×6): 5 mg via ORAL
  Filled 2015-06-08 (×9): qty 1

## 2015-06-08 MED ORDER — FLUDROCORTISONE ACETATE 0.1 MG PO TABS
0.0500 mg | ORAL_TABLET | Freq: Every day | ORAL | Status: DC
  Administered 2015-06-08 – 2015-06-10 (×3): 0.05 mg via ORAL
  Filled 2015-06-08 (×3): qty 0.5

## 2015-06-08 MED ORDER — MORPHINE SULFATE (PF) 4 MG/ML IV SOLN
4.0000 mg | INTRAVENOUS | Status: DC | PRN
  Administered 2015-06-08: 4 mg via INTRAVENOUS
  Filled 2015-06-08: qty 1

## 2015-06-08 MED ORDER — ENOXAPARIN SODIUM 40 MG/0.4ML ~~LOC~~ SOLN
40.0000 mg | SUBCUTANEOUS | Status: DC
  Administered 2015-06-08 – 2015-06-09 (×2): 40 mg via SUBCUTANEOUS
  Filled 2015-06-08 (×3): qty 0.4

## 2015-06-08 MED ORDER — SODIUM CHLORIDE 0.9 % IV BOLUS (SEPSIS)
1000.0000 mL | Freq: Once | INTRAVENOUS | Status: AC
  Administered 2015-06-08: 1000 mL via INTRAVENOUS

## 2015-06-08 MED ORDER — QUETIAPINE FUMARATE 25 MG PO TABS
25.0000 mg | ORAL_TABLET | Freq: Every day | ORAL | Status: DC
Start: 2015-06-08 — End: 2015-06-10
  Administered 2015-06-08 – 2015-06-09 (×2): 25 mg via ORAL
  Filled 2015-06-08 (×3): qty 1

## 2015-06-08 MED ORDER — LEVOTHYROXINE SODIUM 75 MCG PO TABS
75.0000 ug | ORAL_TABLET | Freq: Every day | ORAL | Status: DC
  Administered 2015-06-08 – 2015-06-10 (×3): 75 ug via ORAL
  Filled 2015-06-08 (×4): qty 1

## 2015-06-08 MED ORDER — CEFTRIAXONE SODIUM 1 G IJ SOLR
1.0000 g | INTRAMUSCULAR | Status: DC
  Administered 2015-06-09: 1 g via INTRAVENOUS
  Filled 2015-06-08: qty 10

## 2015-06-08 MED ORDER — DEXTROSE 5 % IV SOLN
2.0000 g | Freq: Once | INTRAVENOUS | Status: AC
  Administered 2015-06-08: 2 g via INTRAVENOUS
  Filled 2015-06-08: qty 2

## 2015-06-08 MED ORDER — CEFTRIAXONE SODIUM 1 G IJ SOLR: 1.0000 g | INTRAMUSCULAR | Status: DC

## 2015-06-08 MED ORDER — ASPIRIN EC 81 MG PO TBEC
81.0000 mg | DELAYED_RELEASE_TABLET | Freq: Every day | ORAL | Status: DC
  Administered 2015-06-08 – 2015-06-10 (×3): 81 mg via ORAL
  Filled 2015-06-08 (×3): qty 1

## 2015-06-08 MED ORDER — TIMOLOL MALEATE 0.5 % OP SOLN
1.0000 [drp] | Freq: Two times a day (BID) | OPHTHALMIC | Status: DC
  Administered 2015-06-08 – 2015-06-10 (×5): 1 [drp] via OPHTHALMIC
  Filled 2015-06-08: qty 5

## 2015-06-08 MED ORDER — FINASTERIDE 5 MG PO TABS
5.0000 mg | ORAL_TABLET | Freq: Every evening | ORAL | Status: DC
Start: 2015-06-08 — End: 2015-06-10
  Administered 2015-06-08 – 2015-06-09 (×2): 5 mg via ORAL
  Filled 2015-06-08 (×3): qty 1

## 2015-06-08 MED ORDER — LATANOPROST 0.005 % OP SOLN
1.0000 [drp] | Freq: Every day | OPHTHALMIC | Status: DC
  Administered 2015-06-08 – 2015-06-09 (×2): 1 [drp] via OPHTHALMIC
  Filled 2015-06-08 (×2): qty 2.5

## 2015-06-08 MED ORDER — MUPIROCIN 2 % EX OINT
1.0000 "application " | TOPICAL_OINTMENT | Freq: Two times a day (BID) | CUTANEOUS | Status: DC
  Administered 2015-06-08 – 2015-06-10 (×4): 1 via NASAL
  Filled 2015-06-08: qty 22

## 2015-06-08 MED ORDER — DORZOLAMIDE HCL 2 % OP SOLN
1.0000 [drp] | Freq: Two times a day (BID) | OPHTHALMIC | Status: DC
  Administered 2015-06-08 – 2015-06-10 (×5): 1 [drp] via OPHTHALMIC
  Filled 2015-06-08: qty 10

## 2015-06-08 MED ORDER — SODIUM CHLORIDE 0.9 % IV BOLUS (SEPSIS)
2000.0000 mL | Freq: Once | INTRAVENOUS | Status: AC
  Administered 2015-06-08: 1000 mL via INTRAVENOUS

## 2015-06-08 MED ORDER — SODIUM CHLORIDE 0.9 % IV SOLN
INTRAVENOUS | Status: DC
  Administered 2015-06-08: 23:00:00 via INTRAVENOUS

## 2015-06-08 MED ORDER — SIMVASTATIN 40 MG PO TABS
40.0000 mg | ORAL_TABLET | Freq: Every day | ORAL | Status: DC
  Administered 2015-06-08 – 2015-06-09 (×2): 40 mg via ORAL
  Filled 2015-06-08 (×3): qty 1

## 2015-06-08 MED ORDER — CHLORHEXIDINE GLUCONATE CLOTH 2 % EX PADS
6.0000 | MEDICATED_PAD | Freq: Every day | CUTANEOUS | Status: DC
  Administered 2015-06-09 – 2015-06-10 (×2): 6 via TOPICAL

## 2015-06-08 MED ORDER — FAMOTIDINE 10 MG PO TABS
10.0000 mg | ORAL_TABLET | Freq: Two times a day (BID) | ORAL | Status: DC
  Administered 2015-06-08 – 2015-06-10 (×5): 10 mg via ORAL
  Filled 2015-06-08 (×6): qty 1

## 2015-06-08 MED ORDER — CLOPIDOGREL BISULFATE 75 MG PO TABS
75.0000 mg | ORAL_TABLET | Freq: Every day | ORAL | Status: DC
  Administered 2015-06-08 – 2015-06-10 (×3): 75 mg via ORAL
  Filled 2015-06-08 (×3): qty 1

## 2015-06-08 NOTE — Progress Notes (Signed)
Pt and family aware of positive MRSA.  Educated about gowns and gloves.  Family elects not to wear them.

## 2015-06-08 NOTE — ED Notes (Signed)
Family at bedside. 

## 2015-06-08 NOTE — ED Notes (Signed)
Notified Dr Lynelle DoctorKnapp of Lactic 2.03

## 2015-06-08 NOTE — ED Notes (Signed)
Patient has been stuck 4 times to try and obtain labs. IV team called to collect labs and start 2nd IV

## 2015-06-08 NOTE — ED Notes (Signed)
MD at bedside. 

## 2015-06-08 NOTE — ED Provider Notes (Signed)
CSN: [id number]     Arrival date & time 06/08/15  [id number] History   First MD Initiated Contact with Patient 06/08/15 [phone]     Chief Complaint  Patient presents with  . Emesis    HPI Patient presents to the emergency room for complaints of nausea and vomiting. Patient recently had an episode of urinary retention. He just had a Foley catheter removed yesterday. Patient states however he has been urinating normally since then. This morning he started having nausea and vomiting. He vomited several times. He denies any trouble with any headache. He denies any abdominal pain. He denies any diarrhea. He denies any dizziness, numbness or weakness. he denies vertigo  Past Medical History  Diagnosis Date  . Sepsis (HCC)   . UTI (lower urinary tract infection)   . Dementia   . Syncope     4/11 (hospitalized) and 4/13  . CAD (coronary artery disease)   . Hypotension   . Syncope   . Hypothyroidism     s/p Thyroidectomy, partial  . HTN (hypertension)   . Hyperlipidemia    Past Surgical History  Procedure Laterality Date  . Coronary angioplasty with stent placement    . Thyroidectomy, partial    . Renal doppler  08/17/2005    Normal evaluation  . Cardiac catheterization  02/25/2002    Proximal L Circumflex 95% lesion, stented w/ 3x18-mm CYPHER stent avoiding the 2 L Marginal branch, jailing the 1st marginal, resulting in reduction of a 90-95% lesion to 0% residual  . Cardiovascular stress test  12/18/2006    EKG negative for ischemia, no significant ischemia demonstrated.  . Transthoracic echocardiogram  03/13/2002    EF >55%, moderate LVH,   . Back surgery     Family History  Problem Relation Age of Onset  . Family history unknown: Yes   Social History  Substance Use Topics  . Smoking status: Former Games developermoker  . Smokeless tobacco: Never Used     Comment: Quit 50+ years ago.  . Alcohol Use: No    Review of Systems  All other systems reviewed and are negative.     Allergies   Tramadol  Home Medications   Prior to Admission medications   Medication Sig Start Date End Date Taking? Authorizing Provider  aspirin EC 81 MG tablet Take 81 mg by mouth daily.   Yes Historical Provider, MD  clopidogrel (PLAVIX) 75 MG tablet Take 1 tablet (75 mg total) by mouth daily. 10/30/14  Yes Runell GessJonathan J Berry, MD  CRANBERRY PO Take 1 tablet by mouth daily.   Yes Historical Provider, MD  dorzolamide (TRUSOPT) 2 % ophthalmic solution Place 1 drop into the right eye 2 (two) times daily.  08/23/12  Yes Historical Provider, MD  finasteride (PROSCAR) 5 MG tablet Take 1 tablet (5 mg total) by mouth daily. Patient taking differently: Take 5 mg by mouth every evening.  10/13/14  Yes Albertine GratesFang Xu, MD  fludrocortisone (FLORINEF) 0.1 MG tablet Take 0.5 tablets (0.05 mg total) by mouth daily. 04/23/15  Yes Costin Otelia SergeantM Gherghe, MD  latanoprost (XALATAN) 0.005 % ophthalmic solution Place 1 drop into both eyes at bedtime.   Yes Historical Provider, MD  levothyroxine (SYNTHROID, LEVOTHROID) 75 MCG tablet Take 75 mcg by mouth daily. 11/06/14  Yes Historical Provider, MD  meloxicam (MOBIC) 15 MG tablet Take 15 mg by mouth daily. 02/17/15  Yes Historical Provider, MD  midodrine (PROAMATINE) 5 MG tablet Take 1 tablet (5 mg total) by mouth 3 (three) times daily  with meals. 05/04/15  Yes Dwana Melena, PA-C  OVER THE COUNTER MEDICATION Take 1 tablet by mouth See admin instructions. Drop 1 tablet in water daily and drink- Over the counter [employer]   Yes Historical Provider, MD  QUEtiapine (SEROQUEL) 25 MG tablet Take 1 tablet (25 mg total) by mouth at bedtime. For behaviors 02/25/15  Yes Levert Feinstein, MD  ranitidine (ZANTAC) 75 MG tablet Take 75 mg by mouth daily. Reported on 06/08/2015   Yes Historical Provider, MD  simvastatin (ZOCOR) 40 MG tablet Take 40 mg by mouth daily at 6 PM.    Yes Historical Provider, MD  timolol (TIMOPTIC) 0.5 % ophthalmic solution Place 1 drop into both eyes 2 (two) times daily.  09/18/12  Yes  Historical Provider, MD  Vitamin D, Ergocalciferol, (DRISDOL) 50000 units CAPS capsule Take 50,000 Units by mouth 2 (two) times a week.   Yes Historical Provider, MD   BP 181/118 mmHg  Pulse 123  Temp(Src) 99.2 F (37.3 C) (Oral)  Resp 15  SpO2 100% Physical Exam  Constitutional: He appears well-developed and well-nourished. No distress.  HENT:  Head: Normocephalic and atraumatic.  Right Ear: External ear normal.  Left Ear: External ear normal.  Eyes: Conjunctivae are normal. Right eye exhibits no discharge. Left eye exhibits no discharge. No scleral icterus.  Neck: Neck supple. No tracheal deviation present.  Cardiovascular: Normal rate, regular rhythm and intact distal pulses.   Pulmonary/Chest: Effort normal and breath sounds normal. No stridor. No respiratory distress. He has no wheezes. He has no rales.  Abdominal: Soft. Bowel sounds are normal. He exhibits no distension and no mass. There is tenderness in the suprapubic area. There is no rebound and no guarding.  Questionable bladder fullness  Musculoskeletal: He exhibits no edema or tenderness.  Neurological: He is alert. He has normal strength. No cranial nerve deficit (no facial droop, extraocular movements intact, no slurred speech) or sensory deficit. He exhibits normal muscle tone. He displays no seizure activity. Coordination normal.  Skin: Skin is warm and dry. No rash noted.  Psychiatric: He has a normal mood and affect.  Nursing note and vitals reviewed.   ED Course  Procedures   Medications  cefTRIAXone (ROCEPHIN) 2 g in dextrose 5 % 50 mL IVPB (2 g Intravenous New Bag/Given 06/08/15 1117)  cefTRIAXone (ROCEPHIN) 1 g in dextrose 5 % 50 mL IVPB (not administered)  morphine 4 MG/ML injection 4 mg (4 mg Intravenous Given 06/08/15 1117)  sodium chloride 0.9 % bolus 1,000 mL (0 mLs Intravenous Stopped 06/08/15 1022)    And  sodium chloride 0.9 % bolus 1,000 mL (0 mLs Intravenous Stopped 06/08/15 1117)   CRITICAL  CARE Performed by: ZOXWR,UEA Total critical care time: 30 minutes Critical care time was exclusive of separately billable procedures and treating other patients. Critical care was necessary to treat or prevent imminent or life-threatening deterioration. Critical care was time spent personally by me on the following activities: development of treatment plan with patient and/or surrogate as well as nursing, discussions with consultants, evaluation of patient's response to treatment, examination of patient, obtaining history from patient or surrogate, ordering and performing treatments and interventions, ordering and review of laboratory studies, ordering and review of radiographic studies, pulse oximetry and re-evaluation of patient's condition.  Labs Review Labs Reviewed  URINALYSIS, ROUTINE W REFLEX MICROSCOPIC (NOT AT [facility]) - Abnormal; Notable for the following:    APPearance CLOUDY (*)    Hgb urine dipstick LARGE (*)    Protein,  ur 30 (*)    Nitrite POSITIVE (*)    Leukocytes, UA LARGE (*)    All other components within normal limits  COMPREHENSIVE METABOLIC PANEL - Abnormal; Notable for the following:    Total Protein 8.6 (*)    All other components within normal limits  CBC WITH DIFFERENTIAL/PLATELET - Abnormal; Notable for the following:    Monocytes Absolute 1.3 (*)    All other components within normal limits  URINE MICROSCOPIC-ADD ON - Abnormal; Notable for the following:    Squamous Epithelial / LPF 0-5 (*)    Bacteria, UA FEW (*)    All other components within normal limits  I-STAT CG4 LACTIC ACID, ED - Abnormal; Notable for the following:    Lactic Acid, Venous 2.03 (*)    All other components within normal limits  CULTURE, BLOOD (ROUTINE X 2)  CULTURE, BLOOD (ROUTINE X 2)  URINE CULTURE  LIPASE, BLOOD  I-STAT CG4 LACTIC ACID, ED    Imaging Review No results found. I have personally reviewed and evaluated these images and lab results as part of my medical  decision-making.   EKG Interpretation   Date/Time:  Tuesday June 08 2015 08:38:22 EDT Ventricular Rate:  114 PR Interval:  148 QRS Duration: 121 QT Interval:  358 QTC Calculation: 493 R Axis:   -63 Text Interpretation:  Sinus tachycardia IVCD, consider atypical RBBB  Anterior infarct, old Since last tracing rate faster Confirmed by Shanteria Laye   MD-J, Yailene Badia ([id number]) on 06/08/2015 9:36:19 AM Also confirmed by [facility]  MD-J,  Berma Harts [id number]), editor Whitney Post, Cala Bradford [phone])  on 06/08/2015 9:42:32 AM      MDM   Final diagnoses:  UTI (lower urinary tract infection)  Essential hypertension  Nausea and vomiting, vomiting of unspecified type    Patient presented to the emergency room with nausea and vomiting. He has a history of recurrent urinary tract infections and had a Foley catheter removed yesterday. Patient initially denied any complaints of pain on exam he did have tenderness palpation in the suprapubic region.   Laboratory tests today suggest recurrent urinary tract infection. Lactic acid levels mildly elevated.  Patient has been given IV fluids and Rocephin.  In the ED he has been hypertensive.  Patient's daughter states that he used to be on several blood pressure medications but was having issues with recurrent hypotension. He currently takes Midrin and Florinef.  Considering his history of hypotension and his current infection I will hold off on giving him any blood pressure medications at this point.  I will give him a dose of pain medications to see if that helps with his blood pressure.  Plan on admission to the hospital for IV antibiotics and further treatment.    Linwood Dibbles, MD 06/08/15 [phone]

## 2015-06-08 NOTE — ED Notes (Signed)
Called [facility] per pt's family's request[facility]  Notified her that pt is admitted to ICU/SD unit and foley catheter was inserted.  Pam ICU CN notified and will inform family

## 2015-06-08 NOTE — ED Notes (Signed)
IV TEAM AT BEDSIDE 

## 2015-06-08 NOTE — ED Notes (Signed)
Brought in by EMS from home, c/o n/v that started this am , HX UTI ,  Urinary catheter removal yesterday.

## 2015-06-08 NOTE — Progress Notes (Signed)
Pharmacy Antibiotic Note  Pershing CoxJames T Dicostanzo is a 82 y.o. male admitted on 06/08/2015 with UTI.  Pt just had a foley catheter removed yesterday.  Pharmacy has been consulted for Ceftriaxone dosing.  Plan: Ceftriaxone 1g IV q24h. No further dose adjustments anticipated since this requires no renal adjustments.  Pharmacy will sign off at this time. Please re-consult if needed.      Temp (24hrs), Avg:99.2 F (37.3 C), Min:99.2 F (37.3 C), Max:99.2 F (37.3 C)   Recent Labs Lab 06/08/15 0900 06/08/15 1030  WBC 9.4  --   CREATININE 0.93  --   LATICACIDVEN  --  2.03*    Estimated Creatinine Clearance: 47.7 mL/min (by C-G formula based on Cr of 0.93).    Allergies  Allergen Reactions  . Tramadol Nausea And Vomiting    Antimicrobials this admission: 6/6 Ceftriaxone >>   Dose adjustments this admission: -  Microbiology results: 6/6 BCx: sent 6/6 UCx: sent    Thank you for allowing pharmacy to be a part of this patient's care.  Clance BollRunyon, Marshaun Lortie 06/08/2015 10:46 AM

## 2015-06-08 NOTE — H&P (Signed)
History and Physical  Jeremy [name] [name]:[id number] DOB: [birth year] DOA: 06/08/2015  Referring physician: Dr. Lynelle Doctor (ER Physician) PCP: [name], MD  Outpatient Specialists: Dr. Mena Goes (Urologist) Patient coming from: Home  Chief Complaint: Nausea/Vomiting and unstable blood pressure  HPI: 82 year old African American male with history of chronic bladder obstruction S/P insertion of foley's catheter that was removed yesterday by patient's Urologist according to the patient's family; recurrent UTI, CAD and dementia. Patient was seen alongside patient's daughter and Nurse. Daughter tells [place] that patient was urinating fine at home following removal of the [facility] catheter, but developed nausea and vomiting this morning. No associated fever or chills. Associated abdominal pain and bladder distention, with bladder scan revealing greater than 1000 cc of urine. UA is suggestive of possible UTI. Lactic acid is elevated, but WBC is normal. BP was initially on presentation but dropped to the 60's following administration of morphine. With volume resuscitation, the SBP is back to the 120's. The family is resistant to re-insertion of Foley's catheter despite thorough explanation and prefers to get instructions of patient's care from patient's Urologist. Patient will be admitted for further assessment and management.  ED Course: SBP dropped from to the 60's following administration of Morphine. Pertinent labs: Lactic acid is greater than 2. UA reveals positive nitrite  Review of Systems: As in HPI. 12 point review of system was done. Negative for fever, visual changes, sore throat, rash, new muscle aches, chest pain, SOB, dysuria, bleeding, n/v/abdominal pain.  Past Medical History  Diagnosis Date  . Sepsis (HCC)   . UTI (lower urinary tract infection)   . Dementia   . Syncope     4/11 (hospitalized) and 4/13  . CAD (coronary artery disease)   . Hypotension   . Syncope   .  Hypothyroidism     s/p Thyroidectomy, partial  . HTN (hypertension)   . Hyperlipidemia     Past Surgical History  Procedure Laterality Date  . Coronary angioplasty with stent placement    . Thyroidectomy, partial    . Renal doppler  08/17/2005    Normal evaluation  . Cardiac catheterization  02/25/2002    Proximal L Circumflex 95% lesion, stented w/ 3x18-mm CYPHER stent avoiding the 2 L Marginal branch, jailing the 1st marginal, resulting in reduction of a 90-95% lesion to 0% residual  . Cardiovascular stress test  12/18/2006    EKG negative for ischemia, no significant ischemia demonstrated.  . Transthoracic echocardiogram  03/13/2002    EF >55%, moderate LVH,   . Back surgery       reports that he has quit smoking. He has never used smokeless tobacco. He reports that he does not drink alcohol or use illicit drugs.  Allergies  Allergen Reactions  . Tramadol Nausea And Vomiting    Family History  Problem Relation Age of Onset  . Family history unknown: Yes     Prior to Admission medications   Medication Sig Start Date End Date Taking? Authorizing Provider  aspirin EC 81 MG tablet Take 81 mg by mouth daily.   Yes Historical Provider, MD  clopidogrel (PLAVIX) 75 MG tablet Take 1 tablet (75 mg total) by mouth daily. 10/30/14  Yes Runell Gess, MD  CRANBERRY PO Take 1 tablet by mouth daily.   Yes Historical Provider, MD  dorzolamide (TRUSOPT) 2 % ophthalmic solution Place 1 drop into the right eye 2 (two) times daily.  08/23/12  Yes Historical Provider, MD  finasteride (PROSCAR) 5  MG tablet Take 1 tablet (5 mg total) by mouth daily. Patient taking differently: Take 5 mg by mouth every evening.  10/13/14  Yes Albertine Grates, MD  fludrocortisone (FLORINEF) 0.1 MG tablet Take 0.5 tablets (0.05 mg total) by mouth daily. 04/23/15  Yes Costin Otelia Sergeant, MD  latanoprost (XALATAN) 0.005 % ophthalmic solution Place 1 drop into both eyes at bedtime.   Yes Historical Provider, MD  levothyroxine  (SYNTHROID, LEVOTHROID) 75 MCG tablet Take 75 mcg by mouth daily. 11/06/14  Yes Historical Provider, MD  meloxicam (MOBIC) 15 MG tablet Take 15 mg by mouth daily. 02/17/15  Yes Historical Provider, MD  midodrine (PROAMATINE) 5 MG tablet Take 1 tablet (5 mg total) by mouth 3 (three) times daily with meals. 05/04/15  Yes Dwana Melena, PA-C  OVER THE COUNTER MEDICATION Take 1 tablet by mouth See admin instructions. Drop 1 tablet in water daily and drink- Over the counter [employer]   Yes Historical Provider, MD  QUEtiapine (SEROQUEL) 25 MG tablet Take 1 tablet (25 mg total) by mouth at bedtime. For behaviors 02/25/15  Yes Levert Feinstein, MD  ranitidine (ZANTAC) 75 MG tablet Take 75 mg by mouth daily. Reported on 06/08/2015   Yes Historical Provider, MD  simvastatin (ZOCOR) 40 MG tablet Take 40 mg by mouth daily at 6 PM.    Yes Historical Provider, MD  timolol (TIMOPTIC) 0.5 % ophthalmic solution Place 1 drop into both eyes 2 (two) times daily.  09/18/12  Yes Historical Provider, MD  Vitamin D, Ergocalciferol, (DRISDOL) 50000 units CAPS capsule Take 50,000 Units by mouth 2 (two) times a week.   Yes Historical Provider, MD    Physical Exam: Filed Vitals:   06/08/15 1210 06/08/15 1220 06/08/15 1224 06/08/15 1230  BP:  85/61 111/75 148/90  Pulse:  99 99 107  Temp: 98.8 F (37.1 C)     TempSrc: Oral     Resp:  SpO2:  97% 96% 100%   Constitutional:  . Appears calm and comfortable. Cachectic. Eyes:  . Pallor. ENMT:  . external ears, nose appear normal  Neck:  . Supple. No Lymphadenopathy appreciated. No JVD.  Respiratory:  . CTA bilaterally, no w/r/r.  . Respiratory effort normal. No retractions or accessory muscle use Cardiovascular:  . RRR, no m/r/g . No LE extremity edema  Abdomen . Bladder distended to the umbilicus. Bladder scan reveals greater than 1000cc of urine retained.  Musculoskeletal:  .  Skin:  No rashes noted. Neurologic:  . Awake and alert. Moves all  limbs. Psychiatric:   Wt Readings from Last 3 Encounters:  06/01/15 60.328 kg (133 lb)  05/04/15 58.514 kg (129 lb)  05/03/15 61.236 kg (135 lb)    I have personally reviewed following labs and imaging studies  Labs on Admission:  CBC:  Recent Labs Lab 06/08/15 0900  WBC 9.4  NEUTROABS 7.0  HGB 13.5  HCT 40.6  MCV 89.2  PLT 179   Basic Metabolic Panel:  Recent Labs Lab 06/08/15 0900  NA 141  K 3.6  CL 107  CO2 25  GLUCOSE 97  BUN 15  CREATININE 0.93  CALCIUM 9.2   Liver Function Tests:  Recent Labs Lab 06/08/15 0900  AST 27  ALT 19  ALKPHOS 92  BILITOT 0.6  PROT 8.6*  ALBUMIN 3.9    Recent Labs Lab 06/08/15 0900  LIPASE 24   No results for input(s): AMMONIA in the last 168 hours. Coagulation Profile: No results for  input(s): INR, PROTIME in the last 168 hours. Cardiac Enzymes: No results for input(s): CKTOTAL, CKMB, CKMBINDEX, TROPONINI in the last 168 hours. BNP (last 3 results) No results for input(s): PROBNP in the last 8760 hours. HbA1C: No results for input(s): HGBA1C in the last 72 hours. CBG: No results for input(s): GLUCAP in the last 168 hours. Lipid Profile: No results for input(s): CHOL, HDL, LDLCALC, TRIG, CHOLHDL, LDLDIRECT in the last 72 hours. Thyroid Function Tests: No results for input(s): TSH, T4TOTAL, FREET4, T3FREE, THYROIDAB in the last 72 hours. Anemia Panel: No results for input(s): VITAMINB12, FOLATE, FERRITIN, TIBC, IRON, RETICCTPCT in the last 72 hours. Urine analysis:    Component Value Date/Time   COLORURINE YELLOW 06/08/2015 0911   APPEARANCEUR CLOUDY* 06/08/2015 0911   LABSPEC 1.009 06/08/2015 0911   PHURINE 7.0 06/08/2015 0911   GLUCOSEU NEGATIVE 06/08/2015 0911   HGBUR LARGE* 06/08/2015 0911   BILIRUBINUR NEGATIVE 06/08/2015 0911   KETONESUR NEGATIVE 06/08/2015 0911   PROTEINUR 30* 06/08/2015 0911   UROBILINOGEN 0.2 11/08/2014 0334   NITRITE POSITIVE* 06/08/2015 0911   LEUKOCYTESUR LARGE* 06/08/2015  0911   Sepsis Labs: @LABRCNTIP (procalcitonin:4,lacticidven:4) ) Recent Results (from the past 240 hour(s))  Blood Culture (routine x 2)     Status: None (Preliminary result)   Collection Time: 06/08/15 10:20 AM  Result Value Ref Range Status   Specimen Description BLOOD LEFT ANTECUBITAL  Final   Special Requests   Final    BOTTLES DRAWN AEROBIC AND ANAEROBIC 5 CC [facility]    Culture PENDING  Incomplete   Report Status PENDING  Incomplete      Radiological Exams on Admission: No results found.  Active Problems:   UTI (lower urinary tract infection)   SIRS (systemic inflammatory response syndrome) (HCC)   Assessment/Plan 1. SIRS/Sepsis secondary to complicated UTI 2. Hypotension 3. Chronic urinary retention 4. Documented Dementia 5. CAD, stable   Admit patient to step down unit  Aggressive hydration  Pan culture patient  IV antibiotics  Repeat Lactic acid level stat  Insert Foley's catheter  Urology consult - [facility]  Follow cultures  DVT prophylaxis: Sub cut Lovenox Code Status: Full Family Communication: Daughter Disposition Plan: Will depend on hospital course   Consults called: [facility] paged, awaiting a call back   Admission status: In patient    Time spent: Greater than 60 minutes  Berton MountSylvester Faelynn Wynder, MD  [facility] Pager #: [phone] 7PM-7AM contact night coverage as above  06/08/2015, 12:55 PM

## 2015-06-08 NOTE — Progress Notes (Signed)
UR completed interqual & xsolis

## 2015-06-08 NOTE — ED Notes (Signed)
Consult MD at bedside.

## 2015-06-08 NOTE — Consult Note (Signed)
Urology Consult  Referring physician:   Dr. Glendale Chard                                    CC: Dr. Eda Keys Reason for referral:   900 cc urinary retention  Impression/Assessment:    Recurrent urinary retention. Catheter placed for 900cc retention.   Plan:    Dr. Junious Silk to see in AM.  History of Present Illness:   82 year old African American male, MRSA +, with history of chronic bladder obstruction S/P insertion of foley's catheter that was removed yesterday by Dr. Junious Silk, according to the patient's family; recurrent UTI, CAD and dementia.     Patient was seen alongside patient's daughter and Nurse. Daughter tells [place] that patient was urinating fine at home following removal of the [facility] catheter, but developed nausea and vomiting this morning. No associated fever or chills. Associated abdominal pain and bladder distention, with bladder scan revealing greater than 1000 cc of urine. UA is suggestive of possible UTI. Lactic acid is elevated, but WBC is normal. BP was initially 148mHg on presentation but dropped to the 60's following administration of morphine. With volume resuscitation, the SBP is back to the 120's. The family is resistant to re-insertion of Foley's catheter despite thorough explanation and prefers to get instructions of patient's care from patient's Urologist. Patient will be admitted for further assessment and management.  Past Medical History  Diagnosis Date  . Sepsis (HAnchor   . UTI (lower urinary tract infection)   . Dementia   . Syncope     4/11 (hospitalized) and 4/13  . CAD (coronary artery disease)   . Hypotension   . Syncope   . Hypothyroidism     s/p Thyroidectomy, partial  . HTN (hypertension)   . Hyperlipidemia    Past Surgical History  Procedure Laterality Date  . Coronary angioplasty with stent placement    . Thyroidectomy, partial    . Renal doppler  08/17/2005    Normal evaluation  . Cardiac catheterization  02/25/2002    Proximal L  Circumflex 95% lesion, stented w/ 3x18-mm CYPHER stent avoiding the 2 L Marginal branch, jailing the 1st marginal, resulting in reduction of a 90-95% lesion to 0% residual  . Cardiovascular stress test  12/18/2006    EKG negative for ischemia, no significant ischemia demonstrated.  . Transthoracic echocardiogram  03/13/2002    EF >55%, moderate LVH,   . Back surgery      Medications:  Medication Sig Start Date End Date Taking? Authorizing Provider  aspirin EC 81 MG tablet Take 81 mg by mouth daily.   Yes Historical Provider, MD  clopidogrel (PLAVIX) 75 MG tablet Take 1 tablet (75 mg total) by mouth daily. 10/30/14  Yes JLorretta Harp MD  CRANBERRY PO Take 1 tablet by mouth daily.   Yes Historical Provider, MD  dorzolamide (TRUSOPT) 2 % ophthalmic solution Place 1 drop into the right eye 2 (two) times daily.  08/23/12  Yes Historical Provider, MD  finasteride (PROSCAR) 5 MG tablet Take 1 tablet (5 mg total) by mouth daily. Patient taking differently: Take 5 mg by mouth every evening.  10/13/14  Yes FFlorencia Reasons MD  fludrocortisone (FLORINEF) 0.1 MG tablet Take 0.5 tablets (0.05 mg total) by mouth daily. 04/23/15  Yes Costin MKarlyne Greenspan MD  latanoprost (XALATAN) 0.005 % ophthalmic solution Place 1 drop into both eyes at bedtime.  Yes Historical Provider, MD  levothyroxine (SYNTHROID, LEVOTHROID) 75 MCG tablet Take 75 mcg by mouth daily. 11/06/14  Yes Historical Provider, MD  meloxicam (MOBIC) 15 MG tablet Take 15 mg by mouth daily. 02/17/15  Yes Historical Provider, MD  midodrine (PROAMATINE) 5 MG tablet Take 1 tablet (5 mg total) by mouth 3 (three) times daily with meals. 05/04/15  Yes Brett Canales, PA-C  OVER THE COUNTER MEDICATION Take 1 tablet by mouth See admin instructions. Drop 1 tablet in water daily and drink- Over the counter [employer]   Yes Historical Provider, MD  QUEtiapine (SEROQUEL) 25 MG tablet Take 1 tablet (25  mg total) by mouth at bedtime. For behaviors 02/25/15  Yes Marcial Pacas, MD  ranitidine (ZANTAC) 75 MG tablet Take 75 mg by mouth daily. Reported on 06/08/2015   Yes Historical Provider, MD  simvastatin (ZOCOR) 40 MG tablet Take 40 mg by mouth daily at 6 PM.    Yes Historical Provider, MD  timolol (TIMOPTIC) 0.5 % ophthalmic solution Place 1 drop into both eyes 2 (two) times daily.  09/18/12  Yes Historical Provider, MD  Vitamin D, Ergocalciferol, (DRISDOL) 50000 units CAPS capsule Take 50,000 Units by mouth 2 (two) times a week.   Yes Historical Provider, MD         Allergies:  Allergies  Allergen Reactions  . Tramadol Nausea And Vomiting    Family History  Problem Relation Age of Onset  . Family history unknown: Yes    Social History:  reports that he has quit smoking. He has never used smokeless tobacco. He reports that he does not drink alcohol or use illicit drugs.  ROS  Physical Exam:  Vital signs in last 24 hours: Temp:  [98.1 F (36.7 C)-99.2 F (37.3 C)] 98.1 F (36.7 C) (06/06 2000) Pulse Rate:  [78-133] 88 (06/06 2000) Resp:  [6-20] 15 (06/06 2000) BP: (75-188)/(55-125) 111/63 mmHg (06/06 2000) SpO2:  [93 %-100 %] 100 % (06/06 2000) Weight:  [60.9 kg (134 lb 4.2 oz)] 60.9 kg (134 lb 4.2 oz) (06/06 1330) Physical Exam Eyes:   Pallor. ENMT:   external ears, nose appear normal Neck:   Supple. No Lymphadenopathy appreciated. No JVD.  Respiratory:   CTA bilaterally, no w/r/r.   Respiratory effort normal. No retractions or accessory muscle use Cardiovascular:   RRR, no m/r/g  No LE extremity edema  Abdomen  Bladder distended to the umbilicus. Bladder scan reveals greater than 1000cc of urine retained. Musculoskeletal:    Skin:  No rashes noted. Laboratory Data:  Results for orders placed or performed during the hospital encounter of 06/08/15 (from the past 72 hour(s))  Comprehensive metabolic panel     Status:  Abnormal   Collection Time: 06/08/15  9:00 AM  Result Value Ref Range   Sodium 141 135 - 145 mmol/L   Potassium 3.6 3.5 - 5.1 mmol/L   Chloride 107 101 - 111 mmol/L   CO2 25 22 - 32 mmol/L   Glucose, Bld 97 65 - 99 mg/dL   BUN 15 6 - 20 mg/dL   Creatinine, Ser 0.93 0.61 - 1.24 mg/dL   Calcium 9.2 8.9 - 10.3 mg/dL   Total Protein 8.6 (H) 6.5 - 8.1 g/dL   Albumin 3.9 3.5 - 5.0 g/dL   AST 27 15 - 41 U/L   ALT 19 17 - 63 U/L   Alkaline Phosphatase 92 38 - 126 U/L   Total Bilirubin 0.6 0.3 - 1.2 mg/dL   GFR calc  non Af Amer >60 >60 mL/min   GFR calc Af Amer >60 >60 mL/min    Comment: (NOTE) The eGFR has been calculated using the CKD EPI equation. This calculation has not been validated in all clinical situations. eGFR's persistently <60 mL/min signify possible Chronic Kidney Disease.    Anion gap 9 5 - 15  CBC WITH DIFFERENTIAL     Status: Abnormal   Collection Time: 06/08/15  9:00 AM  Result Value Ref Range   WBC 9.4 4.0 - 10.5 K/uL   RBC 4.55 4.22 - 5.81 MIL/uL   Hemoglobin 13.5 13.0 - 17.0 g/dL   HCT 40.6 39.0 - 52.0 %   MCV 89.2 78.0 - 100.0 fL   MCH 29.7 26.0 - 34.0 pg   MCHC 33.3 30.0 - 36.0 g/dL   RDW 15.1 11.5 - 15.5 %   Platelets 179 150 - 400 K/uL   Neutrophils Relative % 74 %   Lymphocytes Relative 11 %   Monocytes Relative 14 %   Eosinophils Relative 1 %   Basophils Relative 0 %   Neutro Abs 7.0 1.7 - 7.7 K/uL   Lymphs Abs 1.0 0.7 - 4.0 K/uL   Monocytes Absolute 1.3 (H) 0.1 - 1.0 K/uL   Eosinophils Absolute 0.1 0.0 - 0.7 K/uL   Basophils Absolute 0.0 0.0 - 0.1 K/uL   RBC Morphology TARGET CELLS    Smear Review LARGE PLATELETS PRESENT   Lipase, blood     Status: None   Collection Time: 06/08/15  9:00 AM  Result Value Ref Range   Lipase 24 11 - 51 U/L  Urinalysis, Routine w reflex microscopic (not at [facility])     Status: Abnormal   Collection Time: 06/08/15  9:11 AM  Result Value Ref Range   Color, Urine YELLOW YELLOW   APPearance CLOUDY (A) CLEAR    Specific Gravity, Urine 1.009 1.005 - 1.030   pH 7.0 5.0 - 8.0   Glucose, UA NEGATIVE NEGATIVE mg/dL   Hgb urine dipstick LARGE (A) NEGATIVE   Bilirubin Urine NEGATIVE NEGATIVE   Ketones, ur NEGATIVE NEGATIVE mg/dL   Protein, ur 30 (A) NEGATIVE mg/dL   Nitrite POSITIVE (A) NEGATIVE   Leukocytes, UA LARGE (A) NEGATIVE  Urine microscopic-add on     Status: Abnormal   Collection Time: 06/08/15  9:11 AM  Result Value Ref Range   Squamous Epithelial / LPF 0-5 (A) NONE SEEN   WBC, UA TOO NUMEROUS TO COUNT 0 - 5 WBC/hpf   RBC / HPF TOO NUMEROUS TO COUNT 0 - 5 RBC/hpf   Bacteria, UA FEW (A) NONE SEEN  Blood Culture (routine x 2)     Status: None (Preliminary result)   Collection Time: 06/08/15 10:20 AM  Result Value Ref Range   Specimen Description BLOOD LEFT ANTECUBITAL    Special Requests      BOTTLES DRAWN AEROBIC AND ANAEROBIC 5 CC Performed at [facility]    Culture PENDING    Report Status PENDING   I-Stat CG4 Lactic Acid, ED  (not at  [facility])     Status: Abnormal   Collection Time: 06/08/15 10:30 AM  Result Value Ref Range   Lactic Acid, Venous 2.03 (HH) 0.5 - 2.0 mmol/L   Comment NOTIFIED PHYSICIAN   I-Stat CG4 Lactic Acid, ED  (not at  [facility])     Status: None   Collection Time: 06/08/15 12:07 PM  Result Value Ref Range   Lactic Acid, Venous 1.36 0.5 - 2.0 mmol/L  MRSA PCR Screening     Status: Abnormal   Collection Time: 06/08/15  1:51 PM  Result Value Ref Range   MRSA by PCR POSITIVE (A) NEGATIVE    Comment:        The GeneXpert MRSA Assay (FDA approved for NASAL specimens only), is one component of a comprehensive MRSA colonization surveillance program. It is not intended to diagnose MRSA infection nor to guide or monitor treatment for MRSA infections. RESULT CALLED TO, READ BACK BY AND VERIFIED WITH: Rory Percy [id number] @ 1510 BY J SCOTTON   Lactic acid, plasma     Status: None   Collection Time: 06/08/15  1:59 PM  Result Value Ref Range   Lactic  Acid, Venous 1.6 0.5 - 2.0 mmol/L  Lactic acid, plasma     Status: None   Collection Time: 06/08/15  5:30 PM  Result Value Ref Range   Lactic Acid, Venous 2.0 0.5 - 2.0 mmol/L   Recent Results (from the past 240 hour(s))  Blood Culture (routine x 2)     Status: None (Preliminary result)   Collection Time: 06/08/15 10:20 AM  Result Value Ref Range Status   Specimen Description BLOOD LEFT ANTECUBITAL  Final   Special Requests   Final    BOTTLES DRAWN AEROBIC AND ANAEROBIC 5 CC Performed at [facility]    Culture PENDING  Incomplete   Report Status PENDING  Incomplete  MRSA PCR Screening     Status: Abnormal   Collection Time: 06/08/15  1:51 PM  Result Value Ref Range Status   MRSA by PCR POSITIVE (A) NEGATIVE Final    Comment:        The GeneXpert MRSA Assay (FDA approved for NASAL specimens only), is one component of a comprehensive MRSA colonization surveillance program. It is not intended to diagnose MRSA infection nor to guide or monitor treatment for MRSA infections. RESULT CALLED TO, READ BACK BY AND VERIFIED WITH: Rory Percy [id number] @ 6060 BY J SCOTTON    Creatinine:  Recent Labs  06/08/15 0900  CREATININE 0.93   B   Cohen Doleman I Zhane Donlan 06/08/2015, 10:20 PM

## 2015-06-08 NOTE — Consult Note (Signed)
Requesting Physician: Dr. Dalene SeltzerSchlossman     Reason for consultation:  Altered mental status, syncope  HPI:                                                                                                                                         Jeremy [name] is an 82 y.o. male patient who  was brought into the emergency room , with a history of dementia, coronary artery disease, hypertension, hyperlipidemia, syncope with hypotensive episodes with multiple admissions in the last month, including 4/12, 4/13, 4/18. On the first admission, patient was diagnosed with a urinary tract infection, and episode of syncope and hypotension was thought to be due to dehydration and UTI. Cardiology felt that symptoms were likely secondary to overmedication versus autonomic dysfunction. Neurology were consulted and recommended patient start Florinef for orthostasis. CTA neck was negative. Cortisol level was normal. Patient was also noted to have poor by mouth intake. Family reports that since discharge on the 21st, patient has been doing well at home, with no syncopal episodes, no hypertension, and normal baseline mental status. Today, however patient developed altered mental status, decreased responsiveness, with an episode of hypotension with blood pressures 98 systolic with EMS and undetectable by home health. Following this episode, patient states family reports he has been confused which has happened on prior admissions.   neurology service is consulted for evaluation of altered mental status and syncopal events.   patient unable to provide any history. Information obtained through review of the EMR  Past Medical History: Past Medical History  Diagnosis Date  . Sepsis (HCC)   . UTI (lower urinary tract infection)   . Dementia   . Syncope     4/11 (hospitalized) and 4/13  . CAD (coronary artery disease)   . Hypotension   . Syncope   . Hypothyroidism     s/p Thyroidectomy, partial  . HTN (hypertension)   .  Hyperlipidemia     Past Surgical History  Procedure Laterality Date  . Coronary angioplasty with stent placement    . Thyroidectomy, partial    . Renal doppler  08/17/2005    Normal evaluation  . Cardiac catheterization  02/25/2002    Proximal L Circumflex 95% lesion, stented w/ 3x18-mm CYPHER stent avoiding the 2 L Marginal branch, jailing the 1st marginal, resulting in reduction of a 90-95% lesion to 0% residual  . Cardiovascular stress test  12/18/2006    EKG negative for ischemia, no significant ischemia demonstrated.  . Transthoracic echocardiogram  03/13/2002    EF >55%, moderate LVH,   . Back surgery      Family History: Family History  Problem Relation Age of Onset  . Family history unknown: Yes    Social History:   reports that he has quit smoking. He has never used smokeless tobacco. He reports that he does not drink alcohol or use illicit drugs.  Allergies:  Allergies  Allergen Reactions  . Tramadol Nausea And Vomiting     Medications:                                                                                                                        No current facility-administered medications for this encounter. No current outpatient prescriptions on file.  Facility-Administered Medications Ordered in Other Encounters:  .  aspirin EC tablet 81 mg, 81 mg, Oral, Daily, [name], MD, 81 mg at 06/08/15 1537 .  [START ON 06/09/2015] cefTRIAXone (ROCEPHIN) 1 g in dextrose 5 % 50 mL IVPB, 1 g, Intravenous, Q24H, Teressa Lower, RPH .  [START ON 06/09/2015] Chlorhexidine Gluconate Cloth 2 % PADS 6 each, 6 each, Topical, Q0600, [name], MD .  clopidogrel (PLAVIX) tablet 75 mg, 75 mg, Oral, Daily, [name], MD, 75 mg at 06/08/15 1536 .  dorzolamide (TRUSOPT) 2 % ophthalmic solution 1 drop, 1 drop, Right Eye, BID, [name], MD, 1 drop at 06/08/15 1538 .  enoxaparin (LOVENOX) injection 40 mg, 40 mg, Subcutaneous, Q24H, [name], MD, 40 mg at 06/08/15 1730 .  famotidine (PEPCID) tablet 10 mg, 10 mg, Oral, BID, [name], MD, 10 mg at 06/08/15 1537 .  finasteride (PROSCAR) tablet 5 mg, 5 mg, Oral, QPM, [name], MD, 5 mg at 06/08/15 1729 .  fludrocortisone (FLORINEF) tablet 0.05 mg, 0.05 mg, Oral, Daily, [name], MD, 0.05 mg at 06/08/15 1536 .  latanoprost (XALATAN) 0.005 % ophthalmic solution 1 drop, 1 drop, Both Eyes, QHS, [name], MD .  levothyroxine (SYNTHROID, LEVOTHROID) tablet 75 mcg, 75 mcg, Oral, QAC breakfast, [name], MD, 75 mcg at 06/08/15 1537 .  midodrine (PROAMATINE) tablet 5 mg, 5 mg, Oral, TID WC, [name], MD, 5 mg at 06/08/15 1730 .  morphine 4 MG/ML injection 4 mg, 4 mg, Intravenous, Q1H PRN, Linwood Dibbles, MD, 4 mg at 06/08/15 1117 .  mupirocin ointment (BACTROBAN) 2 % 1 application, 1 application, Nasal, BID, [name], MD, 1 application at 06/08/15 1536 .  QUEtiapine (SEROQUEL) tablet 25 mg, 25 mg, Oral, QHS, [name], MD .  simvastatin (ZOCOR) tablet 40 mg, 40 mg, Oral, q1800, [name], MD, 40 mg at 06/08/15 1730 .  timolol (TIMOPTIC) 0.5 % ophthalmic solution 1 drop, 1 drop, Both Eyes, BID, [name], MD, 1 drop at 06/08/15 1537   ROS:  History    unobtainable from patient due to mental status and lack of cooperation     Neurologic Examination:                                                                                                    Today's Vitals   04/22/15 2220 04/23/15 0520 04/23/15 0533 04/23/15 1300  BP: 147/75 135/72  135/72  Pulse: 88 83  94  Temp: 98.4 F (36.9 C) 98.5 F (36.9 C)  98.5 F (36.9 C)  TempSrc: Oral Oral  Oral  Resp: 18 18  18   Weight:  61.3 kg (135 lb 2.3 oz)    SpO2: 100% 99%  100%  PainSc:   Asleep     Evaluation of  higher integrative functions including: Level of aleAnd drowsy, easily arousable to verbal commands Oriented to  place and person, not to month or year  Speech: fluent, no evidence of dysarthria or aphasia noted.  Test the following cranial nerves: 2-12 grossly intact Motor exam: able to demonstrate full 5/5 motor strength in all 4 extremities Examination of sensation :  grossly  symmetric sensation to pinprick in all 4 extremities and on face Examination of deep tendon reflexes:  symmetric in all extremities, normal plantars bilaterally Test coordination: Normal finger nose testing, with no evidence of limb appendicular ataxia or abnormal involuntary movements or tremors noted.   Lab Results: Basic Metabolic Panel:  Recent Labs Lab 06/08/15 0900  NA 141  K 3.6  CL 107  CO2 25  GLUCOSE 97  BUN 15  CREATININE 0.93  CALCIUM 9.2    Liver Function Tests:  Recent Labs Lab 06/08/15 0900  AST 27  ALT 19  ALKPHOS 92  BILITOT 0.6  PROT 8.6*  ALBUMIN 3.9    Recent Labs Lab 06/08/15 0900  LIPASE 24   No results for input(s): AMMONIA in the last 168 hours.  CBC:  Recent Labs Lab 06/08/15 0900  WBC 9.4  NEUTROABS 7.0  HGB 13.5  HCT 40.6  MCV 89.2  PLT 179    Cardiac Enzymes: No results for input(s): CKTOTAL, CKMB, CKMBINDEX, TROPONINI in the last 168 hours.  Lipid Panel: No results for input(s): CHOL, TRIG, HDL, CHOLHDL, VLDL, LDLCALC in the last 168 hours.  CBG: No results for input(s): GLUCAP in the last 168 hours.  Microbiology: Results for orders placed or performed during the hospital encounter of 04/20/15  Urine culture     Status: None   Collection Time: 04/20/15  5:20 PM  Result Value Ref Range Status   Specimen Description URINE, CATHETERIZED  Final   Special Requests NONE  Final   Culture NO GROWTH 1 DAY  Final   Report Status 04/21/2015 FINAL  Final  MRSA PCR Screening     Status: Abnormal   Collection Time: 04/20/15 10:33 PM  Result Value  Ref Range Status   MRSA by PCR POSITIVE (A) NEGATIVE Final    Comment:        The GeneXpert MRSA Assay (FDA approved for NASAL specimens only), is one component of a comprehensive  MRSA colonization surveillance program. It is not intended to diagnose MRSA infection nor to guide or monitor treatment for MRSA infections. RESULT CALLED TO, READ BACK BY AND VERIFIED WITH: B JOHNSON,RN  04/21/15 MKELLY      Assessment and plan:   LAURI TILL is an 82 y.o. male patient who presented withWith altered mental status and recurrent syncopal events. Neurology is consulted for further evaluation of syncope. Syncopal events are triggered due to systemic hypotension. Cardiology has been consulted for further evaluation. From neurological standpoint, recommend CT angiogram of the head and neck to rule out critical vertebrobasilar stenosis which could be contributing to this syncopal events with hyptension.  The CTA studies showed No significant carotid or vertebral stenosis in the neck. Mild atherosclerotic disease in the cavernous carotid bilaterally.No significant intracranial stenosis. No intracranial vascular Malformation. Defer further metabolic and cardiac workup to primary hospitalist team and cardiology service .  We'll follow-up PRN. please call for any questions.

## 2015-06-08 NOTE — ED Notes (Signed)
Bed: [id number] Expected date:  Expected time:  Means of arrival:  Comments: EMS Male 66 nausea hx of uti

## 2015-06-08 NOTE — ED Notes (Signed)
Patient can go up at 13:00

## 2015-06-08 NOTE — ED Notes (Signed)
Attempt at drawing labs - blood hemolyzed. Calling IV for 2nd IV and to draw labs

## 2015-06-08 NOTE — ED Notes (Addendum)
[facility] Hospitalist asked this nurse to step in the pt's room with him d/t pt's daughter who is pt's POA is refusing tx that is being suggested by hospitalist.  Pt's family is asking the MD to explain why pt needed a foley catheter in, daughter reported that pt has been voiding 37100ml-400ml each time pt voids at home.  Explained to family that per bladder scanner pt is retaining urine and has 1000ml in his bladder.  When family was made aware that pt's BP is dangerously low at the time, pt's daughter states "why can't you just give him the medicine that his doctor told us to give him when his blood pressure drops below 120."  Explained to them that this medicine is only for when pt is at home but at present, pt's BP is dangerously low and giving him his med now might not help increase it.  Family has requested for [facility] to not return in the pt's room and has requested for another hospitalist.  [facility] made aware of this.

## 2015-06-09 DIAGNOSIS — I951 Orthostatic hypotension: Secondary | ICD-10-CM

## 2015-06-09 DIAGNOSIS — R338 Other retention of urine: Secondary | ICD-10-CM

## 2015-06-09 LAB — CBC WITH DIFFERENTIAL/PLATELET
Basophils Absolute: 0 10*3/uL (ref 0.0–0.1)
Basophils Relative: 0 %
Eosinophils Absolute: 0.5 10*3/uL (ref 0.0–0.7)
Eosinophils Relative: 5 %
HCT: 30.3 % — ABNORMAL LOW (ref 39.0–52.0)
Hemoglobin: 10.1 g/dL — ABNORMAL LOW (ref 13.0–17.0)
Lymphocytes Relative: 22 %
Lymphs Abs: 1.9 10*3/uL (ref 0.7–4.0)
MCH: 29.9 pg (ref 26.0–34.0)
MCHC: 33.3 g/dL (ref 30.0–36.0)
MCV: 89.6 fL (ref 78.0–100.0)
Monocytes Absolute: 1.1 10*3/uL — ABNORMAL HIGH (ref 0.1–1.0)
Monocytes Relative: 12 %
Neutro Abs: 5.1 10*3/uL (ref 1.7–7.7)
Neutrophils Relative %: 60 %
Platelets: 164 10*3/uL (ref 150–400)
RBC: 3.38 MIL/uL — ABNORMAL LOW (ref 4.22–5.81)
RDW: 15.3 % (ref 11.5–15.5)
WBC: 8.5 10*3/uL (ref 4.0–10.5)

## 2015-06-09 LAB — BASIC METABOLIC PANEL
Anion gap: 8 (ref 5–15)
BUN: 20 mg/dL (ref 6–20)
CO2: 22 mmol/L (ref 22–32)
Calcium: 7.8 mg/dL — ABNORMAL LOW (ref 8.9–10.3)
Chloride: 111 mmol/L (ref 101–111)
Creatinine, Ser: 1.12 mg/dL (ref 0.61–1.24)
GFR calc Af Amer: >60 mL/min (ref >60)
GFR calc non Af Amer: 57 mL/min — ABNORMAL LOW (ref >60)
Glucose, Bld: 89 mg/dL (ref 65–99)
Potassium: 3.2 mmol/L — ABNORMAL LOW (ref 3.5–5.1)
Sodium: 141 mmol/L (ref 135–145)

## 2015-06-09 MED ORDER — DEXTROSE 5 % IV SOLN
2.0000 g | INTRAVENOUS | Status: DC
  Administered 2015-06-09: 2 g via INTRAVENOUS
  Filled 2015-06-09 (×2): qty 2

## 2015-06-09 MED ORDER — POTASSIUM CHLORIDE CRYS ER 20 MEQ PO TBCR
40.0000 meq | EXTENDED_RELEASE_TABLET | Freq: Once | ORAL | Status: AC
  Administered 2015-06-09: 40 meq via ORAL
  Filled 2015-06-09: qty 2

## 2015-06-09 NOTE — Progress Notes (Addendum)
Pharmacy Antibiotic Note  Jeremy [name] is a 82 y.o. male admitted on 06/08/2015 with UTI.  Pt just had a foley catheter removed the day prior to admission.  Has a new catheter in place now.  Patient started on Ceftriaxone on admission, now pharmacy has been consulted for Cefepime dosing since urine culture grew Pseudomonas Aeruginosa.  Plan: Cefepime 2g IV q24h. F/u sensitivity results and SCr, which has increased slightly today.  Height: 6' (182.9 cm) Weight: 134 lb 4.2 oz (60.9 kg) IBW/kg (Calculated) : 77.6  Temp (24hrs), Avg:98.1 F (36.7 C), Min:97.9 F (36.6 C), Max:98.3 F (36.8 C)   Recent Labs Lab 06/08/15 0900 06/08/15 1030 06/08/15 1207 06/08/15 1359 06/08/15 1730 06/09/15 0309  WBC 9.4  --   --   --   --  8.5  CREATININE 0.93  --   --   --   --  1.12  LATICACIDVEN  --  2.03* 1.36 1.6 2.0  --     Estimated Creatinine Clearance: 40 mL/min (by C-G formula based on Cr of 1.12).    Allergies  Allergen Reactions  . Tramadol Nausea And Vomiting    Antimicrobials this admission: 6/6 Ceftriaxone >> 6/7 6/7 Cefepime >>  Dose adjustments this admission: -  Microbiology results: 6/6 BCx: ngtd 6/6 UCx: >100K Pseudomonas aeruginosa 6/6 MRSA screen: positive   Thank you for allowing pharmacy to be a part of this patient's care.  Jeremy [name], Jeremy [name] 06/09/2015 5:47 PM

## 2015-06-09 NOTE — Progress Notes (Signed)
PROGRESS NOTE                                                                                                                                                                                                             Patient Demographics:    Jeremy [name], is a 82 y.o. male, DOB - 1925/03/29, [name]:[id number]  Admit date - 06/08/2015   Admitting Physician [name], MD  Outpatient Primary MD for the patient is [name]  LOS - 1  Chief Complaint  Patient presents with  . Emesis       Brief Narrative: Jeremy [name] is a 82 y.o. male with a history of chronic bladder obstruction, recurrent UTI, CAD, dementia. Had foley catheter removed on 6/5 at urologist. Presented to the ED 6/6 withAcute urinary retention and UTI. Patient apparently had a episode of hypotension following administration of IV morphine.    Subjective:    Jeremy [name] today has no pain. Daughter states he is getting better.   Assessment  & Plan :   Systemic inflammatory response syndrome secondary to Complicated UTI: SIRS pathophysiology has resolved, this is likely related to Urinary retention, and indwelling Foley catheter use. Preliminary urine culture shows pseudomonas, we will start cefepime and discontinue ceftriaxone. Await further culture data. Currently he appears stable, nontoxic appearing.   Acute on chronic urinary retention: Foley catheter was just removed on 6/5 by urologist -unfortunately he developed acute urinary retention on 6/6 requiring Foley catheter reinsertion. We will discuss with urology, continue finasteride-given history of orthostatic hypotension requiring Midodrine and Florinef-very hesitant to use Flomax.  Transient hypotension: Suspect secondary to morphine rather than sepsis or SIRS, resolved following IV fluid administration.  History of recurrent syncopal episode and orthostatic hypotension: Continue Florinef and  Midodrine. PT evaluation  History of CAD: Remote history of PCI, currently without any chest pain or shortness of breath. Continue aspirin/Plavix and statin. Not on beta blockers because of orthostatic hypertension.  Chronic diastolic heart failure: Stable/compensated at present-watch closely while on Florinef. No diuretics as history of orthostatic hypotension  Hypothyroidism: stable, continue Levothyroxine  Dyslipidemia: stable, continue simvastatin.   Dementia: Appears stable-during my evaluation he answered all my questions appropriately., continue Seroquel.  Code Status :  Full  Family Communication  :  Daughter at length at bedside and explained that Foley catheter was indicated  on admission.  Disposition Plan  :  Transferred to MedSurg unit, ambulate-await further cultures-suspect home in the next 1-2 days  Consults  :  Urology  Procedures  : none  DVT Prophylaxis  :  Lovenox  Lab Results  Component Value Date   PLT 164 06/09/2015    Inpatient Medications  Scheduled Meds: . aspirin EC  81 mg Oral Daily  . cefTRIAXone (ROCEPHIN)  IV  1 g Intravenous Q24H  . Chlorhexidine Gluconate Cloth  6 each Topical Q0600  . clopidogrel  75 mg Oral Daily  . dorzolamide  1 drop Right Eye BID  . enoxaparin (LOVENOX) injection  40 mg Subcutaneous Q24H  . famotidine  10 mg Oral BID  . finasteride  5 mg Oral QPM  . fludrocortisone  0.05 mg Oral Daily  . latanoprost  1 drop Both Eyes QHS  . levothyroxine  75 mcg Oral QAC breakfast  . midodrine  5 mg Oral TID WC  . mupirocin ointment  1 application Nasal BID  . QUEtiapine  25 mg Oral QHS  . simvastatin  40 mg Oral q1800  . timolol  1 drop Both Eyes BID   Continuous Infusions:  PRN Meds:.morphine injection  Antibiotics  :    Anti-infectives    Start     Dose/Rate Route Frequency Ordered Stop   06/09/15 1100  cefTRIAXone (ROCEPHIN) 1 g in dextrose 5 % 50 mL IVPB     1 g 100 mL/hr over 30 Minutes Intravenous Every 24 hours  06/08/15 1049     06/09/15 0000  cefTRIAXone (ROCEPHIN) 1 g in dextrose 5 % 50 mL IVPB  Status:  Discontinued     1 g 100 mL/hr over 30 Minutes Intravenous Every 24 hours 06/08/15 1254 06/08/15 1343   06/08/15 1045  cefTRIAXone (ROCEPHIN) 2 g in dextrose 5 % 50 mL IVPB     2 g 100 mL/hr over 30 Minutes Intravenous  Once 06/08/15 1038 06/08/15 1147         Objective:   Filed Vitals:   06/09/15 0800 06/09/15 0900 06/09/15 1048 06/09/15 1411  BP: 133/62 144/78 130/71 120/69  Pulse: 75 82 85 75  Temp:   98 F (36.7 C) 97.9 F (36.6 C)  TempSrc:   Oral Oral  Resp: 10 10 16 12   Height:      Weight:      SpO2: 100% 100% 100% 100%    Wt Readings from Last 3 Encounters:  06/08/15 60.9 kg (134 lb 4.2 oz)  06/01/15 60.328 kg (133 lb)  05/04/15 58.514 kg (129 lb)     Intake/Output Summary (Last 24 hours) at 06/09/15 1616 Last data filed at 06/09/15 1300  Gross per 24 hour  Intake 1058.33 ml  Output   1400 ml  Net -341.67 ml     Physical Exam  Awake Alert, Oriented X 3, No new F.N deficits, Normal affect [place].AT,PERRAL Supple Neck,No JVD, No cervical lymphadenopathy appriciated.  Symmetrical Chest wall movement, Good air movement bilaterally, CTAB RRR,No Gallops,Rubs or new Murmurs, No Parasternal Heave +ve B.Sounds, Abd Soft, mild suprapubic tenderness, No organomegaly appriciated, No rebound - guarding or rigidity. No Cyanosis, Clubbing or edema, No new Rash or bruise      Data Review:    CBC  Recent Labs Lab 06/08/15 0900 06/09/15 0309  WBC 9.4 8.5  HGB 13.5 10.1*  HCT 40.6 30.3*  PLT 179 164  MCV 89.2 89.6  MCH 29.7 29.9  MCHC 33.3 33.3  RDW  15.1 15.3  LYMPHSABS 1.0 1.9  MONOABS 1.3* 1.1*  EOSABS 0.1 0.5  BASOSABS 0.0 0.0    Chemistries   Recent Labs Lab 06/08/15 0900 06/09/15 0309  NA 141 141  K 3.6 3.2*  CL 107 111  CO2 25 22  GLUCOSE 97 89  BUN 15 20  CREATININE 0.93 1.12  CALCIUM 9.2 7.8*  AST 27  --   ALT 19  --   ALKPHOS 92  --     BILITOT 0.6  --    ------------------------------------------------------------------------------------------------------------------ No results for input(s): CHOL, HDL, LDLCALC, TRIG, CHOLHDL, LDLDIRECT in the last 72 hours.  Lab Results  Component Value Date   HGBA1C 6.4* 10/13/2014   ------------------------------------------------------------------------------------------------------------------ No results for input(s): TSH, T4TOTAL, T3FREE, THYROIDAB in the last 72 hours.  Invalid input(s): FREET3 ------------------------------------------------------------------------------------------------------------------ No results for input(s): VITAMINB12, FOLATE, FERRITIN, TIBC, IRON, RETICCTPCT in the last 72 hours.  Coagulation profile No results for input(s): INR, PROTIME in the last 168 hours.  No results for input(s): DDIMER in the last 72 hours.  Cardiac Enzymes No results for input(s): CKMB, TROPONINI, MYOGLOBIN in the last 168 hours.  Invalid input(s): CK ------------------------------------------------------------------------------------------------------------------ No results found for: BNP  Micro Results Recent Results (from the past 240 hour(s))  Urine culture     Status: Abnormal (Preliminary result)   Collection Time: 06/08/15  9:11 AM  Result Value Ref Range Status   Specimen Description URINE, RANDOM  Final   Special Requests NONE  Final   Culture >=100,000 COLONIES/mL PSEUDOMONAS AERUGINOSA (A)  Final   Report Status PENDING  Incomplete  Blood Culture (routine x 2)     Status: None (Preliminary result)   Collection Time: 06/08/15 10:20 AM  Result Value Ref Range Status   Specimen Description BLOOD LEFT ANTECUBITAL  Final   Special Requests   Final    BOTTLES DRAWN AEROBIC AND ANAEROBIC 5 CC Performed at [facility]    Culture PENDING  Incomplete   Report Status PENDING  Incomplete  MRSA PCR Screening     Status: Abnormal   Collection Time:  06/08/15  1:51 PM  Result Value Ref Range Status   MRSA by PCR POSITIVE (A) NEGATIVE Final    Comment:        The GeneXpert MRSA Assay (FDA approved for NASAL specimens only), is one component of a comprehensive MRSA colonization surveillance program. It is not intended to diagnose MRSA infection nor to guide or monitor treatment for MRSA infections. RESULT CALLED TO, READ BACK BY AND VERIFIED WITH: Toma Aran [id number] @ 1510 BY J SCOTTON     Radiology Reports No results found.  Time Spent in minutes  30   Debbra Riding PA-S on 06/09/2015 at 4:16 PM   Attending MD note  Patient was seen, examined,treatment plan was discussed with the PA-S.  I have personally reviewed the clinical findings, lab, imaging studies and management of this patient in detail. I agree with the documentation, as recorded by the PA-S.   Patient is doing much better-Foley catheter was reinserted yesterday. BPstable. Urine culture-preliminary positive for Pseudomonas  On Exam: Gen. exam: Awake, alert, not in any distress Chest: Good air entry bilaterally, no rhonchi or rales CVS: S1-S2 regular, no murmurs Abdomen: Soft, nontender and nondistended Neurology: Non-focal Skin: No rash or lesions  Plan Stop Rocephin-changed to cefepime Await urology follow-up Transfer to medsurg Ambulate with PT  Rest as above  [facility]

## 2015-06-10 LAB — CREATININE, SERUM
CREATININE: 0.75 mg/dL (ref 0.61–1.24)
GFR calc Af Amer: >60 mL/min (ref >60)
GFR calc non Af Amer: >60 mL/min (ref >60)

## 2015-06-10 LAB — URINE CULTURE: Culture: >=100000 — AB

## 2015-06-10 MED ORDER — CIPROFLOXACIN HCL 500 MG PO TABS: 500.0000 mg | ORAL_TABLET | Freq: Two times a day (BID) | ORAL | Status: DC

## 2015-06-10 NOTE — Discharge Summary (Signed)
PATIENT DETAILS Name: Jeremy [name] Age: 82 y.o. Sex: male Date of Birth: Apr 23, 1925 MRN: [id number]. Admitting Physician: [name], MD NWG:NFAOZ, Lolita Cram  Admit Date: 06/08/2015 Discharge date: 06/10/2015  Recommendations for Outpatient Follow-up:  1. Follow up with Dr Mena Goes regarding long term management of foley catheter and to see what further work up patient needs for recurrent urinary retention 2. Please repeat CBC/BMET at next visit 3. Please follow blood cultures till final  PRIMARY DISCHARGE DIAGNOSIS:  Active Problems:   UTI (lower urinary tract infection)   SIRS (systemic inflammatory response syndrome) (HCC)      PAST MEDICAL HISTORY: Past Medical History  Diagnosis Date  . Sepsis (HCC)   . UTI (lower urinary tract infection)   . Dementia   . Syncope     4/11 (hospitalized) and 4/13  . CAD (coronary artery disease)   . Hypotension   . Syncope   . Hypothyroidism     s/p Thyroidectomy, partial  . HTN (hypertension)   . Hyperlipidemia     DISCHARGE MEDICATIONS: Current Discharge Medication List    START taking these medications   Details  ciprofloxacin (CIPRO) 500 MG tablet Take 1 tablet (500 mg total) by mouth 2 (two) times daily. Qty: 14 tablet, Refills: 0      CONTINUE these medications which have NOT CHANGED   Details  aspirin EC 81 MG tablet Take 81 mg by mouth daily.    clopidogrel (PLAVIX) 75 MG tablet Take 1 tablet (75 mg total) by mouth daily. Qty: 90 tablet, Refills: 3    CRANBERRY PO Take 1 tablet by mouth daily.    dorzolamide (TRUSOPT) 2 % ophthalmic solution Place 1 drop into the right eye 2 (two) times daily.     finasteride (PROSCAR) 5 MG tablet Take 1 tablet (5 mg total) by mouth daily. Qty: 30 tablet, Refills: 0    fludrocortisone (FLORINEF) 0.1 MG tablet Take 0.5 tablets (0.05 mg total) by mouth daily. Qty: 30 tablet, Refills: 1    latanoprost (XALATAN) 0.005 % ophthalmic solution Place 1 drop into both eyes at  bedtime.    levothyroxine (SYNTHROID, LEVOTHROID) 75 MCG tablet Take 75 mcg by mouth daily. Refills: 0    meloxicam (MOBIC) 15 MG tablet Take 15 mg by mouth daily. Refills: 0    midodrine (PROAMATINE) 5 MG tablet Take 1 tablet (5 mg total) by mouth 3 (three) times daily with meals. Qty: 270 tablet, Refills: 1    OVER THE COUNTER MEDICATION Take 1 tablet by mouth See admin instructions. Drop 1 tablet in water daily and drink- Over the counter [employer]    QUEtiapine (SEROQUEL) 25 MG tablet Take 1 tablet (25 mg total) by mouth at bedtime. For behaviors Qty: 90 tablet, Refills: 3    ranitidine (ZANTAC) 75 MG tablet Take 75 mg by mouth daily. Reported on 06/08/2015    simvastatin (ZOCOR) 40 MG tablet Take 40 mg by mouth daily at 6 PM.     timolol (TIMOPTIC) 0.5 % ophthalmic solution Place 1 drop into both eyes 2 (two) times daily.     Vitamin D, Ergocalciferol, (DRISDOL) 50000 units CAPS capsule Take 50,000 Units by mouth 2 (two) times a week.        ALLERGIES:   Allergies  Allergen Reactions  . Tramadol Nausea And Vomiting    BRIEF HPI:  See H&P, Labs, Consult and Test reports for all details in brief, Jeremy [name] is a 82 y.o. male with a history of  chronic bladder obstruction, recurrent UTI, CAD, dementia. Had foley catheter removed on 6/5 at urologist. Presented to the ED 6/6 withAcute urinary retention and UTI.   CONSULTATIONS:   urology  PERTINENT RADIOLOGIC STUDIES: No results found.   PERTINENT LAB RESULTS: CBC:  Recent Labs  06/08/15 0900 06/09/15 0309  WBC 9.4 8.5  HGB 13.5 10.1*  HCT 40.6 30.3*  PLT 179 164   CMET CMP     Component Value Date/Time   NA 141 06/09/2015 0309   NA 141 11/13/2014   K 3.2* 06/09/2015 0309   CL 111 06/09/2015 0309   CO2 22 06/09/2015 0309   GLUCOSE 89 06/09/2015 0309   BUN 20 06/09/2015 0309   BUN 19 11/13/2014   CREATININE 0.75 06/10/2015 0437   CREATININE 0.9 11/13/2014   CALCIUM 7.8* 06/09/2015 0309   PROT  8.6* 06/08/2015 0900   ALBUMIN 3.9 06/08/2015 0900   AST 27 06/08/2015 0900   ALT 19 06/08/2015 0900   ALKPHOS 92 06/08/2015 0900   BILITOT 0.6 06/08/2015 0900   GFRNONAA >60 06/10/2015 0437   GFRAA >60 06/10/2015 0437    GFR Estimated Creatinine Clearance: 56 mL/min (by C-G formula based on Cr of 0.75).  Recent Labs  06/08/15 0900  LIPASE 24   No results for input(s): CKTOTAL, CKMB, CKMBINDEX, TROPONINI in the last 72 hours. Invalid input(s): POCBNP No results for input(s): DDIMER in the last 72 hours. No results for input(s): HGBA1C in the last 72 hours. No results for input(s): CHOL, HDL, LDLCALC, TRIG, CHOLHDL, LDLDIRECT in the last 72 hours. No results for input(s): TSH, T4TOTAL, T3FREE, THYROIDAB in the last 72 hours.  Invalid input(s): FREET3 No results for input(s): VITAMINB12, FOLATE, FERRITIN, TIBC, IRON, RETICCTPCT in the last 72 hours. Coags: No results for input(s): INR in the last 72 hours.  Invalid input(s): PT Microbiology: Recent Results (from the past 240 hour(s))  Urine culture     Status: Abnormal   Collection Time: 06/08/15  9:11 AM  Result Value Ref Range Status   Specimen Description URINE, RANDOM  Final   Special Requests NONE  Final   Culture >=100,000 COLONIES/mL PSEUDOMONAS AERUGINOSA (A)  Final   Report Status 06/10/2015 FINAL  Final   Organism ID, Bacteria PSEUDOMONAS AERUGINOSA (A)  Final      Susceptibility   Pseudomonas aeruginosa - MIC*    CEFTAZIDIME 4 SENSITIVE Sensitive     CIPROFLOXACIN <=0.25 SENSITIVE Sensitive     GENTAMICIN 2 SENSITIVE Sensitive     IMIPENEM 2 SENSITIVE Sensitive     PIP/TAZO 8 SENSITIVE Sensitive     CEFEPIME 2 SENSITIVE Sensitive     * >=100,000 COLONIES/mL PSEUDOMONAS AERUGINOSA  Blood Culture (routine x 2)     Status: None (Preliminary result)   Collection Time: 06/08/15 10:20 AM  Result Value Ref Range Status   Specimen Description BLOOD LEFT ANTECUBITAL  Final   Special Requests BOTTLES DRAWN AEROBIC  AND ANAEROBIC 5 CC  Final   Culture   Final    NO GROWTH 1 DAY Performed at [facility]    Report Status PENDING  Incomplete  Blood Culture (routine x 2)     Status: None (Preliminary result)   Collection Time: 06/08/15 11:56 AM  Result Value Ref Range Status   Specimen Description BLOOD BLOOD LEFT FOREARM  Final   Special Requests BOTTLES DRAWN AEROBIC ONLY  Final   Culture   Final    NO GROWTH 1 DAY Performed at [facility]    Report Status PENDING  Incomplete  MRSA PCR Screening     Status: Abnormal   Collection Time: 06/08/15  1:51 PM  Result Value Ref Range Status   MRSA by PCR POSITIVE (A) NEGATIVE Final    Comment:        The GeneXpert MRSA Assay (FDA approved for NASAL specimens only), is one component of a comprehensive MRSA colonization surveillance program. It is not intended to diagnose MRSA infection nor to guide or monitor treatment for MRSA infections. RESULT CALLED TO, READ BACK BY AND VERIFIED WITH: Toma Aran [id number] @ 1510 BY J SCOTTON      BRIEF HOSPITAL COURSE:  Systemic inflammatory response syndrome secondary to Complicated UTI: SIRS pathophysiology has resolved, UTI is likely related to Urinary retention, and indwelling Foley catheter use.Urine culture shows pseudomonas (sensitive to Cipro), patient was on IV cefepime, we will transition to Cipro on discharge.    Acute on chronic urinary retention: Foley catheter was just removed on 6/5 by urologist -unfortunately he developed acute urinary retention on 6/6 requiring Foley catheter reinsertion. Spoke with Dr Mena Goes, he suspects patient will likely need a foley catheter indefinitely, we will defer further management of this issue to Urology. Continue finasteride-given history of orthostatic hypotension requiring Midodrine and Florinef-very hesitant to use Flomax.  Transient hypotension: Suspect secondary to morphine rather than sepsis or SIRS, resolved following IV fluid  administration.  History of recurrent syncopal episode and orthostatic hypotension: Continue Florinef and Midodrine. PT evaluation completed, recommendations are for HHPT  History of CAD: Remote history of PCI, currently without any chest pain or shortness of breath. Continue aspirin/Plavix and statin. Not on beta blockers because of orthostatic hypertension.  Chronic diastolic heart failure: Stable/compensated at present-watch closely while on Florinef. No diuretics as history of orthostatic hypotension  Hypothyroidism: stable, continue Levothyroxine  Dyslipidemia: stable, continue simvastatin.   Dementia: Appears stable-during my evaluation he answered all my questions appropriately., continue Seroquel.   TODAY-DAY OF DISCHARGE:  Subjective:   Jeremy [name] today has no headache,no chest abdominal pain,no new weakness tingling or numbness, feels much better wants to go home today.   Objective:   Blood pressure 126/70, pulse 73, temperature 98.1 F (36.7 C), temperature source Oral, resp. rate 18, height 6' (1.829 m), weight 60.9 kg (134 lb 4.2 oz), SpO2 100 %.  Intake/Output Summary (Last 24 hours) at 06/10/15 1023 Last data filed at 06/10/15 0900  Gross per 24 hour  Intake    560 ml  Output   2150 ml  Net  -1590 ml   Filed Weights   06/08/15 1330  Weight: 60.9 kg (134 lb 4.2 oz)    Exam Awake Alert, Oriented *3, No new F.N deficits, Normal affect [place].AT,PERRAL Supple Neck,No JVD, No cervical lymphadenopathy appriciated.  Symmetrical Chest wall movement, Good air movement bilaterally, CTAB RRR,No Gallops,Rubs or new Murmurs, No Parasternal Heave +ve B.Sounds, Abd Soft, Non tender, No organomegaly appriciated, No rebound -guarding or rigidity. No Cyanosis, Clubbing or edema, No new Rash or bruise  DISCHARGE CONDITION: Stable  DISPOSITION: Home with home health services  DISCHARGE INSTRUCTIONS:    Activity:  As tolerated with Full fall precautions use walker/cane  & assistance as needed  Get Medicines reviewed and adjusted: Please take all your medications with you for your next visit with your Primary MD  Please request your Primary MD to go over all hospital tests and procedure/radiological results at the follow up, please ask your Primary MD to get all  Hospital records sent to his/her office.  If you experience worsening of your admission symptoms, develop shortness of breath, life threatening emergency, suicidal or homicidal thoughts you must seek medical attention immediately by calling 911 or calling your MD immediately  if symptoms less severe.  You must read complete instructions/literature along with all the possible adverse reactions/side effects for all the Medicines you take and that have been prescribed to you. Take any new Medicines after you have completely understood and accpet all the possible adverse reactions/side effects.   Do not drive when taking Pain medications.   Do not take more than prescribed Pain, Sleep and Anxiety Medications  Special Instructions: If you have smoked or chewed Tobacco  in the last 2 yrs please stop smoking, stop any regular Alcohol  and or any Recreational drug use.  Wear Seat belts while driving.  Please note  You were cared for by a hospitalist during your hospital stay. Once you are discharged, your primary care physician will handle any further medical issues. Please note that NO REFILLS for any discharge medications will be authorized once you are discharged, as it is imperative that you return to your primary care physician (or establish a relationship with a primary care physician if you do not have one) for your aftercare needs so that they can reassess your need for medications and monitor your lab values.   Diet recommendation: Regular Diet   Discharge Instructions    Call MD for:  persistant nausea and vomiting    Complete by:  As directed      Call MD for:  severe uncontrolled pain     Complete by:  As directed      Call MD for:  temperature >100.4    Complete by:  As directed      Diet general    Complete by:  As directed      Increase activity slowly    Complete by:  As directed            Follow-up Information    Follow up with Jeremy [name], [name] Schedule an appointment as soon as possible for a visit in 2 weeks.   Specialty:  General Practice   Why:  Hospital follow up   Contact information:   [address] [place] [address] [phone]       [facility]. Schedule an appointment as soon as possible for a visit in 2 weeks.   Specialty:  Urology   Why:  Hospital follow up   Contact information:   [address] [place] [address] [phone]      Total Time spent on discharge equals  45 minutes.  SignedJeoffrey Massed: Leilyn Frayre 06/10/2015 10:23 AM

## 2015-06-10 NOTE — Evaluation (Signed)
Physical Therapy Evaluation Patient Details Name: Jeremy [name] MRN: [id number] DOB: 01-01-1926 Today's Date: 06/10/2015   History of Present Illness  82 yo male admitted with UTI, SIRS. Hx of recurrent UTI, , CAD, dementia, syncope, HTN  Clinical Impression  On eval, pt required Min assist for mobility-walked ~175 feet with RW. Pt tolerated distance well. Recommend pt resume HHPT services after discharge. Family present-report pt has 24 hour care.     Follow Up Recommendations Home health PT;Supervision/Assistance - 24 hour    Equipment Recommendations  None recommended by PT    Recommendations for Other Services       Precautions / Restrictions Precautions Precautions: Fall Restrictions Weight Bearing Restrictions: No      Mobility  Bed Mobility Overal bed mobility: Needs Assistance Bed Mobility: Supine to Sit     Supine to sit: Min guard     General bed mobility comments: close guard for safety. Multimodal cueing required. Increased time.   Transfers Overall transfer level: Needs assistance Equipment used: Rolling walker (2 wheeled) Transfers: Sit to/from Stand Sit to Stand: Min assist         General transfer comment: Assist to rise, stabilize, control descent. Multimodal cueing required.   Ambulation/Gait Ambulation/Gait assistance: Min assist Ambulation Distance (Feet): 175 Feet Assistive device: Rolling walker (2 wheeled) Gait Pattern/deviations: Step-through pattern;Decreased stride length     General Gait Details: Assist to stabilize and maneuver with walker. Pt tolerated distance well.   Stairs            Wheelchair Mobility    Modified Rankin (Stroke Patients Only)       Balance Overall balance assessment: Needs assistance         Standing balance support: Bilateral upper extremity supported;During functional activity Standing balance-Leahy Scale: Poor Standing balance comment: requires walker                              Pertinent Vitals/Pain Pain Assessment: No/denies pain    Home Living Family/patient expects to be discharged to:: Private residence Living Arrangements: Spouse/significant other Available Help at Discharge: Family;Available 24 hours/day   Home Access: Ramped entrance     Home Layout: Multi-level;Able to live on main level with bedroom/bathroom Home Equipment: Gilford Rile - 2 wheels;Cane - single point;Shower seat;Bedside commode;Wheelchair - manual Additional Comments: decreased vision    Prior Function Level of Independence: Needs assistance   Gait / Transfers Assistance Needed: assist with transfers/mobility; limited ambulation due to spinal stenosis, uses w/c when going out  ADL's / Homemaking Assistance Needed: assist with dressing, bathing, feeding, shower transfer  Comments: Pt lives with wife who has dementia and cannot assist him. Family provides 24 hour care.     Hand Dominance        Extremity/Trunk Assessment   Upper Extremity Assessment: Generalized weakness           Lower Extremity Assessment: Generalized weakness      Cervical / Trunk Assessment: Normal  Communication   Communication: No difficulties  Cognition Arousal/Alertness: Awake/alert Behavior During Therapy: WFL for tasks assessed/performed Overall Cognitive Status: History of cognitive impairments - at baseline                      General Comments      Exercises        Assessment/Plan    PT Assessment All further PT needs can be met in the next venue of care (  HHPT)  PT Diagnosis Difficulty walking;Generalized weakness   PT Problem List Decreased strength;Decreased activity tolerance;Decreased balance;Decreased mobility;Decreased cognition  PT Treatment Interventions     PT Goals (Current goals can be found in the Care Plan section) Acute Rehab PT Goals Patient Stated Goal: home PT Goal Formulation: All assessment and education complete, DC therapy    Frequency      Barriers to discharge        Co-evaluation               End of Session Equipment Utilized During Treatment: Gait belt Activity Tolerance: Patient tolerated treatment well Patient left: in chair;with call bell/phone within reach;with chair alarm set;with family/visitor present           Time: 0945-1006 PT Time Calculation (min) (ACUTE ONLY): 21 min   Charges:   PT Evaluation $PT Eval Low Complexity: 1 Procedure     PT G Codes:        Weston Anna, MPT Pager: [phone]

## 2015-06-10 NOTE — Progress Notes (Signed)
Patient alert and oriented with family at bedside. Patient and family given discharge instructions and prescriptions. All questions and concerns answered. Patient and family verbalized understanding of discharge instructions.

## 2015-06-10 NOTE — Progress Notes (Addendum)
  The patient admitted for low blood pressure and does have Pseudomonas  On urine culture.  It is unclear if he has a true UTI versus asymptomatic bacteriuria from recent Foley.  Nonetheless I do agree with antibiotics to take the culture out of the equation.  The patient's daughter reports he was voiding 300-500 cc a time before he was admitted and she heard his stream and it sounded good.  Also she gave him the night before admission and she did not note a distended bladder.  She reports he was in and out catheter in the emergency department and then a Foley was placed.  Per ED notes bladder scan was greater than 1000 cc.     Patient currently is doing well. He is sitting up in a chair and eating lunch. Foley in place making good urine.  Urine is clear.  He looks quite well.  I checked the penis and there is no paraphimosis or swelling (daughter was concerned about penile swelling).  There are some mild coronal hypospadias from the Foley catheter.   His kidney function and labs are normal.   Prior GU history: #1 BPH-patient has a history of a TURP in 1999 at the bladder neck contracture treated in 2003.  He had urinary retention in 2007.  He was on tamsulosin at one point.  In October 2016 there was a distended bladder on the CT of the hip, his prostate appeared normal.  There was severe spinal stenosis.  November 2016 showed a post void of 438 and a Foley catheter was placed.  Finasteride was added.  I had seen him February 2017 and recommended voiding trial after a few months of further recovery and rehabilitation.  Foley catheter was removed June 07, 2015 in the office.  Interval history as above.  A #2 elevated PSA-patient with a PSA of 3.91 and September 2009.  By September 2015 PSA was 5.5 with a normal DRE.  PSA was up to 7.56 July 2016 and finasteride was added.  His January 2017 PSA was 3.08.

## 2015-06-13 LAB — CULTURE, BLOOD (ROUTINE X 2)
Culture: NO GROWTH
Culture: NO GROWTH

## 2015-06-27 ENCOUNTER — Encounter (HOSPITAL_COMMUNITY): Payer: Self-pay

## 2015-06-27 ENCOUNTER — Emergency Department (HOSPITAL_COMMUNITY)
Admission: EM | Admit: 2015-06-27 | Discharge: 2015-06-27 | Disposition: A | Discharge status: Home / Self Care | Payer: Medicare Other | Attending: Emergency Medicine | Admitting: Emergency Medicine

## 2015-06-27 ENCOUNTER — Emergency Department (HOSPITAL_COMMUNITY): Payer: Medicare Other

## 2015-06-27 DIAGNOSIS — Z87891 Personal history of nicotine dependence: Secondary | ICD-10-CM | POA: Insufficient documentation

## 2015-06-27 DIAGNOSIS — I2510 Atherosclerotic heart disease of native coronary artery without angina pectoris: Secondary | ICD-10-CM | POA: Diagnosis not present

## 2015-06-27 DIAGNOSIS — W0110XA Fall on same level from slipping, tripping and stumbling with subsequent striking against unspecified object, initial encounter: Secondary | ICD-10-CM | POA: Insufficient documentation

## 2015-06-27 DIAGNOSIS — Z7982 Long term (current) use of aspirin: Secondary | ICD-10-CM | POA: Insufficient documentation

## 2015-06-27 DIAGNOSIS — Y999 Unspecified external cause status: Secondary | ICD-10-CM | POA: Insufficient documentation

## 2015-06-27 DIAGNOSIS — Y92009 Unspecified place in unspecified non-institutional (private) residence as the place of occurrence of the external cause: Secondary | ICD-10-CM | POA: Diagnosis not present

## 2015-06-27 DIAGNOSIS — W19XXXA Unspecified fall, initial encounter: Secondary | ICD-10-CM

## 2015-06-27 DIAGNOSIS — S0101XA Laceration without foreign body of scalp, initial encounter: Secondary | ICD-10-CM

## 2015-06-27 DIAGNOSIS — Z7902 Long term (current) use of antithrombotics/antiplatelets: Secondary | ICD-10-CM | POA: Diagnosis not present

## 2015-06-27 DIAGNOSIS — S0181XA Laceration without foreign body of other part of head, initial encounter: Secondary | ICD-10-CM | POA: Diagnosis present

## 2015-06-27 DIAGNOSIS — S0990XA Unspecified injury of head, initial encounter: Secondary | ICD-10-CM | POA: Insufficient documentation

## 2015-06-27 DIAGNOSIS — Y939 Activity, unspecified: Secondary | ICD-10-CM | POA: Insufficient documentation

## 2015-06-27 DIAGNOSIS — S01111A Laceration without foreign body of right eyelid and periocular area, initial encounter: Secondary | ICD-10-CM | POA: Insufficient documentation

## 2015-06-27 MED ORDER — LIDOCAINE-EPINEPHRINE 1 %-1:100000 IJ SOLN
10.0000 mL | Freq: Once | INTRAMUSCULAR | Status: AC
  Administered 2015-06-27: 1 mL
  Filled 2015-06-27: qty 1

## 2015-06-27 NOTE — ED Provider Notes (Signed)
CSN: [id number]     Arrival date & time    History   First MD Initiated Contact with Patient 06/27/15 1605     Chief Complaint  Patient presents with  . Fall    on plavix     (Consider location/radiation/quality/duration/timing/severity/associated sxs/prior Treatment) HPI   89yM presenting after fall. Happened just before coming to ED. Patient denies any LOC. Patient is on plavix. Laceration on his forehead, bleeding controlled. He has no complaints. Family at bedside and report that he is at his baseline mental status.    Past Medical History  Diagnosis Date  . Sepsis (HCC)   . UTI (lower urinary tract infection)   . Dementia   . Syncope     4/11 (hospitalized) and 4/13  . CAD (coronary artery disease)   . Hypotension   . Syncope   . Hypothyroidism     s/p Thyroidectomy, partial  . HTN (hypertension)   . Hyperlipidemia    Past Surgical History  Procedure Laterality Date  . Coronary angioplasty with stent placement    . Thyroidectomy, partial    . Renal doppler  08/17/2005    Normal evaluation  . Cardiac catheterization  02/25/2002    Proximal L Circumflex 95% lesion, stented w/ 3x18-mm CYPHER stent avoiding the 2 L Marginal branch, jailing the 1st marginal, resulting in reduction of a 90-95% lesion to 0% residual  . Cardiovascular stress test  12/18/2006    EKG negative for ischemia, no significant ischemia demonstrated.  . Transthoracic echocardiogram  03/13/2002    EF >55%, moderate LVH,   . Back surgery     Family History  Problem Relation Age of Onset  . Family history unknown: Yes   Social History  Substance Use Topics  . Smoking status: Former Games developermoker  . Smokeless tobacco: Never Used     Comment: Quit 50+ years ago.  . Alcohol Use: No    Review of Systems  All systems reviewed and negative, other than as noted in HPI.   Allergies  Tramadol  Home Medications   Prior to Admission medications   Medication Sig Start Date End Date Taking? Authorizing  Provider  aspirin EC 81 MG tablet Take 81 mg by mouth every morning.    Yes Historical Provider, MD  clopidogrel (PLAVIX) 75 MG tablet Take 1 tablet (75 mg total) by mouth daily. Patient taking differently: Take 75 mg by mouth every morning.  10/30/14  Yes Runell GessJonathan J Berry, MD  CRANBERRY PO Take 1 tablet by mouth every evening.    Yes Historical Provider, MD  dorzolamide (TRUSOPT) 2 % ophthalmic solution Place 1 drop into the right eye 2 (two) times daily.  08/23/12  Yes Historical Provider, MD  finasteride (PROSCAR) 5 MG tablet Take 1 tablet (5 mg total) by mouth daily. Patient taking differently: Take 5 mg by mouth every evening.  10/13/14  Yes Albertine GratesFang Xu, MD  fludrocortisone (FLORINEF) 0.1 MG tablet Take 0.5 tablets (0.05 mg total) by mouth daily. Patient taking differently: Take 0.05 mg by mouth every morning.  04/23/15  Yes Costin Otelia SergeantM Gherghe, MD  latanoprost (XALATAN) 0.005 % ophthalmic solution Place 1 drop into both eyes at bedtime.   Yes Historical Provider, MD  levothyroxine (SYNTHROID, LEVOTHROID) 75 MCG tablet Take 75 mcg by mouth daily before breakfast.  11/06/14  Yes Historical Provider, MD  meloxicam (MOBIC) 15 MG tablet Take 15 mg by mouth every morning.  02/17/15  Yes Historical Provider, MD  midodrine (PROAMATINE) 5 MG tablet  Take 1 tablet (5 mg total) by mouth 3 (three) times daily with meals. 05/04/15  Yes Dwana Melena, PA-C  OVER THE COUNTER MEDICATION Take 1 tablet by mouth every Monday, Wednesday, and Friday. With lunch.  Drop 1 tablet in water daily and drink- Over the counter [employer]   Yes Historical Provider, MD  QUEtiapine (SEROQUEL) 25 MG tablet Take 1 tablet (25 mg total) by mouth at bedtime. For behaviors 02/25/15  Yes Levert Feinstein, MD  ranitidine (ZANTAC) 75 MG tablet Take 75 mg by mouth every morning. Reported on 06/08/2015   Yes Historical Provider, MD  simvastatin (ZOCOR) 40 MG tablet Take 40 mg by mouth daily at 6 PM.    Yes Historical Provider, MD  timolol (TIMOPTIC) 0.5 %  ophthalmic solution Place 1 drop into both eyes 2 (two) times daily.  09/18/12  Yes Historical Provider, MD  Vitamin D, Ergocalciferol, (DRISDOL) 50000 units CAPS capsule Take 50,000 Units by mouth 2 (two) times a week. Monday and Thursday in the evening   Yes Historical Provider, MD   BP 172/102 mmHg  Pulse 85  Resp 14  SpO2 100% Physical Exam  Constitutional: He appears well-developed and well-nourished. No distress.  HENT:  Head: Normocephalic and atraumatic.    2cm laceration in pictured area. Small arteriole pumping, but controllable with pressure. No apparent bony tenderness. Eye otherwise fine.   Eyes: Conjunctivae are normal. Right eye exhibits no discharge. Left eye exhibits no discharge.  Neck: Neck supple.  Cardiovascular: Normal rate, regular rhythm and normal heart sounds.  Exam reveals no gallop and no friction rub.   No murmur heard. Pulmonary/Chest: Effort normal and breath sounds normal. No respiratory distress.  Abdominal: Soft. He exhibits no distension. There is no tenderness.  Musculoskeletal: He exhibits no edema or tenderness.  No midline spinal tenderness  Neurological: He is alert. No cranial nerve deficit. He exhibits normal muscle tone. Coordination normal.  Skin: Skin is warm and dry.  Psychiatric: He has a normal mood and affect. His behavior is normal. Thought content normal.  Nursing note and vitals reviewed.   ED Course  Procedures (including critical care time)  LACERATION REPAIR Performed by: Raeford Razor Authorized by: Raeford Razor Consent: Verbal consent obtained. Risks and benefits: risks, benefits and alternatives were discussed Consent given by: patient Patient identity confirmed: provided demographic data Prepped and Draped in normal sterile fashion Wound explored  Small pumping ateriole ligated with figure of eight stitch with good hemostasis  Laceration Location: R eyebrow  Laceration Length: 2cm  No Foreign Bodies seen or  palpated  Anesthesia: local infiltration  Local anesthetic: lidocaine 1% w epinephrine  Anesthetic total: 2 ml  Irrigation method: syringe Amount of cleaning: standard  Skin closure: 5-0 vicryl rapide  Number of sutures: 5  Technique: simple interupted  Patient tolerance: Patient tolerated the procedure well with no immediate complications.  Labs Review Labs Reviewed - No data to display  Imaging Review Ct Head Wo Contrast  06/27/2015  CLINICAL DATA:  Patient tripped over a Foley catheter and fell, hitting the right side of the head. Laceration with bleeding. Initial encounter. EXAM: CT HEAD WITHOUT CONTRAST TECHNIQUE: Contiguous axial images were obtained from the base of the skull through the vertex without intravenous contrast. COMPARISON:  04/22/2015 head CTA FINDINGS: Moderate cerebral atrophy and moderate chronic small vessel ischemic disease are unchanged. There is no evidence of acute cortical infarct, intracranial hemorrhage, mass, midline shift, or extra-axial fluid collection. Postoperative changes to both globes. Right  frontotemporal scalp swelling/small hematoma. No skull fracture identified. Mild mucosal thickening and small volume secretions partially visualized in the right maxillary sinus with osteitis consistent with chronic sinusitis. Mastoid air cells are clear. IMPRESSION: 1. No evidence of acute intracranial abnormality. 2. Right-sided scalp hematoma. 3. Moderate chronic small vessel ischemic disease and cerebral atrophy. Electronically Signed   By: Sebastian AcheAllen  Grady M.D.   On: 06/27/2015 17:17   I have personally reviewed and evaluated these images and lab results as part of my medical decision-making.   EKG Interpretation None      MDM   Final diagnoses:  Fall, initial encounter  Scalp laceration, initial encounter    Forehead laceration repaired. Family at bedside. They report patient is at his baseline. CT head negative.     Raeford RazorStephen Drayton Tieu, MD 07/13/15  [phone]

## 2015-06-27 NOTE — Discharge Instructions (Signed)

## 2015-06-27 NOTE — ED Notes (Signed)
Patient had a fall at home.  Patient denies any LOC. Patient is on plavix. Skin tear on his forehead, bleeding controlled.  Patient orientated to self not time.  This is baseline for him

## 2015-06-27 NOTE — ED Notes (Signed)
Pt out of room for testing. 

## 2015-08-06 ENCOUNTER — Ambulatory Visit (INDEPENDENT_AMBULATORY_CARE_PROVIDER_SITE_OTHER): Payer: Medicare Other | Admitting: Cardiovascular Disease

## 2015-08-06 ENCOUNTER — Encounter: Payer: Self-pay | Admitting: Cardiovascular Disease

## 2015-08-06 DIAGNOSIS — E785 Hyperlipidemia, unspecified: Secondary | ICD-10-CM | POA: Diagnosis not present

## 2015-08-06 DIAGNOSIS — I10 Essential (primary) hypertension: Secondary | ICD-10-CM

## 2015-08-06 DIAGNOSIS — I2510 Atherosclerotic heart disease of native coronary artery without angina pectoris: Secondary | ICD-10-CM

## 2015-08-06 NOTE — Assessment & Plan Note (Signed)
History of CAD status post non-ST segment elevation myocardial infarction February 2000 for tree with stenting of the circumflex coronary artery with a Cypher drug-eluting stent. At that time he did have a 50% distal RCA stenosis and normal LV function. He currently denies chest pain or shortness of breath.

## 2015-08-06 NOTE — Assessment & Plan Note (Signed)
History of hypertension in the past on losartan and metoprolol which has since been discontinued because of symptomatic hypotension and associated syncope. He is currently on Florinef with blood pressure of 148/90.

## 2015-08-06 NOTE — Patient Instructions (Signed)

## 2015-08-06 NOTE — Assessment & Plan Note (Signed)
History of hyperlipidemia currently on statin therapy with lipid profile performed 12/01/14 revealing total cholesterol 123, LDL 60 and HDL of 44

## 2015-08-06 NOTE — Progress Notes (Signed)
08/06/2015 BORYS MAROLDA   September 13, 1925  [id number]  Primary Physician [name] Primary Cardiologist: Runell Gess MD [name]  HPI:  The patient is a very pleasant 82 year old, thin-appearing, married [place] American male, father of 4, grandfather to 4 grandchildren who I last saw 10/20/14. He has a history of CAD status post non-ST-segment-elevation myocardial infarction February 2004 treated with stenting of a circumflex using a Cypher drug-eluting stent. At that time, he had a 50% distal RCA lesion and normal LV function. He also had a 40% right renal artery stenosis. He denies chest pain or shortness of breath. Dr. Renae Gloss has been following his lipid profile. He was hospitalized a year ago with abdominal pain, was found to have a nonbleeding duodenal ulcer and a small hiatal hernia. His Aleve was stopped.  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 He also complains of bilateral calf claudication.I did perform a lower extremity arterial Doppler studies which were normal. Since I saw him a year ago he's remained clinically stable denying chest pain or shortness of breath. He has had urinary retention and bladder outlet obstruction currently with indwelling Foley catheter. His last 20+ pounds and has had symptomatic hypotension with syncope requiring discontinuation of his antihypertensive medications and beginning Florinef.    Current Outpatient Prescriptions  Medication Sig Dispense Refill  . aspirin EC 81 MG tablet Take 81 mg by mouth every morning.     . clopidogrel (PLAVIX) 75 MG tablet Take 75 mg by mouth daily.    Marland Kitchen CRANBERRY PO Take 1 tablet by mouth every evening.     . dorzolamide (TRUSOPT) 2 % ophthalmic solution Place 1 drop into the right eye 2 (two) times daily.     . finasteride (PROSCAR) 5 MG tablet Take 5 mg by mouth daily.    . fludrocortisone (FLORINEF) 0.1 MG tablet Take 0.5 tablets (0.05 mg total) by mouth daily.  (Patient taking differently: Take 0.05 mg by mouth every morning. ) 30 tablet 1  . latanoprost (XALATAN) 0.005 % ophthalmic solution Place 1 drop into both eyes at bedtime.    Marland Kitchen levothyroxine (SYNTHROID, LEVOTHROID) 75 MCG tablet Take 75 mcg by mouth daily before breakfast.   0  . meloxicam (MOBIC) 15 MG tablet Take 15 mg by mouth every morning.   0  . midodrine (PROAMATINE) 5 MG tablet Take 1 tablet (5 mg total) by mouth 3 (three) times daily with meals. 270 tablet 1  . OVER THE COUNTER MEDICATION Take 1 tablet by mouth every Monday, Wednesday, and Friday. With lunch.  Drop 1 tablet in water daily and drink- Over the counter [employer]    . QUEtiapine (SEROQUEL) 25 MG tablet Take 1 tablet (25 mg total) by mouth at bedtime. For behaviors 90 tablet 3  . ranitidine (ZANTAC) 75 MG tablet Take 75 mg by mouth every morning. Reported on 06/08/2015    . simvastatin (ZOCOR) 40 MG tablet Take 40 mg by mouth daily at 6 PM.     . timolol (TIMOPTIC) 0.5 % ophthalmic solution Place 1 drop into both eyes 2 (two) times daily.     . Vitamin D, Ergocalciferol, (DRISDOL) 50000 units CAPS capsule Take 50,000 Units by mouth 2 (two) times a week. Monday and Thursday in the evening     No current facility-administered medications for this visit.     Allergies  Allergen Reactions  . Tramadol Nausea And Vomiting    Social History  Social History  . Marital status: Married    Spouse name: N/A  . Number of children: 4  . Years of education: HS   Occupational History  . Retired    Social History Main Topics  . Smoking status: Former Games developer  . Smokeless tobacco: Never Used     Comment: Quit 50+ years ago.  . Alcohol use No  . Drug use: No  . Sexual activity: Not on file   Other Topics Concern  . Not on file   Social History Narrative   Lives with his wife.   Right-handed.   No caffeine use.     Review of Systems: General: negative for chills, fever, night sweats or weight changes.    Cardiovascular: negative for chest pain, dyspnea on exertion, edema, orthopnea, palpitations, paroxysmal nocturnal dyspnea or shortness of breath Dermatological: negative for rash Respiratory: negative for cough or wheezing Urologic: negative for hematuria Abdominal: negative for nausea, vomiting, diarrhea, bright red blood per rectum, melena, or hematemesis Neurologic: negative for visual changes, syncope, or dizziness All other systems reviewed and are otherwise negative except as noted above.    Blood pressure (!) 148/90, pulse 78, height 6' (1.829 m), weight 137 lb 9.6 oz (62.4 kg).  General appearance: alert and no distress Neck: no adenopathy, no carotid bruit, no JVD, supple, symmetrical, trachea midline and thyroid not enlarged, symmetric, no tenderness/mass/nodules Lungs: clear to auscultation bilaterally Heart: regular rate and rhythm, S1, S2 normal, no murmur, click, rub or gallop Extremities: extremities normal, atraumatic, no cyanosis or edema  EKG not performed today  ASSESSMENT AND PLAN:   Essential hypertension History of hypertension in the past on losartan and metoprolol which has since been discontinued because of symptomatic hypotension and associated syncope. He is currently on Florinef with blood pressure of 148/90.  Dyslipidemia History of hyperlipidemia currently on statin therapy with lipid profile performed 12/01/14 revealing total cholesterol 123, LDL 60 and HDL of 44  Coronary artery disease involving native coronary artery of native heart without angina pectoris History of CAD status post non-ST segment elevation myocardial infarction February 2000 for tree with stenting of the circumflex coronary artery with a Cypher drug-eluting stent. At that time he did have a 50% distal RCA stenosis and normal LV function. He currently denies chest pain or shortness of breath.      Runell Gess MD FACP,FACC,FAHA, [facility] 08/06/2015 10:43 AM

## 2015-08-07 ENCOUNTER — Encounter (HOSPITAL_COMMUNITY): Payer: Self-pay | Admitting: *Deleted

## 2015-08-07 ENCOUNTER — Emergency Department (HOSPITAL_COMMUNITY): Payer: Medicare Other

## 2015-08-07 ENCOUNTER — Emergency Department (HOSPITAL_COMMUNITY)
Admission: EM | Admit: 2015-08-07 | Discharge: 2015-08-07 | Disposition: A | Discharge status: Home / Self Care | Payer: Medicare Other | Attending: Physician Assistant | Admitting: Physician Assistant

## 2015-08-07 DIAGNOSIS — I129 Hypertensive chronic kidney disease with stage 1 through stage 4 chronic kidney disease, or unspecified chronic kidney disease: Secondary | ICD-10-CM | POA: Insufficient documentation

## 2015-08-07 DIAGNOSIS — F0390 Unspecified dementia without behavioral disturbance: Secondary | ICD-10-CM | POA: Diagnosis not present

## 2015-08-07 DIAGNOSIS — I2510 Atherosclerotic heart disease of native coronary artery without angina pectoris: Secondary | ICD-10-CM | POA: Diagnosis not present

## 2015-08-07 DIAGNOSIS — Z79899 Other long term (current) drug therapy: Secondary | ICD-10-CM | POA: Diagnosis not present

## 2015-08-07 DIAGNOSIS — Z7982 Long term (current) use of aspirin: Secondary | ICD-10-CM | POA: Diagnosis not present

## 2015-08-07 DIAGNOSIS — Z87891 Personal history of nicotine dependence: Secondary | ICD-10-CM | POA: Insufficient documentation

## 2015-08-07 DIAGNOSIS — N390 Urinary tract infection, site not specified: Secondary | ICD-10-CM

## 2015-08-07 DIAGNOSIS — R319 Hematuria, unspecified: Secondary | ICD-10-CM | POA: Insufficient documentation

## 2015-08-07 DIAGNOSIS — N183 Chronic kidney disease, stage 3 (moderate): Secondary | ICD-10-CM | POA: Diagnosis not present

## 2015-08-07 DIAGNOSIS — E039 Hypothyroidism, unspecified: Secondary | ICD-10-CM | POA: Diagnosis not present

## 2015-08-07 LAB — COMPREHENSIVE METABOLIC PANEL
ALBUMIN: 4.2 g/dL (ref 3.5–5.0)
ALT: 13 U/L — ABNORMAL LOW (ref 17–63)
ANION GAP: 9 (ref 5–15)
AST: 25 U/L (ref 15–41)
Alkaline Phosphatase: 85 U/L (ref 38–126)
BILIRUBIN TOTAL: 0.8 mg/dL (ref 0.3–1.2)
BUN: 16 mg/dL (ref 6–20)
CALCIUM: 9.3 mg/dL (ref 8.9–10.3)
CO2: 23 mmol/L (ref 22–32)
Chloride: 108 mmol/L (ref 101–111)
Creatinine, Ser: 0.88 mg/dL (ref 0.61–1.24)
GFR calc non Af Amer: >60 mL/min (ref >60)
GLUCOSE: 111 mg/dL — AB (ref 65–99)
POTASSIUM: 3.1 mmol/L — AB (ref 3.5–5.1)
SODIUM: 140 mmol/L (ref 135–145)
TOTAL PROTEIN: 9.3 g/dL — AB (ref 6.5–8.1)

## 2015-08-07 LAB — CBC WITH DIFFERENTIAL/PLATELET
BASOS ABS: 0 10*3/uL (ref 0.0–0.1)
BASOS PCT: 0 %
Eosinophils Absolute: 0 10*3/uL (ref 0.0–0.7)
Eosinophils Relative: 0 %
HEMATOCRIT: 42.6 % (ref 39.0–52.0)
HEMOGLOBIN: 14.2 g/dL (ref 13.0–17.0)
LYMPHS PCT: 6 %
Lymphs Abs: 0.8 10*3/uL (ref 0.7–4.0)
MCH: 29 pg (ref 26.0–34.0)
MCHC: 33.3 g/dL (ref 30.0–36.0)
MCV: 86.9 fL (ref 78.0–100.0)
MONOS PCT: 12 %
Monocytes Absolute: 1.5 10*3/uL — ABNORMAL HIGH (ref 0.1–1.0)
NEUTROS ABS: 9.7 10*3/uL — AB (ref 1.7–7.7)
NEUTROS PCT: 82 %
Platelets: 187 10*3/uL (ref 150–400)
RBC: 4.9 MIL/uL (ref 4.22–5.81)
RDW: 14.2 % (ref 11.5–15.5)
WBC: 12 10*3/uL — ABNORMAL HIGH (ref 4.0–10.5)

## 2015-08-07 LAB — URINALYSIS, ROUTINE W REFLEX MICROSCOPIC
Bilirubin Urine: NEGATIVE
Glucose, UA: NEGATIVE mg/dL
Ketones, ur: NEGATIVE mg/dL
Nitrite: POSITIVE — AB
Protein, ur: 100 mg/dL — AB
Specific Gravity, Urine: 1.009 (ref 1.005–1.030)
pH: 7 (ref 5.0–8.0)

## 2015-08-07 LAB — URINE MICROSCOPIC-ADD ON: Squamous Epithelial / LPF: NONE SEEN

## 2015-08-07 LAB — I-STAT TROPONIN, ED: TROPONIN I, POC: 0.02 ng/mL (ref 0.00–0.08)

## 2015-08-07 MED ORDER — CEPHALEXIN 500 MG PO CAPS: 500.0000 mg | ORAL_CAPSULE | Freq: Four times a day (QID) | ORAL | 0 refills | Status: AC

## 2015-08-07 MED ORDER — SODIUM CHLORIDE 0.9 % IV BOLUS (SEPSIS)
1000.0000 mL | Freq: Once | INTRAVENOUS | Status: AC
  Administered 2015-08-07: 1000 mL via INTRAVENOUS

## 2015-08-07 MED ORDER — POTASSIUM CHLORIDE 10 MEQ/100ML IV SOLN
10.0000 meq | Freq: Once | INTRAVENOUS | Status: AC
  Administered 2015-08-07: 10 meq via INTRAVENOUS
  Filled 2015-08-07: qty 100

## 2015-08-07 MED ORDER — CEPHALEXIN 500 MG PO CAPS
500.0000 mg | ORAL_CAPSULE | Freq: Once | ORAL | Status: AC
  Administered 2015-08-07: 500 mg via ORAL
  Filled 2015-08-07: qty 1

## 2015-08-07 NOTE — ED Notes (Signed)
Bed: WA06 Expected date:  Expected time:  Means of arrival:  Comments: 82 yo blood in catheter

## 2015-08-07 NOTE — Discharge Instructions (Signed)
Please follow up with your regular physician. Please return if nausea or anything precludes you from takign your antibiotics.

## 2015-08-07 NOTE — ED Provider Notes (Signed)
[facility]-EMERGENCY DEPT Provider Note   CSN: [id number] Arrival date & time: 08/07/15  1013  First Provider Contact:  None       History   Chief Complaint Chief Complaint  Patient presents with  . Fever  . Hematuria    HPI Jeremy [name] is a 82 y.o. male.  HPI   82 year old with PMH sig for CAD, dementia, HTN, hypotension, frequent UTI with chronic indwelling catheter here with hematuria since yesterday. Cloudy urine X 1 week. Blood starting yesterday.  Pt increasing confusion at home. Sleepier than usual.  Vomiting X 1. Temperature of 100.1.     Past Medical History:  Diagnosis Date  . CAD (coronary artery disease)   . Dementia   . HTN (hypertension)   . Hyperlipidemia   . Hypotension   . Hypothyroidism    s/p Thyroidectomy, partial  . Sepsis (HCC)   . Syncope    4/11 (hospitalized) and 4/13  . Syncope   . UTI (lower urinary tract infection)     Patient Active Problem List   Diagnosis Date Noted  . SIRS (systemic inflammatory response syndrome) (HCC) 06/08/2015  . Orthostatic hypotension 06/01/2015  . Confusion   . Near syncope   . Urinary tract infectious disease   . Arterial hypotension   . UTI (urinary tract infection) due to urinary indwelling catheter (HCC) 04/19/2015  . Pressure ulcer 04/19/2015  . Syncope and collapse 04/15/2015  . Syncopal episodes 04/15/2015  . Syncope 04/14/2015  . Acute kidney injury superimposed on chronic kidney disease (HCC) 04/14/2015  . Abnormality of gait 02/25/2015  . Palliative care encounter 12/10/2014  . Protein-calorie malnutrition, severe 12/06/2014  . Aspiration pneumonia of right lower lobe due to regurgitated food (HCC) 12/06/2014  . CKD (chronic kidney disease) stage 3, GFR 30-59 ml/min 12/06/2014  . Anemia of chronic renal failure, stage 3 (moderate) 12/06/2014  . Dyslipidemia 12/06/2014  . Coronary artery disease involving native coronary artery of native heart without angina pectoris 12/06/2014  . BPH (benign  prostatic hyperplasia) 12/06/2014  . Chronic diastolic congestive heart failure (HCC) 12/06/2014  . Depression 12/06/2014  . HCAP (healthcare-associated pneumonia) 12/05/2014  . Dementia with behavioral disturbance 11/23/2014  . Acute encephalopathy 11/06/2014  . UTI (lower urinary tract infection) 11/06/2014  . Hypothyroidism 01/26/2014  . Essential hypertension 10/21/2012    Past Surgical History:  Procedure Laterality Date  . BACK SURGERY    . CARDIAC CATHETERIZATION  02/25/2002   Proximal L Circumflex 95% lesion, stented w/ 3x18-mm CYPHER stent avoiding the 2 L Marginal branch, jailing the 1st marginal, resulting in reduction of a 90-95% lesion to 0% residual  . CARDIOVASCULAR STRESS TEST  12/18/2006   EKG negative for ischemia, no significant ischemia demonstrated.  . CORONARY ANGIOPLASTY WITH STENT PLACEMENT    . RENAL DOPPLER  08/17/2005   Normal evaluation  . THYROIDECTOMY, PARTIAL    . TRANSTHORACIC ECHOCARDIOGRAM  03/13/2002   EF >55%, moderate LVH,        Home Medications    Prior to Admission medications   Medication Sig Start Date End Date Taking? Authorizing Provider  aspirin EC 81 MG tablet Take 81 mg by mouth every morning.    Yes Historical Provider, MD  clopidogrel (PLAVIX) 75 MG tablet Take 75 mg by mouth daily.   Yes Historical Provider, MD  CRANBERRY PO Take 1 tablet by mouth every evening.    Yes Historical Provider, MD  dorzolamide (TRUSOPT) 2 % ophthalmic solution Place 1 drop into  the right eye 2 (two) times daily.  08/23/12  Yes Historical Provider, MD  finasteride (PROSCAR) 5 MG tablet Take 5 mg by mouth every evening.    Yes Historical Provider, MD  fludrocortisone (FLORINEF) 0.1 MG tablet Take 0.5 tablets (0.05 mg total) by mouth daily. Patient taking differently: Take 0.05 mg by mouth every morning.  04/23/15  Yes Costin Otelia Sergeant, MD  latanoprost (XALATAN) 0.005 % ophthalmic solution Place 1 drop into both eyes at bedtime.   Yes Historical Provider,  MD  levothyroxine (SYNTHROID, LEVOTHROID) 75 MCG tablet Take 75 mcg by mouth daily before breakfast.  11/06/14  Yes Historical Provider, MD  meloxicam (MOBIC) 15 MG tablet Take 15 mg by mouth every morning.  02/17/15  Yes Historical Provider, MD  midodrine (PROAMATINE) 5 MG tablet Take 1 tablet (5 mg total) by mouth 3 (three) times daily with meals. 05/04/15  Yes Dwana Melena, PA-C  OVER THE COUNTER MEDICATION Take 1 tablet by mouth every Monday, Wednesday, and Friday. With lunch.  Drop 1 tablet in water daily and drink- Over the counter [employer]   Yes Historical Provider, MD  QUEtiapine (SEROQUEL) 25 MG tablet Take 1 tablet (25 mg total) by mouth at bedtime. For behaviors 02/25/15  Yes Levert Feinstein, MD  ranitidine (ZANTAC) 75 MG tablet Take 75 mg by mouth every morning. Reported on 06/08/2015   Yes Historical Provider, MD  simvastatin (ZOCOR) 40 MG tablet Take 40 mg by mouth daily at 6 PM.    Yes Historical Provider, MD  timolol (TIMOPTIC) 0.5 % ophthalmic solution Place 1 drop into both eyes 2 (two) times daily.  09/18/12  Yes Historical Provider, MD  Vitamin D, Ergocalciferol, (DRISDOL) 50000 units CAPS capsule Take 50,000 Units by mouth 2 (two) times a week. Monday and Thursday in the evening   Yes Historical Provider, MD    Family History Family History  Problem Relation Age of Onset  . Family history unknown: Yes    Social History Social History  Substance Use Topics  . Smoking status: Former Games developer  . Smokeless tobacco: Never Used     Comment: Quit 50+ years ago.  . Alcohol use No     Allergies   Tramadol   Review of Systems Review of Systems  Unable to perform ROS: Dementia  Constitutional: Positive for fever. Negative for activity change.  Respiratory: Negative for shortness of breath.   Cardiovascular: Negative for chest pain.  Gastrointestinal: Negative for abdominal pain.  Genitourinary: Positive for hematuria. Negative for dysuria and flank pain.  All other systems  reviewed and are negative.    Physical Exam Updated Vital Signs BP 132/87 (BP Location: Right Arm)   Pulse 86   Temp 98.4 F (36.9 C) (Oral)   Resp 16   Ht 6' (1.829 m)   Wt 137 lb 8 oz (62.4 kg)   SpO2 100%   BMI 18.65 kg/m   Physical Exam  Constitutional: He appears well-developed and well-nourished.  HENT:  Head: Normocephalic and atraumatic.  Eyes: Conjunctivae are normal.  Neck: Neck supple.  Cardiovascular: Normal rate and regular rhythm.   No murmur heard. Pulmonary/Chest: Effort normal and breath sounds normal. No respiratory distress.  Abdominal: Soft. There is no tenderness.  No CVA tenderness  Genitourinary:  Genitourinary Comments: indwellign catheter  Musculoskeletal: He exhibits no edema.  Neurological: He is alert.  pelasantly demented, not acutely altered.   Skin: Skin is warm and dry.  Psychiatric: He has a normal mood and affect.  Nursing note and vitals reviewed.    ED Treatments / Results  Labs (all labs ordered are listed, but only abnormal results are displayed) Labs Reviewed  URINALYSIS, ROUTINE W REFLEX MICROSCOPIC (NOT AT [facility]) - Abnormal; Notable for the following:       Result Value   APPearance CLOUDY (*)    Hgb urine dipstick LARGE (*)    Protein, ur 100 (*)    Nitrite POSITIVE (*)    Leukocytes, UA LARGE (*)    All other components within normal limits  COMPREHENSIVE METABOLIC PANEL - Abnormal; Notable for the following:    Potassium 3.1 (*)    Glucose, Bld 111 (*)    Total Protein 9.3 (*)    ALT 13 (*)    All other components within normal limits  CBC WITH DIFFERENTIAL/PLATELET - Abnormal; Notable for the following:    WBC 12.0 (*)    Neutro Abs 9.7 (*)    Monocytes Absolute 1.5 (*)    All other components within normal limits  URINE MICROSCOPIC-ADD ON - Abnormal; Notable for the following:    Bacteria, UA RARE (*)    All other components within normal limits  URINE CULTURE  I-STAT TROPOININ, ED    EKG  EKG  Interpretation  Date/Time:  Saturday August 07 2015 12:08:41 EDT Ventricular Rate:  89 PR Interval:    QRS Duration: 124 QT Interval:  397 QTC Calculation: 484 R Axis:   -63 Text Interpretation:  Sinus rhythm Probable left atrial enlargement Nonspecific IVCD with LAD Left ventricular hypertrophy Anterior infarct, old No significant change since last tracing Confirmed by Kandis Mannan ([id number]) on 08/07/2015 12:34:54 PM       Radiology Dg Chest 2 View  Result Date: 08/07/2015 CLINICAL DATA:  Confusion, vomiting and fever. EXAM: CHEST  2 VIEW COMPARISON:  Chest x-ray dated 05/03/2015. FINDINGS: Heart size is normal. Overall cardiomediastinal silhouette is stable in size and configuration. Lungs are hyperexpanded. Lungs are clear. No pleural effusion or pneumothorax seen. No acute or suspicious osseous finding. IMPRESSION: No active cardiopulmonary disease.  Probable COPD. Electronically Signed   By: Bary Richard M.D.   On: 08/07/2015 12:25    Procedures Procedures (including critical care time)  Medications Ordered in ED Medications  sodium chloride 0.9 % bolus 1,000 mL (1,000 mLs Intravenous New Bag/Given 08/07/15 1349)  potassium chloride 10 mEq in 100 mL IVPB (10 mEq Intravenous New Bag/Given 08/07/15 1349)     Initial Impression / Assessment and Plan / ED Course  I have reviewed the triage vital signs and the nursing notes.  Pertinent labs & imaging results that were available during my care of the patient were reviewed by me and considered in my medical decision making (see chart for details).  Clinical Course   Pt is a 82 year old male presenting today with hematuria. Patient has chronic indwelling Foley and often has UTIs. Today he has thinks these UTI (confusion and cloudy urine. Patient had a fever prior to arrival does not have a fever here. Patient has no pain in the back. Doubt stone. Will get chest x-ray and labs to make sure the patient does not have other cause of  infection causing confusion.   3:24 PM UTI +. Will start on abx. Patient tolerating PO and vital signs.   Final Clinical Impressions(s) / ED Diagnoses   Final diagnoses:  None    New Prescriptions New Prescriptions   No medications on file     Larcenia Holaday Randall An, MD 08/07/15  1524  

## 2015-08-07 NOTE — ED Notes (Signed)
Patient is not in room unable to collect labs at this time

## 2015-08-07 NOTE — ED Triage Notes (Addendum)
Per EMS pt coming from home, caregiver reported pt was confused and not as alert as normal, pt has hx of recurrent UTIs as well as sepsis. Per EMS pt is A+O, denies pain. 1g tylenol given en route

## 2015-08-10 LAB — URINE CULTURE: Culture: >=100000 — AB

## 2015-08-11 ENCOUNTER — Telehealth (HOSPITAL_BASED_OUTPATIENT_CLINIC_OR_DEPARTMENT_OTHER): Payer: Self-pay | Admitting: *Deleted

## 2015-08-11 NOTE — Progress Notes (Signed)
ED Antimicrobial Stewardship Positive Culture Follow Up   Jeremy CoxJames T [name] is an 82 y.o. male [facility] with a chief complaint of  Chief Complaint  Patient presents with  . Fever  . Hematuria    Recent Results (from the past 720 hour(s))  Urine culture     Status: Abnormal   Collection Time: 08/07/15 11:21 AM  Result Value Ref Range Status   Specimen Description URINE, CLEAN CATCH  Final   Special Requests NONE  Final   Culture (A)  Final    >=100,000 COLONIES/mL METHICILLIN RESISTANT STAPHYLOCOCCUS AUREUS 70,000 COLONIES/mL ENTEROCOCCUS SPECIES    Report Status 08/10/2015 FINAL  Final   Organism ID, Bacteria METHICILLIN RESISTANT STAPHYLOCOCCUS AUREUS (A)  Final   Organism ID, Bacteria ENTEROCOCCUS SPECIES (A)  Final      Susceptibility   Enterococcus species - MIC*    AMPICILLIN <=2 SENSITIVE Sensitive     LEVOFLOXACIN >=8 RESISTANT Resistant     NITROFURANTOIN <=16 SENSITIVE Sensitive     VANCOMYCIN 1 SENSITIVE Sensitive     * 70,000 COLONIES/mL ENTEROCOCCUS SPECIES   Methicillin resistant staphylococcus aureus - MIC*    CIPROFLOXACIN >=8 RESISTANT Resistant     GENTAMICIN <=0.5 SENSITIVE Sensitive     NITROFURANTOIN <=16 SENSITIVE Sensitive     OXACILLIN >=4 RESISTANT Resistant     TETRACYCLINE <=1 SENSITIVE Sensitive     VANCOMYCIN <=0.5 SENSITIVE Sensitive     TRIMETH/SULFA <=10 SENSITIVE Sensitive     CLINDAMYCIN <=0.25 SENSITIVE Sensitive     RIFAMPIN <=0.5 SENSITIVE Sensitive     Inducible Clindamycin NEGATIVE Sensitive     * >=100,000 COLONIES/mL METHICILLIN RESISTANT STAPHYLOCOCCUS AUREUS    [x]  Treated with cephalexin, organism resistant to prescribed antimicrobial.   New antibiotic prescription: Bactrim DS PO BID for 10 days and Amoxicillin 500mg  PO BID for 10 days. Stop Cephalexin.  ED Provider: Elizabeth SauerJaime Ward, PA-C  Jeremy KaufmanKai Simi Briel [name], PharmD  PGY1 Pharmacy Resident Pager: [phone] 08/11/2015 12:07 PM

## 2015-08-15 NOTE — Telephone Encounter (Signed)
Post ED Visit - Positive Culture Follow-up: Successful Patient Follow-Up  Culture assessed and recommendations reviewed by: []  Enzo BiNathan Batchelder, Pharm.D. []  Celedonio MiyamotoJeremy Frens, Pharm.D., BCPS []  Garvin FilaMike Maccia, Pharm.D. []  Georgina PillionElizabeth Martin, Pharm.D., BCPS []  VashonMinh Pham, [address] Rainbow BoulevardPharm.D., BCPS, AAHIVP []  Estella HuskMichelle Turner, Pharm.D., BCPS, AAHIVP []  Tennis Mustassie Stewart, Pharm.D. []  Sherle Poeob Vincent, Vermont[place]D.  Positive urine culture  []  Patient discharged without antimicrobial prescription and treatment is now indicated [x]  Organism is resistant to prescribed ED discharge antimicrobial []  Patient with positive blood cultures  Changes discussed with ED provider: Ebbie RidgeLawyer, [facility] New antibiotic prescription Bacrim DS [facility] [phone]  Contacted patient, date 08/15/15, time 1000   Aluel Schwarz, Linnell FullingRose Burnett 08/15/2015, 9:59 AM

## 2015-08-25 ENCOUNTER — Observation Stay (HOSPITAL_COMMUNITY)
Admission: EM | Admit: 2015-08-25 | Discharge: 2015-08-26 | Disposition: A | Discharge status: Home / Self Care | Payer: Medicare Other | Attending: Internal Medicine | Admitting: Internal Medicine

## 2015-08-25 ENCOUNTER — Encounter (HOSPITAL_COMMUNITY): Payer: Self-pay | Admitting: Emergency Medicine

## 2015-08-25 DIAGNOSIS — E785 Hyperlipidemia, unspecified: Secondary | ICD-10-CM | POA: Diagnosis not present

## 2015-08-25 DIAGNOSIS — I129 Hypertensive chronic kidney disease with stage 1 through stage 4 chronic kidney disease, or unspecified chronic kidney disease: Secondary | ICD-10-CM | POA: Diagnosis not present

## 2015-08-25 DIAGNOSIS — R112 Nausea with vomiting, unspecified: Secondary | ICD-10-CM | POA: Diagnosis present

## 2015-08-25 DIAGNOSIS — N400 Enlarged prostate without lower urinary tract symptoms: Secondary | ICD-10-CM | POA: Insufficient documentation

## 2015-08-25 DIAGNOSIS — Z791 Long term (current) use of non-steroidal anti-inflammatories (NSAID): Secondary | ICD-10-CM | POA: Insufficient documentation

## 2015-08-25 DIAGNOSIS — I959 Hypotension, unspecified: Secondary | ICD-10-CM | POA: Diagnosis present

## 2015-08-25 DIAGNOSIS — I2510 Atherosclerotic heart disease of native coronary artery without angina pectoris: Secondary | ICD-10-CM | POA: Diagnosis present

## 2015-08-25 DIAGNOSIS — Z87891 Personal history of nicotine dependence: Secondary | ICD-10-CM | POA: Diagnosis not present

## 2015-08-25 DIAGNOSIS — N189 Chronic kidney disease, unspecified: Secondary | ICD-10-CM

## 2015-08-25 DIAGNOSIS — E039 Hypothyroidism, unspecified: Secondary | ICD-10-CM | POA: Diagnosis not present

## 2015-08-25 DIAGNOSIS — R55 Syncope and collapse: Secondary | ICD-10-CM | POA: Diagnosis present

## 2015-08-25 DIAGNOSIS — N183 Chronic kidney disease, stage 3 unspecified: Secondary | ICD-10-CM | POA: Diagnosis present

## 2015-08-25 DIAGNOSIS — Z7902 Long term (current) use of antithrombotics/antiplatelets: Secondary | ICD-10-CM | POA: Insufficient documentation

## 2015-08-25 DIAGNOSIS — Z79899 Other long term (current) drug therapy: Secondary | ICD-10-CM | POA: Diagnosis not present

## 2015-08-25 DIAGNOSIS — Z87440 Personal history of urinary (tract) infections: Secondary | ICD-10-CM | POA: Diagnosis not present

## 2015-08-25 DIAGNOSIS — E875 Hyperkalemia: Secondary | ICD-10-CM | POA: Diagnosis not present

## 2015-08-25 DIAGNOSIS — I252 Old myocardial infarction: Secondary | ICD-10-CM | POA: Diagnosis not present

## 2015-08-25 DIAGNOSIS — F0390 Unspecified dementia without behavioral disturbance: Secondary | ICD-10-CM | POA: Diagnosis not present

## 2015-08-25 DIAGNOSIS — N179 Acute kidney failure, unspecified: Secondary | ICD-10-CM | POA: Diagnosis not present

## 2015-08-25 DIAGNOSIS — Z7982 Long term (current) use of aspirin: Secondary | ICD-10-CM | POA: Diagnosis not present

## 2015-08-25 DIAGNOSIS — Z955 Presence of coronary angioplasty implant and graft: Secondary | ICD-10-CM | POA: Insufficient documentation

## 2015-08-25 DIAGNOSIS — I951 Orthostatic hypotension: Secondary | ICD-10-CM

## 2015-08-25 LAB — COMPREHENSIVE METABOLIC PANEL
ALBUMIN: 4.1 g/dL (ref 3.5–5.0)
ALK PHOS: 75 U/L (ref 38–126)
ALT: 12 U/L — ABNORMAL LOW (ref 17–63)
ANION GAP: 4 — AB (ref 5–15)
AST: 20 U/L (ref 15–41)
BILIRUBIN TOTAL: 0.4 mg/dL (ref 0.3–1.2)
BUN: 28 mg/dL — AB (ref 6–20)
CALCIUM: 9.4 mg/dL (ref 8.9–10.3)
CO2: 23 mmol/L (ref 22–32)
Chloride: 111 mmol/L (ref 101–111)
Creatinine, Ser: 1.87 mg/dL — ABNORMAL HIGH (ref 0.61–1.24)
GFR calc Af Amer: 35 mL/min — ABNORMAL LOW (ref >60)
GFR, EST NON AFRICAN AMERICAN: 30 mL/min — AB (ref >60)
GLUCOSE: 96 mg/dL (ref 65–99)
POTASSIUM: 4.4 mmol/L (ref 3.5–5.1)
Sodium: 138 mmol/L (ref 135–145)
TOTAL PROTEIN: 8.8 g/dL — AB (ref 6.5–8.1)

## 2015-08-25 LAB — URINALYSIS, ROUTINE W REFLEX MICROSCOPIC
BILIRUBIN URINE: NEGATIVE
Glucose, UA: NEGATIVE mg/dL
KETONES UR: NEGATIVE mg/dL
NITRITE: POSITIVE — AB
PH: 6.5 (ref 5.0–8.0)
PROTEIN: NEGATIVE mg/dL
Specific Gravity, Urine: 1.013 (ref 1.005–1.030)

## 2015-08-25 LAB — CBC WITH DIFFERENTIAL/PLATELET
BASOS PCT: 1 %
Basophils Absolute: 0 10*3/uL (ref 0.0–0.1)
EOS ABS: 0.4 10*3/uL (ref 0.0–0.7)
EOS PCT: 4 %
HCT: 36.3 % — ABNORMAL LOW (ref 39.0–52.0)
Hemoglobin: 11.8 g/dL — ABNORMAL LOW (ref 13.0–17.0)
LYMPHS ABS: 1.6 10*3/uL (ref 0.7–4.0)
Lymphocytes Relative: 20 %
MCH: 28.4 pg (ref 26.0–34.0)
MCHC: 32.5 g/dL (ref 30.0–36.0)
MCV: 87.3 fL (ref 78.0–100.0)
MONOS PCT: 9 %
Monocytes Absolute: 0.7 10*3/uL (ref 0.1–1.0)
Neutro Abs: 5.4 10*3/uL (ref 1.7–7.7)
Neutrophils Relative %: 66 %
PLATELETS: 186 10*3/uL (ref 150–400)
RBC: 4.16 MIL/uL — ABNORMAL LOW (ref 4.22–5.81)
RDW: 14.9 % (ref 11.5–15.5)
WBC: 8.1 10*3/uL (ref 4.0–10.5)

## 2015-08-25 LAB — I-STAT TROPONIN, ED: TROPONIN I, POC: 0.01 ng/mL (ref 0.00–0.08)

## 2015-08-25 LAB — I-STAT CHEM 8, ED
BUN: 34 mg/dL — ABNORMAL HIGH (ref 6–20)
Calcium, Ion: 1.2 mmol/L (ref 1.12–1.23)
Chloride: 108 mmol/L (ref 101–111)
Creatinine, Ser: 1.7 mg/dL — ABNORMAL HIGH (ref 0.61–1.24)
Glucose, Bld: 86 mg/dL (ref 65–99)
HEMATOCRIT: 45 % (ref 39.0–52.0)
HEMOGLOBIN: 15.3 g/dL (ref 13.0–17.0)
POTASSIUM: 5.8 mmol/L — AB (ref 3.5–5.1)
Sodium: 142 mmol/L (ref 135–145)
TCO2: 24 mmol/L (ref 0–100)

## 2015-08-25 LAB — URINE MICROSCOPIC-ADD ON: SQUAMOUS EPITHELIAL / LPF: NONE SEEN

## 2015-08-25 LAB — LIPASE, BLOOD: LIPASE: 36 U/L (ref 11–51)

## 2015-08-25 LAB — TROPONIN I: Troponin I: <0.03 ng/mL (ref <0.03)

## 2015-08-25 LAB — MRSA PCR SCREENING: MRSA by PCR: NEGATIVE

## 2015-08-25 MED ORDER — SIMVASTATIN 40 MG PO TABS
40.0000 mg | ORAL_TABLET | Freq: Every day | ORAL | Status: DC
  Administered 2015-08-25: 40 mg via ORAL
  Filled 2015-08-25: qty 1

## 2015-08-25 MED ORDER — ONDANSETRON HCL 4 MG/2ML IJ SOLN: 4.0000 mg | Freq: Four times a day (QID) | INTRAMUSCULAR | Status: DC | PRN

## 2015-08-25 MED ORDER — MIDODRINE HCL 5 MG PO TABS
5.0000 mg | ORAL_TABLET | Freq: Three times a day (TID) | ORAL | Status: DC
  Administered 2015-08-26 (×2): 5 mg via ORAL
  Filled 2015-08-25 (×2): qty 1

## 2015-08-25 MED ORDER — FINASTERIDE 5 MG PO TABS
5.0000 mg | ORAL_TABLET | Freq: Every evening | ORAL | Status: DC
  Administered 2015-08-25: 5 mg via ORAL
  Filled 2015-08-25: qty 1

## 2015-08-25 MED ORDER — SODIUM CHLORIDE 0.9% FLUSH
3.0000 mL | Freq: Two times a day (BID) | INTRAVENOUS | Status: DC
  Administered 2015-08-25: 3 mL via INTRAVENOUS

## 2015-08-25 MED ORDER — SODIUM CHLORIDE 0.9 % IV BOLUS (SEPSIS)
1000.0000 mL | Freq: Once | INTRAVENOUS | Status: AC
  Administered 2015-08-25: 1000 mL via INTRAVENOUS

## 2015-08-25 MED ORDER — ACETAMINOPHEN 325 MG PO TABS: 650.0000 mg | ORAL_TABLET | Freq: Four times a day (QID) | ORAL | Status: DC | PRN

## 2015-08-25 MED ORDER — FAMOTIDINE 20 MG PO TABS
10.0000 mg | ORAL_TABLET | Freq: Every day | ORAL | Status: DC
  Administered 2015-08-26: 10 mg via ORAL
  Filled 2015-08-25: qty 1

## 2015-08-25 MED ORDER — QUETIAPINE FUMARATE 25 MG PO TABS
25.0000 mg | ORAL_TABLET | Freq: Every day | ORAL | Status: DC
  Administered 2015-08-25: 25 mg via ORAL
  Filled 2015-08-25: qty 1

## 2015-08-25 MED ORDER — LATANOPROST 0.005 % OP SOLN
1.0000 [drp] | Freq: Every day | OPHTHALMIC | Status: DC
  Administered 2015-08-25: 1 [drp] via OPHTHALMIC
  Filled 2015-08-25: qty 2.5

## 2015-08-25 MED ORDER — VITAMIN D (ERGOCALCIFEROL) 1.25 MG (50000 UNIT) PO CAPS: 50000.0000 [IU] | ORAL_CAPSULE | ORAL | Status: DC

## 2015-08-25 MED ORDER — SODIUM CHLORIDE 0.45 % IV SOLN
INTRAVENOUS | Status: DC
  Administered 2015-08-25 – 2015-08-26 (×2): via INTRAVENOUS

## 2015-08-25 MED ORDER — FLUDROCORTISONE ACETATE 0.1 MG PO TABS
0.0500 mg | ORAL_TABLET | Freq: Every day | ORAL | Status: DC
  Administered 2015-08-26: 0.05 mg via ORAL
  Filled 2015-08-25: qty 0.5

## 2015-08-25 MED ORDER — MELOXICAM 15 MG PO TABS
15.0000 mg | ORAL_TABLET | Freq: Every day | ORAL | Status: DC
  Administered 2015-08-26: 15 mg via ORAL
  Filled 2015-08-25: qty 1

## 2015-08-25 MED ORDER — LEVOTHYROXINE SODIUM 75 MCG PO TABS
75.0000 ug | ORAL_TABLET | Freq: Every day | ORAL | Status: DC
  Administered 2015-08-26: 75 ug via ORAL
  Filled 2015-08-25: qty 1

## 2015-08-25 MED ORDER — DORZOLAMIDE HCL 2 % OP SOLN
1.0000 [drp] | Freq: Two times a day (BID) | OPHTHALMIC | Status: DC
  Administered 2015-08-25 – 2015-08-26 (×2): 1 [drp] via OPHTHALMIC
  Filled 2015-08-25: qty 10

## 2015-08-25 MED ORDER — ONDANSETRON HCL 4 MG PO TABS: 4.0000 mg | ORAL_TABLET | Freq: Four times a day (QID) | ORAL | Status: DC | PRN

## 2015-08-25 MED ORDER — TIMOLOL MALEATE 0.5 % OP SOLN
1.0000 [drp] | Freq: Two times a day (BID) | OPHTHALMIC | Status: DC
  Administered 2015-08-25 – 2015-08-26 (×2): 1 [drp] via OPHTHALMIC
  Filled 2015-08-25: qty 5

## 2015-08-25 MED ORDER — ASPIRIN EC 81 MG PO TBEC
81.0000 mg | DELAYED_RELEASE_TABLET | Freq: Every day | ORAL | Status: DC
  Administered 2015-08-26: 81 mg via ORAL
  Filled 2015-08-25: qty 1

## 2015-08-25 MED ORDER — ACETAMINOPHEN 650 MG RE SUPP: 650.0000 mg | Freq: Four times a day (QID) | RECTAL | Status: DC | PRN

## 2015-08-25 MED ORDER — ENOXAPARIN SODIUM 30 MG/0.3ML ~~LOC~~ SOLN
30.0000 mg | SUBCUTANEOUS | Status: DC
  Administered 2015-08-25: 30 mg via SUBCUTANEOUS
  Filled 2015-08-25: qty 0.3

## 2015-08-25 MED ORDER — CLOPIDOGREL BISULFATE 75 MG PO TABS
75.0000 mg | ORAL_TABLET | Freq: Every day | ORAL | Status: DC
  Administered 2015-08-26: 75 mg via ORAL
  Filled 2015-08-25: qty 1

## 2015-08-25 MED ORDER — SODIUM CHLORIDE 0.9 % IV BOLUS (SEPSIS)
500.0000 mL | Freq: Once | INTRAVENOUS | Status: AC
  Administered 2015-08-25: 500 mL via INTRAVENOUS

## 2015-08-25 NOTE — H&P (Signed)
[facility] History and Physical  RODERICK CALO HER:[id number] DOB: Dec 30, 1925 DOA: 08/25/2015  Referring physician: Dr. Johnney Killian PCP: Minette Brine   Chief Complaint: N/V, presyncope  HPI: Jeremy [name] is a 82 y.o. male with hx of HTN, dementia, CAD, HL, hypotension, hypoT4 and syncope presenting with N/V and presyncope at home this morning.  He had breakfast about 9am and had an 11 am doctor's appt , he was "hurrying" to get ready, then he had an episode of recurrent nausea/ vomiting. He has had these episodes before in the past. He vomited several times in large amounts.  After this his BP was measured at home by the private duty help/ nurse and his BP was in the 70's.  EMS was called , BP confirmed in the 70's, given IVF and brought to ED.  By arrival BP was normal 124/ 82 and has been stable since.  Labs showed bump in creat up to 1.87 from baseline 0.8 , got IVF"s and repeat labs , but creat remained up so asked to see for admission.    Patient here multiple times this and last year for urinary retention/ UTI's, syncopal episodes w hypotension and AMS. He has required an indwelling foley.  On his 3rd admit in April all his BP/ cardiac meds were stopped (Imdur/ losartan/ beta-blocker).  He has done much better since then. Also was started on fluorinef and midodrine.  Since then had one admit for UTI, has chron indwelling foley changed monthyly, f/b urology.  He says he is much stronger, and is walking again at home. \He denies any current or recent abd pain, diarreha, CP/SOB, no fever/ chills/ sweats. No cloudy urine.       Admits: Jan '16 - syncope vasovagal, brady w n/v. Echo wnl, EF 55%. A/C renal. CAP.  Oct '16 - acute R groin pain w n/v. AMS per pcp x wks. Neg hip CT/ xray, +urinary retention, dc'd w foley in place and urol f/u.  AKI, resolved. R hip DJD. HTN. CAD on nitrates/ and BB, plavix/ statin/ asa. Spinal stenosis w chron low BP.  Nov '16 - 3 wks confusion, weakness, UTI  dx'd in ED recnetly. Unable to eat or walk. +UTI, MRI neg, blood cx neg, RPR, HIV , NH3 neg. B12 lwo normal, replace.  EEG c/w TME.  Neuro consult. TME. UTI Rocephin.  Dec '16 - coughing w n/v, RLL PNA/ fever, rx IV abx for HCAP vs asp PNA, pt from SNF. Bcx neg, dc's on augmentin. Poss UTI vs colonizer. BP'slow, cont Imdur but monitor. CAD on asa plavix, imdur, statin. Pall care encoutner Apr '17 - syncope /hypotension. Improved with IVF. A/C renall , resolved. HTN / CAD on BB/ ARB/ imdur.  Apr '17 - syncope and collapse, passed out at dinner table. ?vasovagal. ECHO normal, again.CT chest neg, except R effusion. HTN. UTI w pseudomonas, dc on Cipro.  Apr '17 - recurrent syncopal episode.  Cortisol 13, not c/w adrenal failure. Hold all BP meds as BP's stable w holding them inpatient. Started florinef for component of orthostasis. CT angio neck negative.TSH ok.  CKD 2-3. Hx CAD w NSTEMI '04 w DES to LCX.   Jun '17 - SIRS w UTI. Foley removed on 6/5 by urologist, presented next day 6.6 w acute urinary retention and UTI.  Pseudomonas UTI.     ROS  denies CP  no joint pain   no HA  no blurry vision  no rash     Past Medical History  Past Medical History:  Diagnosis Date  . CAD (coronary artery disease)   . Dementia   . HTN (hypertension)   . Hyperlipidemia   . Hypotension   . Hypothyroidism    s/p Thyroidectomy, partial  . Sepsis ([place])   . Syncope    4/11 (hospitalized) and 4/13  . Syncope   . UTI (lower urinary tract infection)    Past Surgical History  Past Surgical History:  Procedure Laterality Date  . BACK SURGERY    . CARDIAC CATHETERIZATION  02/25/2002   Proximal L Circumflex 95% lesion, stented w/ 3x18-mm CYPHER stent avoiding the 2 L Marginal branch, jailing the 1st marginal, resulting in reduction of a 90-95% lesion to 0% residual  . CARDIOVASCULAR STRESS TEST  12/18/2006   EKG negative for ischemia, no significant ischemia demonstrated.  . CORONARY ANGIOPLASTY WITH STENT  PLACEMENT    . RENAL DOPPLER  08/17/2005   Normal evaluation  . THYROIDECTOMY, PARTIAL    . TRANSTHORACIC ECHOCARDIOGRAM  03/13/2002   EF >55%, moderate LVH,    Family History  Family History  Problem Relation Age of Onset  . Family history unknown: Yes   Social History  reports that he has quit smoking. He has never used smokeless tobacco. He reports that he does not drink alcohol or use drugs. Allergies  Allergies  Allergen Reactions  . Tramadol Nausea And Vomiting   Home medications Prior to Admission medications   Medication Sig Start Date End Date Taking? Authorizing Provider  aspirin EC 81 MG tablet Take 81 mg by mouth every morning.    Yes Historical Provider, MD  clopidogrel (PLAVIX) 75 MG tablet Take 75 mg by mouth daily.   Yes Historical Provider, MD  CRANBERRY PO Take 1 tablet by mouth every evening.    Yes Historical Provider, MD  dorzolamide (TRUSOPT) 2 % ophthalmic solution Place 1 drop into the right eye 2 (two) times daily.  08/23/12  Yes Historical Provider, MD  finasteride (PROSCAR) 5 MG tablet Take 5 mg by mouth every evening.    Yes Historical Provider, MD  fludrocortisone (FLORINEF) 0.1 MG tablet Take 0.5 tablets (0.05 mg total) by mouth daily. Patient taking differently: Take 0.05 mg by mouth every morning.  04/23/15  Yes Costin Karlyne Greenspan, MD  latanoprost (XALATAN) 0.005 % ophthalmic solution Place 1 drop into both eyes at bedtime.   Yes Historical Provider, MD  levothyroxine (SYNTHROID, LEVOTHROID) 75 MCG tablet Take 75 mcg by mouth daily before breakfast.  11/06/14  Yes Historical Provider, MD  meloxicam (MOBIC) 15 MG tablet Take 15 mg by mouth every morning.  02/17/15  Yes Historical Provider, MD  midodrine (PROAMATINE) 5 MG tablet Take 1 tablet (5 mg total) by mouth 3 (three) times daily with meals. 05/04/15  Yes Brett Canales, PA-C  OVER THE COUNTER MEDICATION Take 1 tablet by mouth every Monday, Wednesday, and Friday. With lunch.  Drop 1 tablet in water daily and  drink- Over the counter [employer]   Yes Historical Provider, MD  QUEtiapine (SEROQUEL) 25 MG tablet Take 1 tablet (25 mg total) by mouth at bedtime. For behaviors 02/25/15  Yes Marcial Pacas, MD  ranitidine (ZANTAC) 75 MG tablet Take 75 mg by mouth every morning. Reported on 06/08/2015   Yes Historical Provider, MD  simvastatin (ZOCOR) 40 MG tablet Take 40 mg by mouth daily at 6 PM.    Yes Historical Provider, MD  timolol (TIMOPTIC) 0.5 % ophthalmic solution Place 1 drop into both eyes 2 (  two) times daily.  09/18/12  Yes Historical Provider, MD  Vitamin D, Ergocalciferol, (DRISDOL) 50000 units CAPS capsule Take 50,000 Units by mouth 2 (two) times a week. Monday and Thursday in the evening   Yes Historical Provider, MD   Liver Function Tests  Recent Labs Lab 08/25/15 1307  AST 20  ALT 12*  ALKPHOS 75  BILITOT 0.4  PROT 8.8*  ALBUMIN 4.1    Recent Labs Lab 08/25/15 1307  LIPASE 36   CBC  Recent Labs Lab 08/25/15 1307 08/25/15 1619  WBC 8.1  --   NEUTROABS 5.4  --   HGB 11.8* 15.3  HCT 36.3* 45.0  MCV 87.3  --   PLT 186  --    Basic Metabolic Panel  Recent Labs Lab 08/25/15 1307 08/25/15 1619  NA 138 142  K 4.4 5.8*  CL 111 108  CO2 23  --   GLUCOSE 96 86  BUN 28* 34*  CREATININE 1.87* 1.70*  CALCIUM 9.4  --      Vitals:   08/25/15 1530 08/25/15 1600 08/25/15 1730 08/25/15 1800  BP: 157/92 160/88 156/98 148/97  Pulse: 84 79 73 67  Resp: _0 Temp:      TempSrc:      SpO2: 98% 99% 100% 99%  Weight:      Height:       Exam: Gen alert, no distress, BP 157/78 lying No rash, cyanosis or gangrene Sclera anicteric, throat clear and moist No jvd or bruits Chest clear bilat RRR no MRG Abd soft ntnd no mass or ascites +bs GU normal male w foley draining clear amber urine MS no joint effusions or deformity Ext no LE or UE edema / no wounds or ulcers Neuro is alert, nonfocal, pleasant and responds appropriately   Na  142  K 5.8  BUN 34 Creat 1.70   eGFR 30  Ca 9.4  Alb 4.1  LFT's ok Tprot 8.8 ^ Trop < 0.03, WBC 8k  Hb 11.8 plt nl  CXR 8/5 > COPD, no acute UCx 8/5 + MRSA/ enterococcus  EKG (independ reviewed) > NSR, borderline IVCD with LAD, prob old AS infarct    Assessment: 1.  Presyncope/ hypotension - suspect this was secondary to N/V episode w vagal effects.  He has had several admits in the last year for similar issues but since stopping all his cardiac/ BP meds and starting Fluorinef/ midodrine in late April,he has been doing a lot better. BP's are on the high side but we are avoiding treating this due to hx of orthostasis.  Will admit, give IVF's overnight, abd is benign.  DC in am if labs are improving (renal).  2.  Nausea/ vomiting - not sure food intolerance, anxiety.  None in ED. Acute issue. Reg diet.   3.  Orthostatic hypotension - cont midodrine/ fluorinef 4.  CAD hx stent 2006 5.  Memory deficits - would consider stopping chol-lowering medication w this. Proposed to pt/' family but they didn't respond.  6.  Chron indwelling foley - UA pend, clear grossly 7.  A/ CRF - repeat labs in am after IVF's 8.  EOL - asked him to consider code status,he said he would d/w family   Plan - as above     Sol Blazing [facility] Pager [phone]  Cell [phone]  If 7PM-7AM, please contact night-coverage [email or web address] Password [facility] 08/25/2015, 6:42 PM

## 2015-08-25 NOTE — ED Provider Notes (Signed)
[facility]-EMERGENCY DEPT Provider Note   CSN: [id number] Arrival date & time: 08/25/15  1134     History   Chief Complaint Chief Complaint  Patient presents with  . Emesis  . Hypotension    HPI Jeremy [name] is a 82 y.o. male.  HPI Patient had gotten up this morning and was at baseline. He had eaten his breakfast of Cheerios and coffee. After breakfast he had been sitting at the table and abruptly became pale and poorly responsive then put his head forward on the table. His home health nurse took his blood pressure and it was 60s/40s. Patient vomited once. Medics were called. They evaluated the patient and administered 500ml of IV fluids on route. EMS blood pressure was initially 72\50. By the time of arrival, patient had improved. His blood pressure was greater than 100. Patient denies he had any pain associated with the episode. Past Medical History:  Diagnosis Date  . CAD (coronary artery disease)   . Dementia   . HTN (hypertension)   . Hyperlipidemia   . Hypotension   . Hypothyroidism    s/p Thyroidectomy, partial  . Sepsis (HCC)   . Syncope    4/11 (hospitalized) and 4/13  . Syncope   . UTI (lower urinary tract infection)     Patient Active Problem List   Diagnosis Date Noted  . SIRS (systemic inflammatory response syndrome) (HCC) 06/08/2015  . Orthostatic hypotension 06/01/2015  . Confusion   . Near syncope   . Urinary tract infectious disease   . Arterial hypotension   . UTI (urinary tract infection) due to urinary indwelling catheter (HCC) 04/19/2015  . Pressure ulcer 04/19/2015  . Syncope and collapse 04/15/2015  . Syncopal episodes 04/15/2015  . Syncope 04/14/2015  . Acute kidney injury superimposed on chronic kidney disease (HCC) 04/14/2015  . Abnormality of gait 02/25/2015  . Palliative care encounter 12/10/2014  . Protein-calorie malnutrition, severe 12/06/2014  . Aspiration pneumonia of right lower lobe due to regurgitated food (HCC) 12/06/2014  . CKD  (chronic kidney disease) stage 3, GFR 30-59 ml/min 12/06/2014  . Anemia of chronic renal failure, stage 3 (moderate) 12/06/2014  . Dyslipidemia 12/06/2014  . Coronary artery disease involving native coronary artery of native heart without angina pectoris 12/06/2014  . BPH (benign prostatic hyperplasia) 12/06/2014  . Chronic diastolic congestive heart failure (HCC) 12/06/2014  . Depression 12/06/2014  . HCAP (healthcare-associated pneumonia) 12/05/2014  . Dementia with behavioral disturbance 11/23/2014  . Acute encephalopathy 11/06/2014  . UTI (lower urinary tract infection) 11/06/2014  . Hypothyroidism 01/26/2014  . Essential hypertension 10/21/2012    Past Surgical History:  Procedure Laterality Date  . BACK SURGERY    . CARDIAC CATHETERIZATION  02/25/2002   Proximal L Circumflex 95% lesion, stented w/ 3x18-mm CYPHER stent avoiding the 2 L Marginal branch, jailing the 1st marginal, resulting in reduction of a 90-95% lesion to 0% residual  . CARDIOVASCULAR STRESS TEST  12/18/2006   EKG negative for ischemia, no significant ischemia demonstrated.  . CORONARY ANGIOPLASTY WITH STENT PLACEMENT    . RENAL DOPPLER  08/17/2005   Normal evaluation  . THYROIDECTOMY, PARTIAL    . TRANSTHORACIC ECHOCARDIOGRAM  03/13/2002   EF >55%, moderate LVH,        Home Medications    Prior to Admission medications   Medication Sig Start Date End Date Taking? Authorizing Provider  aspirin EC 81 MG tablet Take 81 mg by mouth every morning.    Yes Historical Provider, MD  clopidogrel (  PLAVIX) 75 MG tablet Take 75 mg by mouth daily.   Yes Historical Provider, MD  CRANBERRY PO Take 1 tablet by mouth every evening.    Yes Historical Provider, MD  dorzolamide (TRUSOPT) 2 % ophthalmic solution Place 1 drop into the right eye 2 (two) times daily.  08/23/12  Yes Historical Provider, MD  finasteride (PROSCAR) 5 MG tablet Take 5 mg by mouth every evening.    Yes Historical Provider, MD  fludrocortisone (FLORINEF)  0.1 MG tablet Take 0.5 tablets (0.05 mg total) by mouth daily. Patient taking differently: Take 0.05 mg by mouth every morning.  04/23/15  Yes Costin Otelia Sergeant, MD  latanoprost (XALATAN) 0.005 % ophthalmic solution Place 1 drop into both eyes at bedtime.   Yes Historical Provider, MD  levothyroxine (SYNTHROID, LEVOTHROID) 75 MCG tablet Take 75 mcg by mouth daily before breakfast.  11/06/14  Yes Historical Provider, MD  meloxicam (MOBIC) 15 MG tablet Take 15 mg by mouth every morning.  02/17/15  Yes Historical Provider, MD  midodrine (PROAMATINE) 5 MG tablet Take 1 tablet (5 mg total) by mouth 3 (three) times daily with meals. 05/04/15  Yes Dwana Melena, PA-C  OVER THE COUNTER MEDICATION Take 1 tablet by mouth every Monday, Wednesday, and Friday. With lunch.  Drop 1 tablet in water daily and drink- Over the counter [employer]   Yes Historical Provider, MD  QUEtiapine (SEROQUEL) 25 MG tablet Take 1 tablet (25 mg total) by mouth at bedtime. For behaviors 02/25/15  Yes Levert Feinstein, MD  ranitidine (ZANTAC) 75 MG tablet Take 75 mg by mouth every morning. Reported on 06/08/2015   Yes Historical Provider, MD  simvastatin (ZOCOR) 40 MG tablet Take 40 mg by mouth daily at 6 PM.    Yes Historical Provider, MD  timolol (TIMOPTIC) 0.5 % ophthalmic solution Place 1 drop into both eyes 2 (two) times daily.  09/18/12  Yes Historical Provider, MD  Vitamin D, Ergocalciferol, (DRISDOL) 50000 units CAPS capsule Take 50,000 Units by mouth 2 (two) times a week. Monday and Thursday in the evening   Yes Historical Provider, MD    Family History Family History  Problem Relation Age of Onset  . Family history unknown: Yes    Social History Social History  Substance Use Topics  . Smoking status: Former Games developer  . Smokeless tobacco: Never Used     Comment: Quit 50+ years ago.  . Alcohol use No     Allergies   Tramadol   Review of Systems Review of Systems 10 Systems reviewed and are negative for acute change except as  noted in the HPI.   Physical Exam Updated Vital Signs BP 148/97   Pulse 67   Temp 97.8 F (36.6 C) (Oral)   Resp 12   Ht 6' (1.829 m)   Wt 137 lb (62.1 kg)   SpO2 99%   BMI 18.58 kg/m   Physical Exam  Constitutional: He appears well-developed and well-nourished.  HENT:  Head: Normocephalic and atraumatic.  Nose: Nose normal.  Mouth/Throat: Oropharynx is clear and moist.  Eyes: Conjunctivae are normal.  Neck: Neck supple.  Cardiovascular: Normal rate and regular rhythm.   No murmur heard. Pulmonary/Chest: Effort normal and breath sounds normal. No respiratory distress.  Abdominal: Soft. There is no tenderness.  Genitourinary:  Genitourinary Comments: Patient has indwelling Foley catheter. Bag has clear, yellow urine present.  Musculoskeletal: He exhibits edema.  Patient has trace pitting edema bilateral lower extremities.  Neurological: He is alert. No  cranial nerve deficit. He exhibits normal muscle tone. Coordination normal.  Skin: Skin is warm and dry.  Psychiatric: He has a normal mood and affect.  Nursing note and vitals reviewed.    ED Treatments / Results  Labs (all labs ordered are listed, but only abnormal results are displayed) Labs Reviewed  COMPREHENSIVE METABOLIC PANEL - Abnormal; Notable for the following:       Result Value   BUN 28 (*)    Creatinine, Ser 1.87 (*)    Total Protein 8.8 (*)    ALT 12 (*)    GFR calc non Af Amer 30 (*)    GFR calc Af Amer 35 (*)    Anion gap 4 (*)    All other components within normal limits  CBC WITH DIFFERENTIAL/PLATELET - Abnormal; Notable for the following:    RBC 4.16 (*)    Hemoglobin 11.8 (*)    HCT 36.3 (*)    All other components within normal limits  I-STAT CHEM 8, ED - Abnormal; Notable for the following:    Potassium 5.8 (*)    BUN 34 (*)    Creatinine, Ser 1.70 (*)    All other components within normal limits  LIPASE, BLOOD  TROPONIN I  I-STAT TROPOININ, ED    EKG  EKG  Interpretation  Date/Time:  Wednesday August 25 2015 12:40:34 EDT Ventricular Rate:  64 PR Interval:    QRS Duration: 113 QT Interval:  427 QTC Calculation: 441 R Axis:   -45 Text Interpretation:  Sinus rhythm Borderline IVCD with LAD Probable anteroseptal infarct, old agree. no sig changefrom previous. Confirmed by Donnald GarrePfeiffer, MD, Lebron ConnersMarcy [phone]) on 08/25/2015 1:04:14 PM       Radiology No results found.  Procedures Procedures (including critical care time)  Medications Ordered in ED Medications  sodium chloride 0.9 % bolus 500 mL (0 mLs Intravenous Stopped 08/25/15 1611)     Initial Impression / Assessment and Plan / ED Course  I have reviewed the triage vital signs and the nursing notes.  Pertinent labs & imaging results that were available during my care of the patient were reviewed by me and considered in my medical decision making (see chart for details).  Clinical Course    Final Clinical Impressions(s) / ED Diagnoses   Final diagnoses:  AKI (acute kidney injury) (HCC)  Hypotension, unspecified   After hydration, patient continues to have elevation in creatinine which is double his baseline. Also some elevation of potassium was rechecked. At this time he continues to show acute kidney injury. Patient's blood pressures have improved with hydration and stabilized. Not had chest pain. Patient be placed in observation for continued monitoring of blood pressures and fluid replacement with acute kidney injury and mild hyperkalemia. New Prescriptions New Prescriptions   No medications on file     Arby BarretteMarcy Ndia Sampath, MD 08/25/15 [phone]

## 2015-08-25 NOTE — ED Notes (Signed)
MD at bedside. 

## 2015-08-25 NOTE — ED Triage Notes (Signed)
Per EMS, patient complaining of nausea/vomiting. Denies abdominal pain/chest pain/diarrhea. Home health nurse was getting systolic pressure of 50. Patient has had hypotension previously due to his blood pressure medication. Patient has been started on a new blood pressure medication. Patient's initial blood pressure with EMS was 72/50.  Patient given 500 mL NS IV and 4 mg zofran en route. Patient's last blood pressure with EMS was 110/80. Hx of dementia. Patient is coming from home.

## 2015-08-25 NOTE — ED Notes (Signed)
Re called 4E. 20 minute timer started.

## 2015-08-25 NOTE — ED Notes (Addendum)
Called charge at 18:45 and was told she was doing admission and would call back .Delay due to charge nurse doing admission and said she needed to call us back.

## 2015-08-26 ENCOUNTER — Other Ambulatory Visit: Payer: Self-pay | Admitting: *Deleted

## 2015-08-26 DIAGNOSIS — R112 Nausea with vomiting, unspecified: Secondary | ICD-10-CM | POA: Diagnosis not present

## 2015-08-26 DIAGNOSIS — N179 Acute kidney failure, unspecified: Secondary | ICD-10-CM | POA: Diagnosis not present

## 2015-08-26 DIAGNOSIS — R55 Syncope and collapse: Secondary | ICD-10-CM | POA: Diagnosis not present

## 2015-08-26 DIAGNOSIS — E875 Hyperkalemia: Secondary | ICD-10-CM | POA: Diagnosis not present

## 2015-08-26 LAB — BASIC METABOLIC PANEL
ANION GAP: 7 (ref 5–15)
BUN: 21 mg/dL — AB (ref 6–20)
CALCIUM: 9.3 mg/dL (ref 8.9–10.3)
CHLORIDE: 110 mmol/L (ref 101–111)
CO2: 21 mmol/L — ABNORMAL LOW (ref 22–32)
CREATININE: 1.27 mg/dL — AB (ref 0.61–1.24)
GFR, EST AFRICAN AMERICAN: 56 mL/min — AB (ref >60)
GFR, EST NON AFRICAN AMERICAN: 49 mL/min — AB (ref >60)
Glucose, Bld: 82 mg/dL (ref 65–99)
POTASSIUM: 4.8 mmol/L (ref 3.5–5.1)
SODIUM: 138 mmol/L (ref 135–145)

## 2015-08-26 LAB — CBC
HCT: 39.4 % (ref 39.0–52.0)
HEMOGLOBIN: 13 g/dL (ref 13.0–17.0)
MCH: 28.4 pg (ref 26.0–34.0)
MCHC: 33 g/dL (ref 30.0–36.0)
MCV: 86.2 fL (ref 78.0–100.0)
PLATELETS: 185 10*3/uL (ref 150–400)
RBC: 4.57 MIL/uL (ref 4.22–5.81)
RDW: 14.9 % (ref 11.5–15.5)
WBC: 6.9 10*3/uL (ref 4.0–10.5)

## 2015-08-26 MED ORDER — ENOXAPARIN SODIUM 40 MG/0.4ML ~~LOC~~ SOLN: 40.0000 mg | SUBCUTANEOUS | Status: DC

## 2015-08-26 NOTE — Care Management Note (Signed)
Case Management Note  Patient Details  Name: Jeremy [name] MRN: [id number] Date of Birth: [birth year]  Subjective/Objective:  82 y/o m admitted w/near syncope. From home.Hx: Legally blind. Active w/[facility]-HHPT.TC rep Tim aware of patient's choice-HHPT ordered,& d/c order.                  Action/Plan:d/c home w/HHC.   Expected Discharge Date:   (unknown)               Expected Discharge Plan:  Home w Home Health Services  In-House Referral:     Discharge planning Services  CM Consult  Post Acute Care Choice:   (Active w/[facility]-HHPT) Choice offered to:  Patient  DME Arranged:    DME Agency:     HH Arranged:  PT [facility]  Status of Service:  Completed, signed off  If discussed at [employer] Length of Stay Meetings, dates discussed:    Additional Comments:  Lanier ClamMahabir, Costas Sena, RN 08/26/2015, 11:18 AM

## 2015-08-26 NOTE — Care Management Obs Status (Signed)
MEDICARE OBSERVATION STATUS NOTIFICATION   Patient Details  Name: Pershing CoxJames T Head MRN: [id number] Date of Birth: 1925/12/01   Medicare Observation Status Notification Given:  Yes    MahabirOlegario Messier, Breckin Savannah, RN 08/26/2015, 11:16 AM

## 2015-08-26 NOTE — Progress Notes (Signed)
Completed D/C teaching with patient and family. Answered questions. Patient will be D/C home with family in stable condition.

## 2015-08-26 NOTE — Discharge Instructions (Signed)
Near-Syncope Near-syncope (commonly known as near fainting) is sudden weakness, dizziness, or feeling like you might pass out. This can happen when getting up or while standing for a long time. It is caused by a sudden decrease in blood flow to the brain, which can occur for various reasons. Most of the reasons are not serious.  HOME CARE Watch your condition for any changes.  Have someone stay with you until you feel stable.  If you feel like you are going to pass out:  Lie down right away.  Prop your feet up if you can.  Breathe deeply and steadily.  Move only when the feeling has gone away. Most of the time, this feeling lasts only a few minutes. You may feel tired for several hours.  Drink enough fluids to keep your pee (urine) clear or pale yellow.  If you are taking blood pressure or heart medicine, stand up slowly.  Follow up with your doctor as told. GET HELP RIGHT AWAY IF:   You have a severe headache.  You have unusual pain in the chest, belly (abdomen), or back.  You have bleeding from the mouth or butt (rectum), or you have black or tarry poop (stool).  You feel your heart beat differently than normal, or you have a very fast pulse.  You pass out, or you twitch and shake when you pass out.  You pass out when sitting or lying down.  You feel confused.  You have trouble walking.  You are weak.  You have vision problems. MAKE SURE YOU:   Understand these instructions.  Will watch your condition.  Will get help right away if you are not doing well or get worse.   This information is not intended to replace advice given to you by your health care provider. Make sure you discuss any questions you have with your health care provider.   Document Released: 06/07/2007 Document Revised: 01/09/2014 Document Reviewed: 05/24/2012 Elsevier Interactive Patient Education 2016 Elsevier Inc.  

## 2015-08-26 NOTE — Discharge Summary (Signed)
Physician Discharge Summary  JAMAREE HOSIER [name]:[id number] DOB: 20-Nov-1925 DOA: 08/25/2015  PCP: [name]  Admit date: 08/25/2015 Discharge date: 08/26/2015  Admitted From: Home Disposition:  Home  Recommendations for Outpatient Follow-up:  1. Follow up with PCP in 1-2 weeks.Resume home health PT  Home Health: PT Equipment/Devices: Encouraged to use walker all the time  Discharge Condition: Stable CODE STATUS: Full code Diet recommendation: Heart healthy    Discharge Diagnoses:  Principal Problem:   Vasovagal near syncope  Active Problems:   Hypothyroidism   CKD (chronic kidney disease) stage 3, GFR 30-59 ml/min   Coronary artery disease involving native coronary artery of native heart without angina pectoris   Acute kidney injury superimposed on chronic kidney disease (HCC)   Hypotension   Nausea & vomiting   Hyperkalemia  Brief narrative/history of present illness Please refer to admission H&P for details, in brief, 82 year old male with history of multiple hospitalization for syncope with hypotension, recurrent UTIs, chronic indwelling Foley, hypothyroidism, CAD and mild dementia presented with an acute episode of nausea and vomiting with near syncope while at home. Patient reports getting ready for his doctor's appointment when he had this episode. His nurse recorded his blood pressure and was in the 70s. EMS was called who checks his blood pressure which was again in the 70s. He was given IV fluids and brought to the ED. By the time he came to the ED his blood pressure was normal. Blood work showed acute kidney injury with creatinine of 1.87. Orthostasis was negative. Patient admitted to hospitalist service on observation.  Near syncope Suspect vasovagal with associated nausea and vomiting. Also had hypotensive episode. Patient had multiple similar symptoms in the past with hospitalization until early this year. Symptoms had resolved after stopping all his blood  pressure medications and starting him on Florinef and midodrine in April this year. Patient given IV hydration with improvement in symptoms. Blood pressure has remained on higher side. No signs or symptoms of infection. Patient stable to be discharged home with outpatient PCP follow-up. Resume home health PT upon discharge.  Acute on chronic kidney disease stage III Suspect due to dehydration. Improved with IV fluids.  Hyperkalemia Stable on cardiac monitor. Resolved on subsequent labs.  Coronary artery disease Continue aspirin and statin.  Hypothyroidism Continue Synthroid  Family communication: None at bedside  disposition: Home with home health PT  Discharge Instructions     Medication List    TAKE these medications   aspirin EC 81 MG tablet Take 81 mg by mouth every morning.   clopidogrel 75 MG tablet Commonly known as:  PLAVIX Take 75 mg by mouth daily.   CRANBERRY PO Take 1 tablet by mouth every evening.   dorzolamide 2 % ophthalmic solution Commonly known as:  TRUSOPT Place 1 drop into the right eye 2 (two) times daily.   finasteride 5 MG tablet Commonly known as:  PROSCAR Take 5 mg by mouth every evening.   fludrocortisone 0.1 MG tablet Commonly known as:  FLORINEF Take 0.5 tablets (0.05 mg total) by mouth daily. What changed:  when to take this   latanoprost 0.005 % ophthalmic solution Commonly known as:  XALATAN Place 1 drop into both eyes at bedtime.   levothyroxine 75 MCG tablet Commonly known as:  SYNTHROID, LEVOTHROID Take 75 mcg by mouth daily before breakfast.   meloxicam 15 MG tablet Commonly known as:  MOBIC Take 15 mg by mouth every morning.   midodrine 5 MG tablet Commonly known  as:  PROAMATINE Take 1 tablet (5 mg total) by mouth 3 (three) times daily with meals.   OVER THE COUNTER MEDICATION Take 1 tablet by mouth every Monday, Wednesday, and Friday. With lunch.  Drop 1 tablet in water daily and drink- Over the counter [employer]   QUEtiapine 25 MG tablet Commonly known as:  SEROQUEL Take 1 tablet (25 mg total) by mouth at bedtime. For behaviors   ranitidine 75 MG tablet Commonly known as:  ZANTAC Take 75 mg by mouth every morning. Reported on 06/08/2015   simvastatin 40 MG tablet Commonly known as:  ZOCOR Take 40 mg by mouth daily at 6 PM.   timolol 0.5 % ophthalmic solution Commonly known as:  TIMOPTIC Place 1 drop into both eyes 2 (two) times daily.   Vitamin D (Ergocalciferol) 50000 units Caps capsule Commonly known as:  DRISDOL Take 50,000 Units by mouth 2 (two) times a week. Monday and Thursday in the evening      Follow-up Information    Arnette FeltsMoore, [name]. Schedule an appointment as soon as possible for a visit in 2 week(s).   Specialty:  General Practice Contact information: [address] [place] [address] [phone]          Allergies  Allergen Reactions  . Tramadol Nausea And Vomiting    Consultations:  None   Procedures/Studies: Dg Chest 2 View  Result Date: 08/07/2015 CLINICAL DATA:  Confusion, vomiting and fever. EXAM: CHEST  2 VIEW COMPARISON:  Chest x-ray dated 05/03/2015. FINDINGS: Heart size is normal. Overall cardiomediastinal silhouette is stable in size and configuration. Lungs are hyperexpanded. Lungs are clear. No pleural effusion or pneumothorax seen. No acute or suspicious osseous finding. IMPRESSION: No active cardiopulmonary disease.  Probable COPD. Electronically Signed   By: Bary RichardStan  Maynard M.D.   On: 08/07/2015 12:25       Subjective: No further nausea or vomiting. Denies dizziness or headache.  Discharge Exam: Vitals:   08/25/15 1930 08/26/15 0600  BP: 159/79 (!) 173/89  Pulse:  81  Resp:  18  Temp:  98.2 F (36.8 C)   Vitals:   08/25/15 1830 08/25/15 1930 08/26/15 0500 08/26/15 0600  BP: 144/95 159/79  (!) 173/89  Pulse: 74   81  Resp: 15   18  Temp:    98.2 F (36.8 C)  TempSrc:    Oral  SpO2: 99%   99%  Weight:    61.8 kg (136 lb 3.9 oz)   Height:   6' (1.829 m)     General: Elderly pleasant male not in distress HEENT: No pallor, moist mucosa, supple neck Cardiovascular: RRR, S1/S2 +, no rubs, no gallops Respiratory: CTA bilaterally, no wheezing, no rhonchi Abdominal: Soft, NT, ND, bowel sounds + Extremities: Warm, no edema, chronic indwelling Foley CNS: Alert and oriented    The results of significant diagnostics from this hospitalization (including imaging, microbiology, ancillary and laboratory) are listed below for reference.     Microbiology: Recent Results (from the past 240 hour(s))  MRSA PCR Screening     Status: None   Collection Time: 08/25/15  9:42 PM  Result Value Ref Range Status   MRSA by PCR NEGATIVE NEGATIVE Final    Comment:        The GeneXpert MRSA Assay (FDA approved for NASAL specimens only), is one component of a comprehensive MRSA colonization surveillance program. It is not intended to diagnose MRSA infection nor to guide or monitor treatment for MRSA infections.  Labs: BNP (last 3 results) No results for input(s): BNP in the last 8760 hours. Basic Metabolic Panel:  Recent Labs Lab 08/25/15 1307 08/25/15 1619 08/26/15 0545  NA 138 142 138  K 4.4 5.8* 4.8  CL 111 108 110  CO2 23  --  21*  GLUCOSE 96 86 82  BUN 28* 34* 21*  CREATININE 1.87* 1.70* 1.27*  CALCIUM 9.4  --  9.3   Liver Function Tests:  Recent Labs Lab 08/25/15 1307  AST 20  ALT 12*  ALKPHOS 75  BILITOT 0.4  PROT 8.8*  ALBUMIN 4.1    Recent Labs Lab 08/25/15 1307  LIPASE 36   No results for input(s): AMMONIA in the last 168 hours. CBC:  Recent Labs Lab 08/25/15 1307 08/25/15 1619 08/26/15 0545  WBC 8.1  --  6.9  NEUTROABS 5.4  --   --   HGB 11.8* 15.3 13.0  HCT 36.3* 45.0 39.4  MCV 87.3  --  86.2  PLT 186  --  185   Cardiac Enzymes:  Recent Labs Lab 08/25/15 1307  TROPONINI <0.03   BNP: Invalid input(s): POCBNP CBG: No results for input(s):  GLUCAP in the last 168 hours. D-Dimer No results for input(s): DDIMER in the last 72 hours. Hgb A1c No results for input(s): HGBA1C in the last 72 hours. Lipid Profile No results for input(s): CHOL, HDL, LDLCALC, TRIG, CHOLHDL, LDLDIRECT in the last 72 hours. Thyroid function studies No results for input(s): TSH, T4TOTAL, T3FREE, THYROIDAB in the last 72 hours.  Invalid input(s): FREET3 Anemia work up No results for input(s): VITAMINB12, FOLATE, FERRITIN, TIBC, IRON, RETICCTPCT in the last 72 hours. Urinalysis    Component Value Date/Time   COLORURINE YELLOW 08/25/2015 2143   APPEARANCEUR CLOUDY (A) 08/25/2015 2143   LABSPEC 1.013 08/25/2015 2143   PHURINE 6.5 08/25/2015 2143   GLUCOSEU NEGATIVE 08/25/2015 2143   HGBUR MODERATE (A) 08/25/2015 2143   BILIRUBINUR NEGATIVE 08/25/2015 2143   KETONESUR NEGATIVE 08/25/2015 2143   PROTEINUR NEGATIVE 08/25/2015 2143   UROBILINOGEN 0.2 11/08/2014 0334   NITRITE POSITIVE (A) 08/25/2015 2143   LEUKOCYTESUR LARGE (A) 08/25/2015 2143   Sepsis Labs Invalid input(s): PROCALCITONIN,  WBC,  LACTICIDVEN Microbiology Recent Results (from the past 240 hour(s))  MRSA PCR Screening     Status: None   Collection Time: 08/25/15  9:42 PM  Result Value Ref Range Status   MRSA by PCR NEGATIVE NEGATIVE Final    Comment:        The GeneXpert MRSA Assay (FDA approved for NASAL specimens only), is one component of a comprehensive MRSA colonization surveillance program. It is not intended to diagnose MRSA infection nor to guide or monitor treatment for MRSA infections.      Time coordinating discharge: < 30 minutes  SIGNED:   Eddie NorthHUNGEL, Vic Esco, MD  [facility] 08/26/2015, 10:37 AM Pager   If 7PM-7AM, please contact night-coverage [email or web address] Password [email or web address]

## 2015-08-27 ENCOUNTER — Emergency Department (HOSPITAL_COMMUNITY): Payer: Medicare Other

## 2015-08-27 ENCOUNTER — Encounter (HOSPITAL_COMMUNITY): Payer: Self-pay | Admitting: Emergency Medicine

## 2015-08-27 ENCOUNTER — Ambulatory Visit: Payer: Medicare Other | Admitting: Podiatry

## 2015-08-27 ENCOUNTER — Observation Stay (HOSPITAL_COMMUNITY)
Admission: EM | Admit: 2015-08-27 | Discharge: 2015-08-28 | Disposition: A | Discharge status: Home Health | Payer: Medicare Other | Attending: Internal Medicine | Admitting: Internal Medicine

## 2015-08-27 DIAGNOSIS — I5032 Chronic diastolic (congestive) heart failure: Secondary | ICD-10-CM | POA: Diagnosis not present

## 2015-08-27 DIAGNOSIS — Z7902 Long term (current) use of antithrombotics/antiplatelets: Secondary | ICD-10-CM | POA: Insufficient documentation

## 2015-08-27 DIAGNOSIS — E039 Hypothyroidism, unspecified: Secondary | ICD-10-CM | POA: Diagnosis present

## 2015-08-27 DIAGNOSIS — Z79899 Other long term (current) drug therapy: Secondary | ICD-10-CM | POA: Insufficient documentation

## 2015-08-27 DIAGNOSIS — F32A Depression, unspecified: Secondary | ICD-10-CM | POA: Diagnosis present

## 2015-08-27 DIAGNOSIS — Z7982 Long term (current) use of aspirin: Secondary | ICD-10-CM | POA: Diagnosis not present

## 2015-08-27 DIAGNOSIS — E785 Hyperlipidemia, unspecified: Secondary | ICD-10-CM | POA: Insufficient documentation

## 2015-08-27 DIAGNOSIS — I2510 Atherosclerotic heart disease of native coronary artery without angina pectoris: Secondary | ICD-10-CM | POA: Insufficient documentation

## 2015-08-27 DIAGNOSIS — R627 Adult failure to thrive: Secondary | ICD-10-CM | POA: Insufficient documentation

## 2015-08-27 DIAGNOSIS — F0391 Unspecified dementia with behavioral disturbance: Secondary | ICD-10-CM | POA: Insufficient documentation

## 2015-08-27 DIAGNOSIS — K5900 Constipation, unspecified: Secondary | ICD-10-CM | POA: Diagnosis not present

## 2015-08-27 DIAGNOSIS — F329 Major depressive disorder, single episode, unspecified: Secondary | ICD-10-CM | POA: Insufficient documentation

## 2015-08-27 DIAGNOSIS — R55 Syncope and collapse: Secondary | ICD-10-CM | POA: Diagnosis present

## 2015-08-27 DIAGNOSIS — Z87891 Personal history of nicotine dependence: Secondary | ICD-10-CM | POA: Diagnosis not present

## 2015-08-27 DIAGNOSIS — R5381 Other malaise: Secondary | ICD-10-CM | POA: Diagnosis not present

## 2015-08-27 DIAGNOSIS — N179 Acute kidney failure, unspecified: Secondary | ICD-10-CM | POA: Diagnosis present

## 2015-08-27 DIAGNOSIS — I951 Orthostatic hypotension: Principal | ICD-10-CM | POA: Insufficient documentation

## 2015-08-27 DIAGNOSIS — I130 Hypertensive heart and chronic kidney disease with heart failure and stage 1 through stage 4 chronic kidney disease, or unspecified chronic kidney disease: Secondary | ICD-10-CM | POA: Diagnosis not present

## 2015-08-27 DIAGNOSIS — I959 Hypotension, unspecified: Secondary | ICD-10-CM | POA: Diagnosis present

## 2015-08-27 LAB — COMPREHENSIVE METABOLIC PANEL
ALBUMIN: 4 g/dL (ref 3.5–5.0)
ALK PHOS: 76 U/L (ref 38–126)
ALT: 12 U/L — AB (ref 17–63)
AST: 24 U/L (ref 15–41)
Anion gap: 8 (ref 5–15)
BUN: 21 mg/dL — ABNORMAL HIGH (ref 6–20)
CALCIUM: 10 mg/dL (ref 8.9–10.3)
CO2: 21 mmol/L — ABNORMAL LOW (ref 22–32)
CREATININE: 1.53 mg/dL — AB (ref 0.61–1.24)
Chloride: 108 mmol/L (ref 101–111)
GFR, EST AFRICAN AMERICAN: 45 mL/min — AB (ref >60)
GFR, EST NON AFRICAN AMERICAN: 39 mL/min — AB (ref >60)
Glucose, Bld: 108 mg/dL — ABNORMAL HIGH (ref 65–99)
Potassium: 4.3 mmol/L (ref 3.5–5.1)
SODIUM: 137 mmol/L (ref 135–145)
TOTAL PROTEIN: 9.1 g/dL — AB (ref 6.5–8.1)
Total Bilirubin: 0.7 mg/dL (ref 0.3–1.2)

## 2015-08-27 LAB — URINALYSIS, ROUTINE W REFLEX MICROSCOPIC
Bilirubin Urine: NEGATIVE
Glucose, UA: NEGATIVE mg/dL
Ketones, ur: NEGATIVE mg/dL
NITRITE: POSITIVE — AB
PH: 7 (ref 5.0–8.0)
Protein, ur: 30 mg/dL — AB
SPECIFIC GRAVITY, URINE: 1.011 (ref 1.005–1.030)

## 2015-08-27 LAB — CBC WITH DIFFERENTIAL/PLATELET
BASOS ABS: 0 10*3/uL (ref 0.0–0.1)
BASOS PCT: 0 %
EOS ABS: 0.3 10*3/uL (ref 0.0–0.7)
EOS PCT: 3 %
HCT: 41.8 % (ref 39.0–52.0)
Hemoglobin: 13.8 g/dL (ref 13.0–17.0)
LYMPHS ABS: 1.4 10*3/uL (ref 0.7–4.0)
Lymphocytes Relative: 18 %
MCH: 28.9 pg (ref 26.0–34.0)
MCHC: 33 g/dL (ref 30.0–36.0)
MCV: 87.4 fL (ref 78.0–100.0)
Monocytes Absolute: 0.5 10*3/uL (ref 0.1–1.0)
Monocytes Relative: 6 %
Neutro Abs: 5.4 10*3/uL (ref 1.7–7.7)
Neutrophils Relative %: 73 %
PLATELETS: 179 10*3/uL (ref 150–400)
RBC: 4.78 MIL/uL (ref 4.22–5.81)
RDW: 14.7 % (ref 11.5–15.5)
WBC: 7.6 10*3/uL (ref 4.0–10.5)

## 2015-08-27 LAB — URINE MICROSCOPIC-ADD ON

## 2015-08-27 LAB — I-STAT TROPONIN, ED: Troponin i, poc: 0.01 ng/mL (ref 0.00–0.08)

## 2015-08-27 MED ORDER — TIMOLOL MALEATE 0.5 % OP SOLN
1.0000 [drp] | Freq: Two times a day (BID) | OPHTHALMIC | Status: DC
  Administered 2015-08-27 – 2015-08-28 (×2): 1 [drp] via OPHTHALMIC
  Filled 2015-08-27: qty 5

## 2015-08-27 MED ORDER — QUETIAPINE FUMARATE 25 MG PO TABS
25.0000 mg | ORAL_TABLET | Freq: Every day | ORAL | Status: DC
  Administered 2015-08-27: 25 mg via ORAL
  Filled 2015-08-27: qty 1

## 2015-08-27 MED ORDER — VITAMIN D (ERGOCALCIFEROL) 1.25 MG (50000 UNIT) PO CAPS: 50000.0000 [IU] | ORAL_CAPSULE | ORAL | Status: DC

## 2015-08-27 MED ORDER — ONDANSETRON HCL 4 MG PO TABS
4.0000 mg | ORAL_TABLET | Freq: Four times a day (QID) | ORAL | Status: DC | PRN
Start: 2015-08-27 — End: 2015-08-28

## 2015-08-27 MED ORDER — SODIUM CHLORIDE 0.9 % IV SOLN
INTRAVENOUS | Status: AC
  Administered 2015-08-27: 19:00:00 via INTRAVENOUS

## 2015-08-27 MED ORDER — ASPIRIN EC 81 MG PO TBEC
81.0000 mg | DELAYED_RELEASE_TABLET | Freq: Every day | ORAL | Status: DC
  Administered 2015-08-28: 81 mg via ORAL
  Filled 2015-08-27: qty 1

## 2015-08-27 MED ORDER — LEVOTHYROXINE SODIUM 75 MCG PO TABS
75.0000 ug | ORAL_TABLET | Freq: Every day | ORAL | Status: DC
  Administered 2015-08-28: 75 ug via ORAL
  Filled 2015-08-27: qty 1

## 2015-08-27 MED ORDER — ASPIRIN EC 81 MG PO TBEC: 81.0000 mg | DELAYED_RELEASE_TABLET | Freq: Every day | ORAL | Status: DC

## 2015-08-27 MED ORDER — SODIUM CHLORIDE 0.9 % IV BOLUS (SEPSIS)
500.0000 mL | Freq: Once | INTRAVENOUS | Status: AC
  Administered 2015-08-27: 500 mL via INTRAVENOUS

## 2015-08-27 MED ORDER — SENNA 8.6 MG PO TABS
1.0000 | ORAL_TABLET | Freq: Two times a day (BID) | ORAL | Status: DC
  Administered 2015-08-27 – 2015-08-28 (×2): 8.6 mg via ORAL
  Filled 2015-08-27 (×2): qty 1

## 2015-08-27 MED ORDER — HEPARIN SODIUM (PORCINE) 5000 UNIT/ML IJ SOLN
5000.0000 [IU] | Freq: Three times a day (TID) | INTRAMUSCULAR | Status: DC
  Administered 2015-08-27 – 2015-08-28 (×2): 5000 [IU] via SUBCUTANEOUS
  Filled 2015-08-27 (×2): qty 1

## 2015-08-27 MED ORDER — SODIUM CHLORIDE 0.9% FLUSH
3.0000 mL | Freq: Two times a day (BID) | INTRAVENOUS | Status: DC
  Administered 2015-08-27 – 2015-08-28 (×2): 3 mL via INTRAVENOUS

## 2015-08-27 MED ORDER — ONDANSETRON HCL 4 MG/2ML IJ SOLN: 4.0000 mg | Freq: Four times a day (QID) | INTRAMUSCULAR | Status: DC | PRN

## 2015-08-27 MED ORDER — FAMOTIDINE 20 MG PO TABS
20.0000 mg | ORAL_TABLET | Freq: Every day | ORAL | Status: DC
  Administered 2015-08-28: 20 mg via ORAL
  Filled 2015-08-27: qty 1

## 2015-08-27 MED ORDER — ACETAMINOPHEN 325 MG PO TABS: 650.0000 mg | ORAL_TABLET | Freq: Four times a day (QID) | ORAL | Status: DC | PRN

## 2015-08-27 MED ORDER — DORZOLAMIDE HCL 2 % OP SOLN
1.0000 [drp] | Freq: Two times a day (BID) | OPHTHALMIC | Status: DC
  Administered 2015-08-27 – 2015-08-28 (×2): 1 [drp] via OPHTHALMIC
  Filled 2015-08-27: qty 10

## 2015-08-27 MED ORDER — FLUDROCORTISONE ACETATE 0.1 MG PO TABS
0.0500 mg | ORAL_TABLET | Freq: Every day | ORAL | Status: DC
  Administered 2015-08-27 – 2015-08-28 (×2): 0.05 mg via ORAL
  Filled 2015-08-27 (×2): qty 0.5

## 2015-08-27 MED ORDER — CLOPIDOGREL BISULFATE 75 MG PO TABS
75.0000 mg | ORAL_TABLET | Freq: Every day | ORAL | Status: DC
  Administered 2015-08-28: 75 mg via ORAL
  Filled 2015-08-27: qty 1

## 2015-08-27 MED ORDER — LATANOPROST 0.005 % OP SOLN
1.0000 [drp] | Freq: Every day | OPHTHALMIC | Status: DC
  Administered 2015-08-27: 1 [drp] via OPHTHALMIC
  Filled 2015-08-27: qty 2.5

## 2015-08-27 MED ORDER — ALUM & MAG HYDROXIDE-SIMETH 200-200-20 MG/5ML PO SUSP: 30.0000 mL | Freq: Four times a day (QID) | ORAL | Status: DC | PRN

## 2015-08-27 MED ORDER — POLYETHYLENE GLYCOL 3350 17 G PO PACK
17.0000 g | PACK | Freq: Once | ORAL | Status: AC
  Administered 2015-08-27: 17 g via ORAL
  Filled 2015-08-27: qty 1

## 2015-08-27 MED ORDER — MIDODRINE HCL 5 MG PO TABS
5.0000 mg | ORAL_TABLET | Freq: Three times a day (TID) | ORAL | Status: DC
  Administered 2015-08-28 (×2): 5 mg via ORAL
  Filled 2015-08-27 (×3): qty 1

## 2015-08-27 MED ORDER — ACETAMINOPHEN 650 MG RE SUPP: 650.0000 mg | Freq: Four times a day (QID) | RECTAL | Status: DC | PRN

## 2015-08-27 MED ORDER — SIMVASTATIN 40 MG PO TABS
40.0000 mg | ORAL_TABLET | Freq: Every day | ORAL | Status: DC
  Administered 2015-08-27: 40 mg via ORAL
  Filled 2015-08-27: qty 1

## 2015-08-27 NOTE — ED Provider Notes (Signed)
[facility]-EMERGENCY DEPT Provider Note   CSN: [id number] Arrival date & time: 08/27/15  1146     History   Chief Complaint Chief Complaint  Patient presents with  . Loss of Consciousness    HPI MIKYLE SOX is a 82 y.o. male.  HPI Patient presents with witnessed syncopal episode while at podiatrist's office. States he became nauseated prior to onset of syncope. No trauma. Recently admitted to the hospital for vasovagal syncope and discharged yesterday. States he's not had a bowel movement for several weeks and has intermittent abdominal spasms. He thinks may be related to his syncope. Denies any chest pain. No shortness of breath. No recent fever or chills. Past Medical History:  Diagnosis Date  . CAD (coronary artery disease)   . Dementia   . HTN (hypertension)   . Hyperlipidemia   . Hypotension   . Hypothyroidism    s/p Thyroidectomy, partial  . Sepsis (HCC)   . Syncope    4/11 (hospitalized) and 4/13  . Syncope   . UTI (lower urinary tract infection)     Patient Active Problem List   Diagnosis Date Noted  . Syncope 08/27/2015  . Hyperkalemia 08/26/2015  . Hypotension 08/25/2015  . Vasovagal near syncope 08/25/2015  . Nausea & vomiting 08/25/2015  . SIRS (systemic inflammatory response syndrome) (HCC) 06/08/2015  . Orthostatic hypotension 06/01/2015  . Confusion   . Urinary tract infectious disease   . UTI (urinary tract infection) due to urinary indwelling catheter (HCC) 04/19/2015  . Pressure ulcer 04/19/2015  . Syncope and collapse 04/15/2015  . Syncopal episodes 04/15/2015  . Acute kidney injury superimposed on chronic kidney disease (HCC) 04/14/2015  . Abnormality of gait 02/25/2015  . Palliative care encounter 12/10/2014  . Protein-calorie malnutrition, severe 12/06/2014  . Aspiration pneumonia of right lower lobe due to regurgitated food (HCC) 12/06/2014  . CKD (chronic kidney disease) stage 3, GFR 30-59 ml/min 12/06/2014  . Anemia of chronic renal  failure, stage 3 (moderate) 12/06/2014  . Dyslipidemia 12/06/2014  . Coronary artery disease involving native coronary artery of native heart without angina pectoris 12/06/2014  . BPH (benign prostatic hyperplasia) 12/06/2014  . Chronic diastolic congestive heart failure (HCC) 12/06/2014  . Depression 12/06/2014  . HCAP (healthcare-associated pneumonia) 12/05/2014  . Dementia with behavioral disturbance 11/23/2014  . Acute encephalopathy 11/06/2014  . UTI (lower urinary tract infection) 11/06/2014  . Hypothyroidism 01/26/2014    Past Surgical History:  Procedure Laterality Date  . BACK SURGERY    . CARDIAC CATHETERIZATION  02/25/2002   Proximal L Circumflex 95% lesion, stented w/ 3x18-mm CYPHER stent avoiding the 2 L Marginal branch, jailing the 1st marginal, resulting in reduction of a 90-95% lesion to 0% residual  . CARDIOVASCULAR STRESS TEST  12/18/2006   EKG negative for ischemia, no significant ischemia demonstrated.  . CORONARY ANGIOPLASTY WITH STENT PLACEMENT    . RENAL DOPPLER  08/17/2005   Normal evaluation  . THYROIDECTOMY, PARTIAL    . TRANSTHORACIC ECHOCARDIOGRAM  03/13/2002   EF >55%, moderate LVH,        Home Medications    Prior to Admission medications   Medication Sig Start Date End Date Taking? Authorizing Provider  aspirin EC 81 MG tablet Take 81 mg by mouth every morning.     Historical Provider, MD  clopidogrel (PLAVIX) 75 MG tablet Take 75 mg by mouth daily.    Historical Provider, MD  CRANBERRY PO Take 1 tablet by mouth every evening.     Historical Provider,  MD  dorzolamide (TRUSOPT) 2 % ophthalmic solution Place 1 drop into the right eye 2 (two) times daily.  08/23/12   Historical Provider, MD  finasteride (PROSCAR) 5 MG tablet Take 5 mg by mouth every evening.     Historical Provider, MD  fludrocortisone (FLORINEF) 0.1 MG tablet Take 0.5 tablets (0.05 mg total) by mouth daily. Patient taking differently: Take 0.05 mg by mouth every morning.  04/23/15    Costin Otelia Sergeant, MD  latanoprost (XALATAN) 0.005 % ophthalmic solution Place 1 drop into both eyes at bedtime.    Historical Provider, MD  levothyroxine (SYNTHROID, LEVOTHROID) 75 MCG tablet Take 75 mcg by mouth daily before breakfast.  11/06/14   Historical Provider, MD  meloxicam (MOBIC) 15 MG tablet Take 15 mg by mouth every morning.  02/17/15   Historical Provider, MD  midodrine (PROAMATINE) 5 MG tablet Take 1 tablet (5 mg total) by mouth 3 (three) times daily with meals. 05/04/15   Dwana Melena, PA-C  OVER THE COUNTER MEDICATION Take 1 tablet by mouth every Monday, Wednesday, and Friday. With lunch.  Drop 1 tablet in water daily and drink- Over the counter [employer]    Historical Provider, MD  QUEtiapine (SEROQUEL) 25 MG tablet Take 1 tablet (25 mg total) by mouth at bedtime. For behaviors 02/25/15   Levert Feinstein, MD  ranitidine (ZANTAC) 75 MG tablet Take 75 mg by mouth every morning. Reported on 06/08/2015    Historical Provider, MD  simvastatin (ZOCOR) 40 MG tablet Take 40 mg by mouth daily at 6 PM.     Historical Provider, MD  timolol (TIMOPTIC) 0.5 % ophthalmic solution Place 1 drop into both eyes 2 (two) times daily.  09/18/12   Historical Provider, MD  Vitamin D, Ergocalciferol, (DRISDOL) 50000 units CAPS capsule Take 50,000 Units by mouth 2 (two) times a week. Monday and Thursday in the evening    Historical Provider, MD    Family History Family History  Problem Relation Age of Onset  . Family history unknown: Yes    Social History Social History  Substance Use Topics  . Smoking status: Former Games developer  . Smokeless tobacco: Never Used     Comment: Quit 50+ years ago.  . Alcohol use No     Allergies   Tramadol   Review of Systems Review of Systems  Constitutional: Negative for chills and fever.  Respiratory: Negative for cough and shortness of breath.   Cardiovascular: Negative for chest pain.  Gastrointestinal: Positive for abdominal pain, constipation and nausea. Negative  for diarrhea and vomiting.  Genitourinary: Negative for dysuria, flank pain and frequency.  Musculoskeletal: Negative for arthralgias, back pain and myalgias.  Neurological: Positive for dizziness, syncope and light-headedness. Negative for seizures, weakness, numbness and headaches.  All other systems reviewed and are negative.    Physical Exam Updated Vital Signs BP 140/82 (BP Location: Right Arm)   Pulse 74   Temp 97.5 F (36.4 C) (Oral)   Resp 12   SpO2 100%   Physical Exam  Constitutional: He is oriented to person, place, and time. He appears well-developed and well-nourished.  HENT:  Head: Normocephalic and atraumatic.  Mouth/Throat: Oropharynx is clear and moist.  No evidence of any head injury.  Eyes: EOM are normal.  Irregular right pupil  Neck: Normal range of motion. Neck supple. No JVD present.  Cardiovascular: Normal rate and regular rhythm.  Exam reveals no gallop and no friction rub.   No murmur heard. Pulmonary/Chest: Effort normal and  breath sounds normal. No respiratory distress. He has no wheezes. He has no rales. He exhibits no tenderness.  Abdominal: Soft. Bowel sounds are normal. He exhibits distension. There is no tenderness. There is no rebound and no guarding.  Mildly distended abdomen  Musculoskeletal: Normal range of motion. He exhibits no edema or tenderness.  No lower extremity swelling, asymmetry or tenderness. 2+ distal pulses  Neurological: He is alert and oriented to person, place, and time.  5/5 motor in all extremities. Sensation is fully intact.  Skin: Skin is warm and dry. Capillary refill takes less than 2 seconds. No rash noted. No erythema.  Psychiatric: He has a normal mood and affect. His behavior is normal.  Nursing note and vitals reviewed.    ED Treatments / Results  Labs (all labs ordered are listed, but only abnormal results are displayed) Labs Reviewed  URINALYSIS, ROUTINE W REFLEX MICROSCOPIC (NOT AT [facility]) - Abnormal;  Notable for the following:       Result Value   APPearance CLOUDY (*)    Hgb urine dipstick SMALL (*)    Protein, ur 30 (*)    Nitrite POSITIVE (*)    Leukocytes, UA LARGE (*)    All other components within normal limits  URINE MICROSCOPIC-ADD ON - Abnormal; Notable for the following:    Squamous Epithelial / LPF 0-5 (*)    Bacteria, UA MANY (*)    All other components within normal limits  COMPREHENSIVE METABOLIC PANEL - Abnormal; Notable for the following:    CO2 21 (*)    Glucose, Bld 108 (*)    BUN 21 (*)    Creatinine, Ser 1.53 (*)    Total Protein 9.1 (*)    ALT 12 (*)    GFR calc non Af Amer 39 (*)    GFR calc Af Amer 45 (*)    All other components within normal limits  URINE CULTURE  CBC WITH DIFFERENTIAL/PLATELET  CBC WITH DIFFERENTIAL/PLATELET  I-STAT TROPOININ, ED    EKG  EKG Interpretation  Date/Time:  Friday August 27 2015 11:49:59 EDT Ventricular Rate:  77 PR Interval:    QRS Duration: 119 QT Interval:  398 QTC Calculation: 451 R Axis:   -44 Text Interpretation:  Sinus rhythm LVH with IVCD, LAD and secondary repol abnrm Anterior ST elevation, probably due to LVH Confirmed by Ranae PalmsYELVERTON  MD, Eloy Fehl ([id number]) on 08/27/2015 12:49:33 PM       Radiology Dg Abd Acute W/chest  Result Date: 08/27/2015 CLINICAL DATA:  Syncopal episode. EXAM: DG ABDOMEN ACUTE W/ 1V CHEST COMPARISON:  Chest x-ray 08/07/2015. FINDINGS: Lungs appear hyperexpanded. The lungs are clear wiithout focal pneumonia, edema, pneumothorax or pleural effusion. Calcified granuloma right apex again noted. The cardiopericardial silhouette is within normal limits for size. The visualized bony structures of the thorax are intact. Upright film shows no evidence for intraperitoneal free air. Supine abdomen shows prominent stool volume throughout the length of the colon with stool filled rectum measuring 9.3 cm in coronal diameter. No gaseous small bowel dilatation suggest small bowel obstruction. Visualized  bony anatomy is unremarkable. Heterotopic ossification in the region of the right lesser trochanter suggests remote trauma. IMPRESSION: 1. Hyperexpansion without acute cardiopulmonary findings. 2. Prominent stool throughout the length of the colon with a dilated stool-filled rectum. Imaging features would be compatible with clinical constipation in the appropriate setting. Electronically Signed   By: Kennith CenterEric  Mansell M.D.   On: 08/27/2015 13:42    Procedures Procedures (including critical care time)  Medications Ordered in ED Medications  sodium chloride 0.9 % bolus 500 mL (not administered)     Initial Impression / Assessment and Plan / ED Course  I have reviewed the triage vital signs and the nursing notes.  Pertinent labs & imaging results that were available during my care of the patient were reviewed by me and considered in my medical decision making (see chart for details).  Clinical Course    [facility] to admit to observation telemetry bed.  Final Clinical Impressions(s) / ED Diagnoses   Final diagnoses:  Syncope, unspecified syncope type  Constipation, unspecified constipation type    New Prescriptions New Prescriptions   No medications on file     Loren Racer, MD 08/27/15 1540

## 2015-08-27 NOTE — ED Triage Notes (Signed)
Patient present today with complaints of a  witnessed syncopal at the MD office. Per EMS patient was talking then became unresponsive. Patient responded to verbal stimuli with EMS. Patient alert and oriented x3 Not oriented to Time.  Patient following commands. Moving all extremities. Per EMS patient did not hit head during event, Patient remained in chair. Patient denies any Chest pain, SOB.

## 2015-08-27 NOTE — H&P (Signed)
HISTORY AND PHYSICAL       PATIENT DETAILS Name: Jeremy [name] Age: 82 y.o. Sex: male Date of Birth: 20-Nov-1925 Admit Date: 08/27/2015 RUE:[id number]:[name], Lolita CramJanece   Patient coming from: Home   CHIEF COMPLAINT:  Syncope  HPI: Jeremy [name] is a 82 y.o. male with medical history significant of dementia, hypertension, CAD, chronic indwelling Foley catheter, numerous syncopal episodes in the past part to be due to either orthostatic hypotension or vasovagal syncope, presents to the ED for evaluation of another syncopal episode that he sustained while he was at his doctor's/podiatrist's office. Note, [facility] similar presentation. Patient was at his podiatrist's office earlier today, where he was sitting down-he then felt "funny" in his stomach, slightly nauseous and then had lightheadedness, following which he briefly syncopized. He was subsequently brought to the emergency room, where he was found to have slight acute kidney injury, I was asked to admit this patient for further evaluation and treatment.  ED Course: In the ED, patient was found to be orthostatic, was given IV fluid. I was asked to admit this patient for further evaluation and treatment  Note: Lives at: Home Mobility: Bedbound/Wheelchair  Chronic Indwelling Foley:yes   REVIEW OF SYSTEMS:  Constitutional:   No  weight loss, night sweats,  Fevers, chills, fatigue.  HEENT:    No headaches, Dysphagia,Tooth/dental problems,Sore throat,  No sneezing, itching, ear ache, nasal congestion, post nasal drip  Cardio-vascular: No chest pain,Orthopnea, PND,lower extremity edema, anasarca, palpitations  GI:  No heartburn, indigestion, abdominal pain, nausea, vomiting, diarrhea, melena or hematochezia  Resp: No shortness of breath, cough, hemoptysis,plueritic chest pain.   Skin:  No rash or lesions.  GU:  No dysuria, change in color of urine, no urgency or  frequency.  No flank pain.  Musculoskeletal: No joint pain or swelling.  No decreased range of motion.  No back pain.  Endocrine: No heat intolerance, no cold intolerance, no polyuria, no polydipsia  Psych: No change in mood or affect.    ALLERGIES:   Allergies  Allergen Reactions  . Tramadol Nausea And Vomiting    PAST MEDICAL HISTORY: Past Medical History:  Diagnosis Date  . CAD (coronary artery disease)   . Dementia   . HTN (hypertension)   . Hyperlipidemia   . Hypotension   . Hypothyroidism    s/p Thyroidectomy, partial  . Sepsis (HCC)   . Syncope    4/11 (hospitalized) and 4/13  . Syncope   . UTI (lower urinary tract infection)     PAST SURGICAL HISTORY: Past Surgical History:  Procedure Laterality Date  . BACK SURGERY    . CARDIAC CATHETERIZATION  02/25/2002   Proximal L Circumflex 95% lesion, stented w/ 3x18-mm CYPHER stent avoiding the 2 L Marginal branch, jailing the 1st marginal, resulting in reduction of a 90-95% lesion to 0% residual  . CARDIOVASCULAR STRESS TEST  12/18/2006   EKG negative for ischemia, no significant ischemia demonstrated.  . CORONARY ANGIOPLASTY WITH STENT PLACEMENT    . RENAL DOPPLER  08/17/2005   Normal evaluation  . THYROIDECTOMY, PARTIAL    . TRANSTHORACIC ECHOCARDIOGRAM  03/13/2002   EF >55%, moderate LVH,     MEDICATIONS AT HOME: Prior to Admission medications   Medication Sig Start Date End Date Taking? Authorizing Provider  aspirin EC 81 MG tablet Take 81 mg by mouth every morning.     Historical Provider, MD  clopidogrel (PLAVIX) 75 MG tablet Take 75 mg by mouth daily.    Historical Provider, MD  CRANBERRY PO Take 1 tablet by mouth every evening.     Historical Provider, MD  dorzolamide (TRUSOPT) 2 % ophthalmic solution Place 1 drop into the right eye 2 (two) times daily.  08/23/12   Historical Provider, MD  finasteride (PROSCAR) 5 MG tablet Take 5 mg by mouth every evening.     Historical Provider, MD  fludrocortisone  (FLORINEF) 0.1 MG tablet Take 0.5 tablets (0.05 mg total) by mouth daily. Patient taking differently: Take 0.05 mg by mouth every morning.  04/23/15   Costin Otelia Sergeant, MD  latanoprost (XALATAN) 0.005 % ophthalmic solution Place 1 drop into both eyes at bedtime.    Historical Provider, MD  levothyroxine (SYNTHROID, LEVOTHROID) 75 MCG tablet Take 75 mcg by mouth daily before breakfast.  11/06/14   Historical Provider, MD  meloxicam (MOBIC) 15 MG tablet Take 15 mg by mouth every morning.  02/17/15   Historical Provider, MD  midodrine (PROAMATINE) 5 MG tablet Take 1 tablet (5 mg total) by mouth 3 (three) times daily with meals. 05/04/15   Dwana Melena, PA-C  OVER THE COUNTER MEDICATION Take 1 tablet by mouth every Monday, Wednesday, and Friday. With lunch.  Drop 1 tablet in water daily and drink- Over the counter [employer]    Historical Provider, MD  QUEtiapine (SEROQUEL) 25 MG tablet Take 1 tablet (25 mg total) by mouth at bedtime. For behaviors 02/25/15   Levert Feinstein, MD  ranitidine (ZANTAC) 75 MG tablet Take 75 mg by mouth every morning. Reported on 06/08/2015    Historical Provider, MD  simvastatin (ZOCOR) 40 MG tablet Take 40 mg by mouth daily at 6 PM.     Historical Provider, MD  timolol (TIMOPTIC) 0.5 % ophthalmic solution Place 1 drop into both eyes 2 (two) times daily.  09/18/12   Historical Provider, MD  Vitamin D, Ergocalciferol, (DRISDOL) 50000 units CAPS capsule Take 50,000 Units by mouth 2 (two) times a week. Monday and Thursday in the evening    Historical Provider, MD    FAMILY HISTORY: Family History  Problem Relation Age of Onset  . Family history unknown: Yes   SOCIAL HISTORY:  reports that he has quit smoking. He has never used smokeless tobacco. He reports that he does not drink alcohol or use drugs.  PHYSICAL EXAM: Blood pressure 160/94, pulse 74, temperature 97.5 F (36.4 C), temperature source Oral, resp. rate 15, SpO2 100 %.  General appearance :Awake, alert, not in any  distress. Speech Clear. Not toxic Looking HEENT: Atraumatic and Normocephalic, pupils equally reactive to light and accomodation Neck: supple, no JVD. No cervical lymphadenopathy.  Chest:Good air entry bilaterally, no added sounds  CVS: S1 S2 regular Abdomen: Bowel sounds present, Non tender and not distended with no gaurding, rigidity or rebound. Extremities: B/L Lower Ext shows no edema, both legs are warm to touch Neurology:  Non focal Psychiatric: Normal judgment and insight.  Skin:No Rash Wounds:N/A  LABS ON ADMISSION:  I have personally reviewed following labs and imaging studies  CBC:  Recent Labs Lab 08/25/15 1307 08/25/15 1619 08/26/15 0545 08/27/15 1422  WBC 8.1  --  6.9 7.6  NEUTROABS 5.4  --   --  5.4  HGB 11.8* 15.3 13.0 13.8  HCT 36.3* 45.0 39.4 41.8  MCV 87.3  --  86.2 87.4  PLT 186  --  185 179    Basic Metabolic Panel:  Recent Labs Lab 08/25/15 1307 08/25/15 1619 08/26/15 0545 08/27/15 1422  NA 138 142 138 137  K 4.4 5.8* 4.8 4.3  CL 111 108 110 108  CO2 23  --  21* 21*  GLUCOSE 96 86 82 108*  BUN 28* 34* 21* 21*  CREATININE 1.87* 1.70* 1.27* 1.53*  CALCIUM 9.4  --  9.3 10.0    GFR: Estimated Creatinine Clearance: 29.2 mL/min (by C-G formula based on SCr of 1.53 mg/dL).  Liver Function Tests:  Recent Labs Lab 08/25/15 1307 08/27/15 1422  AST 20 24  ALT 12* 12*  ALKPHOS 75 76  BILITOT 0.4 0.7  PROT 8.8* 9.1*  ALBUMIN 4.1 4.0    Recent Labs Lab 08/25/15 1307  LIPASE 36   No results for input(s): AMMONIA in the last 168 hours.  Coagulation Profile: No results for input(s): INR, PROTIME in the last 168 hours.  Cardiac Enzymes:  Recent Labs Lab 08/25/15 1307  TROPONINI <0.03    BNP (last 3 results) No results for input(s): PROBNP in the last 8760 hours.  HbA1C: No results for input(s): HGBA1C in the last 72 hours.  CBG: No results for input(s): GLUCAP in the last 168 hours.  Lipid Profile: No results for  input(s): CHOL, HDL, LDLCALC, TRIG, CHOLHDL, LDLDIRECT in the last 72 hours.  Thyroid Function Tests: No results for input(s): TSH, T4TOTAL, FREET4, T3FREE, THYROIDAB in the last 72 hours.  Anemia Panel: No results for input(s): VITAMINB12, FOLATE, FERRITIN, TIBC, IRON, RETICCTPCT in the last 72 hours.  Urine analysis:    Component Value Date/Time   COLORURINE YELLOW 08/27/2015 1312   APPEARANCEUR CLOUDY (A) 08/27/2015 1312   LABSPEC 1.011 08/27/2015 1312   PHURINE 7.0 08/27/2015 1312   GLUCOSEU NEGATIVE 08/27/2015 1312   HGBUR SMALL (A) 08/27/2015 1312   BILIRUBINUR NEGATIVE 08/27/2015 1312   KETONESUR NEGATIVE 08/27/2015 1312   PROTEINUR 30 (A) 08/27/2015 1312   UROBILINOGEN 0.2 11/08/2014 0334   NITRITE POSITIVE (A) 08/27/2015 1312   LEUKOCYTESUR LARGE (A) 08/27/2015 1312    Sepsis Labs: Lactic Acid, Venous    Component Value Date/Time   LATICACIDVEN 2.0 06/08/2015 1730     Microbiology: Recent Results (from the past 240 hour(s))  MRSA PCR Screening     Status: None   Collection Time: 08/25/15  9:42 PM  Result Value Ref Range Status   MRSA by PCR NEGATIVE NEGATIVE Final    Comment:        The GeneXpert MRSA Assay (FDA approved for NASAL specimens only), is one component of a comprehensive MRSA colonization surveillance program. It is not intended to diagnose MRSA infection nor to guide or monitor treatment for MRSA infections.       RADIOLOGIC STUDIES ON ADMISSION: Dg Abd Acute W/chest  Result Date: 08/27/2015 CLINICAL DATA:  Syncopal episode. EXAM: DG ABDOMEN ACUTE W/ 1V CHEST COMPARISON:  Chest x-ray 08/07/2015. FINDINGS: Lungs appear hyperexpanded. The lungs are clear wiithout focal pneumonia, edema, pneumothorax or pleural effusion. Calcified granuloma right apex again noted. The cardiopericardial silhouette is within normal limits for size. The visualized bony structures of the thorax are intact. Upright film shows no evidence for intraperitoneal free  air. Supine abdomen shows prominent stool volume throughout the length of the colon with stool filled rectum measuring 9.3 cm in coronal diameter. No gaseous small bowel dilatation suggest small bowel obstruction. Visualized bony anatomy is unremarkable. Heterotopic ossification in the region of the right lesser trochanter suggests remote trauma. IMPRESSION: 1. Hyperexpansion without acute cardiopulmonary  findings. 2. Prominent stool throughout the length of the colon with a dilated stool-filled rectum. Imaging features would be compatible with clinical constipation in the appropriate setting. Electronically Signed   By: Kennith Center M.D.   On: 08/27/2015 13:42    I have personally reviewed images of chest xray   EKG:  Personally reviewed. Sinus rhythm  ASSESSMENT AND PLAN: Present on Admission: . Syncope: Given prodrome of nausea, lightheadedness-and prior history of numerous syncopes-highly suspicious for a neurally mediated syncope. Patient likely has autonomic dysfunction-and has episodes of neurally mediated hypotension causing syncope. Monitor in telemetry overnight, has had prior extensive workup and numerous cardiology evaluations in the past. Place TED hose.  Marland Kitchen AKI (acute kidney injury): Likely related to transient hypotension, gently hydrate overnight and follow electrolytes.   . Hypothyroidism: Continue levothyroxine  . Coronary artery disease involving native coronary artery of native heart without angina pectoris: No chest pain or shortness of breath. Continue antiplatelet agents  . Dyslipidemia: Continue statin  . Orthostatic hypotension: Suspect likely secondary to autonomic dysfunction. This is a chronic issue. Patient is no longer on any antihypertensive medications. Mododrine and Florinef is being continued. Patient is very minimally ambulatory, and is mostly bed to chair bound.  . Chronic diastolic congestive heart failure: Clinically compensated-no evidence of volume  overload-cautiously hydrated, and cautiously continue with Florinef.   . Depression/dementia: Stable, continue Seroquel  . Constipation: Seen on x-ray, start Senokot and MiraLAX  Further plan will depend as patient's clinical course evolves and further radiologic and laboratory data become available. Patient will be monitored closely.  Above noted plan was discussed with patient/daughter's face to face at bedside, they were in agreement.   CONSULTS: None  DVT Prophylaxis: Prophylactic  Heparin  Code Status: Full Code  Disposition Plan:  Discharge back home 8/26  Admission status: Observation going to tele  Total time spent  55 minutes.Greater than 50% of this time was spent in counseling, explanation of diagnosis, planning of further management, and coordination of care.  [facility] Pager [phone]  If 7PM-7AM, please contact night-coverage [email or web address] Password [facility] 08/27/2015, 4:46 PM

## 2015-08-28 DIAGNOSIS — E785 Hyperlipidemia, unspecified: Secondary | ICD-10-CM | POA: Diagnosis not present

## 2015-08-28 DIAGNOSIS — I951 Orthostatic hypotension: Secondary | ICD-10-CM

## 2015-08-28 DIAGNOSIS — R55 Syncope and collapse: Secondary | ICD-10-CM | POA: Diagnosis not present

## 2015-08-28 DIAGNOSIS — I5032 Chronic diastolic (congestive) heart failure: Secondary | ICD-10-CM | POA: Diagnosis not present

## 2015-08-28 DIAGNOSIS — N179 Acute kidney failure, unspecified: Secondary | ICD-10-CM | POA: Diagnosis not present

## 2015-08-28 LAB — BASIC METABOLIC PANEL
ANION GAP: 13 (ref 5–15)
BUN: 21 mg/dL — ABNORMAL HIGH (ref 6–20)
CALCIUM: 9.5 mg/dL (ref 8.9–10.3)
CO2: 14 mmol/L — AB (ref 22–32)
CREATININE: 1.31 mg/dL — AB (ref 0.61–1.24)
Chloride: 111 mmol/L (ref 101–111)
GFR, EST AFRICAN AMERICAN: 54 mL/min — AB (ref >60)
GFR, EST NON AFRICAN AMERICAN: 47 mL/min — AB (ref >60)
Glucose, Bld: 76 mg/dL (ref 65–99)
Potassium: 4.5 mmol/L (ref 3.5–5.1)
Sodium: 138 mmol/L (ref 135–145)

## 2015-08-28 LAB — GLUCOSE, CAPILLARY
Glucose-Capillary: 68 mg/dL (ref 65–99)
Glucose-Capillary: 63 mg/dL — ABNORMAL LOW (ref 65–99)
Glucose-Capillary: 66 mg/dL (ref 65–99)

## 2015-08-28 MED ORDER — HYDRALAZINE HCL 20 MG/ML IJ SOLN
10.0000 mg | Freq: Once | INTRAMUSCULAR | Status: AC
  Administered 2015-08-28: 10 mg via INTRAVENOUS
  Filled 2015-08-28: qty 1

## 2015-08-28 NOTE — Progress Notes (Signed)
Pt has orders for discharge. IV and telemetry removed; IV site WNL. Discharge instructions given, pt verbalized understanding. Medication regimen reviewed, pt verbalized understanding. No further questions or concerns at this time. Pt awaiting transportation. 

## 2015-08-28 NOTE — Discharge Summary (Signed)
PATIENT DETAILS Name: Jeremy [name] Age: 82 y.o. Sex: male Date of Birth: Oct 04, 1925 MRN: [id number]. Admitting Physician: Maretta Bees, MD WUJ:[id number], Lolita Cram  Admit Date: 08/27/2015 Discharge date: 08/28/2015  Recommendations for Outpatient Follow-up:  1. Follow up with PCP in 1-2 weeks 2. Please obtain BMP/CBC in one week 3. Consider initiation of palliative care slowly as an outpatient.   Admitted From:  Home with home health services  Disposition: Home with home health services   Home Health:  Yes-home health PT/RN ordered  Equipment/Devices: Has a chronic indwelling Foley catheter in place  Discharge Condition: Stable  CODE STATUS: FULL CODE  Diet recommendation:  Regular  Brief Summary: See H&P, Labs, Consult and Test reports for all details in brief, patient is a 82 y.o. male with medical history significant of dementia, hypertension, CAD, chronic indwelling Foley catheter, numerous syncopal episodes in the past part to be due to autonomic dysfunction, presented to the ED for evaluation of another syncopal episode that he sustained while he was at his doctor's/podiatrist's office. He was found to have slight AKI, and admitted overnight for gentle hydration. Telemetry continues to be negative, he is being discharged home in a stable manner  Brief Hospital Course: Syncope: Given prodrome of nausea, lightheadedness-and prior history of numerous syncopes-highly suspicious for a neurally mediated syncope. Patient likely has autonomic dysfunction-and has episodes of neurally mediated hypotension causing syncope. Telemetry-negative overnight. Patient has had extensive workup, numerous cardiology and EP evaluations in the past. Doubt any further workup is required at this time.  AKI (acute kidney injury): Likely related to transient hypotension, is hydrated overnight, creatinine now downtrending and almost closed back to his usual baseline. Please recheck electrolytes  at next visit with PCP.   Dyslipidemia: Continue statin  Orthostatic hypotension: Suspect likely secondary to autonomic dysfunction. This is a chronic issue. Patient is no longer on any antihypertensive medications. Mododrine and Florinef is being continued. Patient is very minimally ambulatory, and is mostly bed to chair bound.  Chronic diastolic congestive heart failure: Clinically compensated-no evidence of volume overload-cautiously hydrated, and cautiously continue with Florinef.   Depression/dementia: Stable, continue Seroquel  Deconditioning/failure to thrive syndrome: Patient is very frail, has had numerous hospitalizations over the past few years. He likely has dementia, is mostly bed/chair bound-and now has developed autonomic dysfunction causing recurrent syncope. He is already on Florinef and Midodrine. His overall prognosis is poor, suggest PCP slowly initiate palliative measures in the outpatient setting.  Procedures/Studies: None  Discharge Diagnoses:  Active Problems:   Hypothyroidism   Dyslipidemia   Coronary artery disease involving native coronary artery of native heart without angina pectoris   Chronic diastolic congestive heart failure (HCC)   Depression   AKI (acute kidney injury) (HCC)   Orthostatic hypotension   Hypotension   Syncope   Discharge Instructions:  Activity:  As tolerated with Full fall precautions use walker/cane & assistance as needed   Discharge Instructions    Call MD for:  extreme fatigue    Complete by:  As directed   Call MD for:  persistant dizziness or light-headedness    Complete by:  As directed   Diet general    Complete by:  As directed   Increase activity slowly    Complete by:  As directed       Medication List    TAKE these medications   aspirin EC 81 MG tablet Take 81 mg by mouth every morning.   clopidogrel 75 MG tablet Commonly known  as:  PLAVIX Take 75 mg by mouth every morning.   CRANBERRY PO Take 1 tablet  by mouth every evening.   dorzolamide 2 % ophthalmic solution Commonly known as:  TRUSOPT Place 1 drop into the right eye 2 (two) times daily.   finasteride 5 MG tablet Commonly known as:  PROSCAR Take 5 mg by mouth every evening.   fludrocortisone 0.1 MG tablet Commonly known as:  FLORINEF Take 0.5 tablets (0.05 mg total) by mouth daily. What changed:  when to take this   latanoprost 0.005 % ophthalmic solution Commonly known as:  XALATAN Place 1 drop into both eyes at bedtime.   levothyroxine 75 MCG tablet Commonly known as:  SYNTHROID, LEVOTHROID Take 75 mcg by mouth daily before breakfast.   meloxicam 15 MG tablet Commonly known as:  MOBIC Take 15 mg by mouth every morning.   midodrine 5 MG tablet Commonly known as:  PROAMATINE Take 1 tablet (5 mg total) by mouth 3 (three) times daily with meals.   OVER THE COUNTER MEDICATION Take 1 tablet by mouth every Monday, Wednesday, and Friday. With lunch.  Drop 1 tablet in water daily and drink- Over the counter [employer]   QUEtiapine 25 MG tablet Commonly known as:  SEROQUEL Take 1 tablet (25 mg total) by mouth at bedtime. For behaviors What changed:  additional instructions   ranitidine 75 MG tablet Commonly known as:  ZANTAC Take 75 mg by mouth every morning. Reported on 06/08/2015   simvastatin 40 MG tablet Commonly known as:  ZOCOR Take 40 mg by mouth daily at 6 PM.   timolol 0.5 % ophthalmic solution Commonly known as:  TIMOPTIC Place 1 drop into both eyes 2 (two) times daily.   Vitamin D (Ergocalciferol) 50000 units Caps capsule Commonly known as:  DRISDOL Take 50,000 Units by mouth 2 (two) times a week. Monday and Thursday in the evening       Allergies  Allergen Reactions  . Tramadol Nausea And Vomiting      Consultations:   None   Other Procedures/Studies: Dg Chest 2 View  Result Date: 08/07/2015 CLINICAL DATA:  Confusion, vomiting and fever. EXAM: CHEST  2 VIEW COMPARISON:  Chest x-ray  dated 05/03/2015. FINDINGS: Heart size is normal. Overall cardiomediastinal silhouette is stable in size and configuration. Lungs are hyperexpanded. Lungs are clear. No pleural effusion or pneumothorax seen. No acute or suspicious osseous finding. IMPRESSION: No active cardiopulmonary disease.  Probable COPD. Electronically Signed   By: Bary Richard M.D.   On: 08/07/2015 12:25   Dg Abd Acute W/chest  Result Date: 08/27/2015 CLINICAL DATA:  Syncopal episode. EXAM: DG ABDOMEN ACUTE W/ 1V CHEST COMPARISON:  Chest x-ray 08/07/2015. FINDINGS: Lungs appear hyperexpanded. The lungs are clear wiithout focal pneumonia, edema, pneumothorax or pleural effusion. Calcified granuloma right apex again noted. The cardiopericardial silhouette is within normal limits for size. The visualized bony structures of the thorax are intact. Upright film shows no evidence for intraperitoneal free air. Supine abdomen shows prominent stool volume throughout the length of the colon with stool filled rectum measuring 9.3 cm in coronal diameter. No gaseous small bowel dilatation suggest small bowel obstruction. Visualized bony anatomy is unremarkable. Heterotopic ossification in the region of the right lesser trochanter suggests remote trauma. IMPRESSION: 1. Hyperexpansion without acute cardiopulmonary findings. 2. Prominent stool throughout the length of the colon with a dilated stool-filled rectum. Imaging features would be compatible with clinical constipation in the appropriate setting. Electronically Signed   By: Minerva Areola  Molli Posey M.D.   On: 08/27/2015 13:42      TODAY-DAY OF DISCHARGE:  Subjective:   Jeremy [name] today has no headache,no chest abdominal pain,no new weakness tingling or numbness, feels much better wants to go home today.   Objective:   Blood pressure 112/70, pulse 82, temperature 98 F (36.7 C), temperature source Oral, resp. rate 18, height 5\' 11"  (1.803 m), weight 61.7 kg (136 lb 1.6 oz), SpO2 100  %.  Intake/Output Summary (Last 24 hours) at 08/28/15 1152 Last data filed at 08/28/15 0724  Gross per 24 hour  Intake              503 ml  Output             1900 ml  Net            -1397 ml   Filed Weights   08/27/15 1710 08/28/15 0500  Weight: 60.3 kg (133 lb) 61.7 kg (136 lb 1.6 oz)    Exam: Awake Alert, Oriented *3, No new F.N deficits, Normal affect [place].AT,PERRAL Supple Neck,No JVD, No cervical lymphadenopathy appriciated.  Symmetrical Chest wall movement, Good air movement bilaterally, CTAB RRR,No Gallops,Rubs or new Murmurs, No Parasternal Heave +ve B.Sounds, Abd Soft, Non tender, No organomegaly appriciated, No rebound -guarding or rigidity. No Cyanosis, Clubbing or edema, No new Rash or bruise   PERTINENT RADIOLOGIC STUDIES: Dg Chest 2 View  Result Date: 08/07/2015 CLINICAL DATA:  Confusion, vomiting and fever. EXAM: CHEST  2 VIEW COMPARISON:  Chest x-ray dated 05/03/2015. FINDINGS: Heart size is normal. Overall cardiomediastinal silhouette is stable in size and configuration. Lungs are hyperexpanded. Lungs are clear. No pleural effusion or pneumothorax seen. No acute or suspicious osseous finding. IMPRESSION: No active cardiopulmonary disease.  Probable COPD. Electronically Signed   By: Bary Richard M.D.   On: 08/07/2015 12:25   Dg Abd Acute W/chest  Result Date: 08/27/2015 CLINICAL DATA:  Syncopal episode. EXAM: DG ABDOMEN ACUTE W/ 1V CHEST COMPARISON:  Chest x-ray 08/07/2015. FINDINGS: Lungs appear hyperexpanded. The lungs are clear wiithout focal pneumonia, edema, pneumothorax or pleural effusion. Calcified granuloma right apex again noted. The cardiopericardial silhouette is within normal limits for size. The visualized bony structures of the thorax are intact. Upright film shows no evidence for intraperitoneal free air. Supine abdomen shows prominent stool volume throughout the length of the colon with stool filled rectum measuring 9.3 cm in coronal diameter. No gaseous  small bowel dilatation suggest small bowel obstruction. Visualized bony anatomy is unremarkable. Heterotopic ossification in the region of the right lesser trochanter suggests remote trauma. IMPRESSION: 1. Hyperexpansion without acute cardiopulmonary findings. 2. Prominent stool throughout the length of the colon with a dilated stool-filled rectum. Imaging features would be compatible with clinical constipation in the appropriate setting. Electronically Signed   By: Kennith Center M.D.   On: 08/27/2015 13:42     PERTINENT LAB RESULTS: CBC:  Recent Labs  08/26/15 0545 08/27/15 1422  WBC 6.9 7.6  HGB 13.0 13.8  HCT 39.4 41.8  PLT 185 179   CMET CMP     Component Value Date/Time   NA 138 08/28/2015 0759   NA 141 11/13/2014   K 4.5 08/28/2015 0759   CL 111 08/28/2015 0759   CO2 14 (L) 08/28/2015 0759   GLUCOSE 76 08/28/2015 0759   BUN 21 (H) 08/28/2015 0759   BUN 19 11/13/2014   CREATININE 1.31 (H) 08/28/2015 0759   CALCIUM 9.5 08/28/2015 0759   PROT 9.1 (H) 08/27/2015  1422   ALBUMIN 4.0 08/27/2015 1422   AST 24 08/27/2015 1422   ALT 12 (L) 08/27/2015 1422   ALKPHOS 76 08/27/2015 1422   BILITOT 0.7 08/27/2015 1422   GFRNONAA 47 (L) 08/28/2015 0759   GFRAA 54 (L) 08/28/2015 0759    GFR Estimated Creatinine Clearance: 34 mL/min (by C-G formula based on SCr of 1.31 mg/dL).  Recent Labs  08/25/15 1307  LIPASE 36    Recent Labs  08/25/15 1307  TROPONINI <0.03   Invalid input(s): POCBNP No results for input(s): DDIMER in the last 72 hours. No results for input(s): HGBA1C in the last 72 hours. No results for input(s): CHOL, HDL, LDLCALC, TRIG, CHOLHDL, LDLDIRECT in the last 72 hours. No results for input(s): TSH, T4TOTAL, T3FREE, THYROIDAB in the last 72 hours.  Invalid input(s): FREET3 No results for input(s): VITAMINB12, FOLATE, FERRITIN, TIBC, IRON, RETICCTPCT in the last 72 hours. Coags: No results for input(s): INR in the last 72 hours.  Invalid input(s):  PT Microbiology: Recent Results (from the past 240 hour(s))  MRSA PCR Screening     Status: None   Collection Time: 08/25/15  9:42 PM  Result Value Ref Range Status   MRSA by PCR NEGATIVE NEGATIVE Final    Comment:        The GeneXpert MRSA Assay (FDA approved for NASAL specimens only), is one component of a comprehensive MRSA colonization surveillance program. It is not intended to diagnose MRSA infection nor to guide or monitor treatment for MRSA infections.   Urine culture     Status: Abnormal (Preliminary result)   Collection Time: 08/27/15  1:12 PM  Result Value Ref Range Status   Specimen Description URINE, RANDOM  Final   Special Requests NONE  Final   Culture >=100,000 COLONIES/mL PSEUDOMONAS AERUGINOSA (A)  Final   Report Status PENDING  Incomplete    FURTHER DISCHARGE INSTRUCTIONS:  Get Medicines reviewed and adjusted: Please take all your medications with you for your next visit with your Primary MD  Laboratory/radiological data: Please request your Primary MD to go over all hospital tests and procedure/radiological results at the follow up, please ask your Primary MD to get all Hospital records sent to his/her office.  In some cases, they will be blood work, cultures and biopsy results pending at the time of your discharge. Please request that your primary care M.D. goes through all the records of your hospital data and follows up on these results.  Also Note the following: If you experience worsening of your admission symptoms, develop shortness of breath, life threatening emergency, suicidal or homicidal thoughts you must seek medical attention immediately by calling 911 or calling your MD immediately  if symptoms less severe.  You must read complete instructions/literature along with all the possible adverse reactions/side effects for all the Medicines you take and that have been prescribed to you. Take any new Medicines after you have completely understood and  accpet all the possible adverse reactions/side effects.   Do not drive when taking Pain medications or sleeping medications (Benzodaizepines)  Do not take more than prescribed Pain, Sleep and Anxiety Medications. It is not advisable to combine anxiety,sleep and pain medications without talking with your primary care practitioner  Special Instructions: If you have smoked or chewed Tobacco  in the last 2 yrs please stop smoking, stop any regular Alcohol  and or any Recreational drug use.  Wear Seat belts while driving.  Please note: You were cared for by a hospitalist during your  hospital stay. Once you are discharged, your primary care physician will handle any further medical issues. Please note that NO REFILLS for any discharge medications will be authorized once you are discharged, as it is imperative that you return to your primary care physician (or establish a relationship with a primary care physician if you do not have one) for your post hospital discharge needs so that they can reassess your need for medications and monitor your lab values.  Total Time spent coordinating discharge including counseling, education and face to face time equals 25  minutes.  SignedJeoffrey Massed: GHIMIRE,SHANKER 08/28/2015 11:52 AM

## 2015-08-28 NOTE — Progress Notes (Signed)
Pt's CBG at 0630 was 68; Hypoglycemia protocol initiated; given 8 oz of orange juice; rechecked CBG after 15 minutes and went down to 66; given another cup of orange juice and will recheck again after 15 minutes.Will continue to monitor.

## 2015-08-28 NOTE — Progress Notes (Signed)
Pt's BP at 0632 was 191/106 lying down with a dinamap on the left arm; HR 85; NP on call paged and notified of event; Hydralazine 10 mg ordered and administered to pt. Will continue to monitor.

## 2015-08-29 ENCOUNTER — Telehealth: Payer: Self-pay | Admitting: Physician Assistant

## 2015-08-29 ENCOUNTER — Other Ambulatory Visit: Payer: Self-pay | Admitting: Physician Assistant

## 2015-08-29 DIAGNOSIS — R55 Syncope and collapse: Secondary | ICD-10-CM

## 2015-08-29 LAB — URINE CULTURE: Culture: >=100000 — AB

## 2015-08-29 MED ORDER — FLUDROCORTISONE ACETATE 0.1 MG PO TABS: 0.0500 mg | ORAL_TABLET | Freq: Every morning | ORAL | 1 refills | Status: DC

## 2015-08-29 NOTE — Telephone Encounter (Signed)
[name] was just evaluated in the emergency room for syncope and discharged home. He had another syncopal episode today. His family did as instructed. He is lying on the floor, his feet are elevated and his blood pressures coming up very slowly.   The daughter wants to know if he can take an extra half tablet of the midodrine for this. She was originally told this was okay, but the ER physician was worried about his blood pressure being too high and said he should not do it.  She is out of the Florinef, which was also helping. She is trying to get in touch with his family physician to refill this, but they have not yet responded.  I advised her it was okay to take an extra one half tablet of Midodrine when he has an episode. I sent him a 30 day supply of the Florinef but advised her this really should be filled through his primary care physician. She will call them to get an ongoing prescription. I advised her that his blood pressure may need to run higher than normal in order to keep it from dropping too low, she should work with his family physician on parameters for his medications. She is in agreement with this plan of care.  Jeremy [name], Jeremy [name], Jeremy [name] 08/29/2015 11:28 AM Beeper [phone]

## 2015-09-13 ENCOUNTER — Ambulatory Visit (INDEPENDENT_AMBULATORY_CARE_PROVIDER_SITE_OTHER): Payer: Medicare Other | Admitting: Podiatry

## 2015-09-13 DIAGNOSIS — M79671 Pain in right foot: Secondary | ICD-10-CM

## 2015-09-13 DIAGNOSIS — M79672 Pain in left foot: Secondary | ICD-10-CM | POA: Diagnosis not present

## 2015-09-13 DIAGNOSIS — B351 Tinea unguium: Secondary | ICD-10-CM

## 2015-09-13 DIAGNOSIS — R600 Localized edema: Secondary | ICD-10-CM

## 2015-09-13 DIAGNOSIS — M79605 Pain in left leg: Secondary | ICD-10-CM

## 2015-09-13 DIAGNOSIS — M79604 Pain in right leg: Secondary | ICD-10-CM

## 2015-09-13 NOTE — Progress Notes (Signed)
SUBJECTIVE Patient presents to office today complaining of elongated, thickened nails. Pain while ambulating in shoes. Patient is unable to trim their own nails. Patient did have an appointment here a few weeks ago however he had a syncopal episode here in the office waiting lobby at which time the ambulance was called and [facility]. Patient presents today with his daughter  Allergies  Allergen Reactions  . Tramadol Nausea And Vomiting    OBJECTIVE General Patient is awake, alert, and oriented x 3 and in no acute distress. Derm Skin is dry and supple bilateral. Negative open lesions or macerations. Remaining integument unremarkable. Nails are tender, long, thickened and dystrophic with subungual debris, consistent with onychomycosis, 1-5 bilateral. No signs of infection noted. Vasc  DP and PT pedal pulses palpable bilaterally. Temperature gradient within normal limits.  Neuro Epicritic and protective threshold sensation diminished bilaterally.  Musculoskeletal Exam No symptomatic pedal deformities noted bilateral. Muscular strength within normal limits.  ASSESSMENT 1. Dermatophytosis of nail 2. Onychomycosis of nail due to dermatophyte bilateral 3. Pain in foot bilateral  4. Venous insufficiency 5. Vascular edema lateral lower extremities  PLAN OF CARE Patient evaluated today. Instructed to maintain good pedal hygiene and foot care. Stressed importance of wearing compression stockings to prevent edema and skin breakdown. Mechanical debridement of nails 1-5 bilaterally performed using a nail nipper. Filed with dremel without incident.  All patient questions were answered. Return to clinic in 3 mos.    Felecia ShellingBrent M Evans, DPM

## 2015-09-13 NOTE — Patient Instructions (Signed)

## 2015-09-24 ENCOUNTER — Telehealth: Payer: Self-pay | Admitting: *Deleted

## 2015-09-24 NOTE — Telephone Encounter (Signed)
Received fax from Arnette FeltsJanece [name], FNP at [facility] concerning patient's BP being 140/114. Dr Allyson SabalBerry reviewed and recommended to discontinue the Florinef and monitor BP.  [facility] verbalized understanding and will call pt. Document placed to be scanned into system.

## 2015-09-25 ENCOUNTER — Encounter (HOSPITAL_COMMUNITY): Payer: Self-pay | Admitting: Nurse Practitioner

## 2015-09-25 ENCOUNTER — Emergency Department (HOSPITAL_COMMUNITY)
Admission: EM | Admit: 2015-09-25 | Discharge: 2015-09-26 | Disposition: A | Discharge status: Home / Self Care | Payer: Medicare Other | Attending: Emergency Medicine | Admitting: Emergency Medicine

## 2015-09-25 DIAGNOSIS — E039 Hypothyroidism, unspecified: Secondary | ICD-10-CM | POA: Diagnosis not present

## 2015-09-25 DIAGNOSIS — T83091A Other mechanical complication of indwelling urethral catheter, initial encounter: Secondary | ICD-10-CM | POA: Diagnosis not present

## 2015-09-25 DIAGNOSIS — Z7982 Long term (current) use of aspirin: Secondary | ICD-10-CM | POA: Insufficient documentation

## 2015-09-25 DIAGNOSIS — Z87891 Personal history of nicotine dependence: Secondary | ICD-10-CM | POA: Insufficient documentation

## 2015-09-25 DIAGNOSIS — N390 Urinary tract infection, site not specified: Secondary | ICD-10-CM | POA: Diagnosis not present

## 2015-09-25 DIAGNOSIS — R030 Elevated blood-pressure reading, without diagnosis of hypertension: Secondary | ICD-10-CM

## 2015-09-25 DIAGNOSIS — Z79899 Other long term (current) drug therapy: Secondary | ICD-10-CM | POA: Diagnosis not present

## 2015-09-25 DIAGNOSIS — I130 Hypertensive heart and chronic kidney disease with heart failure and stage 1 through stage 4 chronic kidney disease, or unspecified chronic kidney disease: Secondary | ICD-10-CM | POA: Diagnosis not present

## 2015-09-25 DIAGNOSIS — I2510 Atherosclerotic heart disease of native coronary artery without angina pectoris: Secondary | ICD-10-CM | POA: Diagnosis not present

## 2015-09-25 DIAGNOSIS — Y658 Other specified misadventures during surgical and medical care: Secondary | ICD-10-CM | POA: Diagnosis not present

## 2015-09-25 DIAGNOSIS — T839XXA Unspecified complication of genitourinary prosthetic device, implant and graft, initial encounter: Secondary | ICD-10-CM

## 2015-09-25 DIAGNOSIS — I5032 Chronic diastolic (congestive) heart failure: Secondary | ICD-10-CM | POA: Diagnosis not present

## 2015-09-25 DIAGNOSIS — N183 Chronic kidney disease, stage 3 (moderate): Secondary | ICD-10-CM | POA: Insufficient documentation

## 2015-09-25 DIAGNOSIS — IMO0001 Reserved for inherently not codable concepts without codable children: Secondary | ICD-10-CM

## 2015-09-25 LAB — URINALYSIS, ROUTINE W REFLEX MICROSCOPIC
Bilirubin Urine: NEGATIVE
Glucose, UA: NEGATIVE mg/dL
Ketones, ur: NEGATIVE mg/dL
Nitrite: POSITIVE — AB
Protein, ur: 30 mg/dL — AB
Specific Gravity, Urine: 1.007 (ref 1.005–1.030)
pH: 7 (ref 5.0–8.0)

## 2015-09-25 LAB — URINE MICROSCOPIC-ADD ON: Squamous Epithelial / HPF: NONE SEEN

## 2015-09-25 MED ORDER — CIPROFLOXACIN HCL 500 MG PO TABS
500.0000 mg | ORAL_TABLET | Freq: Once | ORAL | Status: AC
  Administered 2015-09-25: 500 mg via ORAL
  Filled 2015-09-25: qty 1

## 2015-09-25 MED ORDER — CIPROFLOXACIN HCL 500 MG PO TABS: 500.0000 mg | ORAL_TABLET | Freq: Two times a day (BID) | ORAL | 0 refills | Status: DC

## 2015-09-25 NOTE — ED Provider Notes (Signed)
[facility]-EMERGENCY DEPT Provider Note   CSN: [id number] Arrival date & time: 09/25/15  2123 By signing my name below, I, Levon HedgerElizabeth Hall, attest that this documentation has been prepared under the direction and in the presence of non-physician practitioner, Earley FavorGail Cordarius Benning, NP Electronically Signed: Levon HedgerElizabeth Hall, Scribe. 09/25/2015. 9:58 PM.   History   Chief Complaint Chief Complaint  Patient presents with  . Hypertension  . Foley Cath Problem    HPI Jeremy [name] is a [age] y.o. male with hx of HTN, CAD, and dementia who presents to the Emergency Department complaining of a foley catheter leak which was noticed tonight at 7:30. Daughter is unsure of when the leak began and when the catheter was last changed. Pt and daughter also note associated hypertension and penile pain. He rates his penile pain as 9/10. Per daughter, he has not taken his is nighttime medication tonight, but his BP has been high for several hours. This was noticed during his routine blood pressure checks performed by nurse aides every two hours. Pt denies headache, n/v/d, SOB, chest pain, or change in appetite.   The history is provided by the patient and a relative. No language interpreter was used.   Past Medical History:  Diagnosis Date  . CAD (coronary artery disease)   . Dementia   . HTN (hypertension)   . Hyperlipidemia   . Hypotension   . Hypothyroidism    s/p Thyroidectomy, partial  . Sepsis (HCC)   . Syncope    4/11 (hospitalized) and 4/13  . Syncope   . UTI (lower urinary tract infection)     Patient Active Problem List   Diagnosis Date Noted  . Syncope 08/27/2015  . Hyperkalemia 08/26/2015  . Hypotension 08/25/2015  . Vasovagal near syncope 08/25/2015  . Nausea & vomiting 08/25/2015  . SIRS (systemic inflammatory response syndrome) (HCC) 06/08/2015  . Orthostatic hypotension 06/01/2015  . Confusion   . Urinary tract infectious disease   . UTI (urinary tract infection) due to urinary indwelling  catheter (HCC) 04/19/2015  . Pressure ulcer 04/19/2015  . Syncope and collapse 04/15/2015  . Syncopal episodes 04/15/2015  . AKI (acute kidney injury) (HCC) 04/14/2015  . Abnormality of gait 02/25/2015  . Palliative care encounter 12/10/2014  . Protein-calorie malnutrition, severe 12/06/2014  . Aspiration pneumonia of right lower lobe due to regurgitated food (HCC) 12/06/2014  . CKD (chronic kidney disease) stage 3, GFR 30-59 ml/min 12/06/2014  . Anemia of chronic renal failure, stage 3 (moderate) 12/06/2014  . Dyslipidemia 12/06/2014  . Coronary artery disease involving native coronary artery of native heart without angina pectoris 12/06/2014  . BPH (benign prostatic hyperplasia) 12/06/2014  . Chronic diastolic congestive heart failure (HCC) 12/06/2014  . Depression 12/06/2014  . HCAP (healthcare-associated pneumonia) 12/05/2014  . Dementia with behavioral disturbance 11/23/2014  . Acute encephalopathy 11/06/2014  . UTI (lower urinary tract infection) 11/06/2014  . Hypothyroidism 01/26/2014    Past Surgical History:  Procedure Laterality Date  . BACK SURGERY    . CARDIAC CATHETERIZATION  02/25/2002   Proximal L Circumflex 95% lesion, stented w/ 3x18-mm CYPHER stent avoiding the 2 L Marginal branch, jailing the 1st marginal, resulting in reduction of a 90-95% lesion to 0% residual  . CARDIOVASCULAR STRESS TEST  12/18/2006   EKG negative for ischemia, no significant ischemia demonstrated.  . CORONARY ANGIOPLASTY WITH STENT PLACEMENT    . RENAL DOPPLER  08/17/2005   Normal evaluation  . THYROIDECTOMY, PARTIAL    . TRANSTHORACIC ECHOCARDIOGRAM  03/13/2002  EF >55%, moderate LVH,       Home Medications    Prior to Admission medications   Medication Sig Start Date End Date Taking? Authorizing Provider  aspirin EC 81 MG tablet Take 81 mg by mouth every morning.    Yes Historical Provider, MD  clopidogrel (PLAVIX) 75 MG tablet Take 75 mg by mouth every morning.    Yes Historical  Provider, MD  CRANBERRY PO Take 1 tablet by mouth every evening.    Yes Historical Provider, MD  dorzolamide (TRUSOPT) 2 % ophthalmic solution Place 1 drop into the right eye 2 (two) times daily.  08/23/12  Yes Historical Provider, MD  finasteride (PROSCAR) 5 MG tablet Take 5 mg by mouth every evening.    Yes Historical Provider, MD  fludrocortisone (FLORINEF) 0.1 MG tablet Take 0.5 tablets (0.05 mg total) by mouth every morning. 08/29/15  Yes Rhonda G Barrett, PA-C  latanoprost (XALATAN) 0.005 % ophthalmic solution Place 1 drop into both eyes at bedtime.   Yes Historical Provider, MD  levothyroxine (SYNTHROID, LEVOTHROID) 75 MCG tablet Take 75 mcg by mouth daily before breakfast.  11/06/14  Yes Historical Provider, MD  meloxicam (MOBIC) 15 MG tablet Take 15 mg by mouth every morning.  02/17/15  Yes Historical Provider, MD  midodrine (PROAMATINE) 5 MG tablet Take 1 tablet (5 mg total) by mouth 3 (three) times daily with meals. 05/04/15  Yes Dwana Melena, PA-C  OVER THE COUNTER MEDICATION Take 1 tablet by mouth every Monday, Wednesday, and Friday. With lunch.  Drop 1 tablet in water daily and drink- Over the counter [employer]   Yes Historical Provider, MD  QUEtiapine (SEROQUEL) 25 MG tablet Take 1 tablet (25 mg total) by mouth at bedtime. For behaviors Patient taking differently: Take 25 mg by mouth at bedtime. For behavior 02/25/15  Yes Levert Feinstein, MD  ranitidine (ZANTAC) 75 MG tablet Take 75 mg by mouth every morning. Reported on 06/08/2015   Yes Historical Provider, MD  simvastatin (ZOCOR) 40 MG tablet Take 40 mg by mouth daily at 6 PM.    Yes Historical Provider, MD  timolol (TIMOPTIC) 0.5 % ophthalmic solution Place 1 drop into both eyes 2 (two) times daily.  09/18/12  Yes Historical Provider, MD  Vitamin D, Ergocalciferol, (DRISDOL) 50000 units CAPS capsule Take 50,000 Units by mouth 2 (two) times a week. Monday and Thursday in the evening   Yes Historical Provider, MD  ciprofloxacin (CIPRO) 500 MG  tablet Take 1 tablet (500 mg total) by mouth 2 (two) times daily. 09/25/15   Earley Favor, NP    Family History Family History  Problem Relation Age of Onset  . Family history unknown: Yes    Social History Social History  Substance Use Topics  . Smoking status: Former Games developer  . Smokeless tobacco: Never Used     Comment: Quit 50+ years ago.  . Alcohol use No     Allergies   Tramadol  Review of Systems Review of Systems  Constitutional: Negative for appetite change.  Respiratory: Negative for shortness of breath.   Cardiovascular: Negative for chest pain.       + high blood pressure  Gastrointestinal: Negative for diarrhea, nausea and vomiting.  Genitourinary: Positive for penile pain.  All other systems reviewed and are negative.   Physical Exam Updated Vital Signs BP (!) 172/116 (BP Location: Left Arm)   Pulse 105   Temp 99.3 F (37.4 C) (Oral)   Resp 20   SpO2 100%  Physical Exam  Constitutional: He is oriented to person, place, and time. He appears well-developed and well-nourished. No distress.  HENT:  Head: Normocephalic and atraumatic.  Eyes: Conjunctivae are normal.  Cardiovascular: Normal rate.   Pulmonary/Chest: Effort normal.  Abdominal: He exhibits no distension.  Neurological: He is alert and oriented to person, place, and time.  Skin: Skin is warm and dry.  Psychiatric: He has a normal mood and affect.  Nursing note and vitals reviewed.   ED Treatments / Results  DIAGNOSTIC STUDIES:  Oxygen Saturation is 100% on RA, normal by my interpretation.    COORDINATION OF CARE:  9:57 PM Will order urinalysis. Discussed treatment plan with pt at bedside and pt agreed to plan.   Labs (all labs ordered are listed, but only abnormal results are displayed) Labs Reviewed  URINALYSIS, ROUTINE W REFLEX MICROSCOPIC (NOT AT [facility]) - Abnormal; Notable for the following:       Result Value   APPearance TURBID (*)    Hgb urine dipstick LARGE (*)     Protein, ur 30 (*)    Nitrite POSITIVE (*)    Leukocytes, UA LARGE (*)    All other components within normal limits  URINE MICROSCOPIC-ADD ON - Abnormal; Notable for the following:    Bacteria, UA FEW (*)    All other components within normal limits  URINE CULTURE    EKG  EKG Interpretation None       Radiology No results found.  Procedures Procedures (including critical care time)  Medications Ordered in ED Medications  ciprofloxacin (CIPRO) tablet 500 mg (not administered)     Initial Impression / Assessment and Plan / ED Course  I have reviewed the triage vital signs and the nursing notes.  Pertinent labs & imaging results that were available during my care of the patient were reviewed by me and considered in my medical decision making (see chart for details).  Clinical Course   Treating for UTI will not treat HTN at this time but have family continue monitoring They will FU with PCP in 2 days  Foley catheter changed, urine cultured    Final Clinical Impressions(s) / ED Diagnoses   Final diagnoses:  Elevated blood pressure  UTI (lower urinary tract infection)  Foley catheter problem, initial encounter (HCC)  I personally performed the services described in this documentation, which was scribed in my presence. The recorded information has been reviewed and is accurate.  New Prescriptions New Prescriptions   CIPROFLOXACIN (CIPRO) 500 MG TABLET    Take 1 tablet (500 mg total) by mouth 2 (two) times daily.     Earley Favor, NP 09/25/15 2349    Earley Favor, NP 09/25/15 2350    Benjiman Core, MD 09/26/15 [phone]

## 2015-09-25 NOTE — Discharge Instructions (Signed)
The foley catheter was changed tonight due to leaking and urinary tract infection. Your Da d was started on antibiotics The urine was cultured and results will be available in 24 hours His blood pressure was not treated due to recent history of dizzy/ fainting spells  Please monitor his blood pressure as you have been and call your PCP for further recommendations on Monday His kidney function continues to improve

## 2015-09-25 NOTE — ED Triage Notes (Signed)
Pt is c/o of a foley cath leak and elevated BP. Adds that last time the same happened fixing the catheter problem also fixed the BP problem.

## 2015-09-25 NOTE — ED Notes (Signed)
Bed: WA20 Expected date:  Expected time:  Means of arrival:  Comments: EMS 82 yo leaking around foley- HTN

## 2015-09-27 LAB — URINE CULTURE

## 2015-10-13 ENCOUNTER — Ambulatory Visit (INDEPENDENT_AMBULATORY_CARE_PROVIDER_SITE_OTHER): Payer: Medicare Other | Admitting: Podiatry

## 2015-10-13 ENCOUNTER — Encounter: Payer: Self-pay | Admitting: Podiatry

## 2015-10-13 VITALS — BP 128/77 | HR 86 | Resp 18

## 2015-10-13 DIAGNOSIS — B351 Tinea unguium: Secondary | ICD-10-CM

## 2015-10-13 DIAGNOSIS — M79609 Pain in unspecified limb: Principal | ICD-10-CM

## 2015-10-13 DIAGNOSIS — L603 Nail dystrophy: Secondary | ICD-10-CM

## 2015-10-13 DIAGNOSIS — L608 Other nail disorders: Secondary | ICD-10-CM

## 2015-10-13 DIAGNOSIS — M79676 Pain in unspecified toe(s): Secondary | ICD-10-CM

## 2015-10-18 NOTE — Progress Notes (Signed)
SUBJECTIVE Patient  presents to office today complaining of elongated, thickened nails. Pain while ambulating in shoes. Patient is unable to trim their own nails.   OBJECTIVE General Patient is awake, alert, and oriented x 3 and in no acute distress. Derm Skin is dry and supple bilateral. Negative open lesions or macerations. Remaining integument unremarkable. Nails are tender, long, thickened and dystrophic with subungual debris, consistent with onychomycosis, 1-5 bilateral. No signs of infection noted. Vasc  DP and PT pedal pulses palpable bilaterally. Temperature gradient within normal limits.  Neuro Epicritic and protective threshold sensation diminished bilaterally.  Musculoskeletal Exam No symptomatic pedal deformities noted bilateral. Muscular strength within normal limits.  ASSESSMENT 1. Onychodystrophic nails 1-5 bilateral with hyperkeratosis of nails.  2. Onychomycosis of nail due to dermatophyte bilateral 3. Pain in foot bilateral  PLAN OF CARE 1. Patient evaluated today.  2. Instructed to maintain good pedal hygiene and foot care.  3. Mechanical debridement of nails 1-5 bilaterally performed using a nail nipper. Filed with dremel without incident.  4. Silicone cap dispensed to offload left great toenail which is painful. 5. Return to clinic in 3 mos.    Felecia ShellingBrent M Evans, DPM

## 2015-10-27 ENCOUNTER — Encounter (HOSPITAL_COMMUNITY): Payer: Self-pay | Admitting: Emergency Medicine

## 2015-10-27 ENCOUNTER — Emergency Department (HOSPITAL_COMMUNITY)
Admission: EM | Admit: 2015-10-27 | Discharge: 2015-10-27 | Disposition: A | Discharge status: Home / Self Care | Payer: Medicare Other | Attending: Emergency Medicine | Admitting: Emergency Medicine

## 2015-10-27 ENCOUNTER — Emergency Department (HOSPITAL_COMMUNITY): Payer: Medicare Other

## 2015-10-27 DIAGNOSIS — R55 Syncope and collapse: Secondary | ICD-10-CM | POA: Insufficient documentation

## 2015-10-27 DIAGNOSIS — E039 Hypothyroidism, unspecified: Secondary | ICD-10-CM | POA: Insufficient documentation

## 2015-10-27 DIAGNOSIS — Z7982 Long term (current) use of aspirin: Secondary | ICD-10-CM | POA: Insufficient documentation

## 2015-10-27 DIAGNOSIS — I2510 Atherosclerotic heart disease of native coronary artery without angina pectoris: Secondary | ICD-10-CM | POA: Diagnosis not present

## 2015-10-27 DIAGNOSIS — N183 Chronic kidney disease, stage 3 (moderate): Secondary | ICD-10-CM | POA: Insufficient documentation

## 2015-10-27 DIAGNOSIS — Z87891 Personal history of nicotine dependence: Secondary | ICD-10-CM | POA: Diagnosis not present

## 2015-10-27 DIAGNOSIS — I130 Hypertensive heart and chronic kidney disease with heart failure and stage 1 through stage 4 chronic kidney disease, or unspecified chronic kidney disease: Secondary | ICD-10-CM | POA: Diagnosis not present

## 2015-10-27 DIAGNOSIS — Z955 Presence of coronary angioplasty implant and graft: Secondary | ICD-10-CM | POA: Insufficient documentation

## 2015-10-27 DIAGNOSIS — I5032 Chronic diastolic (congestive) heart failure: Secondary | ICD-10-CM | POA: Diagnosis not present

## 2015-10-27 LAB — CBC WITH DIFFERENTIAL/PLATELET
Basophils Absolute: 0 10*3/uL (ref 0.0–0.1)
Basophils Relative: 0 %
EOS PCT: 2 %
Eosinophils Absolute: 0.3 10*3/uL (ref 0.0–0.7)
HEMATOCRIT: 37.2 % — AB (ref 39.0–52.0)
Hemoglobin: 12.2 g/dL — ABNORMAL LOW (ref 13.0–17.0)
LYMPHS PCT: 16 %
Lymphs Abs: 1.8 10*3/uL (ref 0.7–4.0)
MCH: 28.6 pg (ref 26.0–34.0)
MCHC: 32.8 g/dL (ref 30.0–36.0)
MCV: 87.1 fL (ref 78.0–100.0)
MONO ABS: 0.9 10*3/uL (ref 0.1–1.0)
MONOS PCT: 7 %
NEUTROS ABS: 8.5 10*3/uL — AB (ref 1.7–7.7)
Neutrophils Relative %: 75 %
Platelets: 171 10*3/uL (ref 150–400)
RBC: 4.27 MIL/uL (ref 4.22–5.81)
RDW: 15.5 % (ref 11.5–15.5)
WBC: 11.4 10*3/uL — ABNORMAL HIGH (ref 4.0–10.5)

## 2015-10-27 LAB — URINALYSIS, ROUTINE W REFLEX MICROSCOPIC
Bilirubin Urine: NEGATIVE
GLUCOSE, UA: NEGATIVE mg/dL
KETONES UR: NEGATIVE mg/dL
Nitrite: NEGATIVE
PROTEIN: 100 mg/dL — AB
Specific Gravity, Urine: 1.019 (ref 1.005–1.030)
pH: 6 (ref 5.0–8.0)

## 2015-10-27 LAB — COMPREHENSIVE METABOLIC PANEL
ALT: 11 U/L — ABNORMAL LOW (ref 17–63)
ANION GAP: 9 (ref 5–15)
AST: 24 U/L (ref 15–41)
Albumin: 3.5 g/dL (ref 3.5–5.0)
Alkaline Phosphatase: 73 U/L (ref 38–126)
BILIRUBIN TOTAL: 0.6 mg/dL (ref 0.3–1.2)
BUN: 18 mg/dL (ref 6–20)
CO2: 22 mmol/L (ref 22–32)
Calcium: 8.9 mg/dL (ref 8.9–10.3)
Chloride: 106 mmol/L (ref 101–111)
Creatinine, Ser: 1.15 mg/dL (ref 0.61–1.24)
GFR, EST NON AFRICAN AMERICAN: 55 mL/min — AB (ref >60)
Glucose, Bld: 86 mg/dL (ref 65–99)
POTASSIUM: 4.1 mmol/L (ref 3.5–5.1)
Sodium: 137 mmol/L (ref 135–145)
TOTAL PROTEIN: 8.2 g/dL — AB (ref 6.5–8.1)

## 2015-10-27 LAB — PROTIME-INR
INR: 1.22
PROTHROMBIN TIME: 15.5 s — AB (ref 11.4–15.2)

## 2015-10-27 LAB — URINE MICROSCOPIC-ADD ON

## 2015-10-27 LAB — I-STAT TROPONIN, ED
TROPONIN I, POC: 0.01 ng/mL (ref 0.00–0.08)
Troponin i, poc: 0.01 ng/mL (ref 0.00–0.08)

## 2015-10-27 LAB — CBG MONITORING, ED: Glucose-Capillary: 86 mg/dL (ref 65–99)

## 2015-10-27 NOTE — ED Provider Notes (Signed)
[facility]EMERGENCY DEPT Provider Note   CSN: [id number] Arrival date & time: 10/27/15  1515     History   Chief Complaint Chief Complaint  Patient presents with  . Loss of Consciousness    HPI Jeremy [name] is a 82 y.o. male with a hx of CAD, HTN, with foley catheter in place due to urinary retention, and intermittent hypotension precipitating syncope with extensive prior workups during previous admissions including EP evaluations, thought to be neurally mediated, presents after he had another syncopal episode while sitting at rest eating lunch. Today he states that he was sitting talking to his caregivers when he suddenly felt nauseous, vomited non-bloody emesis, and syncopized. No noted seizure activity, fall, head injury. He just slumped forward in his chair, similar to previous episodes of syncope. He awoke about a minute later with no noted post-ictal-like symptoms, returning to his neurologic baseline.  He denies any preceding palpitations, SOB, chest tightness, chest pain, headache. He states that he was feeling fine before this, in his normal state of health.   He was recently found to have hematuria which he has a history of, following with urologist. His foley was replaced today shortly prior to his syncopal episode.   HPI  Past Medical History:  Diagnosis Date  . CAD (coronary artery disease)   . Dementia   . HTN (hypertension)   . Hyperlipidemia   . Hypotension   . Hypothyroidism    s/p Thyroidectomy, partial  . Sepsis (HCC)   . Syncope    4/11 (hospitalized) and 4/13  . Syncope   . UTI (lower urinary tract infection)     Patient Active Problem List   Diagnosis Date Noted  . Syncope 08/27/2015  . Hyperkalemia 08/26/2015  . Hypotension 08/25/2015  . Vasovagal near syncope 08/25/2015  . Nausea & vomiting 08/25/2015  . SIRS (systemic inflammatory response syndrome) (HCC) 06/08/2015  . Orthostatic hypotension 06/01/2015  . Confusion   . Urinary tract infectious  disease   . UTI (urinary tract infection) due to urinary indwelling catheter (HCC) 04/19/2015  . Pressure ulcer 04/19/2015  . Syncope and collapse 04/15/2015  . Syncopal episodes 04/15/2015  . AKI (acute kidney injury) (HCC) 04/14/2015  . Abnormality of gait 02/25/2015  . Palliative care encounter 12/10/2014  . Protein-calorie malnutrition, severe 12/06/2014  . Aspiration pneumonia of right lower lobe due to regurgitated food (HCC) 12/06/2014  . CKD (chronic kidney disease) stage 3, GFR 30-59 ml/min 12/06/2014  . Anemia of chronic renal failure, stage 3 (moderate) 12/06/2014  . Dyslipidemia 12/06/2014  . Coronary artery disease involving native coronary artery of native heart without angina pectoris 12/06/2014  . BPH (benign prostatic hyperplasia) 12/06/2014  . Chronic diastolic congestive heart failure (HCC) 12/06/2014  . Depression 12/06/2014  . HCAP (healthcare-associated pneumonia) 12/05/2014  . Dementia with behavioral disturbance 11/23/2014  . Acute encephalopathy 11/06/2014  . UTI (lower urinary tract infection) 11/06/2014  . Hypothyroidism 01/26/2014    Past Surgical History:  Procedure Laterality Date  . BACK SURGERY    . CARDIAC CATHETERIZATION  02/25/2002   Proximal L Circumflex 95% lesion, stented w/ 3x18-mm CYPHER stent avoiding the 2 L Marginal branch, jailing the 1st marginal, resulting in reduction of a 90-95% lesion to 0% residual  . CARDIOVASCULAR STRESS TEST  12/18/2006   EKG negative for ischemia, no significant ischemia demonstrated.  . CORONARY ANGIOPLASTY WITH STENT PLACEMENT    . RENAL DOPPLER  08/17/2005   Normal evaluation  . THYROIDECTOMY, PARTIAL    .  TRANSTHORACIC ECHOCARDIOGRAM  03/13/2002   EF >55%, moderate LVH,        Home Medications    Prior to Admission medications   Medication Sig Start Date End Date Taking? Authorizing Provider  aspirin EC 81 MG tablet Take 81 mg by mouth every morning.    Yes Historical Provider, MD  clopidogrel  (PLAVIX) 75 MG tablet Take 75 mg by mouth every morning.    Yes Historical Provider, MD  CRANBERRY PO Take 1 tablet by mouth every evening.    Yes Historical Provider, MD  DOK 100 MG capsule Take 100 mg by mouth at bedtime. 10/08/15  Yes Historical Provider, MD  dorzolamide (TRUSOPT) 2 % ophthalmic solution Place 1 drop into the right eye 2 (two) times daily.  08/23/12  Yes Historical Provider, MD  finasteride (PROSCAR) 5 MG tablet Take 5 mg by mouth every evening.    Yes Historical Provider, MD  fludrocortisone (FLORINEF) 0.1 MG tablet Take 0.5 tablets (0.05 mg total) by mouth every morning. 08/29/15  Yes Rhonda G Barrett, PA-C  latanoprost (XALATAN) 0.005 % ophthalmic solution Place 1 drop into both eyes at bedtime.   Yes Historical Provider, MD  levothyroxine (SYNTHROID, LEVOTHROID) 75 MCG tablet Take 75 mcg by mouth daily before breakfast.  11/06/14  Yes Historical Provider, MD  meloxicam (MOBIC) 15 MG tablet Take 15 mg by mouth every morning.  02/17/15  Yes Historical Provider, MD  midodrine (PROAMATINE) 5 MG tablet Take 1 tablet (5 mg total) by mouth 3 (three) times daily with meals. 05/04/15  Yes Dwana Melena, PA-C  OVER THE COUNTER MEDICATION Take 1 tablet by mouth every Monday, Wednesday, and Friday. With lunch.  Drop 1 tablet in water daily and drink- Over the counter [employer]   Yes Historical Provider, MD  QUEtiapine (SEROQUEL) 25 MG tablet Take 1 tablet (25 mg total) by mouth at bedtime. For behaviors Patient taking differently: Take 25 mg by mouth at bedtime.  02/25/15  Yes Levert Feinstein, MD  ranitidine (ZANTAC) 75 MG tablet Take 75 mg by mouth every morning. Reported on 06/08/2015   Yes Historical Provider, MD  simvastatin (ZOCOR) 40 MG tablet Take 40 mg by mouth daily at 6 PM.    Yes Historical Provider, MD  timolol (TIMOPTIC) 0.5 % ophthalmic solution Place 1 drop into both eyes 2 (two) times daily.  09/18/12  Yes Historical Provider, MD  Vitamin D, Ergocalciferol, (DRISDOL) 50000 units CAPS  capsule Take 50,000 Units by mouth 2 (two) times a week. Monday and Thursday in the evening   Yes Historical Provider, MD  ciprofloxacin (CIPRO) 500 MG tablet Take 1 tablet (500 mg total) by mouth 2 (two) times daily. Patient not taking: Reported on 10/27/2015 09/25/15   Earley Favor, NP    Family History Family History  Problem Relation Age of Onset  . Family history unknown: Yes    Social History Social History  Substance Use Topics  . Smoking status: Former Games developer  . Smokeless tobacco: Never Used     Comment: Quit 50+ years ago.  . Alcohol use No     Allergies   Tramadol   Review of Systems Review of Systems  Constitutional: Positive for appetite change. Negative for activity change, chills and fever.  Respiratory: Negative for cough, chest tightness and shortness of breath.   Cardiovascular: Negative for chest pain and palpitations.  Gastrointestinal: Positive for nausea and vomiting. Negative for abdominal pain and diarrhea.  Genitourinary: Positive for hematuria. Negative for flank pain.  Chronic foley in place  Musculoskeletal: Negative for back pain, neck pain and neck stiffness.  Skin: Negative for rash.  Neurological: Positive for syncope. Negative for seizures, speech difficulty, weakness, light-headedness, numbness and headaches.  Psychiatric/Behavioral: Negative for confusion.  All other systems reviewed and are negative.    Physical Exam Updated Vital Signs BP 146/87   Pulse 83   Temp 97.5 F (36.4 C) (Oral)   Resp (!) 6   SpO2 100%   Physical Exam  Constitutional: He is oriented to person, place, and time. He appears well-developed. No distress.  Mildly cachectic and frail appearing, pleasant and cooperative  HENT:  Head: Normocephalic and atraumatic.  Nose: Nose normal.  Mouth/Throat: Oropharynx is clear and moist.  Eyes: Conjunctivae and EOM are normal. Pupils are equal, round, and reactive to light.  Hx of glaucoma and baseline very poor  eye sight  Neck: Normal range of motion. Neck supple.  Cardiovascular: Normal rate, regular rhythm, normal heart sounds and intact distal pulses.   Pulmonary/Chest: Effort normal and breath sounds normal.  Abdominal: Soft. He exhibits no distension. There is no tenderness.  Genitourinary:  Genitourinary Comments: Foley in place with dark colored urine  Musculoskeletal: He exhibits no edema or tenderness.  Neurological: He is alert and oriented to person, place, and time. He has normal strength. No cranial nerve deficit or sensory deficit. Coordination normal. GCS eye subscore is 4. GCS verbal subscore is 5. GCS motor subscore is 6.  5/5 strength in all 4 extremities, no focal deficits noted. No dysmetria.  Skin: Skin is warm and dry. No rash noted. He is not diaphoretic.  Nursing note and vitals reviewed.    ED Treatments / Results  Labs (all labs ordered are listed, but only abnormal results are displayed) Labs Reviewed  CBC WITH DIFFERENTIAL/PLATELET - Abnormal; Notable for the following:       Result Value   WBC 11.4 (*)    Hemoglobin 12.2 (*)    HCT 37.2 (*)    Neutro Abs 8.5 (*)    All other components within normal limits  COMPREHENSIVE METABOLIC PANEL - Abnormal; Notable for the following:    Total Protein 8.2 (*)    ALT 11 (*)    GFR calc non Af Amer 55 (*)    All other components within normal limits  PROTIME-INR - Abnormal; Notable for the following:    Prothrombin Time 15.5 (*)    All other components within normal limits  URINALYSIS, ROUTINE W REFLEX MICROSCOPIC (NOT AT [facility]) - Abnormal; Notable for the following:    APPearance TURBID (*)    Hgb urine dipstick LARGE (*)    Protein, ur 100 (*)    Leukocytes, UA LARGE (*)    All other components within normal limits  URINE MICROSCOPIC-ADD ON - Abnormal; Notable for the following:    Squamous Epithelial / LPF 0-5 (*)    Bacteria, UA FEW (*)    All other components within normal limits  POCT CBG (FASTING -  GLUCOSE)-MANUAL ENTRY  I-STAT TROPOININ, ED  CBG MONITORING, ED  I-STAT TROPOININ, ED    EKG  EKG Interpretation  Date/Time:  Wednesday October 27 2015 15:25:21 EDT Ventricular Rate:  71 PR Interval:    QRS Duration: 117 QT Interval:  421 QTC Calculation: 458 R Axis:   -22 Text Interpretation:  Sinus rhythm Incomplete RBBB and LAFB Anterior infarct, old No significant change since last tracing anterior st elevation present on prior ECG Confirmed by [facility]  MD-J, Cletis Athens ([id number]) on 10/27/2015 3:29:22 PM Also confirmed by [facility]  MD-J, JON [phone]), editor Whitney Post, Cala Bradford [phone])  on 10/27/2015 4:17:20 PM       Radiology Dg Chest 2 View  Result Date: 10/27/2015 CLINICAL DATA:  Syncope. EXAM: CHEST  2 VIEW COMPARISON:  08/27/2015 FINDINGS: The heart size and mediastinal contours are within normal limits. Slight uncoiling of the thoracic aorta. Both lungs hyperinflated in appearance but clear. The visualized skeletal structures are unremarkable. IMPRESSION: No active cardiopulmonary disease. Electronically Signed   By: Tollie Eth M.D.   On: 10/27/2015 17:17   Ct Head Wo Contrast  Result Date: 10/27/2015 CLINICAL DATA:  Recent syncopal event EXAM: CT HEAD WITHOUT CONTRAST TECHNIQUE: Contiguous axial images were obtained from the base of the skull through the vertex without intravenous contrast. COMPARISON:  06/27/2015 FINDINGS: Brain: No evidence of acute infarction, hemorrhage, hydrocephalus, extra-axial collection or mass lesion/mass effect. Chronic atrophic and ischemic changes are noted. Vascular: No hyperdense vessel or unexpected calcification. Skull: Normal. Negative for fracture or focal lesion. Sinuses/Orbits: No acute finding. Other: None. IMPRESSION: Chronic atrophic and ischemic changes without acute abnormality. Electronically Signed   By: Alcide Clever M.D.   On: 10/27/2015 17:35    Procedures Procedures (including critical care time)  Medications Ordered in ED Medications - No  data to display   Initial Impression / Assessment and Plan / ED Course  I have reviewed the triage vital signs and the nursing notes.  Pertinent labs & imaging results that were available during my care of the patient were reviewed by me and considered in my medical decision making (see chart for details).  Clinical Course   82 y.o. male with hx of syncope s/p extensive workup, on florinef, and midodrine, presents to the ED after he had another syncopal episode very similar to previous. Exam reassurring, as above with no focal neurologic deficits.   Labs were drawn and returned showing no significant abnormalities. Delta trop negative.   CT head was done and showed no acute intracranial abnormalities.  With reassuring exam and workup, and history of identical episodes previously with negative workups, I have a low suspicion for any new process. Feel that this is likely neurally mediated syncope. He was recommended to continue taking his current medications and to follow up closely with his primary care physician for further evaluation of his sx.  This plan was discussed with the patient and his family members at the bedside and they stated both understanding and agreement with this plan.    Final Clinical Impressions(s) / ED Diagnoses   Final diagnoses:  Syncope, unspecified syncope type    New Prescriptions Discharge Medication List as of 10/27/2015  8:45 PM       Francoise Ceo, DO 10/28/15 1502    Linwood Dibbles, MD 10/28/15 [phone]

## 2015-10-27 NOTE — ED Notes (Signed)
Pt transporting to xray.  

## 2015-10-27 NOTE — ED Triage Notes (Signed)
Pt to ER BIB [facility] from home for evaluation of syncopal episode that lasted approximately 1 minute witnessed by nurse aide. Pt has hx of same 4 mos prior to this visit. Nurse aide reports when patient lost consciousness he fell into the wall, did not fall and hit his head, was lowered to the ground. When came to, patient was a/o x4 per EMS but began vomiting. Received 8 mg zofran in route and 100 cc NS. EMS reports initial BP 80/50, HR 70. Pt arrives with foley catheter in place. Denies pain.

## 2015-11-02 ENCOUNTER — Other Ambulatory Visit: Payer: Self-pay

## 2015-11-02 MED ORDER — MIDODRINE HCL 5 MG PO TABS: 5.0000 mg | ORAL_TABLET | Freq: Three times a day (TID) | ORAL | 3 refills | Status: DC

## 2015-11-23 ENCOUNTER — Inpatient Hospital Stay (HOSPITAL_COMMUNITY)
Admission: EM | Admit: 2015-11-23 | Discharge: 2015-11-28 | DRG: 813 | Disposition: A | Discharge status: Home / Self Care | Payer: Medicare Other | Attending: Internal Medicine | Admitting: Internal Medicine

## 2015-11-23 ENCOUNTER — Encounter (HOSPITAL_COMMUNITY): Payer: Self-pay

## 2015-11-23 DIAGNOSIS — R310 Gross hematuria: Secondary | ICD-10-CM | POA: Diagnosis present

## 2015-11-23 DIAGNOSIS — S3121XA Laceration without foreign body of penis, initial encounter: Secondary | ICD-10-CM | POA: Diagnosis present

## 2015-11-23 DIAGNOSIS — Z66 Do not resuscitate: Secondary | ICD-10-CM | POA: Diagnosis present

## 2015-11-23 DIAGNOSIS — N183 Chronic kidney disease, stage 3 unspecified: Secondary | ICD-10-CM | POA: Diagnosis present

## 2015-11-23 DIAGNOSIS — F03918 Unspecified dementia, unspecified severity, with other behavioral disturbance: Secondary | ICD-10-CM | POA: Diagnosis present

## 2015-11-23 DIAGNOSIS — D6832 Hemorrhagic disorder due to extrinsic circulating anticoagulants: Principal | ICD-10-CM | POA: Diagnosis present

## 2015-11-23 DIAGNOSIS — Z955 Presence of coronary angioplasty implant and graft: Secondary | ICD-10-CM

## 2015-11-23 DIAGNOSIS — F0391 Unspecified dementia with behavioral disturbance: Secondary | ICD-10-CM | POA: Diagnosis present

## 2015-11-23 DIAGNOSIS — E785 Hyperlipidemia, unspecified: Secondary | ICD-10-CM | POA: Diagnosis present

## 2015-11-23 DIAGNOSIS — T39015A Adverse effect of aspirin, initial encounter: Secondary | ICD-10-CM | POA: Diagnosis present

## 2015-11-23 DIAGNOSIS — Z791 Long term (current) use of non-steroidal anti-inflammatories (NSAID): Secondary | ICD-10-CM

## 2015-11-23 DIAGNOSIS — I130 Hypertensive heart and chronic kidney disease with heart failure and stage 1 through stage 4 chronic kidney disease, or unspecified chronic kidney disease: Secondary | ICD-10-CM | POA: Diagnosis present

## 2015-11-23 DIAGNOSIS — R338 Other retention of urine: Secondary | ICD-10-CM | POA: Diagnosis present

## 2015-11-23 DIAGNOSIS — I9589 Other hypotension: Secondary | ICD-10-CM | POA: Diagnosis present

## 2015-11-23 DIAGNOSIS — E890 Postprocedural hypothyroidism: Secondary | ICD-10-CM | POA: Diagnosis present

## 2015-11-23 DIAGNOSIS — Y846 Urinary catheterization as the cause of abnormal reaction of the patient, or of later complication, without mention of misadventure at the time of the procedure: Secondary | ICD-10-CM | POA: Diagnosis present

## 2015-11-23 DIAGNOSIS — D631 Anemia in chronic kidney disease: Secondary | ICD-10-CM | POA: Diagnosis present

## 2015-11-23 DIAGNOSIS — Z7902 Long term (current) use of antithrombotics/antiplatelets: Secondary | ICD-10-CM

## 2015-11-23 DIAGNOSIS — N401 Enlarged prostate with lower urinary tract symptoms: Secondary | ICD-10-CM | POA: Diagnosis present

## 2015-11-23 DIAGNOSIS — R296 Repeated falls: Secondary | ICD-10-CM | POA: Diagnosis present

## 2015-11-23 DIAGNOSIS — Z885 Allergy status to narcotic agent status: Secondary | ICD-10-CM

## 2015-11-23 DIAGNOSIS — Z681 Body mass index (BMI) 19 or less, adult: Secondary | ICD-10-CM

## 2015-11-23 DIAGNOSIS — Z9079 Acquired absence of other genital organ(s): Secondary | ICD-10-CM

## 2015-11-23 DIAGNOSIS — Z7982 Long term (current) use of aspirin: Secondary | ICD-10-CM

## 2015-11-23 DIAGNOSIS — E039 Hypothyroidism, unspecified: Secondary | ICD-10-CM | POA: Diagnosis present

## 2015-11-23 DIAGNOSIS — E43 Unspecified severe protein-calorie malnutrition: Secondary | ICD-10-CM | POA: Diagnosis present

## 2015-11-23 DIAGNOSIS — I2510 Atherosclerotic heart disease of native coronary artery without angina pectoris: Secondary | ICD-10-CM | POA: Diagnosis present

## 2015-11-23 DIAGNOSIS — D62 Acute posthemorrhagic anemia: Secondary | ICD-10-CM | POA: Diagnosis present

## 2015-11-23 DIAGNOSIS — Z87891 Personal history of nicotine dependence: Secondary | ICD-10-CM

## 2015-11-23 DIAGNOSIS — T45525A Adverse effect of antithrombotic drugs, initial encounter: Secondary | ICD-10-CM | POA: Diagnosis present

## 2015-11-23 DIAGNOSIS — R319 Hematuria, unspecified: Secondary | ICD-10-CM | POA: Diagnosis present

## 2015-11-23 DIAGNOSIS — I5032 Chronic diastolic (congestive) heart failure: Secondary | ICD-10-CM | POA: Diagnosis present

## 2015-11-23 NOTE — ED Notes (Signed)
Bed: WA20 Expected date:  Expected time:  Means of arrival:  Comments: Bleeding from catheter

## 2015-11-23 NOTE — ED Notes (Signed)
Pt is from home and has had blood in his catheter since Sunday, Home Health came today and looked at it and it appears that he has torn something. Pt doesn't complain of any pain but is weak.

## 2015-11-24 DIAGNOSIS — I2510 Atherosclerotic heart disease of native coronary artery without angina pectoris: Secondary | ICD-10-CM | POA: Diagnosis not present

## 2015-11-24 DIAGNOSIS — D62 Acute posthemorrhagic anemia: Secondary | ICD-10-CM | POA: Diagnosis not present

## 2015-11-24 DIAGNOSIS — R319 Hematuria, unspecified: Secondary | ICD-10-CM | POA: Diagnosis not present

## 2015-11-24 DIAGNOSIS — N183 Chronic kidney disease, stage 3 (moderate): Secondary | ICD-10-CM | POA: Diagnosis not present

## 2015-11-24 DIAGNOSIS — D631 Anemia in chronic kidney disease: Secondary | ICD-10-CM

## 2015-11-24 LAB — CBC
HCT: 24.1 % — ABNORMAL LOW (ref 39.0–52.0)
Hemoglobin: 8 g/dL — ABNORMAL LOW (ref 13.0–17.0)
MCH: 29 pg (ref 26.0–34.0)
MCHC: 33.2 g/dL (ref 30.0–36.0)
MCV: 87.3 fL (ref 78.0–100.0)
PLATELETS: 178 10*3/uL (ref 150–400)
RBC: 2.76 MIL/uL — ABNORMAL LOW (ref 4.22–5.81)
RDW: 15.2 % (ref 11.5–15.5)
WBC: 8.8 10*3/uL (ref 4.0–10.5)

## 2015-11-24 LAB — BASIC METABOLIC PANEL
Anion gap: 7 (ref 5–15)
BUN: 22 mg/dL — AB (ref 6–20)
CALCIUM: 9.1 mg/dL (ref 8.9–10.3)
CO2: 23 mmol/L (ref 22–32)
CREATININE: 1.01 mg/dL (ref 0.61–1.24)
Chloride: 107 mmol/L (ref 101–111)
GFR calc non Af Amer: >60 mL/min (ref >60)
Glucose, Bld: 109 mg/dL — ABNORMAL HIGH (ref 65–99)
Potassium: 3.8 mmol/L (ref 3.5–5.1)
SODIUM: 137 mmol/L (ref 135–145)

## 2015-11-24 LAB — CBC WITH DIFFERENTIAL/PLATELET
BASOS PCT: 0 %
Basophils Absolute: 0 10*3/uL (ref 0.0–0.1)
EOS ABS: 0.2 10*3/uL (ref 0.0–0.7)
Eosinophils Relative: 1 %
HCT: 29.1 % — ABNORMAL LOW (ref 39.0–52.0)
HEMOGLOBIN: 9.6 g/dL — AB (ref 13.0–17.0)
Lymphocytes Relative: 21 %
Lymphs Abs: 2.4 10*3/uL (ref 0.7–4.0)
MCH: 28.4 pg (ref 26.0–34.0)
MCHC: 33 g/dL (ref 30.0–36.0)
MCV: 86.1 fL (ref 78.0–100.0)
Monocytes Absolute: 1.4 10*3/uL — ABNORMAL HIGH (ref 0.1–1.0)
Monocytes Relative: 12 %
NEUTROS PCT: 66 %
Neutro Abs: 7.7 10*3/uL (ref 1.7–7.7)
Platelets: 222 10*3/uL (ref 150–400)
RBC: 3.38 MIL/uL — AB (ref 4.22–5.81)
RDW: 14.8 % (ref 11.5–15.5)
WBC: 11.7 10*3/uL — AB (ref 4.0–10.5)

## 2015-11-24 LAB — MRSA PCR SCREENING: MRSA by PCR: POSITIVE — AB

## 2015-11-24 LAB — ABO/RH: ABO/RH(D): O POS

## 2015-11-24 MED ORDER — TIMOLOL MALEATE 0.5 % OP SOLN
1.0000 [drp] | Freq: Two times a day (BID) | OPHTHALMIC | Status: DC
  Administered 2015-11-24 – 2015-11-28 (×9): 1 [drp] via OPHTHALMIC
  Filled 2015-11-24: qty 5

## 2015-11-24 MED ORDER — ONDANSETRON HCL 4 MG PO TABS: 4.0000 mg | ORAL_TABLET | Freq: Four times a day (QID) | ORAL | Status: DC | PRN

## 2015-11-24 MED ORDER — QUETIAPINE FUMARATE 25 MG PO TABS
25.0000 mg | ORAL_TABLET | Freq: Every day | ORAL | Status: DC
  Administered 2015-11-24 – 2015-11-27 (×4): 25 mg via ORAL
  Filled 2015-11-24 (×3): qty 1

## 2015-11-24 MED ORDER — FAMOTIDINE 20 MG PO TABS
20.0000 mg | ORAL_TABLET | Freq: Every day | ORAL | Status: DC
  Administered 2015-11-24 – 2015-11-28 (×5): 20 mg via ORAL
  Filled 2015-11-24 (×5): qty 1

## 2015-11-24 MED ORDER — DORZOLAMIDE HCL 2 % OP SOLN
1.0000 [drp] | Freq: Two times a day (BID) | OPHTHALMIC | Status: DC
  Administered 2015-11-24 – 2015-11-28 (×9): 1 [drp] via OPHTHALMIC
  Filled 2015-11-24: qty 10

## 2015-11-24 MED ORDER — ACETAMINOPHEN 325 MG PO TABS
650.0000 mg | ORAL_TABLET | Freq: Four times a day (QID) | ORAL | Status: DC | PRN
  Administered 2015-11-25: 650 mg via ORAL
  Filled 2015-11-24: qty 2

## 2015-11-24 MED ORDER — SIMVASTATIN 40 MG PO TABS
40.0000 mg | ORAL_TABLET | Freq: Every day | ORAL | Status: DC
  Administered 2015-11-24 – 2015-11-27 (×4): 40 mg via ORAL
  Filled 2015-11-24 (×4): qty 1

## 2015-11-24 MED ORDER — FINASTERIDE 5 MG PO TABS
5.0000 mg | ORAL_TABLET | Freq: Every evening | ORAL | Status: DC
  Administered 2015-11-24 – 2015-11-27 (×4): 5 mg via ORAL
  Filled 2015-11-24 (×4): qty 1

## 2015-11-24 MED ORDER — ONDANSETRON HCL 4 MG/2ML IJ SOLN: 4.0000 mg | Freq: Four times a day (QID) | INTRAMUSCULAR | Status: DC | PRN

## 2015-11-24 MED ORDER — MIDODRINE HCL 5 MG PO TABS
5.0000 mg | ORAL_TABLET | Freq: Three times a day (TID) | ORAL | Status: DC
  Administered 2015-11-24 – 2015-11-28 (×13): 5 mg via ORAL
  Filled 2015-11-24 (×16): qty 1

## 2015-11-24 MED ORDER — LATANOPROST 0.005 % OP SOLN
1.0000 [drp] | Freq: Every day | OPHTHALMIC | Status: DC
  Administered 2015-11-24 – 2015-11-27 (×4): 1 [drp] via OPHTHALMIC
  Filled 2015-11-24: qty 2.5

## 2015-11-24 MED ORDER — ACETAMINOPHEN 650 MG RE SUPP: 650.0000 mg | Freq: Four times a day (QID) | RECTAL | Status: DC | PRN

## 2015-11-24 MED ORDER — FLUDROCORTISONE ACETATE 0.1 MG PO TABS
0.0500 mg | ORAL_TABLET | Freq: Every morning | ORAL | Status: DC
  Administered 2015-11-24 – 2015-11-28 (×5): 0.05 mg via ORAL
  Filled 2015-11-24: qty 1
  Filled 2015-11-24: qty 0.5
  Filled 2015-11-24 (×2): qty 1
  Filled 2015-11-24: qty 0.5

## 2015-11-24 MED ORDER — LEVOTHYROXINE SODIUM 75 MCG PO TABS
75.0000 ug | ORAL_TABLET | Freq: Every day | ORAL | Status: DC
  Administered 2015-11-24 – 2015-11-28 (×5): 75 ug via ORAL
  Filled 2015-11-24 (×5): qty 1

## 2015-11-24 NOTE — ED Notes (Signed)
Urology at bedside. Urology cart provided at request for procedure

## 2015-11-24 NOTE — ED Notes (Signed)
[name] reports noticing blood in his catheter bag Sunday, reported it to his doctor [facility].  Per daughter, he has a permanent foley catheter which is replaced every month.  Daughter reports blood never subsided and also, HH nurse noticed bloody urine leaking around the catheter.  Also noted, lac below the tip of  His penis which could be d/t the foley getting pulled.

## 2015-11-24 NOTE — ED Notes (Signed)
Unable to take patient to floor within limit d/t urologist being in room doing a procedure

## 2015-11-24 NOTE — ED Notes (Signed)
CBI continues. Red urine continues to drain from bladder. Patient denies pain or discomfort. Family member remains at bedside

## 2015-11-24 NOTE — Consult Note (Signed)
Reason for Consult: Recurrent Gross Hematuria with Anemia, Enlarged Prostate with Urinary Retention, Traumatic Hypospdias  Referring Physician: Everlene Balls, MD  Jeremy [name] is an 82 y.o. male.   HPI:   1 - Recurrent Gross Hematuria with Anemia - on/off gross hematuria with clots 2017 presumably from prostate source requiring several admissions. He is on ASA and plavix for very remote history of ischemic heart disease (not limiting now x years). Family noted clot retention with only urine aroudn catheter PM 11/21 and presented to ER. No new foley trauma.   2 -  Enlarged Prostate with Urinary Retention - h/o TURP years ago. No with refracoty retention 2017 having failed trial of void x several and catheter dependant. Considering SP Tube.   3 - Traumatic Hypospdias - traumatic hypospdias on exam only involving glans 2/2 chronic foley.   PMH sig for orthostasis / falls (worsening, pt moving in with daughter soon), failure to thrive, CAD.  Past Medical History:  Diagnosis Date  . CAD (coronary artery disease)   . Dementia   . HTN (hypertension)   . Hyperlipidemia   . Hypotension   . Hypothyroidism    s/p Thyroidectomy, partial  . Sepsis ([place])   . Syncope    4/11 (hospitalized) and 4/13  . Syncope   . UTI (lower urinary tract infection)     Past Surgical History:  Procedure Laterality Date  . BACK SURGERY    . CARDIAC CATHETERIZATION  02/25/2002   Proximal L Circumflex 95% lesion, stented w/ 3x18-mm CYPHER stent avoiding the 2 L Marginal branch, jailing the 1st marginal, resulting in reduction of a 90-95% lesion to 0% residual  . CARDIOVASCULAR STRESS TEST  12/18/2006   EKG negative for ischemia, no significant ischemia demonstrated.  . CORONARY ANGIOPLASTY WITH STENT PLACEMENT    . RENAL DOPPLER  08/17/2005   Normal evaluation  . THYROIDECTOMY, PARTIAL    . TRANSTHORACIC ECHOCARDIOGRAM  03/13/2002   EF >55%, moderate LVH,     Family History  Problem Relation Age of Onset   . Family history unknown: Yes    Social History:  reports that he has quit smoking. He has never used smokeless tobacco. He reports that he does not drink alcohol or use drugs.  Allergies:  Allergies  Allergen Reactions  . Tramadol Nausea And Vomiting    Medications: I have reviewed the patient's current medications.  Results for orders placed or performed during the hospital encounter of 11/23/15 (from the past 48 hour(s))  CBC with Differential/Platelet     Status: Abnormal   Collection Time: 11/24/15  4:09 AM  Result Value Ref Range   WBC 11.7 (H) 4.0 - 10.5 K/uL   RBC 3.38 (L) 4.22 - 5.81 MIL/uL   Hemoglobin 9.6 (L) 13.0 - 17.0 g/dL   HCT 29.1 (L) 39.0 - 52.0 %   MCV 86.1 78.0 - 100.0 fL   MCH 28.4 26.0 - 34.0 pg   MCHC 33.0 30.0 - 36.0 g/dL   RDW 14.8 11.5 - 15.5 %   Platelets 222 150 - 400 K/uL   Neutrophils Relative % 66 %   Neutro Abs 7.7 1.7 - 7.7 K/uL   Lymphocytes Relative 21 %   Lymphs Abs 2.4 0.7 - 4.0 K/uL   Monocytes Relative 12 %   Monocytes Absolute 1.4 (H) 0.1 - 1.0 K/uL   Eosinophils Relative 1 %   Eosinophils Absolute 0.2 0.0 - 0.7 K/uL   Basophils Relative 0 %   Basophils Absolute 0.0  0.0 - 0.1 K/uL  Basic metabolic panel     Status: Abnormal   Collection Time: 11/24/15  4:09 AM  Result Value Ref Range   Sodium 137 135 - 145 mmol/L   Potassium 3.8 3.5 - 5.1 mmol/L   Chloride 107 101 - 111 mmol/L   CO2 23 22 - 32 mmol/L   Glucose, Bld 109 (H) 65 - 99 mg/dL   BUN 22 (H) 6 - 20 mg/dL   Creatinine, Ser 1.01 0.61 - 1.24 mg/dL   Calcium 9.1 8.9 - 10.3 mg/dL   GFR calc non Af Amer >60 >60 mL/min   GFR calc Af Amer >60 >60 mL/min    Comment: (NOTE) The eGFR has been calculated using the CKD EPI equation. This calculation has not been validated in all clinical situations. eGFR's persistently <60 mL/min signify possible Chronic Kidney Disease.    Anion gap 7 5 - 15  Type and screen     Status: None   Collection Time: 11/24/15  4:10 AM  Result  Value Ref Range   ABO/RH(D) O POS    Antibody Screen NEG    Sample Expiration 11/27/2015   ABO/Rh     Status: None   Collection Time: 11/24/15  4:10 AM  Result Value Ref Range   ABO/RH(D) O POS     No results found.  Review of Systems  Constitutional: Positive for malaise/fatigue. Negative for chills and fever.  HENT: Negative.   Eyes: Negative.   Respiratory: Negative.   Cardiovascular: Negative.  Negative for chest pain.  Gastrointestinal: Positive for nausea.  Genitourinary: Positive for hematuria.  Musculoskeletal: Positive for falls.  Skin: Negative.   Neurological: Positive for dizziness, loss of consciousness and weakness.  Endo/Heme/Allergies: Bruises/bleeds easily.  Psychiatric/Behavioral: Positive for memory loss.   Blood pressure 125/79, pulse 85, temperature 98.3 F (36.8 C), temperature source Oral, resp. rate 14, height '5\' 10"'  (1.778 m), weight 61.7 kg (136 lb), SpO2 100 %. Physical Exam  Constitutional: He appears well-developed.  Elderly, very pleasant in ER. Daughter at bedside. Some visible weight loss.   HENT:  Head: Normocephalic.  Eyes: Pupils are equal, round, and reactive to light.  Neck: Normal range of motion.  Cardiovascular: Normal rate.   Respiratory: Effort normal.  GI: Soft.  No distended bladder at present.   Genitourinary:  Genitourinary Comments: NO CVAT.  In situe 3 way hematuria foley with clogged blodo in tubing, trauamtic hypospadias that appears chronic.   Musculoskeletal: Normal range of motion.  Neurological: He is alert.  Skin: Skin is warm.  Psychiatric: He has a normal mood and affect.  Stigmata of mild dementia.    BEDSIDE IRRIGATION:  Using aseptic technique 3L NS irrigated in 60cc aliquots via in situ 3 way 32F hematuria catheter, evaculating all clots. 2 catheter straps placed with 30cc water in foley balloon and used to place catheter on tension then connected to NS irrigateion with efflus very light pink at 2gtt per  second. Family and NSG shown set up as this is to continue.  Assessment/Plan:  1 - Recurrent Gross Hematuria with Anemia - Likely from impressive BPH bleeding. Rec continue finasterde and stop all blood thinners. Pt and family understand this is balance of risk of blood loss v. Risk of CAD and we all agree risk of blood loss much greater at present. His recent falls also make him risky for antiplatelet therapy.  Now minimal hematuria after clot irrigation and NS irrigation with catheter on tension to tamponade prostate,  NSG and family instructed on importance of continuing.   2 -  Enlarged Prostate with Urinary Retention -  Plan as per above while in house. Agree with possible SPT eventually as that would likely irritate prostate fossa less than chronic foley.   3 - Traumatic Hypospdias - no evidence of superinfection, observe.  I will make Dr. Junious Silk aware of patient's admission.  Greatly appreciate hospitalist comangaement.    Isis Costanza 11/24/2015, 6:42 AM

## 2015-11-24 NOTE — ED Notes (Signed)
Stuck pt twice was unsuccessful Jeremy [name] Justin made aware.

## 2015-11-24 NOTE — Progress Notes (Signed)
Patient seen and examined. Admitted after midnight secondary to hematuria. Hemodynamically stable currently and in no distress. Patient has been seen by urology and recommended CBI. Hematuria most likely due to clots. Will stop ASA and Plavix. Discussed with family and in agreement. Please refer to H&P written by Dr. Maryfrances Bunnellanford for further info/details on admission.  Jeremy [name], Jeremy [name] [phone]

## 2015-11-24 NOTE — H&P (Signed)
History and Physical  Patient Name: Jeremy [name]     ONG:[id number]    DOB: 1925/02/23    DOA: 11/23/2015 PCP: [name]  Cardiology: Dr. Allyson Sabal     Patient coming from: Home  Chief Complaint: Hematuria  HPI: Jeremy [name] is a 82 y.o. male with a past medical history significant for mild dementia, CKD III, CAD s/p PCI in 2004 on Plavix and aspirin, chronic diastolic CHF, chronic hypotension on fluorinef and midodrine, and BPH with indwelling foley who presents with hematuria for 3 days.  The patient lives with his wife, walks with a walker, and has 24-hour companion, and was in his usual state of health until Sunday. This week and the family went to [facility] for a family get together and the patient was fine.  Sunday night when they got back, they noticed he had new bright red blood in his foley bag.  This persisted Monday, so the family called the urologist who sent urology nurse on Tuesday, who evaluated the foley and found only a small laceration of the patient's penis.  By this evening, the bleeding continued and the patient seemed weak, so family brought him to the ER.  ED course: -Afebrile, heart rate 90s, respirations pulses normal, blood pressure 140/75 -Na 137, K 3.8, Cr 1.01, WBC 11.7K, Hgb 9.6 (from previous baseline 12.2) -The case was discussed with Urology who recommended medical admission and agreed to consult, suspecting prostate bleeding      ROS: Review of Systems  Constitutional: Negative for chills and fever.  Gastrointestinal: Negative for abdominal pain.  Genitourinary: Positive for hematuria. Negative for flank pain.  All other systems reviewed and are negative.         Past Medical History:  Diagnosis Date  . CAD (coronary artery disease)   . Dementia   . HTN (hypertension)   . Hyperlipidemia   . Hypotension   . Hypothyroidism    s/p Thyroidectomy, partial  . Sepsis (HCC)   . Syncope    4/11 (hospitalized) and 4/13  . Syncope   . UTI  (lower urinary tract infection)     Past Surgical History:  Procedure Laterality Date  . BACK SURGERY    . CARDIAC CATHETERIZATION  02/25/2002   Proximal L Circumflex 95% lesion, stented w/ 3x18-mm CYPHER stent avoiding the 2 L Marginal branch, jailing the 1st marginal, resulting in reduction of a 90-95% lesion to 0% residual  . CARDIOVASCULAR STRESS TEST  12/18/2006   EKG negative for ischemia, no significant ischemia demonstrated.  . CORONARY ANGIOPLASTY WITH STENT PLACEMENT    . RENAL DOPPLER  08/17/2005   Normal evaluation  . THYROIDECTOMY, PARTIAL    . TRANSTHORACIC ECHOCARDIOGRAM  03/13/2002   EF >55%, moderate LVH,     Social History: Patient lives with his wife.  He has four daughters in [place].  The patient walks with a walker ebcasue of leg weakness.  He is a former smoker.    Allergies  Allergen Reactions  . Tramadol Nausea And Vomiting    Family history: Family history is unknown by patient.  Prior to Admission medications   Medication Sig Start Date End Date Taking? Authorizing Provider  aspirin EC 81 MG tablet Take 81 mg by mouth every morning.    Yes Historical Provider, MD  clopidogrel (PLAVIX) 75 MG tablet Take 75 mg by mouth every morning.    Yes Historical Provider, MD  CRANBERRY PO Take 1 tablet by mouth every evening.    Yes  Historical Provider, MD  DOK 100 MG capsule Take 100 mg by mouth at bedtime. 10/08/15  Yes Historical Provider, MD  dorzolamide (TRUSOPT) 2 % ophthalmic solution Place 1 drop into the right eye 2 (two) times daily.  08/23/12  Yes Historical Provider, MD  finasteride (PROSCAR) 5 MG tablet Take 5 mg by mouth every evening.    Yes Historical Provider, MD  fludrocortisone (FLORINEF) 0.1 MG tablet Take 0.5 tablets (0.05 mg total) by mouth every morning. 08/29/15  Yes Rhonda G Barrett, PA-C  latanoprost (XALATAN) 0.005 % ophthalmic solution Place 1 drop into both eyes at bedtime.   Yes Historical Provider, MD  levothyroxine (SYNTHROID,  LEVOTHROID) 75 MCG tablet Take 75 mcg by mouth daily before breakfast.  11/06/14  Yes Historical Provider, MD  meloxicam (MOBIC) 15 MG tablet Take 15 mg by mouth every morning.  02/17/15  Yes Historical Provider, MD  midodrine (PROAMATINE) 5 MG tablet Take 1 tablet (5 mg total) by mouth 3 (three) times daily with meals. 11/02/15  Yes Runell GessJonathan J Berry, MD  OVER THE COUNTER MEDICATION Take 1 tablet by mouth every Monday, Wednesday, and Friday. With lunch.  Drop 1 tablet in water daily and drink- Over the counter [employer]   Yes Historical Provider, MD  QUEtiapine (SEROQUEL) 25 MG tablet Take 1 tablet (25 mg total) by mouth at bedtime. For behaviors Patient taking differently: Take 25 mg by mouth at bedtime.  02/25/15  Yes Levert FeinsteinYijun Yan, MD  ranitidine (ZANTAC) 75 MG tablet Take 75 mg by mouth every morning. Reported on 06/08/2015   Yes Historical Provider, MD  simvastatin (ZOCOR) 40 MG tablet Take 40 mg by mouth daily at 6 PM.    Yes Historical Provider, MD  timolol (TIMOPTIC) 0.5 % ophthalmic solution Place 1 drop into both eyes 2 (two) times daily.  09/18/12  Yes Historical Provider, MD  ciprofloxacin (CIPRO) 500 MG tablet Take 1 tablet (500 mg total) by mouth 2 (two) times daily. Patient not taking: Reported on 11/23/2015 09/25/15   Earley FavorGail Schulz, NP  Vitamin D, Ergocalciferol, (DRISDOL) 50000 units CAPS capsule Take 50,000 Units by mouth 2 (two) times a week. Monday and Thursday in the evening    Historical Provider, MD       Physical Exam: BP 125/79   Pulse 85   Temp 98.3 F (36.8 C) (Oral)   Resp 14   Ht 5\' 10"  (1.778 m)   Wt 61.7 kg (136 lb)   SpO2 100%   BMI 19.51 kg/m  General appearance: Elderly adult male, alert and in no acute distress, tired appearing, weak.   Eyes: Anicteric, conjunctiva pink, lids and lashes normal. PERRL.    ENT: No nasal deformity, discharge, epistaxis.  Hearing slightly diminished. OP moist without lesions.   Neck: No neck masses.  Trachea midline.  No  thyromegaly/tenderness. Lymph: No cervical or supraclavicular lymphadenopathy. Skin: Warm and dry.  No jaundice.  No suspicious rashes or lesions. Cardiac: RRR, nl S1-S2, no murmurs appreciated.  Capillary refill is brisk. Respiratory: Normal respiratory rate and rhythm.  CTAB without rales or wheezes. Abdomen: Abdomen soft.  No TTP. No ascites, distension, hepatosplenomegaly.   MSK: No deformities or effusions.  No cyanosis or clubbing. Neuro: Cranial nerves normal.  Oriented to situation, place, person, month, not year. Speech is fluent.  Muscle strength globally and symmetrically diminished.    Psych: Sensorium intact and responding to questions, attention normal.  Behavior appropriate.  Affect normal.  Judgment and insight appear normal, mild dementia.  Labs on Admission:  I have personally reviewed following labs and imaging studies: CBC:  Recent Labs Lab 11/24/15 0409  WBC 11.7*  NEUTROABS 7.7  HGB 9.6*  HCT 29.1*  MCV 86.1  PLT 222   Basic Metabolic Panel:  Recent Labs Lab 11/24/15 0409  NA 137  K 3.8  CL 107  CO2 23  GLUCOSE 109*  BUN 22*  CREATININE 1.01  CALCIUM 9.1   GFR: Estimated Creatinine Clearance: 44.1 mL/min (by C-G formula based on SCr of 1.01 mg/dL).  Liver Function Tests: No results for input(s): AST, ALT, ALKPHOS, BILITOT, PROT, ALBUMIN in the last 168 hours. No results for input(s): LIPASE, AMYLASE in the last 168 hours. No results for input(s): AMMONIA in the last 168 hours. Coagulation Profile: No results for input(s): INR, PROTIME in the last 168 hours. Cardiac Enzymes: No results for input(s): CKTOTAL, CKMB, CKMBINDEX, TROPONINI in the last 168 hours. BNP (last 3 results) No results for input(s): PROBNP in the last 8760 hours. HbA1C: No results for input(s): HGBA1C in the last 72 hours. CBG: No results for input(s): GLUCAP in the last 168 hours. Lipid Profile: No results for input(s): CHOL, HDL, LDLCALC, TRIG, CHOLHDL, LDLDIRECT  in the last 72 hours. Thyroid Function Tests: No results for input(s): TSH, T4TOTAL, FREET4, T3FREE, THYROIDAB in the last 72 hours. Anemia Panel: No results for input(s): VITAMINB12, FOLATE, FERRITIN, TIBC, IRON, RETICCTPCT in the last 72 hours. Sepsis Labs: Invalid input(s): PROCALCITONIN, LACTICIDVEN No results found for this or any previous visit (from the past 240 hour(s)).       Radiological Exams on Admission: Personally reviewed: No results found.  EKG: Independently reviewed. Rate 91, QTc 461, STE in V2-3 were present on previous ECGs.    Assessment/Plan Principal Problem:   Hematuria Active Problems:   Hypothyroidism, acquired   Dementia with behavioral disturbance   Protein-calorie malnutrition, severe   CKD (chronic kidney disease) stage 3, GFR 30-59 ml/min   Anemia of chronic renal failure, stage 3 (moderate)   Coronary artery disease involving native coronary artery of native heart without angina pectoris   Acute blood loss anemia  1. Hematuria:  -Consult to Urology, appreciate cares   2. Acute blood loss anemia:  -Trend Hgb q12 hrs -Transfusion threshold 7-8 g/dL -Hold aspirin -Stop Plavix  3. Coronary disease:  Last stent >10 years ago. -Continue statin -Hold aspirin -Discontinue Plavix  4. Chronic hypotension:  -Continue Fluorinef and midodrine  5. Hypothyroidism:  -Continue levothyroxine  6. Other medications:  -Continue Proscar -Hold Mobic -Continue Seroquel QHS -Continue ranitidine -Continue eye drops       DVT prophylaxis: SCDs  Code Status: DO NOT RESUSCITATE  Family Communication: Daughter at bedside  Disposition Plan: Anticipate trend Hgb overnight and monitor ongoing blood loss clinically.  Foley care per Urology. Consults called: Urology has evaluated the patient Admission status: OBS, med surg At the point of initial evaluation, it is my clinical opinion that admission for OBSERVATION is reasonable and necessary because  the patient's presenting complaints in the context of their chronic conditions represent sufficient risk of deterioration or significant morbidity to constitute reasonable grounds for close observation in the hospital setting, but that the patient may be medically stable for discharge from the hospital within 24 to 48 hours.    Medical decision making: Patient seen at 6:02 AM on 11/24/2015.  The patient was discussed with Dr. Berneice HeinrichManny and Dr. Mora Bellmanni.  What exists of the patient's chart was reviewed in depth and summarized above.  Clinical condition: stable.        Edwin Dada [facility] Pager [phone]

## 2015-11-24 NOTE — ED Provider Notes (Signed)
[facility]-EMERGENCY DEPT Provider Note   CSN: [id number] Arrival date & time: 11/23/15  2155  By signing my name below, I, Jeremy [name], attest that this documentation has been prepared under the direction and in the presence of [name], MD. Electronically Signed: Phillis [name], ED Scribe. 11/24/15. 2:07 AM.  History   Chief Complaint Chief Complaint  Patient presents with  . Hematuria   The history is provided by a relative. No language interpreter was used.   HPI Comments (Level 5 Caveat due to dementia) Jeremy [name] is a 82 y.o. male with a hx of CAD, dementia, and HTN, brought in by EMS who presents to the Emergency Department complaining of hematuria onset 3 days ago. Pt is at home and visited by home health. Relative says that he received the foley catheter in October and has it changed monthyl. Due to the onset of hematuria, the nurse from urology came to the house to change the catheter when she noticed the shaft of his penis was split. Relative said that pt has continued to bleed heavily. She noticed some "gumminess" to the blood, but no specific clots. Pt is on Plavix and aspirin. Relative notes that nurse said pt had a fever earlier today, but not when relative checked. Pt is afebrile in the ED.   Past Medical History:  Diagnosis Date  . CAD (coronary artery disease)   . Dementia   . HTN (hypertension)   . Hyperlipidemia   . Hypotension   . Hypothyroidism    s/p Thyroidectomy, partial  . Sepsis (HCC)   . Syncope    4/11 (hospitalized) and 4/13  . Syncope   . UTI (lower urinary tract infection)     Patient Active Problem List   Diagnosis Date Noted  . Syncope 08/27/2015  . Hyperkalemia 08/26/2015  . Hypotension 08/25/2015  . Vasovagal near syncope 08/25/2015  . Nausea & vomiting 08/25/2015  . SIRS (systemic inflammatory response syndrome) (HCC) 06/08/2015  . Orthostatic hypotension 06/01/2015  . Confusion   . Urinary tract infectious disease   . UTI  (urinary tract infection) due to urinary indwelling catheter (HCC) 04/19/2015  . Pressure ulcer 04/19/2015  . Syncope and collapse 04/15/2015  . Syncopal episodes 04/15/2015  . AKI (acute kidney injury) (HCC) 04/14/2015  . Abnormality of gait 02/25/2015  . Palliative care encounter 12/10/2014  . Protein-calorie malnutrition, severe 12/06/2014  . Aspiration pneumonia of right lower lobe due to regurgitated food (HCC) 12/06/2014  . CKD (chronic kidney disease) stage 3, GFR 30-59 ml/min 12/06/2014  . Anemia of chronic renal failure, stage 3 (moderate) 12/06/2014  . Dyslipidemia 12/06/2014  . Coronary artery disease involving native coronary artery of native heart without angina pectoris 12/06/2014  . BPH (benign prostatic hyperplasia) 12/06/2014  . Chronic diastolic congestive heart failure (HCC) 12/06/2014  . Depression 12/06/2014  . HCAP (healthcare-associated pneumonia) 12/05/2014  . Dementia with behavioral disturbance 11/23/2014  . Acute encephalopathy 11/06/2014  . UTI (lower urinary tract infection) 11/06/2014  . Hypothyroidism 01/26/2014    Past Surgical History:  Procedure Laterality Date  . BACK SURGERY    . CARDIAC CATHETERIZATION  02/25/2002   Proximal L Circumflex 95% lesion, stented w/ 3x18-mm CYPHER stent avoiding the 2 L Marginal branch, jailing the 1st marginal, resulting in reduction of a 90-95% lesion to 0% residual  . CARDIOVASCULAR STRESS TEST  12/18/2006   EKG negative for ischemia, no significant ischemia demonstrated.  . CORONARY ANGIOPLASTY WITH STENT PLACEMENT    . RENAL DOPPLER  08/17/2005   Normal evaluation  . THYROIDECTOMY, PARTIAL    . TRANSTHORACIC ECHOCARDIOGRAM  03/13/2002   EF >55%, moderate LVH,        Home Medications    Prior to Admission medications   Medication Sig Start Date End Date Taking? Authorizing Provider  aspirin EC 81 MG tablet Take 81 mg by mouth every morning.    Yes Historical Provider, MD  clopidogrel (PLAVIX) 75 MG tablet  Take 75 mg by mouth every morning.    Yes Historical Provider, MD  CRANBERRY PO Take 1 tablet by mouth every evening.    Yes Historical Provider, MD  DOK 100 MG capsule Take 100 mg by mouth at bedtime. 10/08/15  Yes Historical Provider, MD  dorzolamide (TRUSOPT) 2 % ophthalmic solution Place 1 drop into the right eye 2 (two) times daily.  08/23/12  Yes Historical Provider, MD  finasteride (PROSCAR) 5 MG tablet Take 5 mg by mouth every evening.    Yes Historical Provider, MD  fludrocortisone (FLORINEF) 0.1 MG tablet Take 0.5 tablets (0.05 mg total) by mouth every morning. 08/29/15  Yes Rhonda G Barrett, PA-C  latanoprost (XALATAN) 0.005 % ophthalmic solution Place 1 drop into both eyes at bedtime.   Yes Historical Provider, MD  levothyroxine (SYNTHROID, LEVOTHROID) 75 MCG tablet Take 75 mcg by mouth daily before breakfast.  11/06/14  Yes Historical Provider, MD  meloxicam (MOBIC) 15 MG tablet Take 15 mg by mouth every morning.  02/17/15  Yes Historical Provider, MD  midodrine (PROAMATINE) 5 MG tablet Take 1 tablet (5 mg total) by mouth 3 (three) times daily with meals. 11/02/15  Yes Runell GessJonathan J Berry, MD  OVER THE COUNTER MEDICATION Take 1 tablet by mouth every Monday, Wednesday, and Friday. With lunch.  Drop 1 tablet in water daily and drink- Over the counter [employer]   Yes Historical Provider, MD  QUEtiapine (SEROQUEL) 25 MG tablet Take 1 tablet (25 mg total) by mouth at bedtime. For behaviors Patient taking differently: Take 25 mg by mouth at bedtime.  02/25/15  Yes Levert FeinsteinYijun Yan, MD  ranitidine (ZANTAC) 75 MG tablet Take 75 mg by mouth every morning. Reported on 06/08/2015   Yes Historical Provider, MD  simvastatin (ZOCOR) 40 MG tablet Take 40 mg by mouth daily at 6 PM.    Yes Historical Provider, MD  timolol (TIMOPTIC) 0.5 % ophthalmic solution Place 1 drop into both eyes 2 (two) times daily.  09/18/12  Yes Historical Provider, MD  ciprofloxacin (CIPRO) 500 MG tablet Take 1 tablet (500 mg total) by mouth 2  (two) times daily. Patient not taking: Reported on 11/23/2015 09/25/15   Earley FavorGail Schulz, NP  Vitamin D, Ergocalciferol, (DRISDOL) 50000 units CAPS capsule Take 50,000 Units by mouth 2 (two) times a week. Monday and Thursday in the evening    Historical Provider, MD    Family History Family History  Problem Relation Age of Onset  . Family history unknown: Yes    Social History Social History  Substance Use Topics  . Smoking status: Former Games developermoker  . Smokeless tobacco: Never Used     Comment: Quit 50+ years ago.  . Alcohol use No     Allergies   Tramadol   Review of Systems Review of Systems  Unable to perform ROS: Dementia   Physical Exam Updated Vital Signs BP 125/79   Pulse 85   Temp 98.3 F (36.8 C) (Oral)   Resp 14   Ht 5\' 10"  (1.778 m)   Wt 136  lb (61.7 kg)   SpO2 100%   BMI 19.51 kg/m   Physical Exam  Constitutional: Vital signs are normal. He appears well-developed and well-nourished.  Non-toxic appearance. He does not appear ill. No distress.  HENT:  Head: Normocephalic and atraumatic.  Nose: Nose normal.  Mouth/Throat: Oropharynx is clear and moist. No oropharyngeal exudate.  Eyes: Conjunctivae and EOM are normal. Pupils are equal, round, and reactive to light. No scleral icterus.  Neck: Normal range of motion. Neck supple. No tracheal deviation, no edema, no erythema and normal range of motion present. No thyroid mass and no thyromegaly present.  Cardiovascular: Normal rate, regular rhythm, S1 normal, S2 normal, normal heart sounds, intact distal pulses and normal pulses.  Exam reveals no gallop and no friction rub.   No murmur heard. Pulmonary/Chest: Effort normal and breath sounds normal. No respiratory distress. He has no wheezes. He has no rhonchi. He has no rales.  Abdominal: Soft. Normal appearance and bowel sounds are normal. He exhibits no distension, no ascites and no mass. There is no hepatosplenomegaly. There is tenderness in the suprapubic area.  There is no rebound, no guarding and no CVA tenderness.  Suprapubic tenderness to palpation  Genitourinary:  Genitourinary Comments: Hematuria noted to catheter bag; hypospadia penis  Musculoskeletal: Normal range of motion. He exhibits no edema or tenderness.  Lymphadenopathy:    He has no cervical adenopathy.  Neurological: He is alert. He has normal strength. No cranial nerve deficit or sensory deficit.  Skin: Skin is warm, dry and intact. No petechiae and no rash noted. He is not diaphoretic. No erythema. No pallor.  Nursing note and vitals reviewed.  ED Treatments / Results  DIAGNOSTIC STUDIES: Oxygen Saturation is 96% on RA, normal by my interpretation.    COORDINATION OF CARE: 2:07 AM-Discussed treatment plan which includes labs and flushing of catheter with relative at bedside and relative agreed to plan.    Labs (all labs ordered are listed, but only abnormal results are displayed) Labs Reviewed  CBC WITH DIFFERENTIAL/PLATELET - Abnormal; Notable for the following:       Result Value   WBC 11.7 (*)    RBC 3.38 (*)    Hemoglobin 9.6 (*)    HCT 29.1 (*)    Monocytes Absolute 1.4 (*)    All other components within normal limits  BASIC METABOLIC PANEL - Abnormal; Notable for the following:    Glucose, Bld 109 (*)    BUN 22 (*)    All other components within normal limits  URINE CULTURE  URINALYSIS, ROUTINE W REFLEX MICROSCOPIC (NOT [facility])  TYPE AND SCREEN    EKG  EKG Interpretation  Date/Time:  Tuesday November 23 2015 22:10:08 EST Ventricular Rate:  91 PR Interval:    QRS Duration: 117 QT Interval:  374 QTC Calculation: 461 R Axis:   -49 Text Interpretation:  Sinus rhythm Nonspecific IVCD with LAD Anteroseptal infarct, old No significant change since last tracing Confirmed by Juleen ChinaKOHUT  MD, STEPHEN [phone]([id number]) on 11/23/2015 10:27:22 PM       Radiology No results found.  Procedures Procedures (including critical care time)  Medications Ordered in  ED Medications - No data to display   Initial Impression / Assessment and Plan / ED Course  I have reviewed the triage vital signs and the nursing notes.  Pertinent labs & imaging results that were available during my care of the patient were reviewed by me and considered in my medical decision making (see chart for details).  Clinical Course     Patient presents to the ED for hematuria and mild abdominal pain.  Will check CBC to ensure he has not lost too much blood.  Foley catheter is being flushed to see if it clears.  UA is pending.  5:19 AM Hgb is 9.6, acutely dropped from 12.  He continues to have blood in his foley after flushing with 3L of IVF.  I spoke with Dr. Berneice Heinrich who recommends for hospitalist admission due to comorbidities.  He is coming to the ED to evaluate the patient and exchange the foley.  Final Clinical Impressions(s) / ED Diagnoses   Final diagnoses:  None    I personally performed the services described in this documentation, which was scribed in my presence. The recorded information has been reviewed and is accurate.     New Prescriptions New Prescriptions   No medications on file     [name], MD 11/24/15 [phone]

## 2015-11-25 DIAGNOSIS — Z66 Do not resuscitate: Secondary | ICD-10-CM | POA: Diagnosis present

## 2015-11-25 DIAGNOSIS — T39015A Adverse effect of aspirin, initial encounter: Secondary | ICD-10-CM | POA: Diagnosis present

## 2015-11-25 DIAGNOSIS — E890 Postprocedural hypothyroidism: Secondary | ICD-10-CM | POA: Diagnosis present

## 2015-11-25 DIAGNOSIS — Z955 Presence of coronary angioplasty implant and graft: Secondary | ICD-10-CM | POA: Diagnosis not present

## 2015-11-25 DIAGNOSIS — F0391 Unspecified dementia with behavioral disturbance: Secondary | ICD-10-CM | POA: Diagnosis present

## 2015-11-25 DIAGNOSIS — R310 Gross hematuria: Secondary | ICD-10-CM | POA: Diagnosis present

## 2015-11-25 DIAGNOSIS — D631 Anemia in chronic kidney disease: Secondary | ICD-10-CM | POA: Diagnosis present

## 2015-11-25 DIAGNOSIS — S3121XA Laceration without foreign body of penis, initial encounter: Secondary | ICD-10-CM | POA: Diagnosis present

## 2015-11-25 DIAGNOSIS — N183 Chronic kidney disease, stage 3 (moderate): Secondary | ICD-10-CM

## 2015-11-25 DIAGNOSIS — Z681 Body mass index (BMI) 19 or less, adult: Secondary | ICD-10-CM | POA: Diagnosis not present

## 2015-11-25 DIAGNOSIS — I5032 Chronic diastolic (congestive) heart failure: Secondary | ICD-10-CM | POA: Diagnosis present

## 2015-11-25 DIAGNOSIS — D62 Acute posthemorrhagic anemia: Secondary | ICD-10-CM | POA: Diagnosis present

## 2015-11-25 DIAGNOSIS — Z87891 Personal history of nicotine dependence: Secondary | ICD-10-CM | POA: Diagnosis not present

## 2015-11-25 DIAGNOSIS — I2510 Atherosclerotic heart disease of native coronary artery without angina pectoris: Secondary | ICD-10-CM

## 2015-11-25 DIAGNOSIS — R338 Other retention of urine: Secondary | ICD-10-CM | POA: Diagnosis present

## 2015-11-25 DIAGNOSIS — I130 Hypertensive heart and chronic kidney disease with heart failure and stage 1 through stage 4 chronic kidney disease, or unspecified chronic kidney disease: Secondary | ICD-10-CM | POA: Diagnosis present

## 2015-11-25 DIAGNOSIS — N401 Enlarged prostate with lower urinary tract symptoms: Secondary | ICD-10-CM | POA: Diagnosis present

## 2015-11-25 DIAGNOSIS — D6832 Hemorrhagic disorder due to extrinsic circulating anticoagulants: Secondary | ICD-10-CM | POA: Diagnosis present

## 2015-11-25 DIAGNOSIS — E43 Unspecified severe protein-calorie malnutrition: Secondary | ICD-10-CM

## 2015-11-25 DIAGNOSIS — T45525A Adverse effect of antithrombotic drugs, initial encounter: Secondary | ICD-10-CM | POA: Diagnosis present

## 2015-11-25 DIAGNOSIS — Y846 Urinary catheterization as the cause of abnormal reaction of the patient, or of later complication, without mention of misadventure at the time of the procedure: Secondary | ICD-10-CM | POA: Diagnosis present

## 2015-11-25 DIAGNOSIS — E785 Hyperlipidemia, unspecified: Secondary | ICD-10-CM | POA: Diagnosis present

## 2015-11-25 DIAGNOSIS — I9589 Other hypotension: Secondary | ICD-10-CM | POA: Diagnosis present

## 2015-11-25 DIAGNOSIS — E039 Hypothyroidism, unspecified: Secondary | ICD-10-CM

## 2015-11-25 DIAGNOSIS — Z9079 Acquired absence of other genital organ(s): Secondary | ICD-10-CM | POA: Diagnosis not present

## 2015-11-25 DIAGNOSIS — R296 Repeated falls: Secondary | ICD-10-CM | POA: Diagnosis present

## 2015-11-25 DIAGNOSIS — R319 Hematuria, unspecified: Secondary | ICD-10-CM | POA: Diagnosis present

## 2015-11-25 LAB — CBC
HEMATOCRIT: 23.1 % — AB (ref 39.0–52.0)
Hemoglobin: 7.6 g/dL — ABNORMAL LOW (ref 13.0–17.0)
MCH: 28.4 pg (ref 26.0–34.0)
MCHC: 32.9 g/dL (ref 30.0–36.0)
MCV: 86.2 fL (ref 78.0–100.0)
Platelets: 197 10*3/uL (ref 150–400)
RBC: 2.68 MIL/uL — AB (ref 4.22–5.81)
RDW: 15.1 % (ref 11.5–15.5)
WBC: 11.1 10*3/uL — AB (ref 4.0–10.5)

## 2015-11-25 LAB — BASIC METABOLIC PANEL
ANION GAP: 6 (ref 5–15)
BUN: 26 mg/dL — ABNORMAL HIGH (ref 6–20)
CO2: 24 mmol/L (ref 22–32)
Calcium: 8.8 mg/dL — ABNORMAL LOW (ref 8.9–10.3)
Chloride: 109 mmol/L (ref 101–111)
Creatinine, Ser: 1.13 mg/dL (ref 0.61–1.24)
GFR calc Af Amer: >60 mL/min (ref >60)
GFR, EST NON AFRICAN AMERICAN: 56 mL/min — AB (ref >60)
GLUCOSE: 98 mg/dL (ref 65–99)
POTASSIUM: 4.4 mmol/L (ref 3.5–5.1)
Sodium: 139 mmol/L (ref 135–145)

## 2015-11-25 LAB — PREPARE RBC (CROSSMATCH)

## 2015-11-25 MED ORDER — SODIUM CHLORIDE 0.9 % IV SOLN
Freq: Once | INTRAVENOUS | Status: AC
  Administered 2015-11-25: 12:00:00 via INTRAVENOUS

## 2015-11-25 MED ORDER — FUROSEMIDE 10 MG/ML IJ SOLN
20.0000 mg | Freq: Once | INTRAMUSCULAR | Status: AC
  Administered 2015-11-25: 20 mg via INTRAVENOUS
  Filled 2015-11-25: qty 2

## 2015-11-25 NOTE — Progress Notes (Addendum)
[facility] PROGRESS NOTE  Jeremy [name] T [name] [name] DOB: Apr 16, 1925 DOA: 11/23/2015 PCP: Jeremy [name]  Interim summary and HPI 82 y.o. male with a past medical history significant for mild dementia, CKD III, CAD s/p PCI in 2004 on Plavix and aspirin, chronic diastolic CHF, chronic hypotension on fluorinef and midodrine, and BPH with indwelling foley who presents with hematuria for 3 days.  The patient lives with his wife, walks with a walker, and has 24-hour companion, and was in his usual state of health until "Sunday. This week and the family went to [place] for a family get together and the patient was fine.  Sunday night when they got back, they noticed he had new bright red blood in his foley bag.  This persisted Monday, so the family called the urologist who sent urology nurse on Tuesday, who evaluated the foley and found only a small laceration of the patient's penis.  By this evening, the bleeding continued and the patient seemed weak, so family brought him to the ER.  Assessment/Plan: 1. Hematuria:  -appreciate caresand rec's from urology group -patient stil with hematuria, but more clear -will continue supportive care and continuous bladder irrigation  2. Acute blood loss anemia:  -Trend Hgb  -will Transfuse 1 unit of PRBC -continue Holding aspirin and Plavix  3. Coronary disease:  Last stent >10 years ago. -Continue statin -Aspirin and Plavix has been discontinued  4. Chronic hypotension:  -Continue Fluorinef and midodrine  5. CKD stage 3 -at baseline -will monitor trend intermittently  6.protein calorie malnutrition, severe -will follow rec's from nutritional service -continue feeding supplements   7.Hypothyroidism:  -Continue levothyroxine  Code Status: DNR Family Communication: daughter at bedside  Disposition Plan: home when medically stable; will follow Hgb trend and given dropped of 3 grams will transfuse 1 unit of PRBC. Will follow  urology rec's   Consultants:  Urology   Procedures:  Continuous Bladder Irrigation   Antibiotics:  None   HPI/Subjective: Still having hematuria (even much clear today); Hgb has dropped to 7.6 range. No fever, no CP, no SOB.  Objective: Vitals:   11/25/15 1437 11/25/15 1515  BP: 120/64 120/73  Pulse: 77 75  Resp: 16 16  Temp: 97.9 F (36.6 C) 97.8 F (36.6 C)    Intake/Output Summary (Last 24 hours) at 11/25/15 1605 Last data filed at 11/25/15 1551  Gross per 24 hour  Intake            32530 ml  Output            39" 950 ml  Net            -7420 ml   Filed Weights   11/23/15 2239 11/24/15 0655  Weight: 61.7 kg (136 lb) 60.7 kg (133 lb 13.1 oz)    Exam:   General:  Afebrile, no CP, no SOB, still with hematuria (somewhat better).  Cardiovascular: S1 and S2, no rubs, no gallops  Respiratory: CTA  Abdomen: soft, NT, positive BS  Musculoskeletal: no edema or cyanosis   Data Reviewed: Basic Metabolic Panel:  Recent Labs Lab 11/24/15 0409 11/25/15 0455  NA 137 139  K 3.8 4.4  CL 107 109  CO2 23 24  GLUCOSE 109* 98  BUN 22* 26*  CREATININE 1.01 1.13  CALCIUM 9.1 8.8*   CBC:  Recent Labs Lab 11/24/15 0409 11/24/15 1658 11/25/15 0455  WBC 11.7* 8.8 11.1*  NEUTROABS 7.7  --   --   HGB 9.6* 8.0* 7.6*  HCT  29.1* 24.1* 23.1*  MCV 86.1 87.3 86.2  PLT 222 178 197   CBG: No results for input(s): GLUCAP in the last 168 hours.  Recent Results (from the past 240 hour(s))  MRSA PCR Screening     Status: Abnormal   Collection Time: 11/24/15  8:20 AM  Result Value Ref Range Status   MRSA by PCR POSITIVE (A) NEGATIVE Final    Comment:        The GeneXpert MRSA Assay (FDA approved for NASAL specimens only), is one component of a comprehensive MRSA colonization surveillance program. It is not intended to diagnose MRSA infection nor to guide or monitor treatment for MRSA infections. RESULT CALLED TO, READ BACK BY AND VERIFIED WITH: Jeremy [name]      Studies: No results found.  Scheduled Meds: . dorzolamide  1 drop Right Eye BID  . famotidine  20 mg Oral Daily  . finasteride  5 mg Oral QPM  . fludrocortisone  0.05 mg Oral q morning - 10a  . furosemide  20 mg Intravenous Once  . latanoprost  1 drop Both Eyes QHS  . levothyroxine  75 mcg Oral QAC breakfast  . midodrine  5 mg Oral TID WC  . QUEtiapine  25 mg Oral QHS  . simvastatin  40 mg Oral q1800  . timolol  1 drop Both Eyes BID   Continuous Infusions:  Principal Problem:   Hematuria Active Problems:   Hypothyroidism, acquired   Dementia with behavioral disturbance   Protein-calorie malnutrition, severe   CKD (chronic kidney disease) stage 3, GFR 30-59 ml/min   Anemia of chronic renal failure, stage 3 (moderate)   Coronary artery disease involving native coronary artery of native heart without angina pectoris   Acute blood loss anemia    Time spent: 25 minutes    Jeremy [name]  [facility] Pager [phone]. If 7PM-7AM, please contact night-coverage at [email or web address], password [email or web address] 11/25/2015, 4:05 PM  LOS: 0 days

## 2015-11-25 NOTE — Progress Notes (Signed)
Spoke/read nurse notes Cool aide color this am Hb down but stable cv Blood thinners stopped Spoke to nurse re CBI  Continue with care and follow labs

## 2015-11-25 NOTE — Progress Notes (Signed)
RN has had to increase rate of CBI multiple times tonight already. Just irrigated for 30+ minutes due to multiple clots. CBI is running fairly fast and CBI color is a dark pink/red but clear.  Rockne CoonsCorcoran, Leroy Pettway C, RN 11/25/15 12:14 AM

## 2015-11-26 DIAGNOSIS — F0391 Unspecified dementia with behavioral disturbance: Secondary | ICD-10-CM

## 2015-11-26 LAB — CBC
HEMATOCRIT: 30.7 % — AB (ref 39.0–52.0)
HEMOGLOBIN: 10.4 g/dL — AB (ref 13.0–17.0)
MCH: 29.1 pg (ref 26.0–34.0)
MCHC: 33.9 g/dL (ref 30.0–36.0)
MCV: 85.8 fL (ref 78.0–100.0)
Platelets: 179 10*3/uL (ref 150–400)
RBC: 3.58 MIL/uL — ABNORMAL LOW (ref 4.22–5.81)
RDW: 15.5 % (ref 11.5–15.5)
WBC: 12.2 10*3/uL — ABNORMAL HIGH (ref 4.0–10.5)

## 2015-11-26 LAB — TYPE AND SCREEN
ABO/RH(D): O POS
ANTIBODY SCREEN: NEGATIVE
UNIT DIVISION: 0

## 2015-11-26 NOTE — Progress Notes (Signed)
[facility] PROGRESS NOTE  Jeremy [name] [name]:[id number] DOB: [birth year] DOA: 11/23/2015 PCP: Arnette FeltsMoore, [name]  Interim summary and HPI 82 y.o. male with a past medical history significant for mild dementia, CKD III, CAD s/p PCI in 2004 on Plavix and aspirin, chronic diastolic CHF, chronic hypotension on fluorinef and midodrine, and BPH with indwelling foley who presents with hematuria for 3 days.  The patient lives with his wife, walks with a walker, and has 24-hour companion, and was in his usual state of health until "Sunday. This week and the family went to [place] for a family get together and the patient was fine.  Sunday night when they got back, they noticed he had new bright red blood in his foley bag.  This persisted Monday, so the family called the urologist who sent urology nurse on Tuesday, who evaluated the foley and found only a small laceration of the patient's penis.  By this evening, the bleeding continued and the patient seemed weak, so family brought him to the ER.  Assessment/Plan: 1. Hematuria:  -appreciate caresand rec's from urology group -patient stil with hematuria, but more clear; unfortunately still requiring fast pace CBI. -will continue supportive care and continuous bladder irrigation  2. Acute blood loss anemia:  -Trend Hgb  -status post 1 unit of PRBC transfusion -continue Holding aspirin and Plavix  3. Coronary disease:  Last stent >10 years ago. -Continue statin -Aspirin and Plavix has been discontinued due to ongoing hematuria  4. Chronic hypotension:  -Continue Fluorinef and midodrine  5. CKD stage 3 -stable and at baseline -will monitor trend intermittently  6.protein calorie malnutrition, severe -will follow rec's from nutritional service -continue feeding supplements   7.Hypothyroidism:  -Continue levothyroxine  Code Status: DNR Family Communication: daughter at bedside  Disposition Plan: home when medically stable; will  follow Hgb trend and transfuse further as needed. Will follow urology rec's   Consultants:  Urology   Procedures:  Continuous Bladder Irrigation   Antibiotics:  None   HPI/Subjective: Still having hematuria; Hgb has stabilized after 1 unit of PRBC transfused. No fever, no CP, no SOB.  Objective: Vitals:   11/26/15 1431 11/26/15 2258  BP: 114/65 128/62  Pulse: 83 85  Resp: 16 16  Temp: 98.2 F (36.8 C) 97.8 F (36.6 C)    Intake/Output Summary (Last 24 hours) at 11/26/15 2305 Last data filed at 11/26/15 1845  Gross per 24 hour  Intake             5920 ml  Output            11" 350 ml  Net            -5430 ml   Filed Weights   11/23/15 2239 11/24/15 0655  Weight: 61.7 kg (136 lb) 60.7 kg (133 lb 13.1 oz)    Exam:   General:  Afebrile, no CP, no SOB, still with hematuria (somewhat better), but very depended to CBI.  Cardiovascular: S1 and S2, no rubs, no gallops  Respiratory: CTA  Abdomen: soft, NT, positive BS  Musculoskeletal: no edema or cyanosis   Data Reviewed: Basic Metabolic Panel:  Recent Labs Lab 11/24/15 0409 11/25/15 0455  NA 137 139  K 3.8 4.4  CL 107 109  CO2 23 24  GLUCOSE 109* 98  BUN 22* 26*  CREATININE 1.01 1.13  CALCIUM 9.1 8.8*   CBC:  Recent Labs Lab 11/24/15 0409 11/24/15 1658 11/25/15 0455 11/26/15 0422  WBC 11.7* 8.8 11.1* 12.2*  NEUTROABS  7.7  --   --   --   HGB 9.6* 8.0* 7.6* 10.4*  HCT 29.1* 24.1* 23.1* 30.7*  MCV 86.1 87.3 86.2 85.8  PLT 222 178 197 179   CBG: No results for input(s): GLUCAP in the last 168 hours.  Recent Results (from the past 240 hour(s))  MRSA PCR Screening     Status: Abnormal   Collection Time: 11/24/15  8:20 AM  Result Value Ref Range Status   MRSA by PCR POSITIVE (A) NEGATIVE Final    Comment:        The GeneXpert MRSA Assay (FDA approved for NASAL specimens only), is one component of a comprehensive MRSA colonization surveillance program. It is not intended to diagnose  MRSA infection nor to guide or monitor treatment for MRSA infections. RESULT CALLED TO, READ BACK BY AND VERIFIED WITH: GURLEY,K @ 1002 ON 112217 BY POTEAT,S      Studies: No results found.  Scheduled Meds: . dorzolamide  1 drop Right Eye BID  . famotidine  20 mg Oral Daily  . finasteride  5 mg Oral QPM  . fludrocortisone  0.05 mg Oral q morning - 10a  . latanoprost  1 drop Both Eyes QHS  . levothyroxine  75 mcg Oral QAC breakfast  . midodrine  5 mg Oral TID WC  . QUEtiapine  25 mg Oral QHS  . simvastatin  40 mg Oral q1800  . timolol  1 drop Both Eyes BID   Continuous Infusions:  Principal Problem:   Hematuria Active Problems:   Hypothyroidism, acquired   Dementia with behavioral disturbance   Protein-calorie malnutrition, severe   CKD (chronic kidney disease) stage 3, GFR 30-59 ml/min   Anemia of chronic renal failure, stage 3 (moderate)   Coronary artery disease involving native coronary artery of native heart without angina pectoris   Acute blood loss anemia    Time spent: 25 minutes    Jeremy [name], Jeremy [name]  [facility] Pager [phone]. If 7PM-7AM, please contact night-coverage at [email or web address], password [email or web address] 11/26/2015, 11:05 PM  LOS: 1 day

## 2015-11-26 NOTE — Progress Notes (Signed)
Looks good today No CBI issues thru the night Pink to clear this am Hb OK and improved Continue CBI

## 2015-11-27 NOTE — Progress Notes (Signed)
[facility] PROGRESS NOTE  Pershing CoxJames T [name] [name]:[id number] DOB: 25-Dec-1925 DOA: 11/23/2015 PCP: Arnette FeltsMoore, Jeremy  Interim summary and HPI 82 y.o. male with a past medical history significant for mild dementia, CKD III, CAD s/p PCI in 2004 on Plavix and aspirin, chronic diastolic CHF, chronic hypotension on fluorinef and midodrine, and BPH with indwelling foley who presents with hematuria for 3 days.  The patient lives with his wife, walks with a walker, and has 24-hour companion, and was in his usual state of health until "Sunday. This week and the family went to [place] for a family get together and the patient was fine.  Sunday night when they got back, they noticed he had new bright red blood in his foley bag.  This persisted Monday, so the family called the urologist who sent urology nurse on Tuesday, who evaluated the foley and found only a small laceration of the patient's penis.  By this evening, the bleeding continued and the patient seemed weak, so family brought him to the ER.  Assessment/Plan: 1. Hematuria:  -appreciate caresand rec's from urology group -patient stil with some hematuria, clearing and no needing CBI now and no frank hematuria or clots appreciated. Will continue to monitor and if remains stable, most likely can be discharge tomorrow -will follow CBC in am -per urology will keep foley catheter in place  2. Acute blood loss anemia:  -Trend Hgb  -status post 1 unit of PRBC transfusion -continue Holding aspirin and Plavix  3. Coronary disease:  Last stent >10 years ago. -Continue statin -Aspirin and Plavix has been discontinued due to ongoing hematuria  4. Chronic hypotension:  -Continue Fluorinef and midodrine  5. CKD stage 3 -stable and at baseline -will monitor trend intermittently  6.protein calorie malnutrition, severe -will follow rec's from nutritional service -continue feeding supplements   7.Hypothyroidism:  -Continue  levothyroxine  Code Status: DNR Family Communication: daughter at bedside  Disposition Plan: home when medically stable; will follow Hgb trend and transfuse further as needed. Will follow urology rec's   Consultants:  Urology   Procedures:  Continuous Bladder Irrigation   Antibiotics:  None   HPI/Subjective: Still having hematuria; Hgb stable and no acute distress or complaints. Off CBI and so far well tolerated. Per Urology leave catheter in place.   Objective: Vitals:   11/27/15 0452 11/27/15 1533  BP: (!) 142/67 (!) 101/55  Pulse: (!) 111 96  Resp: 16 13  Temp: 99.3 F (37.4 C) 98.6 F (37 C)    Intake/Output Summary (Last 24 hours) at 11/27/15 1541 Last data filed at 11/27/15 0737  Gross per 24 hour  Intake             1740 ml  Output             18" 75 ml  Net             -135 ml   Filed Weights   11/23/15 2239 11/24/15 0655  Weight: 61.7 kg (136 lb) 60.7 kg (133 lb 13.1 oz)    Exam:   General:  Afebrile, no CP, no SOB, still with some hematuria (much better), currently off CBI. Urine dark tea color.  Cardiovascular: S1 and S2, no rubs, no gallops  Respiratory: CTA  Abdomen: soft, NT, positive BS  Musculoskeletal: no edema or cyanosis   Data Reviewed: Basic Metabolic Panel:  Recent Labs Lab 11/24/15 0409 11/25/15 0455  NA 137 139  K 3.8 4.4  CL 107 109  CO2 23 24  GLUCOSE 109* 98  BUN 22* 26*  CREATININE 1.01 1.13  CALCIUM 9.1 8.8*   CBC:  Recent Labs Lab 11/24/15 0409 11/24/15 1658 11/25/15 0455 11/26/15 0422  WBC 11.7* 8.8 11.1* 12.2*  NEUTROABS 7.7  --   --   --   HGB 9.6* 8.0* 7.6* 10.4*  HCT 29.1* 24.1* 23.1* 30.7*  MCV 86.1 87.3 86.2 85.8  PLT 222 178 197 179   CBG: No results for input(s): GLUCAP in the last 168 hours.  Recent Results (from the past 240 hour(s))  MRSA PCR Screening     Status: Abnormal   Collection Time: 11/24/15  8:20 AM  Result Value Ref Range Status   MRSA by PCR POSITIVE (A) NEGATIVE Final     Comment:        The GeneXpert MRSA Assay (FDA approved for NASAL specimens only), is one component of a comprehensive MRSA colonization surveillance program. It is not intended to diagnose MRSA infection nor to guide or monitor treatment for MRSA infections. RESULT CALLED TO, READ BACK BY AND VERIFIED WITH: GURLEY,K @ 1002 ON 112217 BY POTEAT,S      Studies: No results found.  Scheduled Meds: . dorzolamide  1 drop Right Eye BID  . famotidine  20 mg Oral Daily  . finasteride  5 mg Oral QPM  . fludrocortisone  0.05 mg Oral q morning - 10a  . latanoprost  1 drop Both Eyes QHS  . levothyroxine  75 mcg Oral QAC breakfast  . midodrine  5 mg Oral TID WC  . QUEtiapine  25 mg Oral QHS  . simvastatin  40 mg Oral q1800  . timolol  1 drop Both Eyes BID   Continuous Infusions:  Principal Problem:   Hematuria Active Problems:   Hypothyroidism, acquired   Dementia with behavioral disturbance   Protein-calorie malnutrition, severe   CKD (chronic kidney disease) stage 3, GFR 30-59 ml/min   Anemia of chronic renal failure, stage 3 (moderate)   Coronary artery disease involving native coronary artery of native heart without angina pectoris   Acute blood loss anemia    Time spent: 25 minutes    Vassie LollMadera, Khoi Hamberger  [facility] Pager [phone]. If 7PM-7AM, please contact night-coverage at [email or web address], password [email or web address] 11/27/2015, 3:41 PM  LOS: 2 days

## 2015-11-27 NOTE — Progress Notes (Signed)
  Subjective: Patient reports no problems with catheter.  Very little irrigation necessary at this point.  Objective: Vital signs in last 24 hours: Temp:  [97.8 F (36.6 C)-99.3 F (37.4 C)] 99.3 F (37.4 C) (11/25 0452) Pulse Rate:  [83-111] 111 (11/25 0452) Resp:  [16] 16 (11/25 0452) BP: (114-142)/(62-67) 142/67 (11/25 0452) SpO2:  [99 %-100 %] 99 % (11/25 0452)  Intake/Output from previous day: 11/24 0701 - 11/25 0700 In: 4920 [P.O.:720] Out: 6550 [Urine:6550] Intake/Output this shift: Total I/O In: 300 [Other:300] Out: -   Physical Exam:  Constitutional: Vital signs reviewed. WD WN in NAD   Eyes: PERRL, No scleral icterus.   Pulmonary/Chest: Normal effort   Lab Results:  Recent Labs  11/24/15 1658 11/25/15 0455 11/26/15 0422  HGB 8.0* 7.6* 10.4*  HCT 24.1* 23.1* 30.7*   BMET  Recent Labs  11/25/15 0455  NA 139  K 4.4  CL 109  CO2 24  GLUCOSE 98  BUN 26*  CREATININE 1.13  CALCIUM 8.8*   No results for input(s): LABPT, INR in the last 72 hours. No results for input(s): LABURIN in the last 72 hours. Results for orders placed or performed during the hospital encounter of 11/23/15  MRSA PCR Screening     Status: Abnormal   Collection Time: 11/24/15  8:20 AM  Result Value Ref Range Status   MRSA by PCR POSITIVE (A) NEGATIVE Final    Comment:        The GeneXpert MRSA Assay (FDA approved for NASAL specimens only), is one component of a comprehensive MRSA colonization surveillance program. It is not intended to diagnose MRSA infection nor to guide or monitor treatment for MRSA infections. RESULT CALLED TO, READ BACK BY AND VERIFIED WITH: GURLEY,K @ 1002 ON 112217 BY POTEAT,S     Studies/Results: No results found.  Assessment/Plan:   Gross hematuria, improving with the patient off of anticoagulants and minimal irrigation.  Urine at this point, dark/T colored without clots.    We will discontinue CBI.  Leave catheter.  We will continue  follow-up.   LOS: 2 days   Marcine MatarDAHLSTEDT, Charlee Whitebread M 11/27/2015, 10:00 AM

## 2015-11-28 LAB — BASIC METABOLIC PANEL
Anion gap: 7 (ref 5–15)
BUN: 27 mg/dL — AB (ref 6–20)
CALCIUM: 8.7 mg/dL — AB (ref 8.9–10.3)
CO2: 24 mmol/L (ref 22–32)
CREATININE: 1.1 mg/dL (ref 0.61–1.24)
Chloride: 106 mmol/L (ref 101–111)
GFR calc Af Amer: >60 mL/min (ref >60)
GFR, EST NON AFRICAN AMERICAN: 58 mL/min — AB (ref >60)
GLUCOSE: 85 mg/dL (ref 65–99)
Potassium: 3.7 mmol/L (ref 3.5–5.1)
Sodium: 137 mmol/L (ref 135–145)

## 2015-11-28 LAB — CBC
HEMATOCRIT: 25.8 % — AB (ref 39.0–52.0)
Hemoglobin: 8.6 g/dL — ABNORMAL LOW (ref 13.0–17.0)
MCH: 29.1 pg (ref 26.0–34.0)
MCHC: 33.3 g/dL (ref 30.0–36.0)
MCV: 87.2 fL (ref 78.0–100.0)
PLATELETS: 223 10*3/uL (ref 150–400)
RBC: 2.96 MIL/uL — ABNORMAL LOW (ref 4.22–5.81)
RDW: 15.2 % (ref 11.5–15.5)
WBC: 12 10*3/uL — AB (ref 4.0–10.5)

## 2015-11-28 MED ORDER — POLYSACCHARIDE IRON COMPLEX 150 MG PO CAPS: 150.0000 mg | ORAL_CAPSULE | Freq: Every day | ORAL | 1 refills | Status: DC

## 2015-11-28 NOTE — Final Consult Note (Signed)
PT fine to go from GU standpoint.  Instructed to followup in 2-3 weeks w/ Dr Mena GoesEskridge

## 2015-11-28 NOTE — Discharge Summary (Signed)
Physician Discharge Summary  Jeremy [name] DOB: [birth year] DOA: 11/23/2015  PCP: Arnette FeltsMoore, [name]  Admit date: 11/23/2015 Discharge date: 11/28/2015  Time spent: 35 minutes  Recommendations for Outpatient Follow-up:  1. CBC to follow Hgb trend 2. BMET to follow renal function   Discharge Diagnoses:  Principal Problem:   Hematuria Active Problems:   Hypothyroidism, acquired   Dementia with behavioral disturbance   Protein-calorie malnutrition, severe   CKD (chronic kidney disease) stage 3, GFR 30-59 ml/min   Anemia of chronic renal failure, stage 3 (moderate)   Coronary artery disease involving native coronary artery of native heart without angina pectoris   Acute blood loss anemia   Discharge Condition: stable and improved. Discharge home with instructions to follow up with PCP in 1 week and with urologist in 2-3 weeks  Diet recommendation: heart healthy diet   Filed Weights   11/23/15 2239 11/24/15 0655  Weight: 61.7 kg (136 lb) 60.7 kg (133 lb 13.1 oz)    History of present illness:  82 y.o.malewith a past medical history significant for mild dementia, CKD III, CAD s/p PCI in 2004 on Plavix and aspirin, chronic diastolic CHF, chronic hypotension on fluorinef and midodrine, and BPH with indwelling foleywho presents with hematuria for 3 days.  The patient lives with his wife, walks with a walker, and has 24-hour companion, and was in his usual state of health until Sunday. [place] [facility] [place] [facility] [place] and the patient was fine. Sunday night when they got back, they noticed he had new bright red blood in his foley bag. This persisted Monday, so the family called the urologist who sent urology nurse on Tuesday, who evaluated the foley and found only a small laceration of the patient's penis. By this evening, the bleeding continued and the patient seemed weak, so family brought him to the ER.  Hospital Course:  1.  Hematuria: -appreciate caresand rec's from urology group -patient finally with clear urine and no frank hematuria on exam. -no needing CBI now and no frank hematuria or clots appreciated. -will recommend follow up of CBC in 1 week (during follow up visit) -follow up with primary urologist in 2 weeks  2. Acute blood loss anemia: -due to acute hematuria -status post 1 unit of PRBC transfusion -aspirin, Mobic and Plavix discontinued at discharge -discharge on niferex daily  3. Coronary disease: Last stent >10 years ago. -Continue statin -Aspirin and Plavix has been discontinued due to ongoing hematuria  4. Chronic hypotension: -Continue Fluorinef and midodrine -BP stable  5. CKD stage 3 -stable and at baseline -will recommend BMET at follow up visit   6.protein calorie malnutrition, severe -will follow rec's from nutritional service -continue feeding supplements and encourage good PO and fluids intake  7.Hypothyroidism: -Continue levothyroxine  Procedures:  CBI  Consultations:  Urology   Discharge Exam: Vitals:   11/27/15 2217 11/28/15 0548  BP: 109/67 (!) 106/52  Pulse: 86 85  Resp: 16 16  Temp: 97.8 F (36.6 C) 98.6 F (37 C)     General:  Afebrile, no CP, no SOB, currently with clear urine and no frank hematuria at discharge.  Cardiovascular: S1 and S2, no rubs, no gallops  Respiratory: CTA  Abdomen: soft, NT, positive BS  Musculoskeletal: no edema or cyanosis   Discharge Instructions   Discharge Instructions    Discharge instructions    Complete by:  As directed    Keep yourself well hydrated Take medications as prescribed  Keep yourself well hydrated (at least 65 Oz water daily) Follow up with Dr. Holland Commons in 2 weeks Please take iron pill as prescribed; if unable to acquired, use ferrous sulfate 324mg  (OTC, but needs to take it three times a day)     Current Discharge Medication List    START taking these medications    Details  iron polysaccharides (NIFEREX) 150 MG capsule Take 1 capsule (150 mg total) by mouth daily. Qty: 30 capsule, Refills: 1      CONTINUE these medications which have NOT CHANGED   Details  CRANBERRY PO Take 1 tablet by mouth every evening.     DOK 100 MG capsule Take 100 mg by mouth at bedtime. Refills: 5    dorzolamide (TRUSOPT) 2 % ophthalmic solution Place 1 drop into the right eye 2 (two) times daily.     finasteride (PROSCAR) 5 MG tablet Take 5 mg by mouth every evening.     fludrocortisone (FLORINEF) 0.1 MG tablet Take 0.5 tablets (0.05 mg total) by mouth every morning. Qty: 30 tablet, Refills: 1    latanoprost (XALATAN) 0.005 % ophthalmic solution Place 1 drop into both eyes at bedtime.    levothyroxine (SYNTHROID, LEVOTHROID) 75 MCG tablet Take 75 mcg by mouth daily before breakfast.  Refills: 0    midodrine (PROAMATINE) 5 MG tablet Take 1 tablet (5 mg total) by mouth 3 (three) times daily with meals. Qty: 270 tablet, Refills: 3    OVER THE COUNTER MEDICATION Take 1 tablet by mouth every Monday, Wednesday, and Friday. With lunch.  Drop 1 tablet in water daily and drink- Over the counter [employer]    QUEtiapine (SEROQUEL) 25 MG tablet Take 1 tablet (25 mg total) by mouth at bedtime. For behaviors Qty: 90 tablet, Refills: 3    ranitidine (ZANTAC) 75 MG tablet Take 75 mg by mouth every morning. Reported on 06/08/2015    simvastatin (ZOCOR) 40 MG tablet Take 40 mg by mouth daily at 6 PM.     timolol (TIMOPTIC) 0.5 % ophthalmic solution Place 1 drop into both eyes 2 (two) times daily.     Vitamin D, Ergocalciferol, (DRISDOL) 50000 units CAPS capsule Take 50,000 Units by mouth 2 (two) times a week. Monday and Thursday in the evening      STOP taking these medications     aspirin EC 81 MG tablet      meloxicam (MOBIC) 15 MG tablet        Allergies  Allergen Reactions  . Tramadol Nausea And Vomiting   Follow-up Information    ESKRIDGE, MATTHEW, MD Follow  up.   Specialty:  Urology Why:  call for followup for 2-3 weeks Contact information: [address] [place] [address] [phone]        [name]. Schedule an appointment as soon as possible for a visit in 1 week(s).   Specialty:  General Practice Contact information: [address] [place] [address] [phone]           The results of significant diagnostics from this hospitalization (including imaging, microbiology, ancillary and laboratory) are listed below for reference.    Significant Diagnostic Studies: No results found.  Microbiology: Recent Results (from the past 240 hour(s))  MRSA PCR Screening     Status: Abnormal   Collection Time: 11/24/15  8:20 AM  Result Value Ref Range Status   MRSA by PCR POSITIVE (A) NEGATIVE Final    Comment:  The GeneXpert MRSA Assay (FDA approved for NASAL specimens only), is one component of a comprehensive MRSA colonization surveillance program. It is not intended to diagnose MRSA infection nor to guide or monitor treatment for MRSA infections. RESULT CALLED TO, READ BACK BY AND VERIFIED WITH: GURLEY,K @ 1002 ON [id number] BY POTEAT,S      Labs: Basic Metabolic Panel:  Recent Labs Lab 11/24/15 0409 11/25/15 0455 11/28/15 0515  NA 137 139 137  K 3.8 4.4 3.7  CL 107 109 106  CO2 23 24 24   GLUCOSE 109* 98 85  BUN 22* 26* 27*  CREATININE 1.01 1.13 1.10  CALCIUM 9.1 8.8* 8.7*   CBC:  Recent Labs Lab 11/24/15 0409 11/24/15 1658 11/25/15 0455 11/26/15 0422 11/28/15 0515  WBC 11.7* 8.8 11.1* 12.2* 12.0*  NEUTROABS 7.7  --   --   --   --   HGB 9.6* 8.0* 7.6* 10.4* 8.6*  HCT 29.1* 24.1* 23.1* 30.7* 25.8*  MCV 86.1 87.3 86.2 85.8 87.2  PLT 222 178 197 179 223    Signed:  Vassie LollMadera, Elvis Laufer MD.  [facility] 11/28/2015, 2:27 PM

## 2015-11-28 NOTE — Progress Notes (Signed)
Patient discharged home with daughter, discharge instructions given and explained to patient/daughter and they verbalized understanding, family verbalized they are comfortable with foley care because they have managing it at home before. Patient denies any pain/distress, no wound noted, skin intact.

## 2015-12-13 ENCOUNTER — Ambulatory Visit (INDEPENDENT_AMBULATORY_CARE_PROVIDER_SITE_OTHER): Payer: Medicare Other | Admitting: Podiatry

## 2015-12-13 DIAGNOSIS — B351 Tinea unguium: Secondary | ICD-10-CM

## 2015-12-13 DIAGNOSIS — L608 Other nail disorders: Secondary | ICD-10-CM

## 2015-12-13 DIAGNOSIS — M79609 Pain in unspecified limb: Secondary | ICD-10-CM

## 2015-12-13 DIAGNOSIS — L603 Nail dystrophy: Secondary | ICD-10-CM

## 2015-12-19 NOTE — Progress Notes (Signed)
SUBJECTIVE Patient  presents to office today complaining of elongated, thickened nails. Pain while ambulating in shoes. Patient is unable to trim their own nails.   OBJECTIVE General Patient is awake, alert, and oriented x 3 and in no acute distress. Derm Skin is dry and supple bilateral. Negative open lesions or macerations. Remaining integument unremarkable. Nails are tender, long, thickened and dystrophic with subungual debris, consistent with onychomycosis, 1-5 bilateral. No signs of infection noted. Vasc  DP and PT pedal pulses palpable bilaterally. Temperature gradient within normal limits.  Neuro Epicritic and protective threshold sensation diminished bilaterally.  Musculoskeletal Exam No symptomatic pedal deformities noted bilateral. Muscular strength within normal limits.  ASSESSMENT 1. Onychodystrophic nails 1-5 bilateral with hyperkeratosis of nails.  2. Onychomycosis of nail due to dermatophyte bilateral 3. Pain in foot bilateral  PLAN OF CARE 1. Patient evaluated today.  2. Instructed to maintain good pedal hygiene and foot care.  3. Mechanical debridement of nails 1-5 bilaterally performed using a nail nipper. Filed with dremel without incident.  4. Silicone toe caps dispensed to offload great toenail which is painful  5. Return to clinic in 3 mos.    Felecia ShellingBrent M Kathrine Rieves, DPM

## 2016-01-05 ENCOUNTER — Telehealth: Payer: Self-pay | Admitting: Neurology

## 2016-01-05 NOTE — Telephone Encounter (Addendum)
Returned call to patient's daughter on HIPAA - she reports a steady worsening of memory, increased gait problems and increased fatigue over the last few weeks.  His decline started with a UTI in November.  He was treated and rechecked for continued infection last week (negative).  He has been added to NP schedule (last seen in February 2017).

## 2016-01-05 NOTE — Telephone Encounter (Signed)
Patients daughter calling concerned about patient.  States patient has been having trouble with right foot, as if he has no control over it.  Patient has been sleeping a lot more not wanting to get up out of bed, and not very alert.  Has been going on since before Christmas yet worsening over New Year Holiday.  Please call

## 2016-01-06 ENCOUNTER — Ambulatory Visit (INDEPENDENT_AMBULATORY_CARE_PROVIDER_SITE_OTHER): Payer: PPO | Admitting: Adult Health

## 2016-01-06 ENCOUNTER — Encounter: Payer: Self-pay | Admitting: Adult Health

## 2016-01-06 VITALS — BP 136/80 | HR 75

## 2016-01-06 DIAGNOSIS — F0391 Unspecified dementia with behavioral disturbance: Secondary | ICD-10-CM | POA: Diagnosis not present

## 2016-01-06 DIAGNOSIS — R4189 Other symptoms and signs involving cognitive functions and awareness: Secondary | ICD-10-CM | POA: Diagnosis not present

## 2016-01-06 DIAGNOSIS — R479 Unspecified speech disturbances: Secondary | ICD-10-CM | POA: Diagnosis not present

## 2016-01-06 DIAGNOSIS — R0689 Other abnormalities of breathing: Secondary | ICD-10-CM

## 2016-01-06 DIAGNOSIS — R410 Disorientation, unspecified: Secondary | ICD-10-CM | POA: Diagnosis not present

## 2016-01-06 NOTE — Patient Instructions (Signed)
Blood work today Chest x-ray MRI brain  If your symptoms worsen or you develop new symptoms please let us know.

## 2016-01-06 NOTE — Progress Notes (Signed)
PATIENT: Jeremy [name] DOB: 03-Oct-1925  REASON FOR VISIT: follow up- dementia HISTORY FROM: patient  HISTORY OF PRESENT ILLNESS:  HISTORY per Dr. Zannie Cove notes:Dandy T Riversis a 83 years old right-handed male, seen in refer by  His primary care physician Dr. Dorothyann Peng, accompanied by his daughter [name] in October 31st 2016 for evaluation of memory trouble, confusion, difficulty with language  He gradudate from high school, worked at [employer], retired at 65, continue to be active, enjoyed traveling, bowling,was doing exercise classes regularly until end of September 2016, he had fairly acute onset gait difficulty, urinary retention, was also found to be confused, difficulty talking,  He was admitted to hospital in October 9 to 11 2016 for right hip pain,lower abdominal pain, CAT scan showed right hip arthritis. I have personally reviewed CAT scan of brain, moderate periventricular small vessel disease, no acute lesions.  He is now back home with home rehabilitation, ambulate with a walker, had Foley catheter in, going through urology evaluation, he has lost both of his vision around 2013 due to glaucoma, macular degenerations, was highly function with his visual loss, able to fix simple breakfast use microwave. But now he ambulate with a walker, unsteady stiff gait, need more help in daily activity.   UPDATE Nov 23 2014: He is with his daughter [name] at today's clinical visit, he was recently admitted to hospital for UTI in November 2016, urinary retention, had Foley catheter placed since, daughter reported elevated PSA, is under close observation of urologist Since hospital admission in November 2016, he has increased confusion, he is now discharged to [facility] place, he could no longer feed himself, poor vision, need assistant to get up to take a few steps, poor appetite, Workup reviewed, CBC, BMET unremarkable, UA positive for UTI.normal TSH, B12, negative HIV, RPR I  have personally reviewed MRI of the brain November 2016: No acute intracranial abnormality. Nonspecific cerebral white matter changes.  UPDATE Feb 25 2015: He was admitted to hospital admission in Dec 2016, I reviewed and summarized his hospital discharge, he was admitted for aspiration pneumonia, UTI, blood around Foley catheter, he is now back home with 24 hours aids since Dec 2016,  lives with his wife, He is taking seroquel 25mg  qhs,  He is receiving PT, OT now, walk with walker, overall he is much better at home   Today 01/06/16:  Mr. Fouts is a 83 year old male with a history of dementia. He returns today for follow-up. The patient's family reports that the patient was in the hospital in November for blood clots in his urine. He was also diagnosed with a urinary tract infection. They state that since his hospitalization his cognition has declined. The family states before his hospitalization he was able to do some ADLs independently ncluding  feeding and bathing. The patient also can carry on a conversation with his family. She reports that since the hospitalization and over the last several weeks he is not feeding himself. Family reports that if you give him a spoon he will just look at it. They also report that he cannot a complete sentence. He is also no longer ambulating as he was before. He is now using a wheelchair. The patient does have a indwelling catheter has been in place since October 2016. He reports that a home health nurse took a urine sample 2 weeks ago and reports that it was negative for UTI. The family does report that the patient over the last month has been  complaining of tremors in the right hand and the right foot feeling heavy. He occasionally complains of neck pain particularly in the morning. The family is also noticed personality changes. They report that he used to be very pleasant and easy going However now they describe his personality as "mean and fussy" Family also  reports that he's been having a cough. Occasionally he will cough up phlegm. Denies any fevers. Patient returns today for an evaluation.  REVIEW OF SYSTEMS: Out of a complete 14 system review of symptoms, the patient complains only of the following symptoms, and all other reviewed systems are negative.  See HPI  ALLERGIES: Allergies  Allergen Reactions  . Tramadol Nausea And Vomiting    HOME MEDICATIONS: Outpatient Medications Prior to Visit  Medication Sig Dispense Refill  . CRANBERRY PO Take 1 tablet by mouth every evening.     Marland Kitchen. DOK 100 MG capsule Take 100 mg by mouth at bedtime.  5  . dorzolamide (TRUSOPT) 2 % ophthalmic solution Place 1 drop into the right eye 2 (two) times daily.     . finasteride (PROSCAR) 5 MG tablet Take 5 mg by mouth every evening.     . iron polysaccharides (NIFEREX) 150 MG capsule Take 1 capsule (150 mg total) by mouth daily. 30 capsule 1  . latanoprost (XALATAN) 0.005 % ophthalmic solution Place 1 drop into both eyes at bedtime.    Marland Kitchen. levothyroxine (SYNTHROID, LEVOTHROID) 75 MCG tablet Take 75 mcg by mouth daily before breakfast.   0  . midodrine (PROAMATINE) 5 MG tablet Take 1 tablet (5 mg total) by mouth 3 (three) times daily with meals. 270 tablet 3  . OVER THE COUNTER MEDICATION Take 1 tablet by mouth every Monday, Wednesday, and Friday. With lunch.  Drop 1 tablet in water daily and drink- Over the counter [employer]    . QUEtiapine (SEROQUEL) 25 MG tablet Take 1 tablet (25 mg total) by mouth at bedtime. For behaviors (Patient taking differently: Take 25 mg by mouth at bedtime. ) 90 tablet 3  . ranitidine (ZANTAC) 75 MG tablet Take 75 mg by mouth every morning. Reported on 06/08/2015    . simvastatin (ZOCOR) 40 MG tablet Take 40 mg by mouth daily at 6 PM.     . timolol (TIMOPTIC) 0.5 % ophthalmic solution Place 1 drop into both eyes 2 (two) times daily.     . Vitamin D, Ergocalciferol, (DRISDOL) 50000 units CAPS capsule Take 50,000 Units by mouth 2 (two)  times a week. Monday and Thursday in the evening    . fludrocortisone (FLORINEF) 0.1 MG tablet Take 0.5 tablets (0.05 mg total) by mouth every morning. 30 tablet 1   No facility-administered medications prior to visit.     PAST MEDICAL HISTORY: Past Medical History:  Diagnosis Date  . CAD (coronary artery disease)   . Dementia   . HTN (hypertension)   . Hyperlipidemia   . Hypotension   . Hypothyroidism    s/p Thyroidectomy, partial  . Sepsis (HCC)   . Syncope    4/11 (hospitalized) and 4/13  . Syncope   . UTI (lower urinary tract infection)     PAST SURGICAL HISTORY: Past Surgical History:  Procedure Laterality Date  . BACK SURGERY    . CARDIAC CATHETERIZATION  02/25/2002   Proximal L Circumflex 95% lesion, stented w/ 3x18-mm CYPHER stent avoiding the 2 L Marginal branch, jailing the 1st marginal, resulting in reduction of a 90-95% lesion to 0% residual  . CARDIOVASCULAR  STRESS TEST  12/18/2006   EKG negative for ischemia, no significant ischemia demonstrated.  . CORONARY ANGIOPLASTY WITH STENT PLACEMENT    . RENAL DOPPLER  08/17/2005   Normal evaluation  . THYROIDECTOMY, PARTIAL    . TRANSTHORACIC ECHOCARDIOGRAM  03/13/2002   EF >55%, moderate LVH,     FAMILY HISTORY: Family History  Problem Relation Age of Onset  . Family history unknown: Yes    SOCIAL HISTORY: Social History   Social History  . Marital status: Married    Spouse name: N/A  . Number of children: 4  . Years of education: HS   Occupational History  . Retired    Social History Main Topics  . Smoking status: Former Games developer  . Smokeless tobacco: Never Used     Comment: Quit 50+ years ago.  . Alcohol use No  . Drug use: No  . Sexual activity: Not on file   Other Topics Concern  . Not on file   Social History Narrative   Lives with his wife.   Right-handed.   No caffeine use.      PHYSICAL EXAM  Vitals:   01/06/16 1437  BP: 136/80  Pulse: 75   There is no height or weight on  file to calculate BMI.  MMSE - Mini Mental State Exam 01/06/2016  Orientation to time 0  Orientation to Place 2  Registration 0  Attention/ Calculation 0  Recall 0  Language- name 2 objects 0  Language- name 2 objects-comments blind  Language- repeat 0  Language- follow 3 step command 3  Language- read & follow direction 0  Language-read & follow direction-comments blind  Write a sentence 0  Write a sentence-comments blind  Copy design 0  Copy design-comments blind  Total score 5       Generalized: Well developed, in no acute distress  Chest: Decrease breath sounds in right and left lower lobe  Neurological examination  Mentation: Alert. Follows all commands speech and language fluent Cranial nerve II-XII: Pupils were equal round reactive to light. Extraocular movements were full. Patient blind. Facial sensation and strength were normal. Uvula tongue midline. Head turning and shoulder shrug  were normal and symmetric. Motor: The motor testing reveals 5 over 5 strength of all 4 extremities. Good symmetric motor tone is noted throughout.  Sensory: Sensory testing is intact to soft touch on all 4 extremities. No evidence of extinction is noted.  Coordination: unable to test finger-nose-finger- patient is blind Gait and station: wheelchair blind.  Reflexes: Deep tendon reflexes are symmetric and normal bilaterally.   DIAGNOSTIC DATA (LABS, IMAGING, TESTING) - I reviewed patient records, labs, notes, testing and imaging myself where available.  Lab Results  Component Value Date   WBC 12.0 (H) 11/28/2015   HGB 8.6 (L) 11/28/2015   HCT 25.8 (L) 11/28/2015   MCV 87.2 11/28/2015   PLT 223 11/28/2015      Component Value Date/Time   NA 137 11/28/2015 0515   NA 141 11/13/2014   K 3.7 11/28/2015 0515   CL 106 11/28/2015 0515   CO2 24 11/28/2015 0515   GLUCOSE 85 11/28/2015 0515   BUN 27 (H) 11/28/2015 0515   BUN 19 11/13/2014   CREATININE 1.10 11/28/2015 0515   CALCIUM 8.7  (L) 11/28/2015 0515   PROT 8.2 (H) 10/27/2015 1603   ALBUMIN 3.5 10/27/2015 1603   AST 24 10/27/2015 1603   ALT 11 (L) 10/27/2015 1603   ALKPHOS 73 10/27/2015 1603   BILITOT 0.6  10/27/2015 1603   GFRNONAA 58 (L) 11/28/2015 0515   GFRAA >60 11/28/2015 0515   Lab Results  Component Value Date   CHOL 123 12/01/2014   HDL 44 12/01/2014   LDLCALC 60 12/01/2014   TRIG 86 12/01/2014   Lab Results  Component Value Date   HGBA1C 6.4 (H) 10/13/2014   Lab Results  Component Value Date   VITAMINB12 396 11/07/2014   Lab Results  Component Value Date   TSH 2.759 04/21/2015      ASSESSMENT AND PLAN 83 y.o. year old male  has a past medical history of CAD (coronary artery disease); Dementia; HTN (hypertension); Hyperlipidemia; Hypotension; Hypothyroidism; Sepsis (HCC); Syncope; Syncope; and UTI (lower urinary tract infection). here with:  1.Dementia 2. Confusion 3. Impaired Cognition 4. Decreased breath sounds  Patient has had worsening confusion since his hospitalization in November. Now requiring assistance with most ADLs. Unable to participate in a meaningful conversation. Patient will be sent for MRI of Brain w/wo contrast to rule out any acute causes such as stroke that could be contributing to his confusion. Will also check blood work and UA to look for possible cause of confusion. Patient did have decreased breath sounds will send for chest x-ray to r/o infection. Patient and family are amendable to plan. He will f/u in 2 months with Dr. Terrace Arabia or sooner if needed.     Butch Penny, MSN, NP-C 01/06/2016, 2:45 PM [facility] [address] [place], [place] [address] [phone]

## 2016-01-06 NOTE — Progress Notes (Signed)
Urinalysis ordered by Aundra MilletMegan, NP for this pt with a foley. Name and DOB verified. Tubing for foley bag kinked, pt allowed to sit for 30 minutes. 30 mL of urine extracted from blue port on foley after the port was cleaned with an alcohol wipe. Tubing was then unkinked. Urine drained freely into foley bag with no obstructions/complications. Pt tolerated well. Pt in NAD and left the office. Urine specimen brought to lab, given to [facility] representative.

## 2016-01-07 LAB — MICROSCOPIC EXAMINATION
CASTS: NONE SEEN /LPF
WBC, UA: >30 /hpf — AB (ref 0–?)

## 2016-01-07 LAB — COMPREHENSIVE METABOLIC PANEL
ALBUMIN: 3.1 g/dL — AB (ref 3.5–4.7)
ALK PHOS: 82 IU/L (ref 39–117)
ALT: 17 IU/L (ref 0–44)
AST: 31 IU/L (ref 0–40)
Albumin/Globulin Ratio: 0.5 — ABNORMAL LOW (ref 1.2–2.2)
BUN / CREAT RATIO: 18 (ref 10–24)
BUN: 19 mg/dL (ref 8–27)
Bilirubin Total: 0.3 mg/dL (ref 0.0–1.2)
CO2: 19 mmol/L (ref 18–29)
CREATININE: 1.05 mg/dL (ref 0.76–1.27)
Calcium: 9.1 mg/dL (ref 8.6–10.2)
Chloride: 100 mmol/L (ref 96–106)
GFR calc non Af Amer: 63 mL/min/{1.73_m2} (ref >59)
GFR, EST AFRICAN AMERICAN: 73 mL/min/{1.73_m2} (ref >59)
GLOBULIN, TOTAL: 5.8 g/dL — AB (ref 1.5–4.5)
GLUCOSE: 86 mg/dL (ref 65–99)
Potassium: 4.3 mmol/L (ref 3.5–5.2)
SODIUM: 138 mmol/L (ref 134–144)
TOTAL PROTEIN: 8.9 g/dL — AB (ref 6.0–8.5)

## 2016-01-07 LAB — URINALYSIS, ROUTINE W REFLEX MICROSCOPIC
Bilirubin, UA: NEGATIVE
Glucose, UA: NEGATIVE
Ketones, UA: NEGATIVE
NITRITE UA: NEGATIVE
PH UA: 5 (ref 5.0–7.5)
SPEC GRAV UA: 1.019 (ref 1.005–1.030)
Urobilinogen, Ur: 0.2 mg/dL (ref 0.2–1.0)

## 2016-01-07 LAB — CBC WITH DIFFERENTIAL/PLATELET
BASOS: 0 %
Basophils Absolute: 0 10*3/uL (ref 0.0–0.2)
EOS (ABSOLUTE): 0.3 10*3/uL (ref 0.0–0.4)
EOS: 2 %
HEMOGLOBIN: 8.4 g/dL — AB (ref 13.0–17.7)
Hematocrit: 27.8 % — ABNORMAL LOW (ref 37.5–51.0)
IMMATURE GRANS (ABS): 0 10*3/uL (ref 0.0–0.1)
IMMATURE GRANULOCYTES: 0 %
LYMPHS: 11 %
Lymphocytes Absolute: 1.7 10*3/uL (ref 0.7–3.1)
MCH: 26 pg — AB (ref 26.6–33.0)
MCHC: 30.2 g/dL — ABNORMAL LOW (ref 31.5–35.7)
MCV: 86 fL (ref 79–97)
MONOCYTES: 8 %
Monocytes Absolute: 1.2 10*3/uL — ABNORMAL HIGH (ref 0.1–0.9)
NEUTROS ABS: 12.5 10*3/uL — AB (ref 1.4–7.0)
NEUTROS PCT: 79 %
PLATELETS: 376 10*3/uL (ref 150–379)
RBC: 3.23 x10E6/uL — ABNORMAL LOW (ref 4.14–5.80)
RDW: 14.8 % (ref 12.3–15.4)
WBC: 15.7 10*3/uL — ABNORMAL HIGH (ref 3.4–10.8)

## 2016-01-07 NOTE — Progress Notes (Signed)
I have reviewed and agreed above plan. 

## 2016-01-11 ENCOUNTER — Telehealth: Payer: Self-pay | Admitting: Adult Health

## 2016-01-11 NOTE — Telephone Encounter (Signed)
I called and spoke to the patient's daughter. I explained that his urinalysis was positive for leukocytes and red blood cells. It was negative for nitrates. His CBC also indicated a increased white blood cell count. The patient has not had a chest x-ray yet. I have advised that I will fax over these lab results to his urologist- at [facility] and his PCP for review. I advised that I would let his urologist decide if he wanted to treat for urinary tract infection. Daughter is amenable to this plan.

## 2016-01-11 NOTE — Telephone Encounter (Signed)
Faxed to Arnette FeltsJanece [name], NP.  [phone] ) received confirmation.

## 2016-01-12 DIAGNOSIS — I4891 Unspecified atrial fibrillation: Secondary | ICD-10-CM | POA: Diagnosis not present

## 2016-01-12 DIAGNOSIS — N401 Enlarged prostate with lower urinary tract symptoms: Secondary | ICD-10-CM | POA: Diagnosis not present

## 2016-01-12 DIAGNOSIS — I130 Hypertensive heart and chronic kidney disease with heart failure and stage 1 through stage 4 chronic kidney disease, or unspecified chronic kidney disease: Secondary | ICD-10-CM | POA: Diagnosis not present

## 2016-01-12 DIAGNOSIS — I5032 Chronic diastolic (congestive) heart failure: Secondary | ICD-10-CM | POA: Diagnosis not present

## 2016-01-12 DIAGNOSIS — I69320 Aphasia following cerebral infarction: Secondary | ICD-10-CM | POA: Diagnosis not present

## 2016-01-12 DIAGNOSIS — M150 Primary generalized (osteo)arthritis: Secondary | ICD-10-CM | POA: Diagnosis not present

## 2016-01-12 DIAGNOSIS — H548 Legal blindness, as defined in USA: Secondary | ICD-10-CM | POA: Diagnosis not present

## 2016-01-12 DIAGNOSIS — R338 Other retention of urine: Secondary | ICD-10-CM | POA: Diagnosis not present

## 2016-01-12 DIAGNOSIS — F0390 Unspecified dementia without behavioral disturbance: Secondary | ICD-10-CM | POA: Diagnosis not present

## 2016-01-12 DIAGNOSIS — Z466 Encounter for fitting and adjustment of urinary device: Secondary | ICD-10-CM | POA: Diagnosis not present

## 2016-01-12 DIAGNOSIS — I2510 Atherosclerotic heart disease of native coronary artery without angina pectoris: Secondary | ICD-10-CM | POA: Diagnosis not present

## 2016-01-12 DIAGNOSIS — I951 Orthostatic hypotension: Secondary | ICD-10-CM | POA: Diagnosis not present

## 2016-01-12 DIAGNOSIS — N182 Chronic kidney disease, stage 2 (mild): Secondary | ICD-10-CM | POA: Diagnosis not present

## 2016-01-13 ENCOUNTER — Ambulatory Visit
Admission: RE | Admit: 2016-01-13 | Discharge: 2016-01-13 | Disposition: A | Discharge status: Home / Self Care | Payer: PPO | Source: Ambulatory Visit | Attending: Adult Health | Admitting: Adult Health

## 2016-01-13 ENCOUNTER — Telehealth: Payer: Self-pay | Admitting: *Deleted

## 2016-01-13 DIAGNOSIS — R0689 Other abnormalities of breathing: Secondary | ICD-10-CM

## 2016-01-13 DIAGNOSIS — R4189 Other symptoms and signs involving cognitive functions and awareness: Secondary | ICD-10-CM

## 2016-01-13 DIAGNOSIS — R410 Disorientation, unspecified: Secondary | ICD-10-CM

## 2016-01-13 DIAGNOSIS — R05 Cough: Secondary | ICD-10-CM | POA: Diagnosis not present

## 2016-01-13 NOTE — Telephone Encounter (Signed)
Per Dolores HooseM Millikan, NP, spoke with daughter, Stanton KidneyDebra and informed her patient's chest x ray is normal. She inquired about his MRI; advised it is 01/19/16. She verbalized understanding, appreciation.

## 2016-01-17 DIAGNOSIS — R338 Other retention of urine: Secondary | ICD-10-CM | POA: Diagnosis not present

## 2016-01-17 DIAGNOSIS — R972 Elevated prostate specific antigen [PSA]: Secondary | ICD-10-CM | POA: Diagnosis not present

## 2016-01-17 DIAGNOSIS — R310 Gross hematuria: Secondary | ICD-10-CM | POA: Diagnosis not present

## 2016-01-17 DIAGNOSIS — N401 Enlarged prostate with lower urinary tract symptoms: Secondary | ICD-10-CM | POA: Diagnosis not present

## 2016-01-18 DIAGNOSIS — N182 Chronic kidney disease, stage 2 (mild): Secondary | ICD-10-CM | POA: Diagnosis not present

## 2016-01-18 DIAGNOSIS — I4891 Unspecified atrial fibrillation: Secondary | ICD-10-CM | POA: Diagnosis not present

## 2016-01-18 DIAGNOSIS — R338 Other retention of urine: Secondary | ICD-10-CM | POA: Diagnosis not present

## 2016-01-18 DIAGNOSIS — I2510 Atherosclerotic heart disease of native coronary artery without angina pectoris: Secondary | ICD-10-CM | POA: Diagnosis not present

## 2016-01-18 DIAGNOSIS — N401 Enlarged prostate with lower urinary tract symptoms: Secondary | ICD-10-CM | POA: Diagnosis not present

## 2016-01-18 DIAGNOSIS — F0390 Unspecified dementia without behavioral disturbance: Secondary | ICD-10-CM | POA: Diagnosis not present

## 2016-01-18 DIAGNOSIS — Z466 Encounter for fitting and adjustment of urinary device: Secondary | ICD-10-CM | POA: Diagnosis not present

## 2016-01-18 DIAGNOSIS — H548 Legal blindness, as defined in USA: Secondary | ICD-10-CM | POA: Diagnosis not present

## 2016-01-18 DIAGNOSIS — I130 Hypertensive heart and chronic kidney disease with heart failure and stage 1 through stage 4 chronic kidney disease, or unspecified chronic kidney disease: Secondary | ICD-10-CM | POA: Diagnosis not present

## 2016-01-18 DIAGNOSIS — M150 Primary generalized (osteo)arthritis: Secondary | ICD-10-CM | POA: Diagnosis not present

## 2016-01-18 DIAGNOSIS — I5032 Chronic diastolic (congestive) heart failure: Secondary | ICD-10-CM | POA: Diagnosis not present

## 2016-01-18 DIAGNOSIS — I951 Orthostatic hypotension: Secondary | ICD-10-CM | POA: Diagnosis not present

## 2016-01-18 DIAGNOSIS — I69320 Aphasia following cerebral infarction: Secondary | ICD-10-CM | POA: Diagnosis not present

## 2016-01-19 ENCOUNTER — Other Ambulatory Visit: Payer: Medicare Other

## 2016-01-25 DIAGNOSIS — I130 Hypertensive heart and chronic kidney disease with heart failure and stage 1 through stage 4 chronic kidney disease, or unspecified chronic kidney disease: Secondary | ICD-10-CM | POA: Diagnosis not present

## 2016-01-25 DIAGNOSIS — R338 Other retention of urine: Secondary | ICD-10-CM | POA: Diagnosis not present

## 2016-01-25 DIAGNOSIS — I69320 Aphasia following cerebral infarction: Secondary | ICD-10-CM | POA: Diagnosis not present

## 2016-01-25 DIAGNOSIS — N182 Chronic kidney disease, stage 2 (mild): Secondary | ICD-10-CM | POA: Diagnosis not present

## 2016-01-25 DIAGNOSIS — I5032 Chronic diastolic (congestive) heart failure: Secondary | ICD-10-CM | POA: Diagnosis not present

## 2016-01-25 DIAGNOSIS — I2510 Atherosclerotic heart disease of native coronary artery without angina pectoris: Secondary | ICD-10-CM | POA: Diagnosis not present

## 2016-01-25 DIAGNOSIS — F0390 Unspecified dementia without behavioral disturbance: Secondary | ICD-10-CM | POA: Diagnosis not present

## 2016-01-25 DIAGNOSIS — N401 Enlarged prostate with lower urinary tract symptoms: Secondary | ICD-10-CM | POA: Diagnosis not present

## 2016-01-25 DIAGNOSIS — I4891 Unspecified atrial fibrillation: Secondary | ICD-10-CM | POA: Diagnosis not present

## 2016-01-25 DIAGNOSIS — H548 Legal blindness, as defined in USA: Secondary | ICD-10-CM | POA: Diagnosis not present

## 2016-01-25 DIAGNOSIS — I951 Orthostatic hypotension: Secondary | ICD-10-CM | POA: Diagnosis not present

## 2016-01-25 DIAGNOSIS — M150 Primary generalized (osteo)arthritis: Secondary | ICD-10-CM | POA: Diagnosis not present

## 2016-01-25 DIAGNOSIS — Z466 Encounter for fitting and adjustment of urinary device: Secondary | ICD-10-CM | POA: Diagnosis not present

## 2016-01-26 ENCOUNTER — Ambulatory Visit
Admission: RE | Admit: 2016-01-26 | Discharge: 2016-01-26 | Disposition: A | Discharge status: Home / Self Care | Payer: PPO | Source: Ambulatory Visit | Attending: Adult Health | Admitting: Adult Health

## 2016-01-26 ENCOUNTER — Other Ambulatory Visit: Payer: Self-pay | Admitting: Adult Health

## 2016-01-26 DIAGNOSIS — R410 Disorientation, unspecified: Secondary | ICD-10-CM | POA: Diagnosis not present

## 2016-01-26 DIAGNOSIS — R4189 Other symptoms and signs involving cognitive functions and awareness: Secondary | ICD-10-CM

## 2016-01-28 ENCOUNTER — Telehealth: Payer: Self-pay | Admitting: *Deleted

## 2016-01-28 NOTE — Telephone Encounter (Signed)
-----   Message from Butch PennyMegan Millikan, NP sent at 01/28/2016 11:34 AM EST ----- On comparsion some progression of atrophy but no acute changes such as a stroke

## 2016-01-28 NOTE — Telephone Encounter (Signed)
I spoke to Jeremy [name], relayed that DPR for other sister to be signed.  (father is living with her now).  Gave MRI results to her as per below (atrophy progression, no stroke).  She would relay and verbalized understanding.

## 2016-02-01 DIAGNOSIS — N182 Chronic kidney disease, stage 2 (mild): Secondary | ICD-10-CM | POA: Diagnosis not present

## 2016-02-01 DIAGNOSIS — I951 Orthostatic hypotension: Secondary | ICD-10-CM | POA: Diagnosis not present

## 2016-02-01 DIAGNOSIS — I130 Hypertensive heart and chronic kidney disease with heart failure and stage 1 through stage 4 chronic kidney disease, or unspecified chronic kidney disease: Secondary | ICD-10-CM | POA: Diagnosis not present

## 2016-02-01 DIAGNOSIS — I5032 Chronic diastolic (congestive) heart failure: Secondary | ICD-10-CM | POA: Diagnosis not present

## 2016-02-01 DIAGNOSIS — H548 Legal blindness, as defined in USA: Secondary | ICD-10-CM | POA: Diagnosis not present

## 2016-02-01 DIAGNOSIS — Z466 Encounter for fitting and adjustment of urinary device: Secondary | ICD-10-CM | POA: Diagnosis not present

## 2016-02-01 DIAGNOSIS — M150 Primary generalized (osteo)arthritis: Secondary | ICD-10-CM | POA: Diagnosis not present

## 2016-02-01 DIAGNOSIS — I4891 Unspecified atrial fibrillation: Secondary | ICD-10-CM | POA: Diagnosis not present

## 2016-02-01 DIAGNOSIS — I2510 Atherosclerotic heart disease of native coronary artery without angina pectoris: Secondary | ICD-10-CM | POA: Diagnosis not present

## 2016-02-01 DIAGNOSIS — I69320 Aphasia following cerebral infarction: Secondary | ICD-10-CM | POA: Diagnosis not present

## 2016-02-01 DIAGNOSIS — N401 Enlarged prostate with lower urinary tract symptoms: Secondary | ICD-10-CM | POA: Diagnosis not present

## 2016-02-01 DIAGNOSIS — R338 Other retention of urine: Secondary | ICD-10-CM | POA: Diagnosis not present

## 2016-02-01 DIAGNOSIS — F0390 Unspecified dementia without behavioral disturbance: Secondary | ICD-10-CM | POA: Diagnosis not present

## 2016-02-03 DIAGNOSIS — R34 Anuria and oliguria: Secondary | ICD-10-CM | POA: Diagnosis not present

## 2016-02-03 DIAGNOSIS — R310 Gross hematuria: Secondary | ICD-10-CM | POA: Diagnosis not present

## 2016-02-07 DIAGNOSIS — F0390 Unspecified dementia without behavioral disturbance: Secondary | ICD-10-CM | POA: Diagnosis not present

## 2016-02-07 DIAGNOSIS — I2510 Atherosclerotic heart disease of native coronary artery without angina pectoris: Secondary | ICD-10-CM | POA: Diagnosis not present

## 2016-02-07 DIAGNOSIS — I69320 Aphasia following cerebral infarction: Secondary | ICD-10-CM | POA: Diagnosis not present

## 2016-02-07 DIAGNOSIS — M150 Primary generalized (osteo)arthritis: Secondary | ICD-10-CM | POA: Diagnosis not present

## 2016-02-07 DIAGNOSIS — N182 Chronic kidney disease, stage 2 (mild): Secondary | ICD-10-CM | POA: Diagnosis not present

## 2016-02-07 DIAGNOSIS — R338 Other retention of urine: Secondary | ICD-10-CM | POA: Diagnosis not present

## 2016-02-07 DIAGNOSIS — N401 Enlarged prostate with lower urinary tract symptoms: Secondary | ICD-10-CM | POA: Diagnosis not present

## 2016-02-07 DIAGNOSIS — H548 Legal blindness, as defined in USA: Secondary | ICD-10-CM | POA: Diagnosis not present

## 2016-02-07 DIAGNOSIS — Z466 Encounter for fitting and adjustment of urinary device: Secondary | ICD-10-CM | POA: Diagnosis not present

## 2016-02-07 DIAGNOSIS — I5032 Chronic diastolic (congestive) heart failure: Secondary | ICD-10-CM | POA: Diagnosis not present

## 2016-02-07 DIAGNOSIS — I4891 Unspecified atrial fibrillation: Secondary | ICD-10-CM | POA: Diagnosis not present

## 2016-02-07 DIAGNOSIS — I951 Orthostatic hypotension: Secondary | ICD-10-CM | POA: Diagnosis not present

## 2016-02-07 DIAGNOSIS — I130 Hypertensive heart and chronic kidney disease with heart failure and stage 1 through stage 4 chronic kidney disease, or unspecified chronic kidney disease: Secondary | ICD-10-CM | POA: Diagnosis not present

## 2016-02-08 ENCOUNTER — Encounter (HOSPITAL_COMMUNITY): Payer: Self-pay | Admitting: Emergency Medicine

## 2016-02-08 ENCOUNTER — Inpatient Hospital Stay (HOSPITAL_COMMUNITY)
Admission: EM | Admit: 2016-02-08 | Discharge: 2016-02-13 | DRG: 698 | Disposition: A | Discharge status: Hospice | Payer: PPO | Attending: Internal Medicine | Admitting: Internal Medicine

## 2016-02-08 DIAGNOSIS — N1330 Unspecified hydronephrosis: Secondary | ICD-10-CM

## 2016-02-08 DIAGNOSIS — N19 Unspecified kidney failure: Secondary | ICD-10-CM

## 2016-02-08 DIAGNOSIS — Y846 Urinary catheterization as the cause of abnormal reaction of the patient, or of later complication, without mention of misadventure at the time of the procedure: Secondary | ICD-10-CM | POA: Diagnosis present

## 2016-02-08 DIAGNOSIS — G9341 Metabolic encephalopathy: Secondary | ICD-10-CM | POA: Diagnosis not present

## 2016-02-08 DIAGNOSIS — D509 Iron deficiency anemia, unspecified: Secondary | ICD-10-CM | POA: Diagnosis present

## 2016-02-08 DIAGNOSIS — A419 Sepsis, unspecified organism: Secondary | ICD-10-CM | POA: Diagnosis not present

## 2016-02-08 DIAGNOSIS — N390 Urinary tract infection, site not specified: Secondary | ICD-10-CM | POA: Diagnosis present

## 2016-02-08 DIAGNOSIS — R1310 Dysphagia, unspecified: Secondary | ICD-10-CM | POA: Diagnosis not present

## 2016-02-08 DIAGNOSIS — N138 Other obstructive and reflux uropathy: Secondary | ICD-10-CM

## 2016-02-08 DIAGNOSIS — Z681 Body mass index (BMI) 19 or less, adult: Secondary | ICD-10-CM | POA: Diagnosis not present

## 2016-02-08 DIAGNOSIS — R319 Hematuria, unspecified: Secondary | ICD-10-CM | POA: Diagnosis present

## 2016-02-08 DIAGNOSIS — N312 Flaccid neuropathic bladder, not elsewhere classified: Secondary | ICD-10-CM | POA: Diagnosis not present

## 2016-02-08 DIAGNOSIS — I2510 Atherosclerotic heart disease of native coronary artery without angina pectoris: Secondary | ICD-10-CM | POA: Diagnosis not present

## 2016-02-08 DIAGNOSIS — D638 Anemia in other chronic diseases classified elsewhere: Secondary | ICD-10-CM | POA: Diagnosis present

## 2016-02-08 DIAGNOSIS — Z87891 Personal history of nicotine dependence: Secondary | ICD-10-CM

## 2016-02-08 DIAGNOSIS — R600 Localized edema: Secondary | ICD-10-CM | POA: Diagnosis present

## 2016-02-08 DIAGNOSIS — D62 Acute posthemorrhagic anemia: Secondary | ICD-10-CM | POA: Diagnosis not present

## 2016-02-08 DIAGNOSIS — E43 Unspecified severe protein-calorie malnutrition: Secondary | ICD-10-CM | POA: Diagnosis present

## 2016-02-08 DIAGNOSIS — E890 Postprocedural hypothyroidism: Secondary | ICD-10-CM | POA: Diagnosis present

## 2016-02-08 DIAGNOSIS — R310 Gross hematuria: Secondary | ICD-10-CM

## 2016-02-08 DIAGNOSIS — B952 Enterococcus as the cause of diseases classified elsewhere: Secondary | ICD-10-CM | POA: Diagnosis present

## 2016-02-08 DIAGNOSIS — T83511A Infection and inflammatory reaction due to indwelling urethral catheter, initial encounter: Secondary | ICD-10-CM

## 2016-02-08 DIAGNOSIS — I951 Orthostatic hypotension: Secondary | ICD-10-CM | POA: Diagnosis not present

## 2016-02-08 DIAGNOSIS — N401 Enlarged prostate with lower urinary tract symptoms: Secondary | ICD-10-CM

## 2016-02-08 DIAGNOSIS — Z955 Presence of coronary angioplasty implant and graft: Secondary | ICD-10-CM

## 2016-02-08 DIAGNOSIS — D72829 Elevated white blood cell count, unspecified: Secondary | ICD-10-CM

## 2016-02-08 DIAGNOSIS — Z66 Do not resuscitate: Secondary | ICD-10-CM | POA: Diagnosis not present

## 2016-02-08 DIAGNOSIS — R109 Unspecified abdominal pain: Secondary | ICD-10-CM

## 2016-02-08 DIAGNOSIS — F0391 Unspecified dementia with behavioral disturbance: Secondary | ICD-10-CM | POA: Diagnosis present

## 2016-02-08 DIAGNOSIS — R339 Retention of urine, unspecified: Secondary | ICD-10-CM | POA: Diagnosis present

## 2016-02-08 DIAGNOSIS — E870 Hyperosmolality and hypernatremia: Secondary | ICD-10-CM | POA: Diagnosis not present

## 2016-02-08 DIAGNOSIS — N3001 Acute cystitis with hematuria: Secondary | ICD-10-CM | POA: Diagnosis not present

## 2016-02-08 DIAGNOSIS — Z515 Encounter for palliative care: Secondary | ICD-10-CM | POA: Diagnosis not present

## 2016-02-08 DIAGNOSIS — N179 Acute kidney failure, unspecified: Secondary | ICD-10-CM | POA: Diagnosis not present

## 2016-02-08 DIAGNOSIS — N400 Enlarged prostate without lower urinary tract symptoms: Secondary | ICD-10-CM | POA: Diagnosis present

## 2016-02-08 DIAGNOSIS — T83518A Infection and inflammatory reaction due to other urinary catheter, initial encounter: Secondary | ICD-10-CM | POA: Diagnosis not present

## 2016-02-08 DIAGNOSIS — S72009A Fracture of unspecified part of neck of unspecified femur, initial encounter for closed fracture: Secondary | ICD-10-CM | POA: Diagnosis present

## 2016-02-08 DIAGNOSIS — Z87440 Personal history of urinary (tract) infections: Secondary | ICD-10-CM

## 2016-02-08 DIAGNOSIS — E785 Hyperlipidemia, unspecified: Secondary | ICD-10-CM | POA: Diagnosis present

## 2016-02-08 DIAGNOSIS — R509 Fever, unspecified: Secondary | ICD-10-CM

## 2016-02-08 DIAGNOSIS — F03918 Unspecified dementia, unspecified severity, with other behavioral disturbance: Secondary | ICD-10-CM | POA: Diagnosis present

## 2016-02-08 LAB — CBC
HEMATOCRIT: 28.6 % — AB (ref 39.0–52.0)
HEMOGLOBIN: 9.3 g/dL — AB (ref 13.0–17.0)
MCH: 24.7 pg — AB (ref 26.0–34.0)
MCHC: 32.5 g/dL (ref 30.0–36.0)
MCV: 75.9 fL — ABNORMAL LOW (ref 78.0–100.0)
Platelets: 351 10*3/uL (ref 150–400)
RBC: 3.77 MIL/uL — ABNORMAL LOW (ref 4.22–5.81)
RDW: 16.8 % — ABNORMAL HIGH (ref 11.5–15.5)
WBC: 20 10*3/uL — ABNORMAL HIGH (ref 4.0–10.5)

## 2016-02-08 LAB — URINALYSIS, ROUTINE W REFLEX MICROSCOPIC
Bilirubin Urine: NEGATIVE
Glucose, UA: NEGATIVE mg/dL
Ketones, ur: NEGATIVE mg/dL
Nitrite: NEGATIVE
SQUAMOUS EPITHELIAL / LPF: NONE SEEN
Specific Gravity, Urine: 1.013 (ref 1.005–1.030)
pH: 6 (ref 5.0–8.0)

## 2016-02-08 LAB — COMPREHENSIVE METABOLIC PANEL
ALBUMIN: 2.6 g/dL — AB (ref 3.5–5.0)
ALK PHOS: 87 U/L (ref 38–126)
ALT: 13 U/L — ABNORMAL LOW (ref 17–63)
ANION GAP: 11 (ref 5–15)
AST: 32 U/L (ref 15–41)
BUN: 48 mg/dL — ABNORMAL HIGH (ref 6–20)
CO2: 21 mmol/L — AB (ref 22–32)
Calcium: 9.1 mg/dL (ref 8.9–10.3)
Chloride: 105 mmol/L (ref 101–111)
Creatinine, Ser: 2.96 mg/dL — ABNORMAL HIGH (ref 0.61–1.24)
GFR calc Af Amer: 20 mL/min — ABNORMAL LOW (ref >60)
GFR calc non Af Amer: 18 mL/min — ABNORMAL LOW (ref >60)
GLUCOSE: 96 mg/dL (ref 65–99)
POTASSIUM: 3.3 mmol/L — AB (ref 3.5–5.1)
SODIUM: 137 mmol/L (ref 135–145)
Total Bilirubin: 0.3 mg/dL (ref 0.3–1.2)
Total Protein: 9.8 g/dL — ABNORMAL HIGH (ref 6.5–8.1)

## 2016-02-08 LAB — I-STAT CG4 LACTIC ACID, ED: LACTIC ACID, VENOUS: 2.89 mmol/L — AB (ref 0.5–1.9)

## 2016-02-08 MED ORDER — SODIUM CHLORIDE 0.9 % IV BOLUS (SEPSIS)
1800.0000 mL | Freq: Once | INTRAVENOUS | Status: AC
  Administered 2016-02-08: 1800 mL via INTRAVENOUS

## 2016-02-08 MED ORDER — DEXTROSE 5 % IV SOLN
2.0000 g | Freq: Once | INTRAVENOUS | Status: AC
  Administered 2016-02-08: 2 g via INTRAVENOUS
  Filled 2016-02-08: qty 2

## 2016-02-08 NOTE — ED Notes (Signed)
ATTEMPTED BLOOD DRAW

## 2016-02-08 NOTE — ED Provider Notes (Signed)
[facility]-EMERGENCY DEPT Provider Note   CSN: [id number] Arrival date & time: 02/08/16  1538     History   Chief Complaint Chief Complaint  Patient presents with  . Catheter Issue    HPI Jeremy [name] is a 83 y.o. male.  He presents for evaluation of hematuria associated with  anemia, and elevated white blood cell count, from his urology office. Seen and treated by them, but a week ago, with hematuria, which has persisted despite treatment with antibiotic, for infection. He has had a general gradual decline, according to his daughter who is here with him in getting the history. He's had decreased appetite, decreased ability to ambulate and overall fatigue, for several weeks. Daughter states that the patient has "dementia". The patient is unable to give any history. There are no other known modifying factors.  Level V caveat-dementia  HPI  Past Medical History:  Diagnosis Date  . CAD (coronary artery disease)   . Dementia   . HTN (hypertension)   . Hyperlipidemia   . Hypotension   . Hypothyroidism    s/p Thyroidectomy, partial  . Sepsis (HCC)   . Syncope    4/11 (hospitalized) and 4/13  . Syncope   . UTI (lower urinary tract infection)     Patient Active Problem List   Diagnosis Date Noted  . Hematuria 11/24/2015  . Acute blood loss anemia 11/24/2015  . Syncope 08/27/2015  . Hyperkalemia 08/26/2015  . Hypotension 08/25/2015  . Vasovagal near syncope 08/25/2015  . Nausea & vomiting 08/25/2015  . SIRS (systemic inflammatory response syndrome) (HCC) 06/08/2015  . Orthostatic hypotension 06/01/2015  . Confusion   . Urinary tract infectious disease   . UTI (urinary tract infection) due to urinary indwelling catheter (HCC) 04/19/2015  . Pressure ulcer 04/19/2015  . Syncope and collapse 04/15/2015  . Syncopal episodes 04/15/2015  . AKI (acute kidney injury) (HCC) 04/14/2015  . Abnormality of gait 02/25/2015  . Palliative care encounter 12/10/2014  . Protein-calorie  malnutrition, severe 12/06/2014  . Aspiration pneumonia of right lower lobe due to regurgitated food (HCC) 12/06/2014  . CKD (chronic kidney disease) stage 3, GFR 30-59 ml/min 12/06/2014  . Anemia of chronic renal failure, stage 3 (moderate) 12/06/2014  . Dyslipidemia 12/06/2014  . Coronary artery disease involving native coronary artery of native heart without angina pectoris 12/06/2014  . BPH (benign prostatic hyperplasia) 12/06/2014  . Chronic diastolic congestive heart failure (HCC) 12/06/2014  . Depression 12/06/2014  . HCAP (healthcare-associated pneumonia) 12/05/2014  . Dementia with behavioral disturbance 11/23/2014  . Acute encephalopathy 11/06/2014  . UTI (lower urinary tract infection) 11/06/2014  . Hypothyroidism, acquired 01/26/2014    Past Surgical History:  Procedure Laterality Date  . BACK SURGERY    . CARDIAC CATHETERIZATION  02/25/2002   Proximal L Circumflex 95% lesion, stented w/ 3x18-mm CYPHER stent avoiding the 2 L Marginal branch, jailing the 1st marginal, resulting in reduction of a 90-95% lesion to 0% residual  . CARDIOVASCULAR STRESS TEST  12/18/2006   EKG negative for ischemia, no significant ischemia demonstrated.  . CORONARY ANGIOPLASTY WITH STENT PLACEMENT    . RENAL DOPPLER  08/17/2005   Normal evaluation  . THYROIDECTOMY, PARTIAL    . TRANSTHORACIC ECHOCARDIOGRAM  03/13/2002   EF >55%, moderate LVH,        Home Medications    Prior to Admission medications   Medication Sig Start Date End Date Taking? Authorizing Provider  CRANBERRY PO Take 1 tablet by mouth every evening.  Historical Provider, MD  DOK 100 MG capsule Take 100 mg by mouth at bedtime. 10/08/15   Historical Provider, MD  dorzolamide (TRUSOPT) 2 % ophthalmic solution Place 1 drop into the right eye 2 (two) times daily.  08/23/12   Historical Provider, MD  finasteride (PROSCAR) 5 MG tablet Take 5 mg by mouth every evening.     Historical Provider, MD  iron polysaccharides (NIFEREX)  150 MG capsule Take 1 capsule (150 mg total) by mouth daily. 11/28/15   Vassie Loll, MD  latanoprost (XALATAN) 0.005 % ophthalmic solution Place 1 drop into both eyes at bedtime.    Historical Provider, MD  levothyroxine (SYNTHROID, LEVOTHROID) 75 MCG tablet Take 75 mcg by mouth daily before breakfast.  11/06/14   Historical Provider, MD  meloxicam (MOBIC) 15 MG tablet TK 1 T PO QD 11/26/15   Historical Provider, MD  midodrine (PROAMATINE) 5 MG tablet Take 1 tablet (5 mg total) by mouth 3 (three) times daily with meals. 11/02/15   Runell Gess, MD  OVER THE COUNTER MEDICATION Take 1 tablet by mouth every Monday, Wednesday, and Friday. With lunch.  Drop 1 tablet in water daily and drink- Over the counter [employer]    Historical Provider, MD  QUEtiapine (SEROQUEL) 25 MG tablet Take 1 tablet (25 mg total) by mouth at bedtime. For behaviors Patient taking differently: Take 25 mg by mouth at bedtime.  02/25/15   Levert Feinstein, MD  ranitidine (ZANTAC) 75 MG tablet Take 75 mg by mouth every morning. Reported on 06/08/2015    Historical Provider, MD  simvastatin (ZOCOR) 40 MG tablet Take 40 mg by mouth daily at 6 PM.     Historical Provider, MD  sulfamethoxazole-trimethoprim (BACTRIM DS,SEPTRA DS) 800-160 MG tablet Take 1 tablet by mouth 2 (two) times daily. 02/03/16   Historical Provider, MD  timolol (TIMOPTIC) 0.5 % ophthalmic solution Place 1 drop into both eyes 2 (two) times daily.  09/18/12   Historical Provider, MD  Vitamin D, Ergocalciferol, (DRISDOL) 50000 units CAPS capsule Take 50,000 Units by mouth 2 (two) times a week. Monday and Thursday in the evening    Historical Provider, MD    Family History Family History  Problem Relation Age of Onset  . Family history unknown: Yes    Social History Social History  Substance Use Topics  . Smoking status: Former Games developer  . Smokeless tobacco: Never Used     Comment: Quit 50+ years ago.  . Alcohol use No     Allergies   Tramadol   Review of  Systems Review of Systems  Unable to perform ROS: Dementia     Physical Exam Updated Vital Signs BP 137/84 (BP Location: Left Arm)   Pulse 87   Temp 98.4 F (36.9 C) (Oral)   Resp 16   Ht 6' (1.829 m)   Wt 133 lb (60.3 kg)   SpO2 96%   BMI 18.04 kg/m   Physical Exam  Constitutional: He appears well-developed.  Elderly, frail  HENT:  Head: Normocephalic and atraumatic.  Right Ear: External ear normal.  Left Ear: External ear normal.  Eyes: Conjunctivae and EOM are normal. Pupils are equal, round, and reactive to light.  Neck: Normal range of motion and phonation normal. Neck supple.  Cardiovascular: Normal rate, regular rhythm and normal heart sounds.   Pulmonary/Chest: Effort normal and breath sounds normal. He exhibits no bony tenderness.  Abdominal: Soft. There is no tenderness.  Genitourinary:  Genitourinary Comments: Fully catheter in penis. Urine  is blood-tinged. No visible clots in urine drainage tubing.  Musculoskeletal: Normal range of motion.  Neurological: He is alert. No cranial nerve deficit or sensory deficit. He exhibits normal muscle tone. Coordination normal.  Cooperative, follows commands. Does not speak during evaluation.  Skin: Skin is warm, dry and intact.  Psychiatric: He has a normal mood and affect. His behavior is normal.  Nursing note and vitals reviewed.    ED Treatments / Results  Labs (all labs ordered are listed, but only abnormal results are displayed) Labs Reviewed  CULTURE, BLOOD (ROUTINE X 2)  CULTURE, BLOOD (ROUTINE X 2)  COMPREHENSIVE METABOLIC PANEL  CBC  URINALYSIS, ROUTINE W REFLEX MICROSCOPIC  I-STAT CG4 LACTIC ACID, ED  TYPE AND SCREEN    EKG  EKG Interpretation None       Radiology No results found.  Procedures Procedures (including critical care time)  Medications Ordered in ED Medications - No data to display   Initial Impression / Assessment and Plan / ED Course  I have reviewed the triage vital signs  and the nursing notes.  Pertinent labs & imaging results that were available during my care of the patient were reviewed by me and considered in my medical decision making (see chart for details).  Clinical Course as of Feb 12 810  Tue Feb 08, 2016  2023 I was contacted by nursing who states that he has not had all of his labs drawn, yet.  [EW]  2028 I was able to obtain blood from the left femoral vein, with a single stick.  [EW]    Clinical Course User Index [EW] Mancel BaleElliott Arneshia Ade, MD    Medications - No data to display  Patient Vitals for the past 24 hrs:  BP Temp Temp src Pulse Resp SpO2 Height Weight  02/08/16 1607 - - - - - - 6' (1.829 m) 133 lb (60.3 kg)  02/08/16 1600 137/84 98.4 F (36.9 C) Oral 87 16 96 % - -    Consult- hospitalist to admit   Final Clinical Impressions(s) / ED Diagnoses   Final diagnoses:  Sepsis, due to unspecified organism [facility](HCC)  Acute cystitis with hematuria    Hematuria, with clots, and known urinary tract infection, currently under treatment. Screening for sepsis revealed elevated lactate, treated with high-volume saline boluses, with improvement. Stable vital signs in the emergency department. No sign for severe sepsis, or impending vascular collapse. Required admission for management.     Nursing Notes Reviewed/ Care Coordinated Applicable Imaging Reviewed Interpretation of Laboratory Data incorporated into ED treatment   Plan: Admit     New Prescriptions New Prescriptions   No medications on file     Mancel BaleElliott Alese Furniss, MD 02/13/16 [phone]

## 2016-02-08 NOTE — ED Notes (Signed)
Unsuccessful IV attempt x2. Charge RN asked to attempt.  

## 2016-02-08 NOTE — ED Notes (Signed)
RN is starting an IV line and drawing blood work 

## 2016-02-08 NOTE — ED Notes (Signed)
Main lab phlebotomy unsuccessful with lab draw attempt. MD made aware.

## 2016-02-08 NOTE — ED Notes (Signed)
Phlebotomy called for blood draw. °

## 2016-02-08 NOTE — ED Triage Notes (Signed)
Patient sent to ED by PCP due to hematuria and clots in catheter. Foley was irrigated till clear on 02/03/16. Per PCP, foley is draining clear dark cherry colored urine. Per PCP, patient's Hbg 8.3 & WBC 18.

## 2016-02-08 NOTE — ED Notes (Signed)
One set of blood cultures obtained by Dr. Effie ShyWentz sent at 20:52.

## 2016-02-09 ENCOUNTER — Inpatient Hospital Stay (HOSPITAL_COMMUNITY): Payer: PPO

## 2016-02-09 ENCOUNTER — Other Ambulatory Visit: Payer: Self-pay | Admitting: Urology

## 2016-02-09 DIAGNOSIS — E870 Hyperosmolality and hypernatremia: Secondary | ICD-10-CM | POA: Diagnosis not present

## 2016-02-09 DIAGNOSIS — Z66 Do not resuscitate: Secondary | ICD-10-CM | POA: Diagnosis not present

## 2016-02-09 DIAGNOSIS — N3001 Acute cystitis with hematuria: Secondary | ICD-10-CM | POA: Diagnosis not present

## 2016-02-09 DIAGNOSIS — D638 Anemia in other chronic diseases classified elsewhere: Secondary | ICD-10-CM | POA: Diagnosis not present

## 2016-02-09 DIAGNOSIS — T83511A Infection and inflammatory reaction due to indwelling urethral catheter, initial encounter: Secondary | ICD-10-CM

## 2016-02-09 DIAGNOSIS — I2510 Atherosclerotic heart disease of native coronary artery without angina pectoris: Secondary | ICD-10-CM | POA: Diagnosis not present

## 2016-02-09 DIAGNOSIS — N179 Acute kidney failure, unspecified: Secondary | ICD-10-CM

## 2016-02-09 DIAGNOSIS — E785 Hyperlipidemia, unspecified: Secondary | ICD-10-CM | POA: Diagnosis not present

## 2016-02-09 DIAGNOSIS — B952 Enterococcus as the cause of diseases classified elsewhere: Secondary | ICD-10-CM | POA: Diagnosis not present

## 2016-02-09 DIAGNOSIS — D509 Iron deficiency anemia, unspecified: Secondary | ICD-10-CM | POA: Diagnosis not present

## 2016-02-09 DIAGNOSIS — I951 Orthostatic hypotension: Secondary | ICD-10-CM | POA: Diagnosis not present

## 2016-02-09 DIAGNOSIS — N400 Enlarged prostate without lower urinary tract symptoms: Secondary | ICD-10-CM | POA: Diagnosis not present

## 2016-02-09 DIAGNOSIS — R600 Localized edema: Secondary | ICD-10-CM | POA: Diagnosis not present

## 2016-02-09 DIAGNOSIS — F0391 Unspecified dementia with behavioral disturbance: Secondary | ICD-10-CM | POA: Diagnosis not present

## 2016-02-09 DIAGNOSIS — S72009A Fracture of unspecified part of neck of unspecified femur, initial encounter for closed fracture: Secondary | ICD-10-CM | POA: Diagnosis present

## 2016-02-09 DIAGNOSIS — N390 Urinary tract infection, site not specified: Secondary | ICD-10-CM | POA: Diagnosis present

## 2016-02-09 DIAGNOSIS — Z515 Encounter for palliative care: Secondary | ICD-10-CM | POA: Diagnosis not present

## 2016-02-09 DIAGNOSIS — R310 Gross hematuria: Secondary | ICD-10-CM | POA: Diagnosis not present

## 2016-02-09 DIAGNOSIS — R339 Retention of urine, unspecified: Secondary | ICD-10-CM | POA: Diagnosis not present

## 2016-02-09 DIAGNOSIS — Y846 Urinary catheterization as the cause of abnormal reaction of the patient, or of later complication, without mention of misadventure at the time of the procedure: Secondary | ICD-10-CM | POA: Diagnosis not present

## 2016-02-09 DIAGNOSIS — T83518A Infection and inflammatory reaction due to other urinary catheter, initial encounter: Secondary | ICD-10-CM | POA: Diagnosis not present

## 2016-02-09 DIAGNOSIS — E890 Postprocedural hypothyroidism: Secondary | ICD-10-CM | POA: Diagnosis not present

## 2016-02-09 DIAGNOSIS — E43 Unspecified severe protein-calorie malnutrition: Secondary | ICD-10-CM | POA: Diagnosis not present

## 2016-02-09 DIAGNOSIS — R531 Weakness: Secondary | ICD-10-CM | POA: Diagnosis not present

## 2016-02-09 DIAGNOSIS — G9341 Metabolic encephalopathy: Secondary | ICD-10-CM | POA: Diagnosis not present

## 2016-02-09 DIAGNOSIS — R319 Hematuria, unspecified: Secondary | ICD-10-CM | POA: Diagnosis not present

## 2016-02-09 DIAGNOSIS — R1310 Dysphagia, unspecified: Secondary | ICD-10-CM | POA: Diagnosis not present

## 2016-02-09 DIAGNOSIS — N312 Flaccid neuropathic bladder, not elsewhere classified: Secondary | ICD-10-CM | POA: Diagnosis not present

## 2016-02-09 DIAGNOSIS — R338 Other retention of urine: Secondary | ICD-10-CM | POA: Diagnosis not present

## 2016-02-09 DIAGNOSIS — D649 Anemia, unspecified: Secondary | ICD-10-CM | POA: Diagnosis not present

## 2016-02-09 DIAGNOSIS — Z681 Body mass index (BMI) 19 or less, adult: Secondary | ICD-10-CM | POA: Diagnosis not present

## 2016-02-09 DIAGNOSIS — N1330 Unspecified hydronephrosis: Secondary | ICD-10-CM | POA: Diagnosis not present

## 2016-02-09 DIAGNOSIS — D62 Acute posthemorrhagic anemia: Secondary | ICD-10-CM | POA: Diagnosis not present

## 2016-02-09 LAB — CBC
HCT: 20.3 % — ABNORMAL LOW (ref 39.0–52.0)
Hemoglobin: 6.6 g/dL — CL (ref 13.0–17.0)
MCH: 24.4 pg — AB (ref 26.0–34.0)
MCHC: 32.5 g/dL (ref 30.0–36.0)
MCV: 75.2 fL — ABNORMAL LOW (ref 78.0–100.0)
Platelets: 408 10*3/uL — ABNORMAL HIGH (ref 150–400)
RBC: 2.7 MIL/uL — ABNORMAL LOW (ref 4.22–5.81)
RDW: 17.3 % — AB (ref 11.5–15.5)
WBC: 24.4 10*3/uL — ABNORMAL HIGH (ref 4.0–10.5)

## 2016-02-09 LAB — HEMOGLOBIN AND HEMATOCRIT, BLOOD
HCT: 36.8 % — ABNORMAL LOW (ref 39.0–52.0)
HEMATOCRIT: 29.9 % — AB (ref 39.0–52.0)
HEMOGLOBIN: 9.8 g/dL — AB (ref 13.0–17.0)
Hemoglobin: 12.5 g/dL — ABNORMAL LOW (ref 13.0–17.0)

## 2016-02-09 LAB — PROTIME-INR
INR: 1.4
Prothrombin Time: 17.3 seconds — ABNORMAL HIGH (ref 11.4–15.2)

## 2016-02-09 LAB — BASIC METABOLIC PANEL
ANION GAP: 11 (ref 5–15)
BUN: 41 mg/dL — AB (ref 6–20)
CHLORIDE: 112 mmol/L — AB (ref 101–111)
CO2: 18 mmol/L — ABNORMAL LOW (ref 22–32)
Calcium: 8.5 mg/dL — ABNORMAL LOW (ref 8.9–10.3)
Creatinine, Ser: 2.65 mg/dL — ABNORMAL HIGH (ref 0.61–1.24)
GFR calc Af Amer: 23 mL/min — ABNORMAL LOW (ref >60)
GFR calc non Af Amer: 20 mL/min — ABNORMAL LOW (ref >60)
GLUCOSE: 95 mg/dL (ref 65–99)
POTASSIUM: 3.5 mmol/L (ref 3.5–5.1)
Sodium: 141 mmol/L (ref 135–145)

## 2016-02-09 LAB — PREPARE RBC (CROSSMATCH)

## 2016-02-09 LAB — MRSA PCR SCREENING: MRSA by PCR: POSITIVE — AB

## 2016-02-09 LAB — I-STAT CG4 LACTIC ACID, ED: LACTIC ACID, VENOUS: 1.47 mmol/L (ref 0.5–1.9)

## 2016-02-09 MED ORDER — DOCUSATE SODIUM 100 MG PO CAPS
100.0000 mg | ORAL_CAPSULE | Freq: Every day | ORAL | Status: DC
  Filled 2016-02-09: qty 1

## 2016-02-09 MED ORDER — VANCOMYCIN HCL IN DEXTROSE 1-5 GM/200ML-% IV SOLN
1000.0000 mg | Freq: Once | INTRAVENOUS | Status: AC
  Administered 2016-02-09: 1000 mg via INTRAVENOUS
  Filled 2016-02-09: qty 200

## 2016-02-09 MED ORDER — QUETIAPINE FUMARATE 50 MG PO TABS
25.0000 mg | ORAL_TABLET | Freq: Every day | ORAL | Status: DC
  Administered 2016-02-09: 25 mg via ORAL
  Filled 2016-02-09: qty 1

## 2016-02-09 MED ORDER — VITAMIN D (ERGOCALCIFEROL) 1.25 MG (50000 UNIT) PO CAPS
50000.0000 [IU] | ORAL_CAPSULE | ORAL | Status: DC
  Administered 2016-02-10: 50000 [IU] via ORAL
  Filled 2016-02-09: qty 1

## 2016-02-09 MED ORDER — DORZOLAMIDE HCL 2 % OP SOLN
1.0000 [drp] | Freq: Two times a day (BID) | OPHTHALMIC | Status: DC
  Administered 2016-02-09 – 2016-02-13 (×9): 1 [drp] via OPHTHALMIC
  Filled 2016-02-09: qty 10

## 2016-02-09 MED ORDER — TIMOLOL MALEATE 0.5 % OP SOLN
1.0000 [drp] | Freq: Two times a day (BID) | OPHTHALMIC | Status: DC
  Administered 2016-02-09 – 2016-02-13 (×9): 1 [drp] via OPHTHALMIC
  Filled 2016-02-09: qty 5

## 2016-02-09 MED ORDER — SODIUM CHLORIDE 0.9 % IV SOLN
INTRAVENOUS | Status: DC
  Administered 2016-02-09 (×2): via INTRAVENOUS

## 2016-02-09 MED ORDER — VANCOMYCIN HCL 500 MG IV SOLR
500.0000 mg | INTRAVENOUS | Status: DC
  Administered 2016-02-11: 500 mg via INTRAVENOUS
  Filled 2016-02-09: qty 500

## 2016-02-09 MED ORDER — SODIUM CHLORIDE 0.9 % IV SOLN: Freq: Once | INTRAVENOUS | Status: DC

## 2016-02-09 MED ORDER — CHLORHEXIDINE GLUCONATE CLOTH 2 % EX PADS
6.0000 | MEDICATED_PAD | Freq: Every day | CUTANEOUS | Status: DC
  Administered 2016-02-10 – 2016-02-13 (×3): 6 via TOPICAL

## 2016-02-09 MED ORDER — FINASTERIDE 5 MG PO TABS
5.0000 mg | ORAL_TABLET | Freq: Every evening | ORAL | Status: DC
  Administered 2016-02-09: 5 mg via ORAL
  Filled 2016-02-09: qty 1

## 2016-02-09 MED ORDER — HYDRALAZINE HCL 20 MG/ML IJ SOLN
10.0000 mg | INTRAMUSCULAR | Status: DC | PRN
  Administered 2016-02-09 – 2016-02-12 (×4): 10 mg via INTRAVENOUS
  Filled 2016-02-09 (×4): qty 1

## 2016-02-09 MED ORDER — MIDODRINE HCL 5 MG PO TABS
5.0000 mg | ORAL_TABLET | Freq: Three times a day (TID) | ORAL | Status: DC
  Administered 2016-02-09 (×2): 5 mg via ORAL
  Filled 2016-02-09 (×2): qty 1

## 2016-02-09 MED ORDER — MUPIROCIN 2 % EX OINT
1.0000 "application " | TOPICAL_OINTMENT | Freq: Two times a day (BID) | CUTANEOUS | Status: DC
  Administered 2016-02-09 – 2016-02-13 (×8): 1 via NASAL
  Filled 2016-02-09: qty 22

## 2016-02-09 MED ORDER — FAMOTIDINE 20 MG PO TABS
10.0000 mg | ORAL_TABLET | Freq: Every day | ORAL | Status: DC
  Administered 2016-02-09 – 2016-02-10 (×2): 10 mg via ORAL
  Filled 2016-02-09 (×2): qty 1

## 2016-02-09 MED ORDER — SIMVASTATIN 40 MG PO TABS
40.0000 mg | ORAL_TABLET | Freq: Every day | ORAL | Status: DC
  Administered 2016-02-09: 40 mg via ORAL
  Filled 2016-02-09: qty 1

## 2016-02-09 MED ORDER — LATANOPROST 0.005 % OP SOLN
1.0000 [drp] | Freq: Every day | OPHTHALMIC | Status: DC
  Administered 2016-02-09 – 2016-02-12 (×4): 1 [drp] via OPHTHALMIC
  Filled 2016-02-09: qty 2.5

## 2016-02-09 MED ORDER — POLYSACCHARIDE IRON COMPLEX 150 MG PO CAPS
150.0000 mg | ORAL_CAPSULE | Freq: Every day | ORAL | Status: DC
  Administered 2016-02-09 – 2016-02-10 (×2): 150 mg via ORAL
  Filled 2016-02-09 (×2): qty 1

## 2016-02-09 MED ORDER — DEXTROSE 5 % IV SOLN
1.0000 g | INTRAVENOUS | Status: DC
  Administered 2016-02-09 – 2016-02-12 (×4): 1 g via INTRAVENOUS
  Filled 2016-02-09 (×4): qty 1

## 2016-02-09 NOTE — ED Notes (Signed)
Attempted to call ICU to start 20 min timer/confirm bed. Charge nurse not available.

## 2016-02-09 NOTE — Progress Notes (Signed)
Pharmacy Antibiotic Note  Jeremy [name] is a 83 y.o. male with hematuria and clots in catheter admitted on 02/08/2016 with UTI.  Pharmacy has been consulted for cefepime dosing. Hx of pseudomonas UTI 8/17.   PM: Adding vancomycin for Hx MRSA UTI (2x in 2017)  Plan: Continue Cefepime as ordered Vancomycin 1000 mg x 1 IV, then 500 mg IV q48 hr  Height: 6' (182.9 cm) Weight: 133 lb (60.3 kg) IBW/kg (Calculated) : 77.6  Temp (24hrs), Avg:97.9 F (36.6 C), Min:97.3 F (36.3 C), Max:98.4 F (36.9 C)   Recent Labs Lab 02/08/16 1800 02/08/16 1815 02/09/16 0027 02/09/16 0538 02/09/16 0613  WBC 20.0*  --   --  24.4*  --   CREATININE 2.96*  --   --   --  2.65*  LATICACIDVEN  --  2.89* 1.47  --   --     Estimated Creatinine Clearance: 16.4 mL/min (by C-G formula based on SCr of 2.65 mg/dL (H)).    Allergies  Allergen Reactions  . Tramadol Nausea And Vomiting    Antimicrobials this admission: 2/6 rocephin >> x1 ED 2/7 cefepime >>  2/7 vancomycin >>  Dose adjustments this admission:   Microbiology results: 2/7 BCx:  2/7 MRSA PCR: POS  Thank you for allowing pharmacy to be a part of this patient's care.  Bernadene Personrew Dawna Jakes, PharmD, BCPS Pager: [phone] 02/09/2016, 7:09 PM

## 2016-02-09 NOTE — Progress Notes (Signed)
Pharmacy Antibiotic Note  Jeremy [name] is a 83 y.o. male with hematuria and clots in catheter admitted on 02/08/2016 with UTI.  Pharmacy has been consulted for cefepime dosing. Hx of pseudomonas UTI 8/17.   Plan: Cefepime 1 Gm IV q24h  Height: 6' (182.9 cm) Weight: 133 lb (60.3 kg) IBW/kg (Calculated) : 77.6  Temp (24hrs), Avg:98.2 F (36.8 C), Min:98 F (36.7 C), Max:98.4 F (36.9 C)   Recent Labs Lab 02/08/16 1800 02/08/16 1815 02/09/16 0027  WBC 20.0*  --   --   CREATININE 2.96*  --   --   LATICACIDVEN  --  2.89* 1.47    Estimated Creatinine Clearance: 14.7 mL/min (by C-G formula based on SCr of 2.96 mg/dL (H)).    Allergies  Allergen Reactions  . Tramadol Nausea And Vomiting    Antimicrobials this admission: 2/6 rocephin >> x1 ED 2/7 cefepime >>   Dose adjustments this admission:   Microbiology results:  BCx:   UCx:    Sputum:    MRSA PCR:   Thank you for allowing pharmacy to be a part of this patient's care.  Jeremy [name], Jeremy [name] 02/09/2016 12:50 AM

## 2016-02-09 NOTE — H&P (Addendum)
History and Physical    Jeremy CoxJames T [name] [name]:[id number] DOB: December 02, 1925 DOA: 02/08/2016   PCP: Jeremy FeltsJanece [name] Chief Complaint:  Chief Complaint  Patient presents with  . Catheter Issue    HPI: Jeremy [name] is a 83 y.o. male with medical history significant of Advanced dementia, indwelling foley for past year, prior UTIs with pseudomonas last year.  Patient presents to the ED with c/o hematuria.  Symptoms onset about a week ago.  Persistent despite ABx treatment with bactrim.  Patient unable to provide any history due to advanced dementia.  ED Course: Patient with AKI, creat up to 2.8 from baseline 1.0.  UTI, WBC 20k, HGB 9.3 was 8.4 in Jan.  Review of Systems: Unable to perform due to advanced dementia.   Past Medical History:  Diagnosis Date  . CAD (coronary artery disease)   . Dementia   . HTN (hypertension)   . Hyperlipidemia   . Hypotension   . Hypothyroidism    s/p Thyroidectomy, partial  . Sepsis (HCC)   . Syncope    4/11 (hospitalized) and 4/13  . Syncope   . UTI (lower urinary tract infection)     Past Surgical History:  Procedure Laterality Date  . BACK SURGERY    . CARDIAC CATHETERIZATION  02/25/2002   Proximal L Circumflex 95% lesion, stented w/ 3x18-mm CYPHER stent avoiding the 2 L Marginal branch, jailing the 1st marginal, resulting in reduction of a 90-95% lesion to 0% residual  . CARDIOVASCULAR STRESS TEST  12/18/2006   EKG negative for ischemia, no significant ischemia demonstrated.  . CORONARY ANGIOPLASTY WITH STENT PLACEMENT    . RENAL DOPPLER  08/17/2005   Normal evaluation  . THYROIDECTOMY, PARTIAL    . TRANSTHORACIC ECHOCARDIOGRAM  03/13/2002   EF >55%, moderate LVH,      reports that he has quit smoking. He has never used smokeless tobacco. He reports that he does not drink alcohol or use drugs.  Allergies  Allergen Reactions  . Tramadol Nausea And Vomiting    Family History  Problem Relation Age of Onset  . Family history unknown: Yes       Prior to Admission medications   Medication Sig Start Date End Date Taking? Authorizing Provider  CRANBERRY PO Take 1 tablet by mouth every evening.    Yes Historical Provider, MD  DOK 100 MG capsule Take 100 mg by mouth at bedtime. 10/08/15  Yes Historical Provider, MD  dorzolamide (TRUSOPT) 2 % ophthalmic solution Place 1 drop into the right eye 2 (two) times daily.  08/23/12  Yes Historical Provider, MD  finasteride (PROSCAR) 5 MG tablet Take 5 mg by mouth every evening.    Yes Historical Provider, MD  iron polysaccharides (NIFEREX) 150 MG capsule Take 1 capsule (150 mg total) by mouth daily. 11/28/15  Yes Vassie Lollarlos Madera, MD  latanoprost (XALATAN) 0.005 % ophthalmic solution Place 1 drop into both eyes at bedtime.   Yes Historical Provider, MD  midodrine (PROAMATINE) 5 MG tablet Take 1 tablet (5 mg total) by mouth 3 (three) times daily with meals. 11/02/15  Yes Runell GessJonathan J Berry, MD  QUEtiapine (SEROQUEL) 25 MG tablet Take 1 tablet (25 mg total) by mouth at bedtime. For behaviors Patient taking differently: Take 25 mg by mouth at bedtime.  02/25/15  Yes Levert FeinsteinYijun Yan, MD  ranitidine (ZANTAC) 75 MG tablet Take 75 mg by mouth every morning. Reported on 06/08/2015   Yes Historical Provider, MD  simvastatin (ZOCOR) 40 MG tablet Take 40 mg by  mouth daily at 6 PM.    Yes Historical Provider, MD  timolol (TIMOPTIC) 0.5 % ophthalmic solution Place 1 drop into both eyes 2 (two) times daily.  09/18/12  Yes Historical Provider, MD  Vitamin D, Ergocalciferol, (DRISDOL) 50000 units CAPS capsule Take 50,000 Units by mouth 2 (two) times a week. Monday and Thursday in the evening   Yes Historical Provider, MD  OVER THE COUNTER MEDICATION Take 1 tablet by mouth every Monday, Wednesday, and Friday. With lunch.  Drop 1 tablet in water daily and drink- Over the counter [employer]    Historical Provider, MD    Physical Exam: Vitals:   02/08/16 2330 02/08/16 2350 02/09/16 0000 02/09/16 0030  BP: 171/93 171/93 174/95  171/94  Pulse:  98    Resp:  20    Temp:      TempSrc:      SpO2:  96%    Weight:      Height:          Constitutional: NAD, calm, comfortable Eyes: PERRL, lids and conjunctivae normal ENMT: Mucous membranes are moist. Posterior pharynx clear of any exudate or lesions.Normal dentition.  Neck: normal, supple, no masses, no thyromegaly Respiratory: clear to auscultation bilaterally, no wheezing, no crackles. Normal respiratory effort. No accessory muscle use.  Cardiovascular: Regular rate and rhythm, no murmurs / rubs / gallops. No extremity edema. 2+ pedal pulses. No carotid bruits.  Abdomen: no tenderness, no masses palpated. No hepatosplenomegaly. Bowel sounds positive.  Musculoskeletal: no clubbing / cyanosis. No joint deformity upper and lower extremities. Good ROM, no contractures. Normal muscle tone.  Skin: no rashes, lesions, ulcers. No induration Neurologic: MAE, purposeful movements, nonverbal and demented Psychiatric: Nonverbal, demented   Labs on Admission: I have personally reviewed following labs and imaging studies  CBC:  Recent Labs Lab 02/08/16 1800  WBC 20.0*  HGB 9.3*  HCT 28.6*  MCV 75.9*  PLT 351   Basic Metabolic Panel:  Recent Labs Lab 02/08/16 1800  NA 137  K 3.3*  CL 105  CO2 21*  GLUCOSE 96  BUN 48*  CREATININE 2.96*  CALCIUM 9.1   GFR: Estimated Creatinine Clearance: 14.7 mL/min (by C-G formula based on SCr of 2.96 mg/dL (H)). Liver Function Tests:  Recent Labs Lab 02/08/16 1800  AST 32  ALT 13*  ALKPHOS 87  BILITOT 0.3  PROT 9.8*  ALBUMIN 2.6*   No results for input(s): LIPASE, AMYLASE in the last 168 hours. No results for input(s): AMMONIA in the last 168 hours. Coagulation Profile: No results for input(s): INR, PROTIME in the last 168 hours. Cardiac Enzymes: No results for input(s): CKTOTAL, CKMB, CKMBINDEX, TROPONINI in the last 168 hours. BNP (last 3 results) No results for input(s): PROBNP in the last 8760  hours. HbA1C: No results for input(s): HGBA1C in the last 72 hours. CBG: No results for input(s): GLUCAP in the last 168 hours. Lipid Profile: No results for input(s): CHOL, HDL, LDLCALC, TRIG, CHOLHDL, LDLDIRECT in the last 72 hours. Thyroid Function Tests: No results for input(s): TSH, T4TOTAL, FREET4, T3FREE, THYROIDAB in the last 72 hours. Anemia Panel: No results for input(s): VITAMINB12, FOLATE, FERRITIN, TIBC, IRON, RETICCTPCT in the last 72 hours. Urine analysis:    Component Value Date/Time   COLORURINE RED (A) 02/08/2016 1614   APPEARANCEUR CLOUDY (A) 02/08/2016 1614   APPEARANCEUR Cloudy (A) 01/06/2016 1618   LABSPEC 1.013 02/08/2016 1614   PHURINE 6.0 02/08/2016 1614   GLUCOSEU NEGATIVE 02/08/2016 1614   HGBUR LARGE (  A) 02/08/2016 1614   BILIRUBINUR NEGATIVE 02/08/2016 1614   BILIRUBINUR Negative 01/06/2016 1618   KETONESUR NEGATIVE 02/08/2016 1614   PROTEINUR >=300 (A) 02/08/2016 1614   UROBILINOGEN 0.2 11/08/2014 0334   NITRITE NEGATIVE 02/08/2016 1614   LEUKOCYTESUR LARGE (A) 02/08/2016 1614   LEUKOCYTESUR 3+ (A) 01/06/2016 1618   Sepsis Labs: @LABRCNTIP (procalcitonin:4,lacticidven:4) )No results found for this or any previous visit (from the past 240 hour(s)).   Radiological Exams on Admission: No results found.  EKG: Independently reviewed.  Assessment/Plan Principal Problem:   Hematuria Active Problems:   Dementia with behavioral disturbance   AKI (acute kidney injury) (HCC)   Orthostatic hypotension   Catheter-associated urinary tract infection (HCC)    1. Hematuria and CAUTI - 1. Cefepime 2. Culture pending 3. Repeat CBC in AM 2. AKI - 1. Presumably due to retention 2. IVF 3. Retention resolved / improved after foley clot mobilized (large amount of UOP) 4. Monitor UOP 5. Renal US 6. BS shows just 100cc still in bladder 7. Repeat BMP in AM 3. Dementia - 1. Continue home meds 2. Advanced at baseline 4. Chronic hypotension  - 1. Continue midodrine 2. I see that he was on Florinef back in November according to DC summary but I dont see where he is still on this according to med rec today.   DVT prophylaxis: SCDs Code Status: Full per family (probably a good point to get Pal care involved in near future due to advanced dementia) Family Communication: Son-in-law at bedside, daughter on phone Consults called: None Admission status: Admit to inpatient   Hillary Bow DO [facility] Pager [phone] from 7PM-7AM  If 7AM-7PM, please contact the day physician for the patient [email or web address] Password [email or web address]  02/09/2016, 1:25 AM

## 2016-02-09 NOTE — Progress Notes (Addendum)
HGB dropped to 6.6 with AM labs and US shows R sided hydronephrosis and thickened bladder wall.  1) 2 unit PRBC transfusion ordered for acute blood loss anemia 2) checking stat INR but dont really have a reason for this to be elevated (he isnt on blood thinners) 3) Consulting Urology now to see if they have any additional recommendations.  But I think they will just see pure "red" if they try to cystoscopy a this point.  Dr. Marlou PorchHerrick on way to see patient: going to start CBI.  Regarding the hydronephrosis: at this point he just recommends checking serial BMPs and seeing if AKF resolves or not.  I suspect patient would need IR perc nephrostomy tube if any intervention were to be done at this point.  Again I dont see how they could perform cystoscopy given the blood in urine which makes it opaque.

## 2016-02-09 NOTE — ED Notes (Signed)
Prior shift was unsuccessful at obtaining T&S despite multiple efforts.  For the comfort of this pt the T&S will be obtained with next blood draw at 5am in order to avoid any unnecesary discomfort from additional sticks.  Discussed this with phlebotomy.  VSS at this time

## 2016-02-09 NOTE — ED Notes (Signed)
Foley continues to drain bloody urine. Person at bedside states pt has been resting

## 2016-02-09 NOTE — ED Notes (Signed)
Unable to collect labs patient has not been transfused

## 2016-02-09 NOTE — Progress Notes (Addendum)
PROGRESS NOTE  Jeremy [name]  [name]:[id number] DOB: 1925-02-20 DOA: 02/08/2016 PCP: [name]  Brief Narrative:  Jeremy [name] is a 83 y.o. male with medical history significant of Advanced dementia, indwelling foley for past year, prior UTIs with pseudomonas last year.  Patient presented to the ED with hematuria.  Symptoms onset about a week prior to admission and did not improve with outpatient bactrim.  Hemoglobin dropped from 9.3 to 6.6 g/dL in the emergency department. He was seen by urology who started continuous bladder irrigation. He has been started on transfusion of 2 units PRBCs and is being admitted to the stepdown unit for further management.  Assessment & Plan:   Principal Problem:   Hematuria Active Problems:   Dementia with behavioral disturbance   AKI (acute kidney injury) (HCC)   Orthostatic hypotension   Catheter-associated urinary tract infection (HCC)  Acute blood loss anemia secondary to hematuria possibly related to catheter associated urinary tract infection due to indwelling foley catheter present at time of admission, however he may have some other underlying bladder etiology -  Continuous bladder irrigation -  Empiric cefepime for urinary tract infection -  Follow-up urine culture -  2 units PRBCs -  Follow-up hemoglobin -  Transfuse to keep hemoglobin greater than 7-8 g/dL -  Anticipate cystoscopy on Friday -  Check iron level, folate, b12, TSH in AM  Acute kidney injury, baseline creatinine of 1, 2.96 admission -  Creatinine trending down with bladder irrigation  Acute metabolic encephalopathy superimposed on dementia, mentation improving with IV fluids, antibiotics, and blood transfusion  Chronic hypotension -  Continue midodrine -  was on Florinef back in November according to DC summary but no longer on his medication list  Leukocytosis, may be secondary to urinary tract infection versus acute anemia -  Repeat CBC in a.m. -  Antibiotics as  above  DVT prophylaxis:  SCDs Code Status:  Full code Family Communication:  Daughter [name] at bedside Disposition Plan:  Possible discharge over the weekend after cystoscopy.  Stable hemoglobin.  Tolerating oral medications/diet.   Consultants:   Urology, Dr. Marlou Porch  Procedures:  Initiation of CBI on 2/7  Antimicrobials:  Anti-infectives    Start     Dose/Rate Route Frequency Ordered Stop   02/09/16 0600  ceFEPIme (MAXIPIME) 1 g in dextrose 5 % 50 mL IVPB     1 g 100 mL/hr over 30 Minutes Intravenous Every 24 hours 02/09/16 0255     02/08/16 2100  cefTRIAXone (ROCEPHIN) 2 g in dextrose 5 % 50 mL IVPB     2 g 100 mL/hr over 30 Minutes Intravenous  Once 02/08/16 2054 02/08/16 2153       Subjective:  Confused. Denies headache, chest pain, abdominal pain, difficulty breathing, nausea  Objective: Vitals:   02/09/16 1200 02/09/16 1200 02/09/16 1242 02/09/16 1300  BP: 140/83 140/83 (!) 153/82 (!) 163/84  Pulse:  79  (P) 77  Resp:  15 15 (P) 13  Temp:   98 F (36.7 C) (P) 97.9 F (36.6 C)  TempSrc:   Oral (P) Oral  SpO2:  100% 97% (P) 92%  Weight:      Height:        Intake/Output Summary (Last 24 hours) at 02/09/16 1317 Last data filed at 02/09/16 1300  Gross per 24 hour  Intake           3923.5 ml  Output  1550 ml  Net           2373.5 ml   Filed Weights   02/08/16 1607  Weight: 60.3 kg (133 lb)    Examination:  General exam:  Adult Male.  No acute distress.  HEENT:  NCAT, MMM Respiratory system: Clear to auscultation bilaterally Cardiovascular system: Regular rate and rhythm, normal S1/S2. 2/6 systolic murmur, rubs, gallops or clicks.  Warm extremities Gastrointestinal system: Normal active bowel sounds, soft, nondistended, nontender. MSK:  Normal tone and bulk, no lower extremity edema Neuro:  Grossly moves all extremities    Data Reviewed: I have personally reviewed following labs and imaging studies  CBC:  Recent Labs Lab  02/08/16 1800 02/09/16 0538  WBC 20.0* 24.4*  HGB 9.3* 6.6*  HCT 28.6* 20.3*  MCV 75.9* 75.2*  PLT 351 408*   Basic Metabolic Panel:  Recent Labs Lab 02/08/16 1800 02/09/16 0613  NA 137 141  K 3.3* 3.5  CL 105 112*  CO2 21* 18*  GLUCOSE 96 95  BUN 48* 41*  CREATININE 2.96* 2.65*  CALCIUM 9.1 8.5*   GFR: Estimated Creatinine Clearance: 16.4 mL/min (by C-G formula based on SCr of 2.65 mg/dL (H)). Liver Function Tests:  Recent Labs Lab 02/08/16 1800  AST 32  ALT 13*  ALKPHOS 87  BILITOT 0.3  PROT 9.8*  ALBUMIN 2.6*   No results for input(s): LIPASE, AMYLASE in the last 168 hours. No results for input(s): AMMONIA in the last 168 hours. Coagulation Profile:  Recent Labs Lab 02/09/16 0625  INR 1.40   Cardiac Enzymes: No results for input(s): CKTOTAL, CKMB, CKMBINDEX, TROPONINI in the last 168 hours. BNP (last 3 results) No results for input(s): PROBNP in the last 8760 hours. HbA1C: No results for input(s): HGBA1C in the last 72 hours. CBG: No results for input(s): GLUCAP in the last 168 hours. Lipid Profile: No results for input(s): CHOL, HDL, LDLCALC, TRIG, CHOLHDL, LDLDIRECT in the last 72 hours. Thyroid Function Tests: No results for input(s): TSH, T4TOTAL, FREET4, T3FREE, THYROIDAB in the last 72 hours. Anemia Panel: No results for input(s): VITAMINB12, FOLATE, FERRITIN, TIBC, IRON, RETICCTPCT in the last 72 hours. Urine analysis:    Component Value Date/Time   COLORURINE RED (A) 02/08/2016 1614   APPEARANCEUR CLOUDY (A) 02/08/2016 1614   APPEARANCEUR Cloudy (A) 01/06/2016 1618   LABSPEC 1.013 02/08/2016 1614   PHURINE 6.0 02/08/2016 1614   GLUCOSEU NEGATIVE 02/08/2016 1614   HGBUR LARGE (A) 02/08/2016 1614   BILIRUBINUR NEGATIVE 02/08/2016 1614   BILIRUBINUR Negative 01/06/2016 1618   KETONESUR NEGATIVE 02/08/2016 1614   PROTEINUR >=300 (A) 02/08/2016 1614   UROBILINOGEN 0.2 11/08/2014 0334   NITRITE NEGATIVE 02/08/2016 1614   LEUKOCYTESUR  LARGE (A) 02/08/2016 1614   LEUKOCYTESUR 3+ (A) 01/06/2016 1618   Sepsis Labs: @LABRCNTIP (procalcitonin:4,lacticidven:4)  )No results found for this or any previous visit (from the past 240 hour(s)).    Radiology Studies: US Renal  Result Date: 02/09/2016 CLINICAL DATA:  Hematuria with clots in catheter. Elevated BUN and creatinine. EXAM: RENAL / URINARY TRACT ULTRASOUND COMPLETE COMPARISON:  CT abdomen and pelvis 12/21/2015 FINDINGS: Right Kidney: Length: 11.5 cm. Moderate hydronephrosis and hydroureter. The right ureter can be followed down to the level of the bladder and no stones are identified. Renal parenchymal echotexture is normal. No focal mass lesions identified. Left Kidney: Length: 11.8 cm. Echogenicity within normal limits. No mass or hydronephrosis visualized. Bladder: There is a Foley catheter in the bladder. The bladder wall around the  Foley catheter appears to be extremely thickened. This could represent marked bladder wall thickening versus mass or adherent hemorrhagic products. The bladder was also thickened and abnormal on prior CT scan. IMPRESSION: Right-sided hydronephrosis and hydroureter. Foley catheter deflects the bladder but the bladder wall is markedly thickened, possibly indicating tumor infiltration, inflammatory reaction, or adherent thrombus. Electronically Signed   By: Burman NievesWilliam  Stevens M.D.   On: 02/09/2016 02:26     Scheduled Meds: . sodium chloride   Intravenous Once  . ceFEPime (MAXIPIME) IV  1 g Intravenous Q24H  . docusate sodium  100 mg Oral QHS  . dorzolamide  1 drop Right Eye BID  . famotidine  10 mg Oral Daily  . finasteride  5 mg Oral QPM  . iron polysaccharides  150 mg Oral Daily  . latanoprost  1 drop Both Eyes QHS  . midodrine  5 mg Oral TID WC  . QUEtiapine  25 mg Oral QHS  . simvastatin  40 mg Oral q1800  . timolol  1 drop Both Eyes BID  . [START ON 02/10/2016] Vitamin D (Ergocalciferol)  50,000 Units Oral Once per day on Mon Thu    Continuous Infusions: . sodium chloride 125 mL/hr at 02/09/16 1230     LOS: 0 days    Time spent: 30 min    Renae FickleSHORT, Nary Sneed, MD [facility] Pager [phone]  If 7PM-7AM, please contact night-coverage [email or web address] Password [email or web address] 02/09/2016, 1:17 PM

## 2016-02-09 NOTE — ED Notes (Signed)
Checked on pt.  Noted only minimal red urine in the foley bag and no urine in the tubing.  Moved tubing around and squeezed it in order to get urine to flow.  Was able to observe a blood clot move through catheter tubing followed by red urine which became lighter over time.  Emptied 700cc from urine bag after continuing to move tubing to allow continued bladder emptying.  After 700cc bladder scan done which showed 153cc.  Admitting MD came to bedside.

## 2016-02-09 NOTE — ED Notes (Signed)
Await antibiotic from pharmacy.  Gave family member something to drink.  Pt continues to have dark red urine draining from foley.  Await further orders

## 2016-02-09 NOTE — Consult Note (Signed)
I have been asked to see the patient by Dr. Lyda Perone, DO, for evaluation and management of gross hematuria, anemia and right hydronephrosis.  History of present illness: 83 year old African-American Jeremy [name] who presented to the emergency department yesterday with gross hematuria and elevated white blood cell count. He has a history of neurogenic bladder requiring Foley catheter. He has had hematuria in the past which was presumed to be due to prostatic bleeding and infection.    Overnight, the patient's hematuria has persisted although his Foley catheter has been draining. His hemoglobin was noted to drop from 9.3 to 6.6. Renal ultrasound was obtained which demonstrated new hydronephrosis. In addition, the patient's renal function is significantly declined, his baseline creatinine is 1.0 and today's creatinine was 2.96. The patient has not had any fevers or chills. He has been hemodynamically stable.  The patient does have an omental dementia and is a poor historian, no review of systems was able to be obtained.   Patient Active Problem List   Diagnosis Date Noted  . Catheter-associated urinary tract infection (HCC) 02/09/2016  . Hematuria 11/24/2015  . Acute blood loss anemia 11/24/2015  . Syncope 08/27/2015  . Hyperkalemia 08/26/2015  . Hypotension 08/25/2015  . Vasovagal near syncope 08/25/2015  . Nausea & vomiting 08/25/2015  . SIRS (systemic inflammatory response syndrome) (HCC) 06/08/2015  . Orthostatic hypotension 06/01/2015  . Confusion   . Urinary tract infectious disease   . Pressure ulcer 04/19/2015  . Syncope and collapse 04/15/2015  . Syncopal episodes 04/15/2015  . AKI (acute kidney injury) (HCC) 04/14/2015  . Abnormality of gait 02/25/2015  . Palliative care encounter 12/10/2014  . Protein-calorie malnutrition, severe 12/06/2014  . Aspiration pneumonia of right lower lobe due to regurgitated food (HCC) 12/06/2014  . CKD (chronic kidney disease) stage 3, GFR 30-59  ml/min 12/06/2014  . Anemia of chronic renal failure, stage 3 (moderate) 12/06/2014  . Dyslipidemia 12/06/2014  . Coronary artery disease involving native coronary artery of native heart without angina pectoris 12/06/2014  . BPH (benign prostatic hyperplasia) 12/06/2014  . Chronic diastolic congestive heart failure (HCC) 12/06/2014  . Depression 12/06/2014  . HCAP (healthcare-associated pneumonia) 12/05/2014  . Dementia with behavioral disturbance 11/23/2014  . Acute encephalopathy 11/06/2014  . UTI (lower urinary tract infection) 11/06/2014  . Hypothyroidism, acquired 01/26/2014    No current facility-administered medications on file prior to encounter.    Current Outpatient Prescriptions on File Prior to Encounter  Medication Sig Dispense Refill  . CRANBERRY PO Take 1 tablet by mouth every evening.     Marland Kitchen DOK 100 MG capsule Take 100 mg by mouth at bedtime.  5  . dorzolamide (TRUSOPT) 2 % ophthalmic solution Place 1 drop into the right eye 2 (two) times daily.     . finasteride (PROSCAR) 5 MG tablet Take 5 mg by mouth every evening.     . iron polysaccharides (NIFEREX) 150 MG capsule Take 1 capsule (150 mg total) by mouth daily. 30 capsule 1  . latanoprost (XALATAN) 0.005 % ophthalmic solution Place 1 drop into both eyes at bedtime.    . midodrine (PROAMATINE) 5 MG tablet Take 1 tablet (5 mg total) by mouth 3 (three) times daily with meals. 270 tablet 3  . QUEtiapine (SEROQUEL) 25 MG tablet Take 1 tablet (25 mg total) by mouth at bedtime. For behaviors (Patient taking differently: Take 25 mg by mouth at bedtime. ) 90 tablet 3  . ranitidine (ZANTAC) 75 MG tablet Take 75 mg by mouth every morning.  Reported on 06/08/2015    . simvastatin (ZOCOR) 40 MG tablet Take 40 mg by mouth daily at 6 PM.     . timolol (TIMOPTIC) 0.5 % ophthalmic solution Place 1 drop into both eyes 2 (two) times daily.     . Vitamin D, Ergocalciferol, (DRISDOL) 50000 units CAPS capsule Take 50,000 Units by mouth 2 (two)  times a week. Monday and Thursday in the evening    . OVER THE COUNTER MEDICATION Take 1 tablet by mouth every Monday, Wednesday, and Friday. With lunch.  Drop 1 tablet in water daily and drink- Over the counter [employer]      Past Medical History:  Diagnosis Date  . CAD (coronary artery disease)   . Dementia   . HTN (hypertension)   . Hyperlipidemia   . Hypotension   . Hypothyroidism    s/p Thyroidectomy, partial  . Sepsis (HCC)   . Syncope    4/11 (hospitalized) and 4/13  . Syncope   . UTI (lower urinary tract infection)     Past Surgical History:  Procedure Laterality Date  . BACK SURGERY    . CARDIAC CATHETERIZATION  02/25/2002   Proximal L Circumflex 95% lesion, stented w/ 3x18-mm CYPHER stent avoiding the 2 L Marginal branch, jailing the 1st marginal, resulting in reduction of a 90-95% lesion to 0% residual  . CARDIOVASCULAR STRESS TEST  12/18/2006   EKG negative for ischemia, no significant ischemia demonstrated.  . CORONARY ANGIOPLASTY WITH STENT PLACEMENT    . RENAL DOPPLER  08/17/2005   Normal evaluation  . THYROIDECTOMY, PARTIAL    . TRANSTHORACIC ECHOCARDIOGRAM  03/13/2002   EF >55%, moderate LVH,     Social History  Substance Use Topics  . Smoking status: Former Games developer  . Smokeless tobacco: Never Used     Comment: Quit 50+ years ago.  . Alcohol use No    Family History  Problem Relation Age of Onset  . Family history unknown: Yes    PE: Vitals:   02/09/16 0500 02/09/16 0530 02/09/16 0622 02/09/16 0700  BP: 151/82 144/85  152/79  Pulse: 95 94  92  Resp:    18  Temp:   98.1 F (36.7 C)   TempSrc:   Oral   SpO2: 100% 100%  100%  Weight:      Height:       Patient appears to be in no acute distress  patient is alert, He is disoriented Atraumatic normocephalic head No cervical or supraclavicular lymphadenopathy appreciated No increased work of breathing, no audible wheezes/rhonchi Regular sinus rhythm/rate Abdomen is soft, nontender,  nondistended The patient's urethra has eroded down to the mid shaft of his penis. Lower extremities are symmetric without appreciable edema Grossly neurologically intact No identifiable skin lesions   Recent Labs  02/08/16 1800 02/09/16 0538  WBC 20.0* 24.4*  HGB 9.3* 6.6*  HCT 28.6* 20.3*    Recent Labs  02/08/16 1800 02/09/16 0613  NA 137 141  K 3.3* 3.5  CL 105 112*  CO2 21* 18*  GLUCOSE 96 95  BUN 48* 41*  CREATININE 2.96* 2.65*  CALCIUM 9.1 8.5*    Recent Labs  02/09/16 0625  INR 1.40   No results for input(s): LABURIN in the last 72 hours. Results for orders placed or performed in visit on 01/06/16  Microscopic Examination     Status: Abnormal   Collection Time: 01/06/16  4:18 PM  Result Value Ref Range Status   WBC, UA >30 (A) 0 -  5 /hpf Final   RBC, UA 11-30 (A) 0 - 2 /hpf Final   Epithelial Cells (non renal) 0-10 0 - 10 /hpf Final   Casts None seen None seen /lpf Final   Bacteria, UA Few None seen/Few Final    Imaging: I have reviewed the patient's images on the most recent renal ultrasound which demonstrates new right-sided hydronephrosis with a collapsed bladder.  Procedure: The patient's 7416 French indwelling Foley was removed and using routine sterile protocol a 22 French three-way Foley catheter was inserted into the patient's bladder atraumatically. 20 mL of sterile water was used to inflate the balloon. The catheter was then irrigated with normal saline and a few small blood clots were removed. The catheter irrigated clear quickly. He was then placed on continuous bladder irrigation at a slow drip.  Imp: 83 year old Jeremy [name] with history of a neurogenic bladder managed with an indwelling Foley and recurrent urinary tract infections who is admitted for gross hematuria, anemia, and acute renal failure with new right-sided hydronephrosis. The patient's new onset hydronephrosis concerning for obstruction within the bladder. Cystoscopy in June 2017  demonstrated erythema without any significant or unusual findings. However, this should be reevaluated given the new findings. The hematuria is likely infectious related with associated irritation and trauma from the Foley catheter although there may be a more concerning etiology in his bladder. His anemia is likely secondary to chronic disease as well as his recent episode of gross hematuria.  Recommendations: Continuous bladder irrigation has been initiated, this could be weaned to off. Urine cultures are pending, broad-spectrum antibiotics to be continued until culture data returns in the antibiotics to be narrowed. Would recommend transfusing the patient to get his hemoglobin above 7. We will plan to allow the patient to allow the patient to recover and treat his infection and anemia today and tomorrow. We will plan to proceed to the operating room on Friday for cystoscopy, possible bladder biopsy, bilateral retrograde pyelogram and potential right-sided ureteroscopy and right ureteral stent placement. This will be performed by Dr. Mena GoesEskridge, his primary urologist.  We will continue to follow the patient closely.  Berniece SalinesHERRICK, Nasiyah Laverdiere W

## 2016-02-09 NOTE — Progress Notes (Signed)
Will also change patient bed request to SDU.

## 2016-02-10 ENCOUNTER — Encounter (HOSPITAL_COMMUNITY): Payer: Self-pay | Admitting: Certified Registered Nurse Anesthetist

## 2016-02-10 ENCOUNTER — Inpatient Hospital Stay (HOSPITAL_COMMUNITY): Payer: PPO

## 2016-02-10 ENCOUNTER — Encounter (HOSPITAL_COMMUNITY): Payer: Self-pay | Admitting: Radiology

## 2016-02-10 LAB — MAGNESIUM: Magnesium: 1.6 mg/dL — ABNORMAL LOW (ref 1.7–2.4)

## 2016-02-10 LAB — CBC
HCT: 32.1 % — ABNORMAL LOW (ref 39.0–52.0)
HEMOGLOBIN: 10.7 g/dL — AB (ref 13.0–17.0)
MCH: 26 pg (ref 26.0–34.0)
MCHC: 33.3 g/dL (ref 30.0–36.0)
MCV: 78.1 fL (ref 78.0–100.0)
PLATELETS: 277 10*3/uL (ref 150–400)
RBC: 4.11 MIL/uL — AB (ref 4.22–5.81)
RDW: 15.9 % — ABNORMAL HIGH (ref 11.5–15.5)
WBC: 23.9 10*3/uL — AB (ref 4.0–10.5)

## 2016-02-10 LAB — TYPE AND SCREEN
ABO/RH(D): O POS
Antibody Screen: NEGATIVE
UNIT DIVISION: 0
UNIT DIVISION: 0

## 2016-02-10 LAB — BASIC METABOLIC PANEL
Anion gap: 9 (ref 5–15)
BUN: 37 mg/dL — ABNORMAL HIGH (ref 6–20)
CHLORIDE: 115 mmol/L — AB (ref 101–111)
CO2: 18 mmol/L — ABNORMAL LOW (ref 22–32)
CREATININE: 2.25 mg/dL — AB (ref 0.61–1.24)
Calcium: 8.3 mg/dL — ABNORMAL LOW (ref 8.9–10.3)
GFR calc non Af Amer: 24 mL/min — ABNORMAL LOW (ref >60)
GFR, EST AFRICAN AMERICAN: 28 mL/min — AB (ref >60)
Glucose, Bld: 106 mg/dL — ABNORMAL HIGH (ref 65–99)
Potassium: 3 mmol/L — ABNORMAL LOW (ref 3.5–5.1)
SODIUM: 142 mmol/L (ref 135–145)

## 2016-02-10 LAB — IRON AND TIBC
IRON: 27 ug/dL — AB (ref 45–182)
SATURATION RATIOS: 16 % — AB (ref 17.9–39.5)
TIBC: 164 ug/dL — AB (ref 250–450)
UIBC: 137 ug/dL

## 2016-02-10 LAB — T4, FREE: Free T4: 0.83 ng/dL (ref 0.61–1.12)

## 2016-02-10 LAB — VITAMIN B12: Vitamin B-12: 491 pg/mL (ref 180–914)

## 2016-02-10 LAB — FERRITIN: Ferritin: 438 ng/mL — ABNORMAL HIGH (ref 24–336)

## 2016-02-10 LAB — FOLATE: Folate: 20.5 ng/mL (ref >5.9)

## 2016-02-10 LAB — TSH: TSH: 8.573 u[IU]/mL — ABNORMAL HIGH (ref 0.350–4.500)

## 2016-02-10 MED ORDER — BOOST / RESOURCE BREEZE PO LIQD: 1.0000 | Freq: Three times a day (TID) | ORAL | Status: DC

## 2016-02-10 MED ORDER — POTASSIUM CHLORIDE CRYS ER 20 MEQ PO TBCR
20.0000 meq | EXTENDED_RELEASE_TABLET | Freq: Two times a day (BID) | ORAL | Status: DC
  Administered 2016-02-10: 20 meq via ORAL
  Filled 2016-02-10: qty 1

## 2016-02-10 MED ORDER — SODIUM CHLORIDE 0.9 % IV SOLN
30.0000 meq | Freq: Once | INTRAVENOUS | Status: AC
  Administered 2016-02-10: 30 meq via INTRAVENOUS
  Filled 2016-02-10: qty 15

## 2016-02-10 MED ORDER — KCL IN DEXTROSE-NACL 20-5-0.45 MEQ/L-%-% IV SOLN
INTRAVENOUS | Status: DC
  Administered 2016-02-10: 13:00:00 via INTRAVENOUS
  Filled 2016-02-10 (×2): qty 1000

## 2016-02-10 MED ORDER — BISACODYL 10 MG RE SUPP: 10.0000 mg | Freq: Every day | RECTAL | Status: DC | PRN

## 2016-02-10 MED ORDER — LIP MEDEX EX OINT
TOPICAL_OINTMENT | CUTANEOUS | Status: AC
  Administered 2016-02-10: 21:00:00
  Filled 2016-02-10: qty 7

## 2016-02-10 MED ORDER — SODIUM CHLORIDE 0.9 % IV SOLN
10.0000 mg | INTRAVENOUS | Status: DC
  Administered 2016-02-10 – 2016-02-12 (×3): 10 mg via INTRAVENOUS
  Filled 2016-02-10 (×6): qty 1

## 2016-02-10 NOTE — Evaluation (Signed)
Clinical/Bedside Swallow Evaluation Patient Details  Name: Jeremy [name] MRN: [id number] Date of Birth: December 01, 1925  Today's Date: 02/10/2016 Time: SLP Start Time (ACUTE ONLY): 1038 SLP Stop Time (ACUTE ONLY): 1058 SLP Time Calculation (min) (ACUTE ONLY): 20 min  Past Medical History:  Past Medical History:  Diagnosis Date  . CAD (coronary artery disease)   . Dementia   . HTN (hypertension)   . Hyperlipidemia   . Hypotension   . Hypothyroidism    s/p Thyroidectomy, partial  . Sepsis (HCC)   . Syncope    4/11 (hospitalized) and 4/13  . Syncope   . UTI (lower urinary tract infection)    Past Surgical History:  Past Surgical History:  Procedure Laterality Date  . BACK SURGERY    . CARDIAC CATHETERIZATION  02/25/2002   Proximal L Circumflex 95% lesion, stented w/ 3x18-mm CYPHER stent avoiding the 2 L Marginal branch, jailing the 1st marginal, resulting in reduction of a 90-95% lesion to 0% residual  . CARDIOVASCULAR STRESS TEST  12/18/2006   EKG negative for ischemia, no significant ischemia demonstrated.  . CORONARY ANGIOPLASTY WITH STENT PLACEMENT    . RENAL DOPPLER  08/17/2005   Normal evaluation  . THYROIDECTOMY, PARTIAL    . TRANSTHORACIC ECHOCARDIOGRAM  03/13/2002   EF >55%, moderate LVH,    HPI:  83 y.o.malewith medical history significant of Advanced dementia, indwelling foley for past year, prior UTIs with pseudomonas last year. Patient presentedto the ED with hematuria. Symptoms onset about a week prior to admission and did not improve with outpatient bactrim. Hemoglobin dropped from 9.3 to 6.6 g/dL in the emergency department. Pt had previously been assessed by SLP 2x in 2016, both recommending regular diet and thin liquids.   Assessment / Plan / Recommendation Clinical Impression  Pt has significant oral holding with thin liquids despite Max multimodal cueing, ultimately letting the entire bolus spill anteriorly from his oral cavity. No pharyngeal swallow was  elicited. [name] reports that he had still been eating a regular diet and thin liquids PTA, but that he his mentation and swallowing function declined within the past week with the onset of his hematuria. Hopeful that with medical management pt may be able to resume PO intake, but for now recommend NPO. Pt could try single ice chips or very small amounts of water if he is accepting and swallowing (oral suction should be set up in room as a precaution). Paged MD - may wish to consider meds by alternative means. Will f/u for PO readiness.    Aspiration Risk  Moderate aspiration risk;Mild aspiration risk    Diet Recommendation NPO;Ice chips PRN after oral care   Medication Administration: Via alternative means    Other  Recommendations Oral Care Recommendations: Oral care QID;Oral care prior to ice chip/H20 Other Recommendations: Have oral suction available   Follow up Recommendations  (tba)      Frequency and Duration min 2x/week  2 weeks       Prognosis Prognosis for Safe Diet Advancement: Fair Barriers to Reach Goals: Cognitive deficits      Swallow Study   General HPI: 83 y.o.malewith medical history significant of Advanced dementia, indwelling foley for past year, prior UTIs with pseudomonas last year. Patient presentedto the ED with hematuria. Symptoms onset about a week prior to admission and did not improve with outpatient bactrim. Hemoglobin dropped from 9.3 to 6.6 g/dL in the emergency department. Pt had previously been assessed by SLP 2x in 2016, both recommending regular diet  and thin liquids. Type of Study: Bedside Swallow Evaluation Previous Swallow Assessment: see HPI Diet Prior to this Study: Dysphagia 1 (puree);Thin liquids Temperature Spikes Noted: No Respiratory Status: Room air History of Recent Intubation: No Behavior/Cognition: Alert;Cooperative;Requires cueing Oral Cavity Assessment: Within Functional Limits Oral Care Completed by SLP: No Oral  Cavity - Dentition: Missing dentition Vision: Impaired for self-feeding Self-Feeding Abilities: Needs assist Patient Positioning: Upright in bed Baseline Vocal Quality: Normal (minimal observed, mostly says "mhmm") Volitional Swallow: Unable to elicit    Oral/Motor/Sensory Function     Ice Chips Ice chips: Not tested   Thin Liquid Thin Liquid: Impaired Presentation: Straw Oral Phase Functional Implications: Oral holding;Right anterior spillage Pharyngeal  Phase Impairments: Other (comments) (not elicited)    Nectar Thick Nectar Thick Liquid: Not tested   Honey Thick Honey Thick Liquid: Not tested   Puree Puree: Not tested   Solid   GO   Solid: Not tested        Maxcine Ham 02/10/2016,11:18 AM  Maxcine Ham, M.A. CCC-SLP [phone]

## 2016-02-10 NOTE — Progress Notes (Signed)
Initial Nutrition Assessment  DOCUMENTATION CODES:   Severe malnutrition in context of acute illness/injury, Underweight  INTERVENTION:  - RD will monitor for needs and provide warranted interventions at follow-up. - Diet advancement as medically feasible.   NUTRITION DIAGNOSIS:   Inadequate oral intake related to inability to eat as evidenced by NPO status  GOAL:   Patient will meet greater than or equal to 90% of their needs  MONITOR:   Diet advancement, Weight trends, Labs, Skin, I & O's  REASON FOR ASSESSMENT:   Consult  ASSESSMENT:   83 y.o. male with medical history significant of Advanced dementia, indwelling foley for past year, prior UTIs with pseudomonas last year.  Patient presents to the ED with c/o hematuria.  Symptoms onset about a week ago.  Persistent despite ABx treatment with bactrim.   Pt seen for consult. BMI indicates underweight status. Pt was on Dysphagia 1 with thin liquids from 0800-1120 when diet was changed to NPO per SLP recommendation; SLP note from this AM reviewed. No intakes during time that pt was on a diet. Spoke with family members at bedside. 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 For lunch yesterday pt consumed potato soup and for dinner he had corn, green beans, and a few bites of balsalmic chicken (Pt was on a Regular diet yesterday).   Pt moved in with his daughter at the beginning of December 2017 and had a good appetite at that time; example of eating bacon and eggs at breakfast. But over the past 2 months he has become weaker, especially in the past 1 week and breakfast became a small bowl of cereal. Pt is able to self-feed at baseline, especially if it is something he can hold such as a piece of toast. With getting weaker he has been unable to feed himself. He has had a poor appetite and decreased desire for food and drinks. He was beginning to have difficulties with swallowing  liquids PTA and yesterday family noticed some difficulty in swallowing foods.   Unable to perform physical assessment as RN and tech were helping pt become more comfortable in bed; did not want to bother him once they had adjusted him and his covers. Able to visualize wasting throughout the upper body which appears severe in nature. Daughter reports that pt weighed ~143 lbs when he moved in with her and that over the past 2 months he has lost weight d/t decreased intakes. Based on CB, this indicates 10 lb weight loss (7% body weight) in the past 2 months which is significant for time frame.   Pt to go for cystoscopy and possible bladder biopsy tomorrow. Will monitor for POC/GOC and provide nutrition-related interventions as warranted.   Medications reviewed; PRN Dulcolax, 10 mg IV Pepcid/day, 30 mEq IV KCl x1 run today Labs reviewed; K: 3 mmol/L, Cl: 115 mmol/L, BUN: 37 mg/dL, creatinine: 1.612.25 mg/dL, Ca: 8.3 mg/dL, Mg: 1.6 mg/dL, GFR: 28 mL/min.  IVF: D5-1/2 NS-20 mEq KCl @ 75 mL/hr (306 kcal).    Diet Order:  Diet NPO time specified Except for: Ice Chips  Skin:  Wound (see comment) (Stage 2 sacral pressure injury)  Last BM:  PTA/unknown  Height:   Ht Readings from Last 1 Encounters:  02/08/16 6' (1.829 m)    Weight:   Wt Readings from Last 1 Encounters:  02/08/16 133 lb (60.3 kg)    Ideal Body Weight:  80.91 kg  BMI:  Body  mass index is 18.04 kg/m.  Estimated Nutritional Needs:   Kcal:  1325-1570 (22-26 kcal/kg)  Protein:  55-65 grams  Fluid:  >/= 1.6 L/day  EDUCATION NEEDS:   No education needs identified at this time    Trenton Gammon, MS, RD, LDN, CNSC Inpatient Clinical Dietitian Pager # [phone] After hours/weekend pager # [phone]

## 2016-02-10 NOTE — Progress Notes (Signed)
Patient is not responding to command of taking medicine nor food/water. Patient is pocketing food and medications crushed in applesauce. Had to sweep food out of mouth due to him not swallowing. Daughter at bedside. Will continue to monitor patient.

## 2016-02-10 NOTE — Progress Notes (Signed)
Assessment/Plan:  1) gross hematuria - clearing. I irrigated catheter. DDx includes infection/inflammation or neoplasm. Consider cysto, RG's, poss TURP poss TUR tomorrow, although seeing him today he is quite frail and might not survive an anesthetic.It may be he's not eating/drinking enough at home and the resultant poor UOP is not keeping his bladder and foley flushed out allowing bacterial overgrowth.  2) right hydronephrosis - this is new and may be from clot retention, stone or neoplasm. Check non-con CT today. If normal, we may consider canceling case tomorrow.  3) urinary retention - continue foley - he has a known large capacity, hypotonic bladder on urodynamics.   Subjective: Pt sleeping but arousable.   Objective: Vital signs in last 24 hours: Temp:  [97.3 F (36.3 C)-98.6 F (37 C)] 97.9 F (36.6 C) (02/08 0800) Pulse Rate:  [77-98] 96 (02/08 0600) Resp:  [12-23] 14 (02/08 0600) BP: (127-186)/(70-91) 146/81 (02/08 0600) SpO2:  [92 %-100 %] 100 % (02/08 0600)  Intake/Output from previous day: 02/07 0701 - 02/08 0700 In: 7715.8 [I.V.:3325.8; Blood:1340; IV Piggyback:50] Out: 5000 [Urine:5000] Intake/Output this shift: No intake/output data recorded.  Physical Exam:  Sleepy but arousable. No complaints.  Abd - soft, NT Foley in place - CBI running but sluggish.  I irrigated foley and flushed a small amount of clot, tissue and debris. Urine clear and CBI running normally.   Lab Results:  Recent Labs  02/09/16 1252 02/09/16 1919 02/10/16 0342  HGB 9.8* 12.5* 10.7*  HCT 29.9* 36.8* 32.1*   BMET  Recent Labs  02/09/16 0613 02/10/16 0342  NA 141 142  K 3.5 3.0*  CL 112* 115*  CO2 18* 18*  GLUCOSE 95 106*  BUN 41* 37*  CREATININE 2.65* 2.25*  CALCIUM 8.5* 8.3*    Recent Labs  02/09/16 0625  INR 1.40   No results for input(s): LABURIN in the last 72 hours. Results for orders placed or performed during the hospital encounter of 02/08/16   Culture, blood (routine x 2)     Status: None (Preliminary result)   Collection Time: 02/08/16  8:49 PM  Result Value Ref Range Status   Specimen Description BLOOD FEMORAL  Final   Special Requests BOTTLES DRAWN AEROBIC AND ANAEROBIC 5ML  Final   Culture   Final    NO GROWTH < 24 HOURS Performed at [facility], [address] [place], [place] [address]    Report Status PENDING  Incomplete  MRSA PCR Screening     Status: Abnormal   Collection Time: 02/09/16 12:30 PM  Result Value Ref Range Status   MRSA by PCR POSITIVE (A) NEGATIVE Final    Comment:        The GeneXpert MRSA Assay (FDA approved for NASAL specimens only), is one component of a comprehensive MRSA colonization surveillance program. It is not intended to diagnose MRSA infection nor to guide or monitor treatment for MRSA infections. RESULT CALLED TO, READ BACK BY AND VERIFIED WITH: Z.SUGGS RN AT 1848 ON 02/09/16 BY S.VANHOORNE     Studies/Results: [place] Renal  Result Date: 02/09/2016 CLINICAL DATA:  Hematuria with clots in catheter. Elevated BUN and creatinine. EXAM: RENAL / URINARY TRACT ULTRASOUND COMPLETE COMPARISON:  CT abdomen and pelvis 12/21/2015 FINDINGS: Right Kidney: Length: 11.5 cm. Moderate hydronephrosis and hydroureter. The right ureter can be followed down to the level of the bladder and no stones are identified. Renal parenchymal echotexture is normal. No focal mass lesions identified. Left Kidney: Length: 11.8 cm. Echogenicity  within normal limits. No mass or hydronephrosis visualized. Bladder: There is a Foley catheter in the bladder. The bladder wall around the Foley catheter appears to be extremely thickened. This could represent marked bladder wall thickening versus mass or adherent hemorrhagic products. The bladder was also thickened and abnormal on prior CT scan. IMPRESSION: Right-sided hydronephrosis and hydroureter. Foley catheter deflects the bladder but the bladder wall is markedly thickened,  possibly indicating tumor infiltration, inflammatory reaction, or adherent thrombus. Electronically Signed   By: Burman Nieves M.D.   On: 02/09/2016 02:26       LOS: 1 day   Eimy Plaza 02/10/2016, 9:27 AM

## 2016-02-10 NOTE — Progress Notes (Signed)
PROGRESS NOTE  Jeremy [name]  KGM:[id number] DOB: December 11, 1925 DOA: 02/08/2016 PCP: [name]  Brief Narrative:  Jeremy [name] is a 83 y.o. male with medical history significant of advanced dementia, indwelling foley for past year, prior UTIs with pseudomonas last year.  Patient presented to the ED with hematuria.  Symptoms onset about a week prior to admission and did not improve with outpatient bactrim.  Hemoglobin dropped from 9.3 to 6.6 g/dL in the emergency department. He was seen by urology who started continuous bladder irrigation. He was transfused 2 units of packed red blood cells with an expected rise in hemoglobin. He continues to have poor oral intake and was pocketing food this morning.  His poor eating started approximately 1-2 weeks ago according to his daughter who was present at bedside and this may be related to his urinary tract infection. If he does not get better with antibiotics, this may be a sign that his dementia has progressed, in which case, we should explore goals of care with the family before pursuing any procedures for his hematuria.  Assessment & Plan:   Principal Problem:   Hematuria Active Problems:   Dementia with behavioral disturbance   AKI (acute kidney injury) (HCC)   Orthostatic hypotension   Catheter-associated urinary tract infection (HCC)   Acute cystitis with hematuria  Acute blood loss anemia secondary to hematuria possibly related to catheter associated urinary tract infection due to indwelling foley catheter present at time of admission, however he may have some other underlying bladder etiology -  Continuous bladder irrigation -  Empiric cefepime and vancomycin for urinary tract infection -  Urine culture not obtained without 24 hours of urinalysis and therefore could not be added on -  Urine culture ordered today -  Blood cultures NGTD -  2 units PRBCs given on 02/09/2016 -  Transfuse to keep hemoglobin greater than 7-8 g/dL -  Possible  cystoscopy if he perks up -  Iron level low, but ferritin elevated (sign of malignancy?), folate and B12 wnl -  TSH elevated (could be sick euthyroid) -  Check fT4  Dysphagia, pocketing food -  SLP assistance appreciated -  Changed medications to IV where possible -  Start IVF  Acute kidney injury, baseline creatinine of 1, 2.96 admission -  Creatinine trending down with bladder irrigation  Acute metabolic encephalopathy superimposed on dementia, mentation improving with IV fluids, antibiotics, and blood transfusion  Chronic hypotension but BP currently elevated -  D/c  midodrine -  was on Florinef back in November according to DC summary but no longer on his medication list -  Start hydralazine prn  Leukocytosis, may be secondary to urinary tract infection versus acute anemia -  Repeat CBC in a.m. -  Antibiotics as above  DVT prophylaxis:  SCDs Code Status:  Full code Family Communication:  Daughter [name] at bedside Disposition Plan:  Not able to eat and drink currently.  Bleeding has slowed and hemoglobin relatively stable for now. Need to discuss GOC with family.     Consultants:   Urology, Dr. Marlou Porch  Procedures:  Initiation of CBI on 2/7  Antimicrobials:  Anti-infectives    Start     Dose/Rate Route Frequency Ordered Stop   02/11/16 1600  vancomycin (VANCOCIN) 500 mg in sodium chloride 0.9 % 100 mL IVPB     500 mg 100 mL/hr over 60 Minutes Intravenous Every 48 hours 02/09/16 1603     02/09/16 1700  vancomycin (VANCOCIN) IVPB 1000 mg/200  mL premix     1,000 mg 200 mL/hr over 60 Minutes Intravenous  Once 02/09/16 1603 02/09/16 1847   02/09/16 0600  ceFEPIme (MAXIPIME) 1 g in dextrose 5 % 50 mL IVPB     1 g 100 mL/hr over 30 Minutes Intravenous Every 24 hours 02/09/16 0255     02/08/16 2100  cefTRIAXone (ROCEPHIN) 2 g in dextrose 5 % 50 mL IVPB     2 g 100 mL/hr over 30 Minutes Intravenous  Once 02/08/16 2054 02/08/16 2153       Subjective:  Confused.  Denies headache, chest pain, abdominal pain, difficulty breathing, nausea  Objective: Vitals:   02/10/16 0900 02/10/16 1000 02/10/16 1100 02/10/16 1232  BP: 139/78 (!) 154/81 (!) 170/89   Pulse: 84  89   Resp: 16 18 16    Temp:    98 F (36.7 C)  TempSrc:    Oral  SpO2: 100%  100%   Weight:      Height:        Intake/Output Summary (Last 24 hours) at 02/10/16 1253 Last data filed at 02/10/16 0900  Gross per 24 hour  Intake          7392.33 ml  Output             5350 ml  Net          2042.33 ml   Filed Weights   02/08/16 1607  Weight: 60.3 kg (133 lb)    Examination:  General exam:  Adult Male.  No acute distress.  Sitting upright in bed but difficulty staying awake.  Pocketed food all night and not swallowing food this morning while his daughter is trying to feed him HEENT:  NCAT, MMM Respiratory system: Clear to auscultation bilaterally Cardiovascular system: Regular rate and rhythm, normal S1/S2. 2/6 systolic murmur, rubs, gallops or clicks.  Warm extremities Gastrointestinal system: Normal active bowel sounds, soft, nondistended, nontender. MSK:  Normal tone and bulk, no lower extremity edema Neuro:  Grossly moves all extremities GU:  Light pinkish tinge to bladder irrigation    Data Reviewed: I have personally reviewed following labs and imaging studies  CBC:  Recent Labs Lab 02/08/16 1800 02/09/16 0538 02/09/16 1252 02/09/16 1919 02/10/16 0342  WBC 20.0* 24.4*  --   --  23.9*  HGB 9.3* 6.6* 9.8* 12.5* 10.7*  HCT 28.6* 20.3* 29.9* 36.8* 32.1*  MCV 75.9* 75.2*  --   --  78.1  PLT 351 408*  --   --  277   Basic Metabolic Panel:  Recent Labs Lab 02/08/16 1800 02/09/16 0613 02/10/16 0342  NA 137 141 142  K 3.3* 3.5 3.0*  CL 105 112* 115*  CO2 21* 18* 18*  GLUCOSE 96 95 106*  BUN 48* 41* 37*  CREATININE 2.96* 2.65* 2.25*  CALCIUM 9.1 8.5* 8.3*  MG  --   --  1.6*   GFR: Estimated Creatinine Clearance: 19.4 mL/min (by C-G formula based on SCr of  2.25 mg/dL (H)). Liver Function Tests:  Recent Labs Lab 02/08/16 1800  AST 32  ALT 13*  ALKPHOS 87  BILITOT 0.3  PROT 9.8*  ALBUMIN 2.6*   No results for input(s): LIPASE, AMYLASE in the last 168 hours. No results for input(s): AMMONIA in the last 168 hours. Coagulation Profile:  Recent Labs Lab 02/09/16 0625  INR 1.40   Cardiac Enzymes: No results for input(s): CKTOTAL, CKMB, CKMBINDEX, TROPONINI in the last 168 hours. BNP (last 3 results) No results for  input(s): PROBNP in the last 8760 hours. HbA1C: No results for input(s): HGBA1C in the last 72 hours. CBG: No results for input(s): GLUCAP in the last 168 hours. Lipid Profile: No results for input(s): CHOL, HDL, LDLCALC, TRIG, CHOLHDL, LDLDIRECT in the last 72 hours. Thyroid Function Tests:  Recent Labs  02/10/16 0342  TSH 8.573*  FREET4 0.83   Anemia Panel:  Recent Labs  02/10/16 0342  VITAMINB12 491  FOLATE 20.5  FERRITIN 438*  TIBC 164*  IRON 27*   Urine analysis:    Component Value Date/Time   COLORURINE RED (A) 02/08/2016 1614   APPEARANCEUR CLOUDY (A) 02/08/2016 1614   APPEARANCEUR Cloudy (A) 01/06/2016 1618   LABSPEC 1.013 02/08/2016 1614   PHURINE 6.0 02/08/2016 1614   GLUCOSEU NEGATIVE 02/08/2016 1614   HGBUR LARGE (A) 02/08/2016 1614   BILIRUBINUR NEGATIVE 02/08/2016 1614   BILIRUBINUR Negative 01/06/2016 1618   KETONESUR NEGATIVE 02/08/2016 1614   PROTEINUR >=300 (A) 02/08/2016 1614   UROBILINOGEN 0.2 11/08/2014 0334   NITRITE NEGATIVE 02/08/2016 1614   LEUKOCYTESUR LARGE (A) 02/08/2016 1614   LEUKOCYTESUR 3+ (A) 01/06/2016 1618   Sepsis Labs: @LABRCNTIP (procalcitonin:4,lacticidven:4)  ) Recent Results (from the past 240 hour(s))  Culture, blood (routine x 2)     Status: None (Preliminary result)   Collection Time: 02/08/16  5:45 PM  Result Value Ref Range Status   Specimen Description BLOOD LEFT FOREARM  Final   Special Requests BOTTLES DRAWN AEROBIC AND ANAEROBIC  Final    Culture   Final    NO GROWTH 1 DAY Performed at [facility], [address] [place], [place] [address]    Report Status PENDING  Incomplete  Culture, blood (routine x 2)     Status: None (Preliminary result)   Collection Time: 02/08/16  8:49 PM  Result Value Ref Range Status   Specimen Description BLOOD FEMORAL ARTERY  Final   Special Requests BOTTLES DRAWN AEROBIC AND ANAEROBIC  Final   Culture   Final    NO GROWTH 2 DAYS Performed at [facility], [address], [place], [place] [address]    Report Status PENDING  Incomplete  MRSA PCR Screening     Status: Abnormal   Collection Time: 02/09/16 12:30 PM  Result Value Ref Range Status   MRSA by PCR POSITIVE (A) NEGATIVE Final    Comment:        The GeneXpert MRSA Assay (FDA approved for NASAL specimens only), is one component of a comprehensive MRSA colonization surveillance program. It is not intended to diagnose MRSA infection nor to guide or monitor treatment for MRSA infections. RESULT CALLED TO, READ BACK BY AND VERIFIED WITH: Z.SUGGS RN AT 1848 ON 02/09/16 BY S.VANHOORNE       Radiology Studies: Ct Abdomen Pelvis Wo Contrast  Result Date: 02/10/2016 CLINICAL DATA:  Gross hematuria. EXAM: CT ABDOMEN AND PELVIS WITHOUT CONTRAST TECHNIQUE: Multidetector CT imaging of the abdomen and pelvis was performed following the standard protocol without IV contrast. COMPARISON:  CT scan 12/21/2015 FINDINGS: Lower chest: Small left pleural effusion with overlying atelectasis. Right basilar atelectasis is also noted. Coronary artery stent is noted. No pericardial effusion. Hepatobiliary: No focal hepatic lesions or intrahepatic biliary dilatation. Examination is somewhat limited by streak artifact from the patient's arms. Pancreas: No mass, inflammation or ductal dilatation but exam is limited. Spleen: Normal size.  No focal lesions. Adrenals/Urinary Tract: Persistent bilateral hydroureteronephrosis down to a 8 massively  thick walled bladder which  contains a Foley catheter. No discrete mass. On a prior head CT from 10/11/2014 the bladder was markedly dilated but not thick walled. Recommend correlation with cystoscopy. Stomach/Bowel: No obvious bowel abnormality. Moderate stool noted in the rectum. Vascular/Lymphatic: Amazingly little aortic calcifications for age. No abdominal or pelvic lymphadenopathy is identified. Reproductive: The prostate gland and seminal vesicles are grossly normal. Other: Small amount of free abdominal/ pelvic fluid and mild body wall edema. No inguinal mass or adenopathy. Musculoskeletal: No significant bony findings. Remote avulsion fracture involving the lesser trochanter of the right hip. IMPRESSION: 1. Massively thick walled bladder persists. No discrete mass. Recommend correlation with cystoscopy. 2. Persistent bilateral hydroureteronephrosis. 3. Mild mesenteric edema, peritoneal fluid and body wall edema. 4. Small left effusion and bibasilar atelectasis. 5. No abdominal/pelvic lymphadenopathy. Electronically Signed   By: Rudie MeyerP.  Gallerani M.D.   On: 02/10/2016 11:04   [place] Renal  Result Date: 02/09/2016 CLINICAL DATA:  Hematuria with clots in catheter. Elevated BUN and creatinine. EXAM: RENAL / URINARY TRACT ULTRASOUND COMPLETE COMPARISON:  CT abdomen and pelvis 12/21/2015 FINDINGS: Right Kidney: Length: 11.5 cm. Moderate hydronephrosis and hydroureter. The right ureter can be followed down to the level of the bladder and no stones are identified. Renal parenchymal echotexture is normal. No focal mass lesions identified. Left Kidney: Length: 11.8 cm. Echogenicity within normal limits. No mass or hydronephrosis visualized. Bladder: There is a Foley catheter in the bladder. The bladder wall around the Foley catheter appears to be extremely thickened. This could represent marked bladder wall thickening versus mass or adherent hemorrhagic products. The bladder was also thickened and abnormal on prior CT  scan. IMPRESSION: Right-sided hydronephrosis and hydroureter. Foley catheter deflects the bladder but the bladder wall is markedly thickened, possibly indicating tumor infiltration, inflammatory reaction, or adherent thrombus. Electronically Signed   By: Burman NievesWilliam  Stevens M.D.   On: 02/09/2016 02:26     Scheduled Meds: . sodium chloride   Intravenous Once  . ceFEPime (MAXIPIME) IV  1 g Intravenous Q24H  . Chlorhexidine Gluconate Cloth  6 each Topical Q0600  . dorzolamide  1 drop Right Eye BID  . famotidine (PEPCID) IV  10 mg Intravenous Q24H  . latanoprost  1 drop Both Eyes QHS  . mupirocin ointment  1 application Nasal BID  . potassium chloride (KCL MULTIRUN) 30 mEq in 265 mL IVPB  30 mEq Intravenous Once  . timolol  1 drop Both Eyes BID  . [START ON 02/11/2016] vancomycin  500 mg Intravenous Q48H   Continuous Infusions:    LOS: 1 day    Time spent: 30 min    Renae FickleSHORT, Sherilynn Dieu, MD [facility] Pager [phone]  If 7PM-7AM, please contact night-coverage [email or web address] Password [email or web address] 02/10/2016, 12:53 PM

## 2016-02-10 NOTE — Care Management Note (Signed)
Case Management Note  Patient Details  Name: Jeremy [name] MRN: [id number] Date of Birth: 21-Sep-1925  Subjective/Objective:       Hematuria, sepsis, dementia             Action/Plan:Date:  February 09, 2016 Chart reviewed for concurrent status and case management needs. Will continue to follow patient progress. Discharge Planning: following for needs Expected discharge date: [id number] Jeremy [name], BSN, [place] RoadRN3, [place]   [phone]    Expected Discharge Date:   (unknown)               Expected Discharge Plan:  Home w Home Health Services  In-House Referral:  Clinical Social Work  Discharge planning Services  CM Consult  Post Acute Care Choice:    Choice offered to:     DME Arranged:    DME Agency:     HH Arranged:    HH Agency:     Status of Service:  In process, will continue to follow  If discussed at Long Length of Stay Meetings, dates discussed:    Additional Comments:  Jeremy [name], Jeremy Jaquith Lynn, RN 02/10/2016, 10:58 AM

## 2016-02-11 ENCOUNTER — Encounter (HOSPITAL_COMMUNITY)
Admission: EM | Disposition: A | Discharge status: Hospice | Payer: Self-pay | Source: Home / Self Care | Attending: Internal Medicine

## 2016-02-11 DIAGNOSIS — N390 Urinary tract infection, site not specified: Secondary | ICD-10-CM

## 2016-02-11 DIAGNOSIS — T83511A Infection and inflammatory reaction due to indwelling urethral catheter, initial encounter: Secondary | ICD-10-CM

## 2016-02-11 LAB — CBC
HEMATOCRIT: 32.2 % — AB (ref 39.0–52.0)
HEMOGLOBIN: 10.6 g/dL — AB (ref 13.0–17.0)
MCH: 26 pg (ref 26.0–34.0)
MCHC: 32.9 g/dL (ref 30.0–36.0)
MCV: 78.9 fL (ref 78.0–100.0)
Platelets: 249 10*3/uL (ref 150–400)
RBC: 4.08 MIL/uL — AB (ref 4.22–5.81)
RDW: 16.6 % — ABNORMAL HIGH (ref 11.5–15.5)
WBC: 23.4 10*3/uL — ABNORMAL HIGH (ref 4.0–10.5)

## 2016-02-11 LAB — BASIC METABOLIC PANEL
ANION GAP: 7 (ref 5–15)
BUN: 38 mg/dL — ABNORMAL HIGH (ref 6–20)
CO2: 18 mmol/L — AB (ref 22–32)
Calcium: 9.1 mg/dL (ref 8.9–10.3)
Chloride: 121 mmol/L — ABNORMAL HIGH (ref 101–111)
Creatinine, Ser: 2.39 mg/dL — ABNORMAL HIGH (ref 0.61–1.24)
GFR calc Af Amer: 26 mL/min — ABNORMAL LOW (ref >60)
GFR, EST NON AFRICAN AMERICAN: 23 mL/min — AB (ref >60)
GLUCOSE: 111 mg/dL — AB (ref 65–99)
POTASSIUM: 3.2 mmol/L — AB (ref 3.5–5.1)
Sodium: 146 mmol/L — ABNORMAL HIGH (ref 135–145)

## 2016-02-11 SURGERY — TURP (TRANSURETHRAL RESECTION OF PROSTATE)
Anesthesia: Choice

## 2016-02-11 MED ORDER — FUROSEMIDE 10 MG/ML IJ SOLN
40.0000 mg | Freq: Once | INTRAMUSCULAR | Status: AC
  Administered 2016-02-11: 40 mg via INTRAVENOUS
  Filled 2016-02-11: qty 4

## 2016-02-11 MED ORDER — POTASSIUM CL IN DEXTROSE 5% 20 MEQ/L IV SOLN: 20.0000 meq | INTRAVENOUS | Status: DC

## 2016-02-11 MED ORDER — POTASSIUM CHLORIDE 2 MEQ/ML IV SOLN
INTRAVENOUS | Status: AC
  Administered 2016-02-11: 09:00:00 via INTRAVENOUS
  Filled 2016-02-11: qty 1000

## 2016-02-11 MED ORDER — HYDRALAZINE HCL 20 MG/ML IJ SOLN
10.0000 mg | Freq: Once | INTRAMUSCULAR | Status: AC
  Administered 2016-02-11: 10 mg via INTRAVENOUS
  Filled 2016-02-11: qty 1

## 2016-02-11 MED ORDER — SODIUM CHLORIDE 0.9 % IV BOLUS (SEPSIS)
250.0000 mL | Freq: Once | INTRAVENOUS | Status: AC
  Administered 2016-02-11: 250 mL via INTRAVENOUS

## 2016-02-11 MED ORDER — POTASSIUM CL IN DEXTROSE 5% 20 MEQ/L IV SOLN
20.0000 meq | INTRAVENOUS | Status: DC
  Administered 2016-02-12: 20 meq via INTRAVENOUS
  Filled 2016-02-11 (×3): qty 1000

## 2016-02-11 NOTE — Progress Notes (Signed)
Speech Language Pathology Treatment: Dysphagia  Patient Details Name: Jeremy [name] MRN: [id number] DOB: 1925/03/12 Today's Date: 02/11/2016 Time: 0802-0822 SLP Time Calculation (min) (ACUTE ONLY): 20 min  Assessment / Plan / Recommendation Clinical Impression  ST follow up for PO trials.  The patient remains confused and unable to follow commands.  Oral care was provided using suction.  Spontaneous swallows were not noted.  The patient was presented with thin liquids via spoon sips and due to his confusion he was unable to accept the spoon despite max multi modal cues.  When attempting to give material loss of entire bolus anteriorly was observed.  Recommend that the patient remain NPO with consideration of ice chips as mentation allows following oral care.  Recommend oral care QID.  Family was educated regarding current recommendations.  ST will continue to follow for PO readiness.     HPI HPI: 83 y.o.malewith medical history significant of Advanced dementia, indwelling foley for past year, prior UTIs with pseudomonas last year. Patient presentedto the ED with hematuria. Symptoms onset about a week prior to admission and did not improve with outpatient bactrim. Hemoglobin dropped from 9.3 to 6.6 g/dL in the emergency department. Pt had previously been assessed by SLP 2x in 2016, both recommending regular diet and thin liquids.      SLP Plan  Continue with current plan of care     Recommendations  Diet recommendations: NPO Medication Administration: Via alternative means                Oral Care Recommendations: Oral care QID;Oral care prior to ice chip/H20 Follow up Recommendations:  (TBD) Plan: Continue with current plan of care       GO               Jeremy AguasMelissa Jabree Pernice, MA, CCC-SLP Acute Rehab SLP [phone] ContrasGoodman, Jeremy [name] N 02/11/2016, 8:26 AM

## 2016-02-11 NOTE — Progress Notes (Signed)
Day of Surgery Subjective: Patient sleeping. Stable but not able to eat or drink.   Objective: Vital signs in last 24 hours: Temp:  [97.3 F (36.3 C)-99.1 F (37.3 C)] 97.3 F (36.3 C) (02/09 0800) Pulse Rate:  [87-114] 114 (02/09 0900) Resp:  [11-20] 16 (02/09 0900) BP: (143-174)/(78-97) 160/92 (02/09 0900) SpO2:  [100 %] 100 % (02/09 0900)  Intake/Output from previous day: 02/08 0701 - 02/09 0700 In: 5841.3 [I.V.:1251.3; IV Piggyback:340] Out: 4875 [Urine:4875] Intake/Output this shift: Total I/O In: 183.3 [I.V.:183.3] Out: 700 [Urine:700]  Physical Exam:  NAD Sleeping Abd soft, NT  CBI - clear on slow gtt    Lab Results:  Recent Labs  02/09/16 1919 02/10/16 0342 02/11/16 0348  HGB 12.5* 10.7* 10.6*  HCT 36.8* 32.1* 32.2*   BMET  Recent Labs  02/10/16 0342 02/11/16 0348  NA 142 146*  K 3.0* 3.2*  CL 115* 121*  CO2 18* 18*  GLUCOSE 106* 111*  BUN 37* 38*  CREATININE 2.25* 2.39*  CALCIUM 8.3* 9.1    Recent Labs  02/09/16 0625  INR 1.40   No results for input(s): LABURIN in the last 72 hours. Results for orders placed or performed during the hospital encounter of 02/08/16  Culture, blood (routine x 2)     Status: None (Preliminary result)   Collection Time: 02/08/16  5:45 PM  Result Value Ref Range Status   Specimen Description BLOOD LEFT FOREARM  Final   Special Requests BOTTLES DRAWN AEROBIC AND ANAEROBIC  Final   Culture   Final    NO GROWTH 1 DAY Performed at [facility], [address], [place], [place] [address]    Report Status PENDING  Incomplete  Culture, blood (routine x 2)     Status: None (Preliminary result)   Collection Time: 02/08/16  8:49 PM  Result Value Ref Range Status   Specimen Description BLOOD FEMORAL ARTERY  Final   Special Requests BOTTLES DRAWN AEROBIC AND ANAEROBIC  Final   Culture   Final    NO GROWTH 2 DAYS Performed at [facility], [address] [place], [place] [address]    Report  Status PENDING  Incomplete  MRSA PCR Screening     Status: Abnormal   Collection Time: 02/09/16 12:30 PM  Result Value Ref Range Status   MRSA by PCR POSITIVE (A) NEGATIVE Final    Comment:        The GeneXpert MRSA Assay (FDA approved for NASAL specimens only), is one component of a comprehensive MRSA colonization surveillance program. It is not intended to diagnose MRSA infection nor to guide or monitor treatment for MRSA infections. RESULT CALLED TO, READ BACK BY AND VERIFIED WITH: Z.SUGGS RN AT 1848 ON 02/09/16 BY S.VANHOORNE   Culture, Urine     Status: None (Preliminary result)   Collection Time: 02/10/16  1:19 PM  Result Value Ref Range Status   Specimen Description URINE, CATHETERIZED  Final   Special Requests NONE  Final   Culture   Final    CULTURE REINCUBATED FOR BETTER GROWTH Performed at [facility], [address] [place], [place] [address]    Report Status PENDING  Incomplete    Studies/Results: Ct Abdomen Pelvis Wo Contrast  Result Date: 02/10/2016 CLINICAL DATA:  Gross hematuria. EXAM: CT ABDOMEN AND PELVIS WITHOUT CONTRAST TECHNIQUE: Multidetector CT imaging of the abdomen and pelvis was performed following the standard protocol without IV contrast. COMPARISON:  CT scan 12/21/2015  FINDINGS: Lower chest: Small left pleural effusion with overlying atelectasis. Right basilar atelectasis is also noted. Coronary artery stent is noted. No pericardial effusion. Hepatobiliary: No focal hepatic lesions or intrahepatic biliary dilatation. Examination is somewhat limited by streak artifact from the patient's arms. Pancreas: No mass, inflammation or ductal dilatation but exam is limited. Spleen: Normal size.  No focal lesions. Adrenals/Urinary Tract: Persistent bilateral hydroureteronephrosis down to a 8 massively thick walled bladder which contains a Foley catheter. No discrete mass. On a prior head CT from 10/11/2014 the bladder was markedly dilated but not thick walled.  Recommend correlation with cystoscopy. Stomach/Bowel: No obvious bowel abnormality. Moderate stool noted in the rectum. Vascular/Lymphatic: Amazingly little aortic calcifications for age. No abdominal or pelvic lymphadenopathy is identified. Reproductive: The prostate gland and seminal vesicles are grossly normal. Other: Small amount of free abdominal/ pelvic fluid and mild body wall edema. No inguinal mass or adenopathy. Musculoskeletal: No significant bony findings. Remote avulsion fracture involving the lesser trochanter of the right hip. IMPRESSION: 1. Massively thick walled bladder persists. No discrete mass. Recommend correlation with cystoscopy. 2. Persistent bilateral hydroureteronephrosis. 3. Mild mesenteric edema, peritoneal fluid and body wall edema. 4. Small left effusion and bibasilar atelectasis. 5. No abdominal/pelvic lymphadenopathy. Electronically Signed   By: Rudie MeyerP.  Gallerani M.D.   On: 02/10/2016 11:04    Assessment/Plan: -gross hematuria  - resolved , will d/c CBI.   -bilateral hydro - hydro on right and left. Pt stable, but overall prognosis poor. I discussed with Dr. Ronne BinningMcKenzie and daughter and the feeling is it's best to hold off on any anesthesia/procedures given his condition and overall prognosis.   Urology will be available for any questions or concerns. Please call on call over weekend. Otherwise, discharge with foley to gravity drainage.    LOS: 2 days   Jeremy [name] 02/11/2016, 11:24 AM

## 2016-02-11 NOTE — Progress Notes (Signed)
PT demonstrated hands on understanding of Flutter device. 

## 2016-02-11 NOTE — Progress Notes (Addendum)
PROGRESS NOTE  Jeremy [name]  [name]:[id number] DOB: October 26, 1925 DOA: 02/08/2016 PCP: [name]  Brief Narrative:  Jeremy [name] is a 83 y.o. male with medical history significant of advanced dementia, indwelling foley for past year, prior UTIs with pseudomonas last year.  Patient presented to the ED with hematuria.  Symptoms onset about a week prior to admission and did not improve with outpatient bactrim.  Hemoglobin dropped from 9.3 to 6.6 g/dL in the emergency department. He was seen by urology who started continuous bladder irrigation. He was transfused 2 units of packed red blood cells with an expected rise in hemoglobin. He continues to have poor oral intake and was pocketing food this morning.  His poor eating started approximately 1-2 weeks ago according to his daughter who was present at bedside and this may be related to his urinary tract infection. If he does not get better with antibiotics, this may be a sign that his dementia has progressed, in which case, we should explore goals of care with the family before pursuing any procedures for his hematuria.  Assessment & Plan:   Principal Problem:   Hematuria Active Problems:   Dementia with behavioral disturbance   AKI (acute kidney injury) (HCC)   Orthostatic hypotension   Catheter-associated urinary tract infection (HCC)   Acute cystitis with hematuria  Acute blood loss anemia secondary to hematuria possibly related to catheter associated urinary tract infection due to indwelling foley catheter present at time of admission, however he may have some other underlying bladder etiology -  Continuous bladder irrigation -  Continue cefepime and vancomycin for urinary tract infection -  Urine culture growing enterococcus -  Blood cultures NGTD -  2 units PRBCs given on 02/09/2016 -  Transfuse to keep hemoglobin greater than 7-8 g/dL -  Possible cystoscopy if he perks up -  Iron level low, but ferritin elevated (sign of malignancy?),  folate and B12 wnl -  TSH elevated (could be sick euthyroid) -  Check fT4  Dysphagia, pocketing food and recommending NPO -  SLP assistance appreciated -  Changed medications to IV where possible -  Start IVF  Acute kidney injury, baseline creatinine of 1, 2.96 admission, but rose somewhat today  Acute metabolic encephalopathy superimposed on dementia, mentation improving with IV fluids, antibiotics, and blood transfusion  Chronic hypotension but BP currently elevated -  Continue to hold midodrine -  was on Florinef back in November according to DC summary but no longer on his medication list -  Continue hydralazine prn  Leukocytosis, may be secondary to urinary tract infection versus acute anemia versus underlying anemia -  Repeat CBC in a.m. -  Antibiotics as above  Hypernatremia, not tolerating PO -  Change to hypotonic fluids  Lower extremity edema -  Lasix once  DVT prophylaxis:  SCDs Code Status:  Full code Family Communication:  Daughter [name] and her husband who was at bedside Disposition Plan:  Not able to eat and drink currently.  Bleeding has slowed and hemoglobin relatively stable for now. Offered palliative care consultation but family has declined for now and will discuss amongst themselves.  I offered to conduct a family meeting or answer any questions.  If not improving, will likely move towards hospice.     Consultants:   Urology, Dr. Marlou Porch  Procedures:  Initiation of CBI on 2/7  Antimicrobials:  Anti-infectives    Start     Dose/Rate Route Frequency Ordered Stop   02/11/16 1600  vancomycin (VANCOCIN)  500 mg in sodium chloride 0.9 % 100 mL IVPB     500 mg 100 mL/hr over 60 Minutes Intravenous Every 48 hours 02/09/16 1603     02/09/16 1700  vancomycin (VANCOCIN) IVPB 1000 mg/200 mL premix     1,000 mg 200 mL/hr over 60 Minutes Intravenous  Once 02/09/16 1603 02/09/16 1847   02/09/16 0600  ceFEPIme (MAXIPIME) 1 g in dextrose 5 % 50 mL IVPB     1  g 100 mL/hr over 30 Minutes Intravenous Every 24 hours 02/09/16 0255     02/08/16 2100  cefTRIAXone (ROCEPHIN) 2 g in dextrose 5 % 50 mL IVPB     2 g 100 mL/hr over 30 Minutes Intravenous  Once 02/08/16 2054 02/08/16 2153       Subjective:  Denies headache, chest pain, abdominal pain, difficulty breathing, nausea.    Objective: Vitals:   02/11/16 0600 02/11/16 0700 02/11/16 0800 02/11/16 0900  BP: (!) 143/84 (!) 152/82 (!) 156/83 (!) 160/92  Pulse: (!) 113 (!) 109 (!) 109 (!) 114  Resp: 14 14 14 16   Temp:   97.3 F (36.3 C)   TempSrc:   Oral   SpO2: 100% 100% 100% 100%  Weight:      Height:        Intake/Output Summary (Last 24 hours) at 02/11/16 1618 Last data filed at 02/11/16 0930  Gross per 24 hour  Intake          3058.33 ml  Output             3475 ml  Net          -416.67 ml   Filed Weights   02/08/16 1607  Weight: 60.3 kg (133 lb)    Examination:  General exam:  Adult Male.  No acute distress.  Asleep but arouseable but barely intelligible HEENT:  NCAT, MMM Respiratory system: Clear to auscultation bilaterally Cardiovascular system: Regular rate and rhythm, normal S1/S2. 2/6 systolic murmur, rubs, gallops or clicks.  Warm extremities Gastrointestinal system: Normal active bowel sounds, soft, nondistended, nontender. MSK:  Normal tone and bulk, no lower extremity edema Neuro:  Grossly moves all extremities GU:  Light pinkish tinge to bladder irrigation    Data Reviewed: I have personally reviewed following labs and imaging studies  CBC:  Recent Labs Lab 02/08/16 1800 02/09/16 0538 02/09/16 1252 02/09/16 1919 02/10/16 0342 02/11/16 0348  WBC 20.0* 24.4*  --   --  23.9* 23.4*  HGB 9.3* 6.6* 9.8* 12.5* 10.7* 10.6*  HCT 28.6* 20.3* 29.9* 36.8* 32.1* 32.2*  MCV 75.9* 75.2*  --   --  78.1 78.9  PLT 351 408*  --   --  277 249   Basic Metabolic Panel:  Recent Labs Lab 02/08/16 1800 02/09/16 0613 02/10/16 0342 02/11/16 0348  NA 137 141 142  146*  K 3.3* 3.5 3.0* 3.2*  CL 105 112* 115* 121*  CO2 21* 18* 18* 18*  GLUCOSE 96 95 106* 111*  BUN 48* 41* 37* 38*  CREATININE 2.96* 2.65* 2.25* 2.39*  CALCIUM 9.1 8.5* 8.3* 9.1  MG  --   --  1.6*  --    GFR: Estimated Creatinine Clearance: 18.2 mL/min (by C-G formula based on SCr of 2.39 mg/dL (H)). Liver Function Tests:  Recent Labs Lab 02/08/16 1800  AST 32  ALT 13*  ALKPHOS 87  BILITOT 0.3  PROT 9.8*  ALBUMIN 2.6*   No results for input(s): LIPASE, AMYLASE in the last 168 hours.  No results for input(s): AMMONIA in the last 168 hours. Coagulation Profile:  Recent Labs Lab 02/09/16 0625  INR 1.40   Cardiac Enzymes: No results for input(s): CKTOTAL, CKMB, CKMBINDEX, TROPONINI in the last 168 hours. BNP (last 3 results) No results for input(s): PROBNP in the last 8760 hours. HbA1C: No results for input(s): HGBA1C in the last 72 hours. CBG: No results for input(s): GLUCAP in the last 168 hours. Lipid Profile: No results for input(s): CHOL, HDL, LDLCALC, TRIG, CHOLHDL, LDLDIRECT in the last 72 hours. Thyroid Function Tests:  Recent Labs  02/10/16 0342  TSH 8.573*  FREET4 0.83   Anemia Panel:  Recent Labs  02/10/16 0342  VITAMINB12 491  FOLATE 20.5  FERRITIN 438*  TIBC 164*  IRON 27*   Urine analysis:    Component Value Date/Time   COLORURINE RED (A) 02/08/2016 1614   APPEARANCEUR CLOUDY (A) 02/08/2016 1614   APPEARANCEUR Cloudy (A) 01/06/2016 1618   LABSPEC 1.013 02/08/2016 1614   PHURINE 6.0 02/08/2016 1614   GLUCOSEU NEGATIVE 02/08/2016 1614   HGBUR LARGE (A) 02/08/2016 1614   BILIRUBINUR NEGATIVE 02/08/2016 1614   BILIRUBINUR Negative 01/06/2016 1618   KETONESUR NEGATIVE 02/08/2016 1614   PROTEINUR >=300 (A) 02/08/2016 1614   UROBILINOGEN 0.2 11/08/2014 0334   NITRITE NEGATIVE 02/08/2016 1614   LEUKOCYTESUR LARGE (A) 02/08/2016 1614   LEUKOCYTESUR 3+ (A) 01/06/2016 1618   Sepsis  Labs: @LABRCNTIP (procalcitonin:4,lacticidven:4)  ) Recent Results (from the past 240 hour(s))  Culture, blood (routine x 2)     Status: None (Preliminary result)   Collection Time: 02/08/16  5:45 PM  Result Value Ref Range Status   Specimen Description BLOOD LEFT FOREARM  Final   Special Requests BOTTLES DRAWN AEROBIC AND ANAEROBIC  Final   Culture   Final    NO GROWTH 2 DAYS Performed at [facility], [address], [place], [place] [address]    Report Status PENDING  Incomplete  Culture, blood (routine x 2)     Status: None (Preliminary result)   Collection Time: 02/08/16  8:49 PM  Result Value Ref Range Status   Specimen Description BLOOD FEMORAL ARTERY  Final   Special Requests BOTTLES DRAWN AEROBIC AND ANAEROBIC  Final   Culture   Final    NO GROWTH 3 DAYS Performed at [facility], [address] [place], [place] [address]    Report Status PENDING  Incomplete  MRSA PCR Screening     Status: Abnormal   Collection Time: 02/09/16 12:30 PM  Result Value Ref Range Status   MRSA by PCR POSITIVE (A) NEGATIVE Final    Comment:        The GeneXpert MRSA Assay (FDA approved for NASAL specimens only), is one component of a comprehensive MRSA colonization surveillance program. It is not intended to diagnose MRSA infection nor to guide or monitor treatment for MRSA infections. RESULT CALLED TO, READ BACK BY AND VERIFIED WITH: Z.SUGGS RN AT 1848 ON 02/09/16 BY S.VANHOORNE   Culture, Urine     Status: Abnormal (Preliminary result)   Collection Time: 02/10/16  1:19 PM  Result Value Ref Range Status   Specimen Description URINE, CATHETERIZED  Final   Special Requests NONE  Final   Culture (A)  Final    60,000 COLONIES/mL ENTEROCOCCUS FAECALIS SUSCEPTIBILITIES TO FOLLOW Performed at [facility], [address] [place], [place] [address]    Report Status PENDING  Incomplete      Radiology  Studies: Ct Abdomen Pelvis Wo Contrast  Result Date:  02/10/2016 CLINICAL DATA:  Gross hematuria. EXAM: CT ABDOMEN AND PELVIS WITHOUT CONTRAST TECHNIQUE: Multidetector CT imaging of the abdomen and pelvis was performed following the standard protocol without IV contrast. COMPARISON:  CT scan 12/21/2015 FINDINGS: Lower chest: Small left pleural effusion with overlying atelectasis. Right basilar atelectasis is also noted. Coronary artery stent is noted. No pericardial effusion. Hepatobiliary: No focal hepatic lesions or intrahepatic biliary dilatation. Examination is somewhat limited by streak artifact from the patient's arms. Pancreas: No mass, inflammation or ductal dilatation but exam is limited. Spleen: Normal size.  No focal lesions. Adrenals/Urinary Tract: Persistent bilateral hydroureteronephrosis down to a 8 massively thick walled bladder which contains a Foley catheter. No discrete mass. On a prior head CT from 10/11/2014 the bladder was markedly dilated but not thick walled. Recommend correlation with cystoscopy. Stomach/Bowel: No obvious bowel abnormality. Moderate stool noted in the rectum. Vascular/Lymphatic: Amazingly little aortic calcifications for age. No abdominal or pelvic lymphadenopathy is identified. Reproductive: The prostate gland and seminal vesicles are grossly normal. Other: Small amount of free abdominal/ pelvic fluid and mild body wall edema. No inguinal mass or adenopathy. Musculoskeletal: No significant bony findings. Remote avulsion fracture involving the lesser trochanter of the right hip. IMPRESSION: 1. Massively thick walled bladder persists. No discrete mass. Recommend correlation with cystoscopy. 2. Persistent bilateral hydroureteronephrosis. 3. Mild mesenteric edema, peritoneal fluid and body wall edema. 4. Small left effusion and bibasilar atelectasis. 5. No abdominal/pelvic lymphadenopathy. Electronically Signed   By: Rudie MeyerP.  Gallerani M.D.   On: 02/10/2016 11:04     Scheduled Meds: . sodium chloride   Intravenous Once  . ceFEPime  (MAXIPIME) IV  1 g Intravenous Q24H  . Chlorhexidine Gluconate Cloth  6 each Topical Q0600  . dorzolamide  1 drop Right Eye BID  . famotidine (PEPCID) IV  10 mg Intravenous Q24H  . latanoprost  1 drop Both Eyes QHS  . mupirocin ointment  1 application Nasal BID  . timolol  1 drop Both Eyes BID  . vancomycin  500 mg Intravenous Q48H   Continuous Infusions: . dextrose 5 % with KCl 20 mEq / L 20 mEq (02/11/16 0930)     LOS: 2 days    Time spent: 30 min    Renae FickleSHORT, Masin Shatto, MD [facility] Pager [phone]  If 7PM-7AM, please contact night-coverage [email or web address] Password [email or web address] 02/11/2016, 4:18 PM

## 2016-02-12 LAB — CBC
HCT: 32.5 % — ABNORMAL LOW (ref 39.0–52.0)
Hemoglobin: 10.7 g/dL — ABNORMAL LOW (ref 13.0–17.0)
MCH: 26.2 pg (ref 26.0–34.0)
MCHC: 32.9 g/dL (ref 30.0–36.0)
MCV: 79.5 fL (ref 78.0–100.0)
Platelets: 230 10*3/uL (ref 150–400)
RBC: 4.09 MIL/uL — AB (ref 4.22–5.81)
RDW: 17.2 % — ABNORMAL HIGH (ref 11.5–15.5)
WBC: 24.7 10*3/uL — ABNORMAL HIGH (ref 4.0–10.5)

## 2016-02-12 LAB — URINE CULTURE: Culture: 60000 — AB

## 2016-02-12 LAB — BASIC METABOLIC PANEL
Anion gap: 9 (ref 5–15)
BUN: 40 mg/dL — ABNORMAL HIGH (ref 6–20)
CHLORIDE: 122 mmol/L — AB (ref 101–111)
CO2: 18 mmol/L — AB (ref 22–32)
CREATININE: 2.66 mg/dL — AB (ref 0.61–1.24)
Calcium: 9.8 mg/dL (ref 8.9–10.3)
GFR calc non Af Amer: 20 mL/min — ABNORMAL LOW (ref >60)
GFR, EST AFRICAN AMERICAN: 23 mL/min — AB (ref >60)
Glucose, Bld: 103 mg/dL — ABNORMAL HIGH (ref 65–99)
POTASSIUM: 3 mmol/L — AB (ref 3.5–5.1)
Sodium: 149 mmol/L — ABNORMAL HIGH (ref 135–145)

## 2016-02-12 MED ORDER — SODIUM CHLORIDE 0.9 % IV SOLN
30.0000 meq | Freq: Once | INTRAVENOUS | Status: AC
  Administered 2016-02-12: 30 meq via INTRAVENOUS
  Filled 2016-02-12: qty 15

## 2016-02-12 MED ORDER — POTASSIUM CL IN DEXTROSE 5% 20 MEQ/L IV SOLN
20.0000 meq | INTRAVENOUS | Status: DC
  Administered 2016-02-12 – 2016-02-13 (×2): 20 meq via INTRAVENOUS
  Filled 2016-02-12 (×2): qty 1000

## 2016-02-12 MED ORDER — SODIUM CHLORIDE 0.9 % IV SOLN
500.0000 mg | Freq: Two times a day (BID) | INTRAVENOUS | Status: DC
  Administered 2016-02-12 (×2): 500 mg via INTRAVENOUS
  Filled 2016-02-12 (×3): qty 0.5

## 2016-02-12 MED ORDER — ACETAMINOPHEN 650 MG RE SUPP: 650.0000 mg | Freq: Four times a day (QID) | RECTAL | Status: DC | PRN

## 2016-02-12 NOTE — Progress Notes (Signed)
Speech Language Pathology Treatment: Dysphagia  Patient Details Name: Jeremy CoxJames T Berntsen MRN: [id number] DOB: 1925-12-16 Today's Date: 02/12/2016 Time: [id number] SLP Time Calculation (min) (ACUTE ONLY): 23 min  Assessment / Plan / Recommendation Clinical Impression  Pt lethargic, nods head to y/n questions; daughter and son-in-law at bedside. Oral holding and required tactile cues for labial seal with min labial residue. Aspiration likely occurred however no reflexive cough. Significantly reduced laryngeal elevation and maintained elevation in attempts to protect airway. Multiple weak swallows with tsp pudding and ice chip. He appeared to gag and almost dry heave when thick mucous observed in oral cavity. SLP suctioned out a 2" x 2" bloody mucous clot. Pt then nodded he felt better and promptly relaxed. Question if overall prognosis is poor. Do not think he will regain swallow function. Recommend oral care with ice chips as pt desires for comfort. Further ST not warranted at present. If he rebounds and ST is needed, please reconsult. Thank you.   HPI HPI: 83 y.o.malewith medical history significant of Advanced dementia, indwelling foley for past year, prior UTIs with pseudomonas last year. Patient presentedto the ED with hematuria. Symptoms onset about a week prior to admission and did not improve with outpatient bactrim. Hemoglobin dropped from 9.3 to 6.6 g/dL in the emergency department. Pt had previously been assessed by SLP 2x in 2016, both recommending regular diet and thin liquids.      SLP Plan  Continue with current plan of care     Recommendations  Diet recommendations:  (NPO except for ice chips) Medication Administration: Via alternative means Postural Changes and/or Swallow Maneuvers: Seated upright 90 degrees                Oral Care Recommendations: Oral care QID;Oral care prior to ice chip/H20 Follow up Recommendations: None Plan: Continue with current plan of  care       GO                Royce MacadamiaLitaker, Zorian Gunderman Willis 02/12/2016, 6:30 PM  Breck CoonsLisa Willis Lonell FaceLitaker M.Ed [employer]-SLP Pager [phone]

## 2016-02-12 NOTE — Progress Notes (Signed)
Pharmacy Antibiotic Note  Jeremy [name] is a 83 y.o. male admitted on 02/08/2016 with hematuria, clots in catheter and UTI.  Pharmacy has been consulted for Vancomycin and Meropenem dosing.  UTI history includes pseudomonas (08/2015) and MRSA (x2 in 2017).  Today, 02/12/2016:  Fevers, Tmax 99.7  WBC remains elevated, 24.7  SCr 2.66 remains elevated and increasing, CrCl ~ 16 ml/min  Continue Enterococcus coverage and also cover for new concern of possible ESBL organisms not growing in cultures d/t failure to improve.  Plan:  Meropenem 500mg  IV q12h  Continue Vancomycin, but hold further doses d/t worsening renal function.  Measure Vanc trough prior to next dose due on 2/11 ~ 1600  Follow up renal fxn, culture results, and clinical course.    Height: 6' (182.9 cm) Weight: 133 lb (60.3 kg) IBW/kg (Calculated) : 77.6  Temp (24hrs), Avg:98.7 F (37.1 C), Min:97.6 F (36.4 C), Max:99.7 F (37.6 C)   Recent Labs Lab 02/08/16 1800 02/08/16 1815 02/09/16 0027 02/09/16 0538 02/09/16 [id number] 02/10/16 0342 02/11/16 0348 02/12/16 0526  WBC 20.0*  --   --  24.4*  --  23.9* 23.4* 24.7*  CREATININE 2.96*  --   --   --  2.65* 2.25* 2.39* 2.66*  LATICACIDVEN  --  2.89* 1.47  --   --   --   --   --     Estimated Creatinine Clearance: 16.4 mL/min (by C-G formula based on SCr of 2.66 mg/dL (H)).    Allergies  Allergen Reactions  . Tramadol Nausea And Vomiting    Antimicrobials this admission: 2/6 rocephin >> x1 ED 2/7 cefepime >> 2/10 2/7 vancomycin >> 2/10  meropenem >>  Dose adjustments this admission: 2/11 1600 VT = ____  Microbiology results: 2/6 BCx: ngtd 2/6 UCx: 60,000 Enterococcus Faecalis (sens: ampicillin, NTF, vanc) 2/7 MRSA PCR: postive  Thank you for allowing pharmacy to be a part of this patient's care.  Lynann Beaverhristine Shelsie Tijerino PharmD, BCPS Pager [phone] 02/12/2016 8:08 AM

## 2016-02-12 NOTE — Progress Notes (Signed)
PROGRESS NOTE  Jeremy [name]  [name]:[id number] DOB: [birth year] DOA: 02/08/2016 PCP: [name]  Brief Narrative:  Jeremy [name] is a 83 y.o. male with medical history significant of advanced dementia, indwelling foley for past year, prior UTIs with pseudomonas last year.  Patient presented to the ED with hematuria.  Symptoms onset about a week prior to admission and did not improve with outpatient bactrim.  Hemoglobin dropped from 9.3 to 6.6 g/dL in the emergency department. He was seen by urology who started continuous bladder irrigation. He was transfused 2 units of packed red blood cells with an expected rise in hemoglobin. He continues to have poor oral intake and was pocketing food this morning.  His poor eating started approximately 1-2 weeks ago according to his daughter who was present at bedside and this may be related to his urinary tract infection.  Worsening leukocytosis and rising fever.  Will escalate cefepime to meropenem today in case he has underlying ESBL infection and planning GOC conversation tomorrow.    Assessment & Plan:   Principal Problem:   Hematuria Active Problems:   Dementia with behavioral disturbance   AKI (acute kidney injury) (HCC)   Orthostatic hypotension   Catheter-associated urinary tract infection (HCC)   Acute cystitis with hematuria  Acute blood loss anemia secondary to hematuria possibly related to catheter associated urinary tract infection due to indwelling foley catheter present at time of admission, however he may have some other underlying bladder etiology -  Continuous bladder irrigation -  D/c cefepime and start meropenem -  Continue vancomycin day 5 of 7 -  Urine culture growing enterococcus -  Blood cultures NGTD -  2 units PRBCs given on 02/09/2016 -  Transfuse to keep hemoglobin greater than 7-8 g/dL -  Possible cystoscopy if he perks up -  Iron level low, but ferritin elevated (sign of malignancy?), folate and B12 wnl -  TSH elevated  (could be sick euthyroid) -  Check fT4  Dysphagia, pocketing food and recommending NPO -  SLP assistance appreciated -  Changed medications to IV where possible -  Continue IVF  Acute kidney injury, baseline creatinine of 1, 2.96 admission, creatinine rising more today  Acute metabolic encephalopathy superimposed on dementia, mentation improving with IV fluids, antibiotics, and blood transfusion  Chronic hypotension but BP currently elevated -  Continue to hold midodrine -  was on Florinef back in November according to DC summary but no longer on his medication list -  Continue hydralazine prn  Leukocytosis, may be secondary to urinary tract infection versus acute anemia versus underlying anemia -  Repeat CBC in a.m. -  Antibiotics as above  Hypernatremia, not tolerating PO -  Change to d5W fluids  Lower extremity edema  DVT prophylaxis:  SCDs Code Status:  Full code Family Communication:  Daughter Carollee Herter who was at bedside Disposition Plan:  Anticipate in-hospital death vs. Residential hospice pending GOC conversation with family   Consultants:   Urology, Dr. Marlou Porch  Procedures:  Initiation of CBI on 2/7  Antimicrobials:  Anti-infectives    Start     Dose/Rate Route Frequency Ordered Stop   02/12/16 1000  meropenem (MERREM) 500 mg in sodium chloride 0.9 % 50 mL IVPB     500 mg 100 mL/hr over 30 Minutes Intravenous Every 12 hours 02/12/16 0825     02/11/16 1600  vancomycin (VANCOCIN) 500 mg in sodium chloride 0.9 % 100 mL IVPB  Status:  Discontinued     500 mg  100 mL/hr over 60 Minutes Intravenous Every 48 hours 02/09/16 1603 02/12/16 0831   02/09/16 1700  vancomycin (VANCOCIN) IVPB 1000 mg/200 mL premix     1,000 mg 200 mL/hr over 60 Minutes Intravenous  Once 02/09/16 1603 02/09/16 1847   02/09/16 0600  ceFEPIme (MAXIPIME) 1 g in dextrose 5 % 50 mL IVPB  Status:  Discontinued     1 g 100 mL/hr over 30 Minutes Intravenous Every 24 hours 02/09/16 0255 02/12/16  0707   02/08/16 2100  cefTRIAXone (ROCEPHIN) 2 g in dextrose 5 % 50 mL IVPB     2 g 100 mL/hr over 30 Minutes Intravenous  Once 02/08/16 2054 02/08/16 2153       Subjective:  Per daughter:  Seems to have some pain when he is turned from side to side, but otherwise has been resting comfortably.  Unable to eat or drink.    Objective: Vitals:   02/11/16 2314 02/12/16 0500 02/12/16 0632 02/12/16 0805  BP: (!) 164/82 (!) 171/88 138/71 136/77  Pulse: (!) 102 (!) 106 (!) 105 (!) 104  Resp: 20 (!) 21  20  Temp: 97.6 F (36.4 C) 99.4 F (37.4 C)  100.1 F (37.8 C)  TempSrc: Axillary Axillary  Rectal  SpO2: 100% 99% 99% 99%  Weight:      Height:        Intake/Output Summary (Last 24 hours) at 02/12/16 1231 Last data filed at 02/12/16 0700  Gross per 24 hour  Intake             1150 ml  Output             3100 ml  Net            -1950 ml   Filed Weights   02/08/16 1607  Weight: 60.3 kg (133 lb)    Examination:  General exam:  Adult Male.  No acute distress.  Asleep but arouseable.  Barely nods and shakes head to answer questions HEENT:  NCAT, MMM Respiratory system: Clear to auscultation bilaterally Cardiovascular system:  Tachycardic, regular rhythm normal S1/S2. 2/6 systolic murmur, rubs, gallops or clicks.  Warm extremities Gastrointestinal system: Normal active bowel sounds, soft, nondistended, nontender. MSK:  Normal tone and bulk, no lower extremity edema Neuro:  Grossly moves all extremities GU:  Light pinkish tinge to bladder irrigation    Data Reviewed: I have personally reviewed following labs and imaging studies  CBC:  Recent Labs Lab 02/08/16 1800 02/09/16 0538 02/09/16 1252 02/09/16 1919 02/10/16 0342 02/11/16 0348 02/12/16 0526  WBC 20.0* 24.4*  --   --  23.9* 23.4* 24.7*  HGB 9.3* 6.6* 9.8* 12.5* 10.7* 10.6* 10.7*  HCT 28.6* 20.3* 29.9* 36.8* 32.1* 32.2* 32.5*  MCV 75.9* 75.2*  --   --  78.1 78.9 79.5  PLT 351 408*  --   --  277 249 230    Basic Metabolic Panel:  Recent Labs Lab 02/08/16 1800 02/09/16 0613 02/10/16 0342 02/11/16 0348 02/12/16 0526  NA 137 141 142 146* 149*  K 3.3* 3.5 3.0* 3.2* 3.0*  CL 105 112* 115* 121* 122*  CO2 21* 18* 18* 18* 18*  GLUCOSE 96 95 106* 111* 103*  BUN 48* 41* 37* 38* 40*  CREATININE 2.96* 2.65* 2.25* 2.39* 2.66*  CALCIUM 9.1 8.5* 8.3* 9.1 9.8  MG  --   --  1.6*  --   --    GFR: Estimated Creatinine Clearance: 16.4 mL/min (by C-G formula based on SCr of  2.66 mg/dL (H)). Liver Function Tests:  Recent Labs Lab 02/08/16 1800  AST 32  ALT 13*  ALKPHOS 87  BILITOT 0.3  PROT 9.8*  ALBUMIN 2.6*   No results for input(s): LIPASE, AMYLASE in the last 168 hours. No results for input(s): AMMONIA in the last 168 hours. Coagulation Profile:  Recent Labs Lab 02/09/16 0625  INR 1.40   Cardiac Enzymes: No results for input(s): CKTOTAL, CKMB, CKMBINDEX, TROPONINI in the last 168 hours. BNP (last 3 results) No results for input(s): PROBNP in the last 8760 hours. HbA1C: No results for input(s): HGBA1C in the last 72 hours. CBG: No results for input(s): GLUCAP in the last 168 hours. Lipid Profile: No results for input(s): CHOL, HDL, LDLCALC, TRIG, CHOLHDL, LDLDIRECT in the last 72 hours. Thyroid Function Tests:  Recent Labs  02/10/16 0342  TSH 8.573*  FREET4 0.83   Anemia Panel:  Recent Labs  02/10/16 0342  VITAMINB12 491  FOLATE 20.5  FERRITIN 438*  TIBC 164*  IRON 27*   Urine analysis:    Component Value Date/Time   COLORURINE RED (A) 02/08/2016 1614   APPEARANCEUR CLOUDY (A) 02/08/2016 1614   APPEARANCEUR Cloudy (A) 01/06/2016 1618   LABSPEC 1.013 02/08/2016 1614   PHURINE 6.0 02/08/2016 1614   GLUCOSEU NEGATIVE 02/08/2016 1614   HGBUR LARGE (A) 02/08/2016 1614   BILIRUBINUR NEGATIVE 02/08/2016 1614   BILIRUBINUR Negative 01/06/2016 1618   KETONESUR NEGATIVE 02/08/2016 1614   PROTEINUR >=300 (A) 02/08/2016 1614   UROBILINOGEN 0.2 11/08/2014 0334    NITRITE NEGATIVE 02/08/2016 1614   LEUKOCYTESUR LARGE (A) 02/08/2016 1614   LEUKOCYTESUR 3+ (A) 01/06/2016 1618   Sepsis Labs: @LABRCNTIP (procalcitonin:4,lacticidven:4)  ) Recent Results (from the past 240 hour(s))  Culture, blood (routine x 2)     Status: None (Preliminary result)   Collection Time: 02/08/16  5:45 PM  Result Value Ref Range Status   Specimen Description BLOOD LEFT FOREARM  Final   Special Requests BOTTLES DRAWN AEROBIC AND ANAEROBIC  Final   Culture   Final    NO GROWTH 3 DAYS Performed at [facility], [address], [place], [place] [address]    Report Status PENDING  Incomplete  Culture, blood (routine x 2)     Status: None (Preliminary result)   Collection Time: 02/08/16  8:49 PM  Result Value Ref Range Status   Specimen Description BLOOD FEMORAL ARTERY  Final   Special Requests BOTTLES DRAWN AEROBIC AND ANAEROBIC  Final   Culture   Final    NO GROWTH 4 DAYS Performed at [facility], [address] [place], [place] [address]    Report Status PENDING  Incomplete  MRSA PCR Screening     Status: Abnormal   Collection Time: 02/09/16 12:30 PM  Result Value Ref Range Status   MRSA by PCR POSITIVE (A) NEGATIVE Final    Comment:        The GeneXpert MRSA Assay (FDA approved for NASAL specimens only), is one component of a comprehensive MRSA colonization surveillance program. It is not intended to diagnose MRSA infection nor to guide or monitor treatment for MRSA infections. RESULT CALLED TO, READ BACK BY AND VERIFIED WITH: Z.SUGGS RN AT 1848 ON 02/09/16 BY S.VANHOORNE   Culture, Urine     Status: Abnormal   Collection Time: 02/10/16  1:19 PM  Result Value Ref Range Status   Specimen Description URINE, CATHETERIZED  Final   Special Requests NONE  Final  Culture 60,000 COLONIES/mL ENTEROCOCCUS FAECALIS (A)  Final   Report Status 02/12/2016 FINAL  Final   Organism ID, Bacteria ENTEROCOCCUS FAECALIS (A)  Final       Susceptibility   Enterococcus faecalis - MIC*    AMPICILLIN <=2 SENSITIVE Sensitive     LEVOFLOXACIN >=8 RESISTANT Resistant     NITROFURANTOIN <=16 SENSITIVE Sensitive     VANCOMYCIN 1 SENSITIVE Sensitive     * 60,000 COLONIES/mL ENTEROCOCCUS FAECALIS      Radiology Studies: No results found.   Scheduled Meds: . sodium chloride   Intravenous Once  . Chlorhexidine Gluconate Cloth  6 each Topical Q0600  . dorzolamide  1 drop Right Eye BID  . famotidine (PEPCID) IV  10 mg Intravenous Q24H  . latanoprost  1 drop Both Eyes QHS  . meropenem (MERREM) IV  500 mg Intravenous Q12H  . mupirocin ointment  1 application Nasal BID  . potassium chloride (KCL MULTIRUN) 30 mEq in 265 mL IVPB  30 mEq Intravenous Once  . timolol  1 drop Both Eyes BID   Continuous Infusions: . dextrose 5 % with KCl 20 mEq / L 20 mEq (02/12/16 0706)     LOS: 3 days    Time spent: 30 min    Renae Fickle, MD [facility] Pager [phone]  If 7PM-7AM, please contact night-coverage [email or web address] Password [facility] 02/12/2016, 12:31 PM

## 2016-02-13 LAB — BASIC METABOLIC PANEL
ANION GAP: 8 (ref 5–15)
BUN: 43 mg/dL — ABNORMAL HIGH (ref 6–20)
CALCIUM: 9.4 mg/dL (ref 8.9–10.3)
CO2: 16 mmol/L — ABNORMAL LOW (ref 22–32)
Chloride: 119 mmol/L — ABNORMAL HIGH (ref 101–111)
Creatinine, Ser: 3.4 mg/dL — ABNORMAL HIGH (ref 0.61–1.24)
GFR calc Af Amer: 17 mL/min — ABNORMAL LOW (ref >60)
GFR, EST NON AFRICAN AMERICAN: 15 mL/min — AB (ref >60)
GLUCOSE: 105 mg/dL — AB (ref 65–99)
Potassium: 3.6 mmol/L (ref 3.5–5.1)
Sodium: 143 mmol/L (ref 135–145)

## 2016-02-13 LAB — CULTURE, BLOOD (ROUTINE X 2): CULTURE: NO GROWTH

## 2016-02-13 LAB — CBC
HCT: 31.5 % — ABNORMAL LOW (ref 39.0–52.0)
HEMOGLOBIN: 10.1 g/dL — AB (ref 13.0–17.0)
MCH: 25.8 pg — ABNORMAL LOW (ref 26.0–34.0)
MCHC: 32.1 g/dL (ref 30.0–36.0)
MCV: 80.6 fL (ref 78.0–100.0)
Platelets: 209 10*3/uL (ref 150–400)
RBC: 3.91 MIL/uL — ABNORMAL LOW (ref 4.22–5.81)
RDW: 17.7 % — AB (ref 11.5–15.5)
WBC: 27.7 10*3/uL — ABNORMAL HIGH (ref 4.0–10.5)

## 2016-02-13 MED ORDER — ACETAMINOPHEN 650 MG RE SUPP: 650.0000 mg | Freq: Four times a day (QID) | RECTAL | 0 refills | Status: AC | PRN

## 2016-02-13 MED ORDER — ATROPINE SULFATE 1 % OP SOLN: 4.0000 [drp] | OPHTHALMIC | 12 refills | Status: AC | PRN

## 2016-02-13 MED ORDER — ONDANSETRON 4 MG PO TBDP: 4.0000 mg | ORAL_TABLET | Freq: Three times a day (TID) | ORAL | 0 refills | Status: AC | PRN

## 2016-02-13 MED ORDER — MORPHINE SULFATE (CONCENTRATE) 20 MG/ML PO SOLN: 5.0000 mg | ORAL | 0 refills | Status: AC | PRN

## 2016-02-13 MED ORDER — ATROPINE SULFATE 1 % OP SOLN
4.0000 [drp] | OPHTHALMIC | Status: DC | PRN
  Administered 2016-02-13: 4 [drp] via SUBLINGUAL
  Filled 2016-02-13: qty 2

## 2016-02-13 MED ORDER — LORAZEPAM 2 MG/ML PO CONC: 1.0000 mg | ORAL | 0 refills | Status: AC | PRN

## 2016-02-13 MED ORDER — BISACODYL 10 MG RE SUPP: 10.0000 mg | Freq: Every day | RECTAL | 0 refills | Status: AC | PRN

## 2016-02-13 MED ORDER — POTASSIUM CL IN DEXTROSE 5% 20 MEQ/L IV SOLN
20.0000 meq | INTRAVENOUS | Status: DC
  Administered 2016-02-13: 20 meq via INTRAVENOUS
  Filled 2016-02-13: qty 1000

## 2016-02-13 NOTE — Progress Notes (Addendum)
  Pt is sleeping. Daughter wants to take him home.   Vitals:   02/13/16 0409 02/13/16 0943  BP: (!) 152/89   Pulse: 98   Resp: 20   Temp: 99.3 F (37.4 C) 99.2 F (37.3 C)    Intake/Output Summary (Last 24 hours) at 02/13/16 0952 Last data filed at 02/13/16 0412  Gross per 24 hour  Intake             2015 ml  Output              650 ml  Net             1365 ml    NAD Sleeping Abd - soft, NT Urine clear in tubing.   CBC    Component Value Date/Time   WBC 27.7 (H) 02/13/2016 0441   RBC 3.91 (L) 02/13/2016 0441   HGB 10.1 (L) 02/13/2016 0441   HCT 31.5 (L) 02/13/2016 0441   HCT 27.8 (L) 01/06/2016 1614   PLT 209 02/13/2016 0441   PLT 376 01/06/2016 1614   MCV 80.6 02/13/2016 0441   MCV 86 01/06/2016 1614   MCH 25.8 (L) 02/13/2016 0441   MCHC 32.1 02/13/2016 0441   RDW 17.7 (H) 02/13/2016 0441   RDW 14.8 01/06/2016 1614   LYMPHSABS 1.7 01/06/2016 1614   MONOABS 1.4 (H) 11/24/2015 0409   EOSABS 0.3 01/06/2016 1614   BASOSABS 0.0 01/06/2016 1614   BMET    Component Value Date/Time   NA 143 02/13/2016 0441   NA 138 01/06/2016 1614   K 3.6 02/13/2016 0441   CL 119 (H) 02/13/2016 0441   CO2 16 (L) 02/13/2016 0441   GLUCOSE 105 (H) 02/13/2016 0441   BUN 43 (H) 02/13/2016 0441   BUN 19 01/06/2016 1614   CREATININE 3.40 (H) 02/13/2016 0441   CALCIUM 9.4 02/13/2016 0441   GFRNONAA 15 (L) 02/13/2016 0441   GFRAA 17 (L) 02/13/2016 0441    A/P - discussed with pt's daughter again CT findings, labs/ARF, prognosis, surveillance vs stents - she wants to take patient home to die there in peace.

## 2016-02-13 NOTE — Care Management Important Message (Signed)
Important Message  Patient Details  Name: Jeremy [name] MRN: [id number] Date of Birth: [birth year]   Medicare Important Message Given:  Yes    Elliot CousinShavis, Paden Senger Ellen, RN 02/13/2016, 10:51 AM

## 2016-02-13 NOTE — Discharge Summary (Addendum)
Physician Discharge Summary  Jeremy [name] [name]:[id number] DOB: [birth year] DOA: 02/08/2016  PCP: [name]  Admit date: 02/08/2016 Discharge date: 02/13/2016  Admitted From: home  Disposition:  Home with hospice  Recommendations for Outpatient Follow-up:  Home hospice  Home Health:  [facility]  Equipment/Devices:  Already had appropriate equipment at home   Discharge Condition:  Stable, improved CODE STATUS:  DNR  Diet recommendation:  Ice chips and sips of water for comfort  Brief/Interim Summary:  Jeremy [name] a 83 y.o.malewith medical history significant of advanced dementia, indwelling foley for past year, prior UTIs with pseudomonas and Staph over the last year. Patient presented to the ED with hematuria. Symptoms had started about a week prior to admission and did not improve with outpatient bactrim.  Hemoglobin dropped from 9.3 to 6.6 g/dL in the emergency department.  CT demonstrated a massively thickened bladder with bilateral hydroureteronephrosis.  He was seen by urology who started continuous bladder irrigation.  He was transfused 2 units of packed red blood cells which produced an appropriate rise in hemoglobin.  Despite vancomycin and cefepime, his WBC continued to rise and his fevers worsened.  His urine culture grew only Enterococcus but was obtained several days after starting antibiotics and while he was on continuous irrigation.  His vancomyin and cefepime were discontinued and he was given a trial of meropenem which had no effect on his fevers or his rising WBC count.  Due to poor (absent) oral intake and frailty, he was deemed to be not a good candidate for procedures or particularly sedation.  Although he was seen by Urology, but was too weak to undergo cystoscopy to rule out suspected bladder cancer and place a ureteral stent.  His strength continued to decline.  After conversation with family, they elected to take him home with family and hospice support for end of life  care.    Discharge Diagnoses:  Principal Problem:   Hematuria Active Problems:   Dementia with behavioral disturbance   AKI (acute kidney injury) (HCC)   Orthostatic hypotension   Catheter-associated urinary tract infection (HCC)   Acute cystitis with hematuria  Acute blood loss anemia superimposed on iron deficiency and anemia of chronic inflammation/disease.  Acute blood loss is secondary to hematuria possibly related to catheter associated urinary tract infection due to indwelling foley catheter present at time of admission, however he may have some other underlying bladder pathology -  He was started on continuous bladder irrigation by urology, which has since been discontinued -  antibiotics discontinued -  Blood cultures NGTD -  2 units PRBCs given on 02/09/2016 -  Iron level low, but ferritin elevated suggesting malignancy -  Folate and B12 wnl -  TSH elevated (could be sick euthyroid), free T4 wnl  Dysphagia, pocketing food, refusing to open his mouth, SLP recommended NPO except for sips of water -  SLP assistance appreciated -  Changed medications to IV where possible -  given prescriptions for sublingual medications for home  Acute kidney injury, baseline creatinine of 1, 2.96 admission, creatinine initially improved but then worsened over last few days.  Suspect his hydronephrosis may have worsened or a sign of worsening sepsis and nearing end-of-life  Acute metabolic encephalopathy superimposed on dementia, mentation only minimally improved with IV fluids, antibiotics, and blood transfusion  Chronic hypotension but BP currently elevated -  Continue to hold midodrine -  was on Florinef back in November according to DC summary but no longer on his medication list -  Given hydralazine prn  Leukocytosis, may be secondary to urinary tract infection versus acute anemia versus underlying anemia, worsened despite antibiotics, but no sign of diarrhea/C. Diff.     Hypernatremia, not tolerating PO.    Lower extremity edema due to malnutrition and severe illness.  Discharge Instructions     Medication List    STOP taking these medications   CRANBERRY PO   DOK 100 MG capsule Generic drug:  docusate sodium   finasteride 5 MG tablet Commonly known as:  PROSCAR   iron polysaccharides 150 MG capsule Commonly known as:  NIFEREX   midodrine 5 MG tablet Commonly known as:  PROAMATINE   OVER THE COUNTER MEDICATION   QUEtiapine 25 MG tablet Commonly known as:  SEROQUEL   ranitidine 75 MG tablet Commonly known as:  ZANTAC   simvastatin 40 MG tablet Commonly known as:  ZOCOR   Vitamin D (Ergocalciferol) 50000 units Caps capsule Commonly known as:  DRISDOL     TAKE these medications   acetaminophen 650 MG suppository Commonly known as:  TYLENOL Place 1 suppository (650 mg total) rectally every 6 (six) hours as needed for fever.   atropine 1 % ophthalmic solution Place 4 drops under the tongue every 4 (four) hours as needed (excessive secretions).   bisacodyl 10 MG suppository Commonly known as:  DULCOLAX Place 1 suppository (10 mg total) rectally daily as needed for mild constipation or moderate constipation.   dorzolamide 2 % ophthalmic solution Commonly known as:  TRUSOPT Place 1 drop into the right eye 2 (two) times daily.   latanoprost 0.005 % ophthalmic solution Commonly known as:  XALATAN Place 1 drop into both eyes at bedtime.   LORazepam 2 MG/ML concentrated solution Commonly known as:  ATIVAN Place 0.5 mLs (1 mg total) under the tongue every 4 (four) hours as needed for anxiety or sleep.   morphine 20 MG/ML concentrated solution Commonly known as:  ROXANOL Place 0.25-0.5 mLs (5-10 mg total) under the tongue every hour as needed for moderate pain, severe pain or shortness of breath.   ondansetron 4 MG disintegrating tablet Commonly known as:  ZOFRAN ODT Take 1 tablet (4 mg total) by mouth every 8 (eight)  hours as needed for nausea or vomiting.   timolol 0.5 % ophthalmic solution Commonly known as:  TIMOPTIC Place 1 drop into both eyes 2 (two) times daily.      Follow-up Information    [facility]ice at [facility] Follow up.   Specialty:  Hospice and Palliative Medicine Why:  Home Hospice RN will contact to arrange initial appointment Contact information: [address] [place] [address] [phone]          Allergies  Allergen Reactions  . Tramadol Nausea And Vomiting    Consultations: Urology   Procedures/Studies: Ct Abdomen Pelvis Wo Contrast  Result Date: 02/10/2016 CLINICAL DATA:  Gross hematuria. EXAM: CT ABDOMEN AND PELVIS WITHOUT CONTRAST TECHNIQUE: Multidetector CT imaging of the abdomen and pelvis was performed following the standard protocol without IV contrast. COMPARISON:  CT scan 12/21/2015 FINDINGS: Lower chest: Small left pleural effusion with overlying atelectasis. Right basilar atelectasis is also noted. Coronary artery stent is noted. No pericardial effusion. Hepatobiliary: No focal hepatic lesions or intrahepatic biliary dilatation. Examination is somewhat limited by streak artifact from the patient's arms. Pancreas: No mass, inflammation or ductal dilatation but exam is limited. Spleen: Normal size.  No focal lesions. Adrenals/Urinary Tract: Persistent bilateral hydroureteronephrosis down to a 8 massively thick walled bladder which contains a  Foley catheter. No discrete mass. On a prior head CT from 10/11/2014 the bladder was markedly dilated but not thick walled. Recommend correlation with cystoscopy. Stomach/Bowel: No obvious bowel abnormality. Moderate stool noted in the rectum. Vascular/Lymphatic: Amazingly little aortic calcifications for age. No abdominal or pelvic lymphadenopathy is identified. Reproductive: The prostate gland and seminal vesicles are grossly normal. Other: Small amount of free abdominal/ pelvic fluid and mild body wall edema. No inguinal  mass or adenopathy. Musculoskeletal: No significant bony findings. Remote avulsion fracture involving the lesser trochanter of the right hip. IMPRESSION: 1. Massively thick walled bladder persists. No discrete mass. Recommend correlation with cystoscopy. 2. Persistent bilateral hydroureteronephrosis. 3. Mild mesenteric edema, peritoneal fluid and body wall edema. 4. Small left effusion and bibasilar atelectasis. 5. No abdominal/pelvic lymphadenopathy. Electronically Signed   By: Rudie Meyer M.D.   On: 02/10/2016 11:04   Mr Brain Wo Contrast  Result Date: 01/28/2016 [facility] NEUROIMAGING REPORT STUDY DATE: 01/26/16 PATIENT NAME: QUILLAN WHITTER DOB: May 15, 1925 MRN: [id number] ORDERING CLINICIAN: Butch Penny, NP / Levert Feinstein, MD PhD CLINICAL HISTORY: 83 year old male with confusion. EXAM: MRI brain (without) TECHNIQUE: MRI of the brain without contrast was obtained utilizing 5 mm axial slices with T1, T2, T2 flair, SWI and diffusion weighted views.  T1 sagittal and T2 coronal views were obtained. CONTRAST: no IMAGING SITE: [facility] [address] (1.5 Tesla MRI)  FINDINGS: No abnormal lesions are seen on diffusion-weighted views to suggest acute ischemia. The cortical sulci, fissures and cisterns are notable for moderate-severe perisylvian and mesial temporal atrophy. Lateral, third and fourth ventricle are notable for moderate ventriculomegaly. No extra-axial fluid collections are seen. No evidence of mass effect or midline shift.  Moderate periventricular and subcortical chronic small vessel ischemic disease. On sagittal views the posterior fossa, pituitary gland and corpus callosum are notable for moderate corpus callosum atrophy. No evidence of intracranial hemorrhage on SWI views. The orbits and their contents, paranasal sinuses and calvarium are notable for bilateral lens extractions.  Intracranial flow voids are present.   Abnormal MRI brain (without) demonstrating:  1. Moderate-severe perisylvian and mesial temporal atrophy. Moderate corpus callosum atrophy. 2. Moderate ventriculomegaly on ex vacuo basis. 3. Moderate periventricular and subcortical chronic small vessel ischemic disease. 4. No acute findings. 5. Compared to MRI on 11/06/14, there has been some progression of atrophy. INTERPRETING PHYSICIAN: Suanne Marker, MD Certified in Neurology, Neurophysiology and Neuroimaging [facility] [address] [place], [place] [address] [phone]   US Renal  Result Date: 02/09/2016 CLINICAL DATA:  Hematuria with clots in catheter. Elevated BUN and creatinine. EXAM: RENAL / URINARY TRACT ULTRASOUND COMPLETE COMPARISON:  CT abdomen and pelvis 12/21/2015 FINDINGS: Right Kidney: Length: 11.5 cm. Moderate hydronephrosis and hydroureter. The right ureter can be followed down to the level of the bladder and no stones are identified. Renal parenchymal echotexture is normal. No focal mass lesions identified. Left Kidney: Length: 11.8 cm. Echogenicity within normal limits. No mass or hydronephrosis visualized. Bladder: There is a Foley catheter in the bladder. The bladder wall around the Foley catheter appears to be extremely thickened. This could represent marked bladder wall thickening versus mass or adherent hemorrhagic products. The bladder was also thickened and abnormal on prior CT scan. IMPRESSION: Right-sided hydronephrosis and hydroureter. Foley catheter deflects the bladder but the bladder wall is markedly thickened, possibly indicating tumor infiltration, inflammatory reaction, or adherent thrombus. Electronically Signed   By: Burman Nieves M.D.   On:  02/09/2016 02:26   Subjective: Shakes head to deny pain, nausea, difficulty breathing  Discharge Exam: Vitals:   02/13/16 0409 02/13/16 0943  BP: (!) 152/89   Pulse: 98   Resp: 20   Temp: 99.3 F (37.4 C) 99.2 F (37.3 C)   Vitals:   02/12/16 1954 02/12/16 2353 02/13/16 0409  02/13/16 0943  BP: (!) 145/81 (!) 155/86 (!) 152/89   Pulse: 89 95 98   Resp: 20 (!) 21 20   Temp: 99.1 F (37.3 C) 99.4 F (37.4 C) 99.3 F (37.4 C) 99.2 F (37.3 C)  TempSrc: Axillary Axillary Axillary Axillary  SpO2: 100% 100% 100%   Weight:      Height:        General: Pt is asleep and arouses but does not open eyes.  Barely nods or shakes head to answer questions HEENT:  Unable to open mouth on command.  apprears to have a dry tongue Cardiovascular: RRR, S1/S2 +, no rubs, no gallops Respiratory: CTA bilaterally, no wheezing, no rhonchi Abdominal: Soft, ND, TTP in the RLQ without rebound or guarding.  bowel sounds + Extremities:  Trace bilateral pedal edema, no cyanosis    The results of significant diagnostics from this hospitalization (including imaging, microbiology, ancillary and laboratory) are listed below for reference.     Microbiology: Recent Results (from the past 240 hour(s))  Culture, blood (routine x 2)     Status: None (Preliminary result)   Collection Time: 02/08/16  5:45 PM  Result Value Ref Range Status   Specimen Description BLOOD LEFT FOREARM  Final   Special Requests BOTTLES DRAWN AEROBIC AND ANAEROBIC 5ML  Final   Culture   Final    NO GROWTH 3 DAYS Performed at [facility], [address] [place], [place] [address]    Report Status PENDING  Incomplete  Culture, blood (routine x 2)     Status: None (Preliminary result)   Collection Time: 02/08/16  8:49 PM  Result Value Ref Range Status   Specimen Description BLOOD FEMORAL ARTERY  Final   Special Requests BOTTLES DRAWN AEROBIC AND ANAEROBIC 5ML  Final   Culture   Final    NO GROWTH 4 DAYS Performed at [facility], [address] [place], [place] [address]    Report Status PENDING  Incomplete  MRSA PCR Screening     Status: Abnormal   Collection Time: 02/09/16 12:30 PM  Result Value Ref Range Status   MRSA by PCR POSITIVE (A) NEGATIVE Final    Comment:        The GeneXpert  MRSA Assay (FDA approved for NASAL specimens only), is one component of a comprehensive MRSA colonization surveillance program. It is not intended to diagnose MRSA infection nor to guide or monitor treatment for MRSA infections. RESULT CALLED TO, READ BACK BY AND VERIFIED WITH: Z.SUGGS RN AT 1848 ON 02/09/16 BY S.VANHOORNE   Culture, Urine     Status: Abnormal   Collection Time: 02/10/16  1:19 PM  Result Value Ref Range Status   Specimen Description URINE, CATHETERIZED  Final   Special Requests NONE  Final   Culture 60,000 COLONIES/mL ENTEROCOCCUS FAECALIS (A)  Final   Report Status 02/12/2016 FINAL  Final   Organism ID, Bacteria ENTEROCOCCUS FAECALIS (A)  Final      Susceptibility   Enterococcus faecalis - MIC*    AMPICILLIN <=2 SENSITIVE Sensitive     LEVOFLOXACIN >=8 RESISTANT Resistant     NITROFURANTOIN <=16 SENSITIVE Sensitive  VANCOMYCIN 1 SENSITIVE Sensitive     * 60,000 COLONIES/mL ENTEROCOCCUS FAECALIS     Labs: BNP (last 3 results) No results for input(s): BNP in the last 8760 hours. Basic Metabolic Panel:  Recent Labs Lab 02/09/16 0613 02/10/16 0342 02/11/16 0348 02/12/16 0526 02/13/16 0441  NA 141 142 146* 149* 143  K 3.5 3.0* 3.2* 3.0* 3.6  CL 112* 115* 121* 122* 119*  CO2 18* 18* 18* 18* 16*  GLUCOSE 95 106* 111* 103* 105*  BUN 41* 37* 38* 40* 43*  CREATININE 2.65* 2.25* 2.39* 2.66* 3.40*  CALCIUM 8.5* 8.3* 9.1 9.8 9.4  MG  --  1.6*  --   --   --    Liver Function Tests:  Recent Labs Lab 02/08/16 1800  AST 32  ALT 13*  ALKPHOS 87  BILITOT 0.3  PROT 9.8*  ALBUMIN 2.6*   No results for input(s): LIPASE, AMYLASE in the last 168 hours. No results for input(s): AMMONIA in the last 168 hours. CBC:  Recent Labs Lab 02/09/16 0538  02/09/16 1919 02/10/16 0342 02/11/16 0348 02/12/16 0526 02/13/16 0441  WBC 24.4*  --   --  23.9* 23.4* 24.7* 27.7*  HGB 6.6*  < > 12.5* 10.7* 10.6* 10.7* 10.1*  HCT 20.3*  < > 36.8* 32.1* 32.2* 32.5* 31.5*   MCV 75.2*  --   --  78.1 78.9 79.5 80.6  PLT 408*  --   --  277 249 230 209  < > = values in this interval not displayed. Cardiac Enzymes: No results for input(s): CKTOTAL, CKMB, CKMBINDEX, TROPONINI in the last 168 hours. BNP: Invalid input(s): POCBNP CBG: No results for input(s): GLUCAP in the last 168 hours. D-Dimer No results for input(s): DDIMER in the last 72 hours. Hgb A1c No results for input(s): HGBA1C in the last 72 hours. Lipid Profile No results for input(s): CHOL, HDL, LDLCALC, TRIG, CHOLHDL, LDLDIRECT in the last 72 hours. Thyroid function studies No results for input(s): TSH, T4TOTAL, T3FREE, THYROIDAB in the last 72 hours.  Invalid input(s): FREET3 Anemia work up No results for input(s): VITAMINB12, FOLATE, FERRITIN, TIBC, IRON, RETICCTPCT in the last 72 hours. Urinalysis    Component Value Date/Time   COLORURINE RED (A) 02/08/2016 1614   APPEARANCEUR CLOUDY (A) 02/08/2016 1614   APPEARANCEUR Cloudy (A) 01/06/2016 1618   LABSPEC 1.013 02/08/2016 1614   PHURINE 6.0 02/08/2016 1614   GLUCOSEU NEGATIVE 02/08/2016 1614   HGBUR LARGE (A) 02/08/2016 1614   BILIRUBINUR NEGATIVE 02/08/2016 1614   BILIRUBINUR Negative 01/06/2016 1618   KETONESUR NEGATIVE 02/08/2016 1614   PROTEINUR >=300 (A) 02/08/2016 1614   UROBILINOGEN 0.2 11/08/2014 0334   NITRITE NEGATIVE 02/08/2016 1614   LEUKOCYTESUR LARGE (A) 02/08/2016 1614   LEUKOCYTESUR 3+ (A) 01/06/2016 1618   Sepsis Labs Invalid input(s): PROCALCITONIN,  WBC,  LACTICIDVEN   Time coordinating discharge: Over 30 minutes  SIGNED:   Renae Fickle, MD  [facility] 02/13/2016, 11:58 AM Pager   If 7PM-7AM, please contact night-coverage [email or web address] Password [email or web address]

## 2016-02-13 NOTE — Progress Notes (Signed)
[facility] Hospital Liaison: RN  Notified by Veto KempsAlesia, CMRN, of patient/family request for [facility] at [facility] after discharge.  Chart and patient information review with Dr. Berlinda Last. [place], [facility] Medical Director, and hospice eligibility confirmed.  Writer spoke with Stanton KidneyDebra, daughter, over the phone to confirm interest and initiate education related to hospice philosophy, services and team approach to care.  Family voiced understanding of information provided.    Per discussion, plan is to discharge home by PTAR on 02/13/16.  Please send signed and completed DNR form home with patient/family.  Patient will need prescriptions for discharge comfort medications.  DME needs discussed, patient already has hospital bed, walker, wheelchair, BSC, and shower chair.  Daughter declined any new equipment at this time.    The home address has been verified and is correct in the chart.  [facility] Referral Center aware of the above.   Completed d/c summary will need to be faxed to [facility] at [phone] when final.  Please notify [facility] when patient is ready to leave unit at discharge call [phone] or [phone] after 5pm.  [facility] information and contact numbers have been given to [facility] during phone call.  Above information shared with Cathlean Cowerlesia, [facility].  Thank you for the referral.  Adele BarthelAmy Evans, RN, BSN [facility] Hospital Liaison [phone]  [facility] hospital liaison's are now on AMION.

## 2016-02-13 NOTE — Care Management Note (Signed)
Case Management Note  Patient Details  Name: Jeremy [name] MRN: [id number] Date of Birth: Oct 28, 1925  Subjective/Objective:      Advanced Dementia              Action/Plan: Discharge Planning:  NCM spoke to pt's dtr, Jeremy [name] at bedside. States pt will dc home with Hospice to other dtr's home, [name] Offered choice for Hospice and dtr selected [facility]. Pt has hospital bed, RW, and bedside commode. Contacted [facility] Liaison, Jeremy [name] with new referral with a plan dc home today. PTAR transport needed.    PCP Jeremy [name], Jeremy [name]  Expected Discharge Date:  02/13/2016              Expected Discharge Plan:  Home w Hospice Care  In-House Referral:  Clinical Social Work  Discharge planning Services  CM Consult  Post Acute Care Choice:  Hospice Choice offered to:  Adult Children  DME Arranged:  N/A DME Agency:  NA  HH Arranged:  RN HH Agency:  [facility]  Status of Service:  Completed, signed off  If discussed at Long Length of Stay Meetings, dates discussed:    Additional Comments:  Jeremy [name], Jeremy Pandya Ellen, RN 02/13/2016, 10:35 AM

## 2016-02-13 NOTE — Progress Notes (Signed)
Patient discharged home with hospice via EMS. Patient alert, unable to verbalize need, no distress noted, family at the bedside during transportation. Discharge instructions given and explained to daughter and she verbalized understanding.

## 2016-02-14 LAB — CULTURE, BLOOD (ROUTINE X 2): Culture: NO GROWTH

## 2016-03-02 DEATH — deceased

## 2016-03-15 ENCOUNTER — Ambulatory Visit: Payer: Medicare Other | Admitting: Podiatry

## 2016-03-22 ENCOUNTER — Telehealth: Payer: Self-pay | Admitting: Neurology

## 2016-03-22 NOTE — Telephone Encounter (Signed)
[name] called to advise the pt died 11-23-2016

## 2016-03-27 ENCOUNTER — Ambulatory Visit: Payer: PPO | Admitting: Neurology

## 2016-04-01 IMAGING — DX DG CHEST 2V
3 series · 3 of 3 positions shown · non-contrast
Comparison: January 26, 2014.

CLINICAL DATA: Altered mental status.

EXAM:
CHEST  2 VIEW

[chest lat]
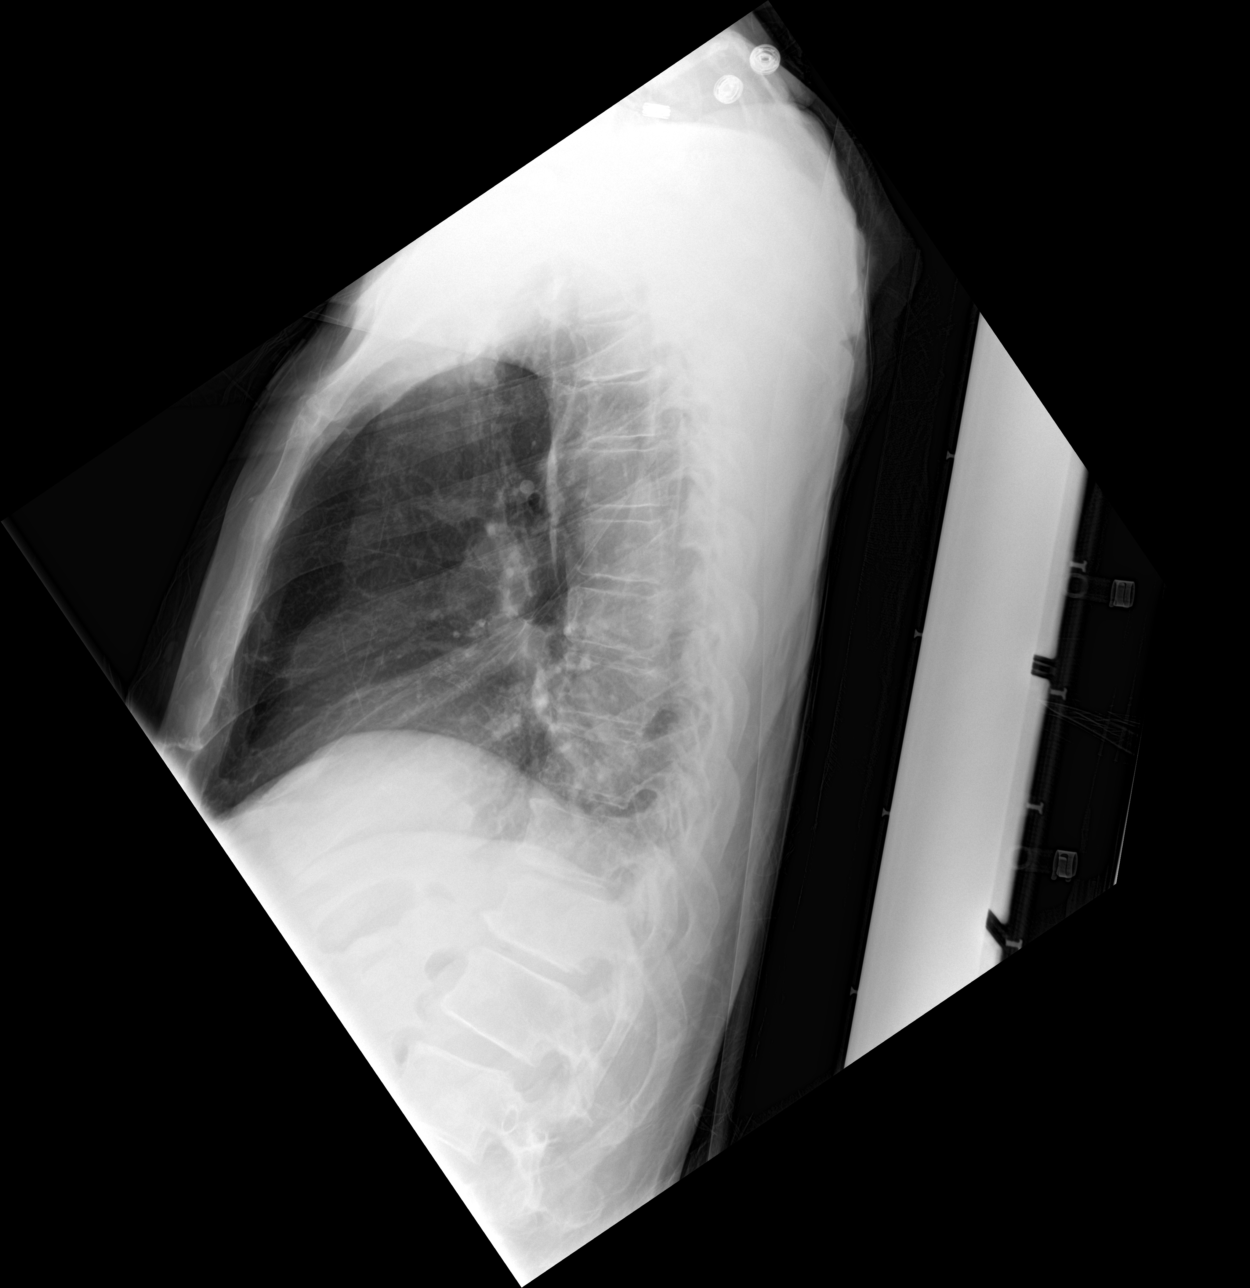

[chest ap (1 of 2)]
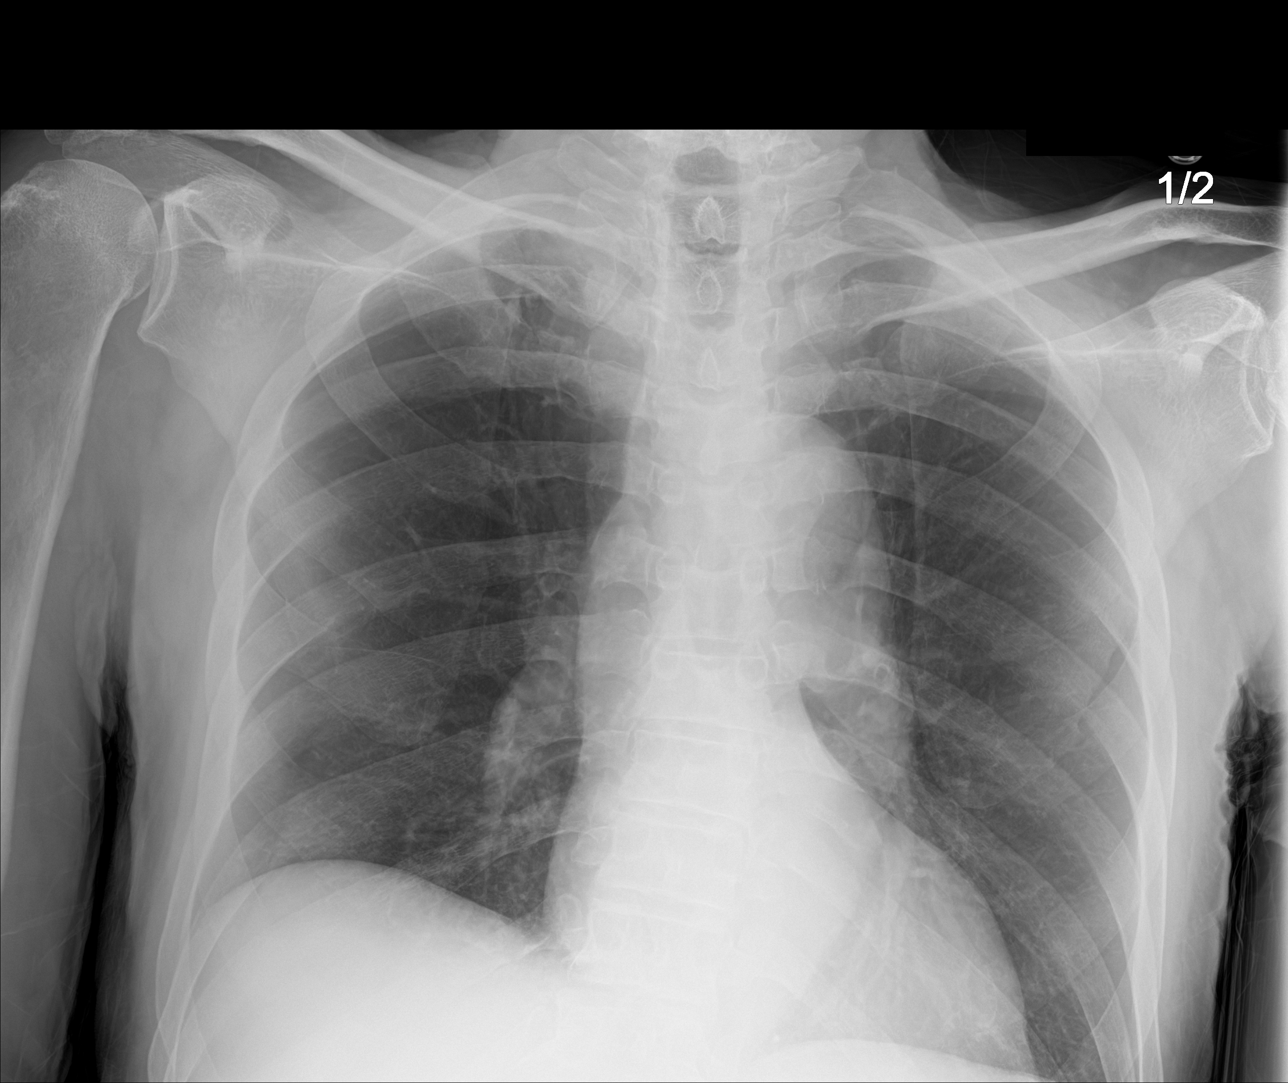

[chest ap (2 of 2)]
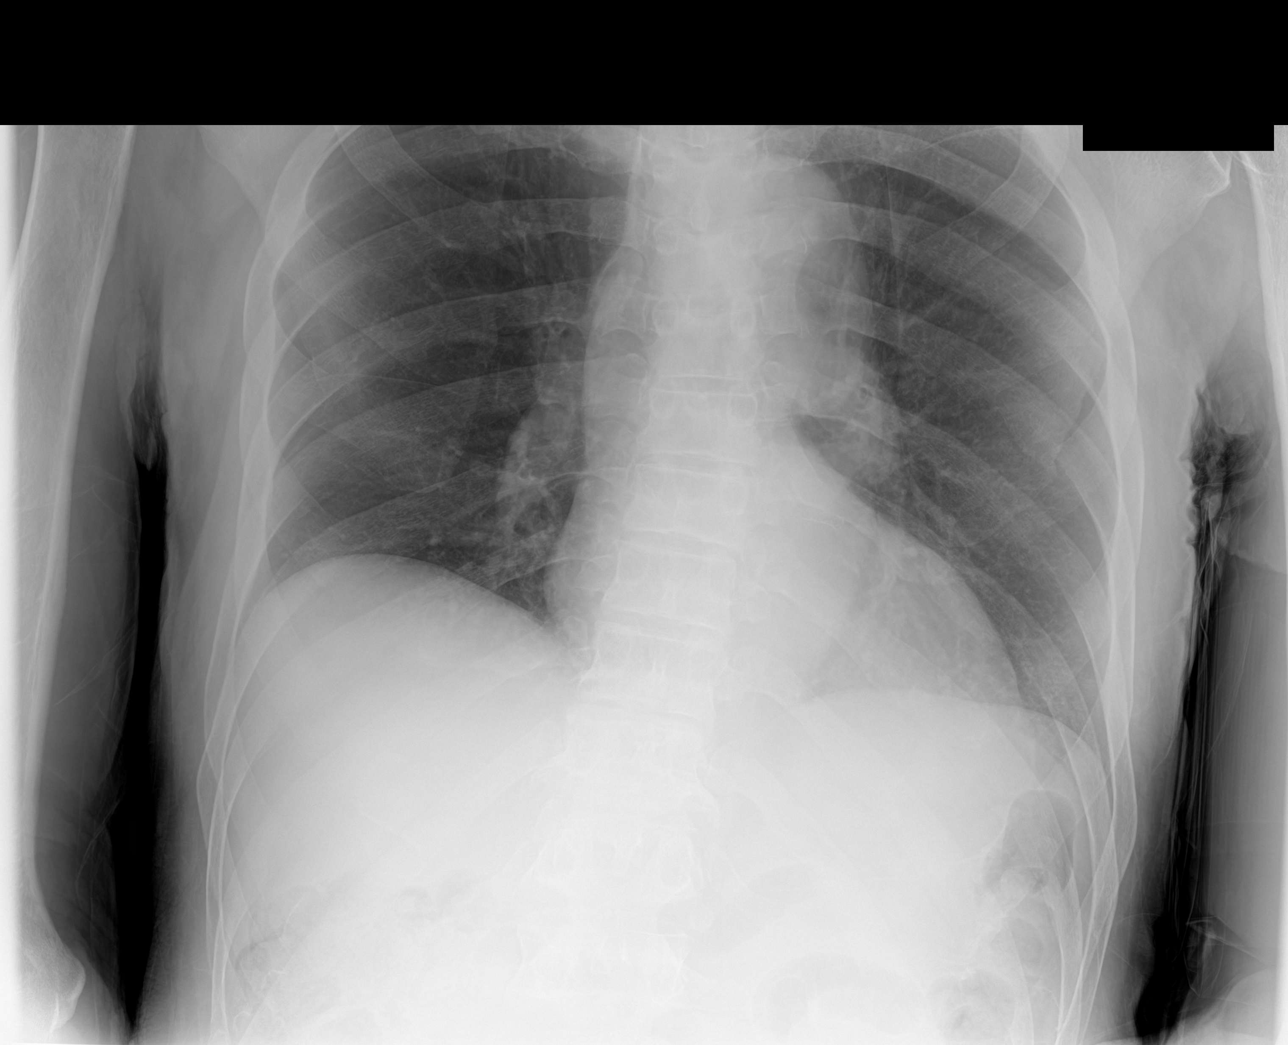

[3 of 3 positions shown; findings below may reference images not displayed]

FINDINGS: The heart size and mediastinal contours are within normal limits.
Both lungs are clear. No pneumothorax or pleural effusion is noted.
The visualized skeletal structures are unremarkable.
IMPRESSION: No active cardiopulmonary disease.

## 2016-04-01 IMAGING — CR DG SHOULDER 2+V*R*
2 series · 2 of 2 positions shown · non-contrast
Comparison: None.

CLINICAL DATA: Acute right shoulder pain without reported injury.

EXAM:
RIGHT SHOULDER - 2+ VIEW

[shoulder grashey]
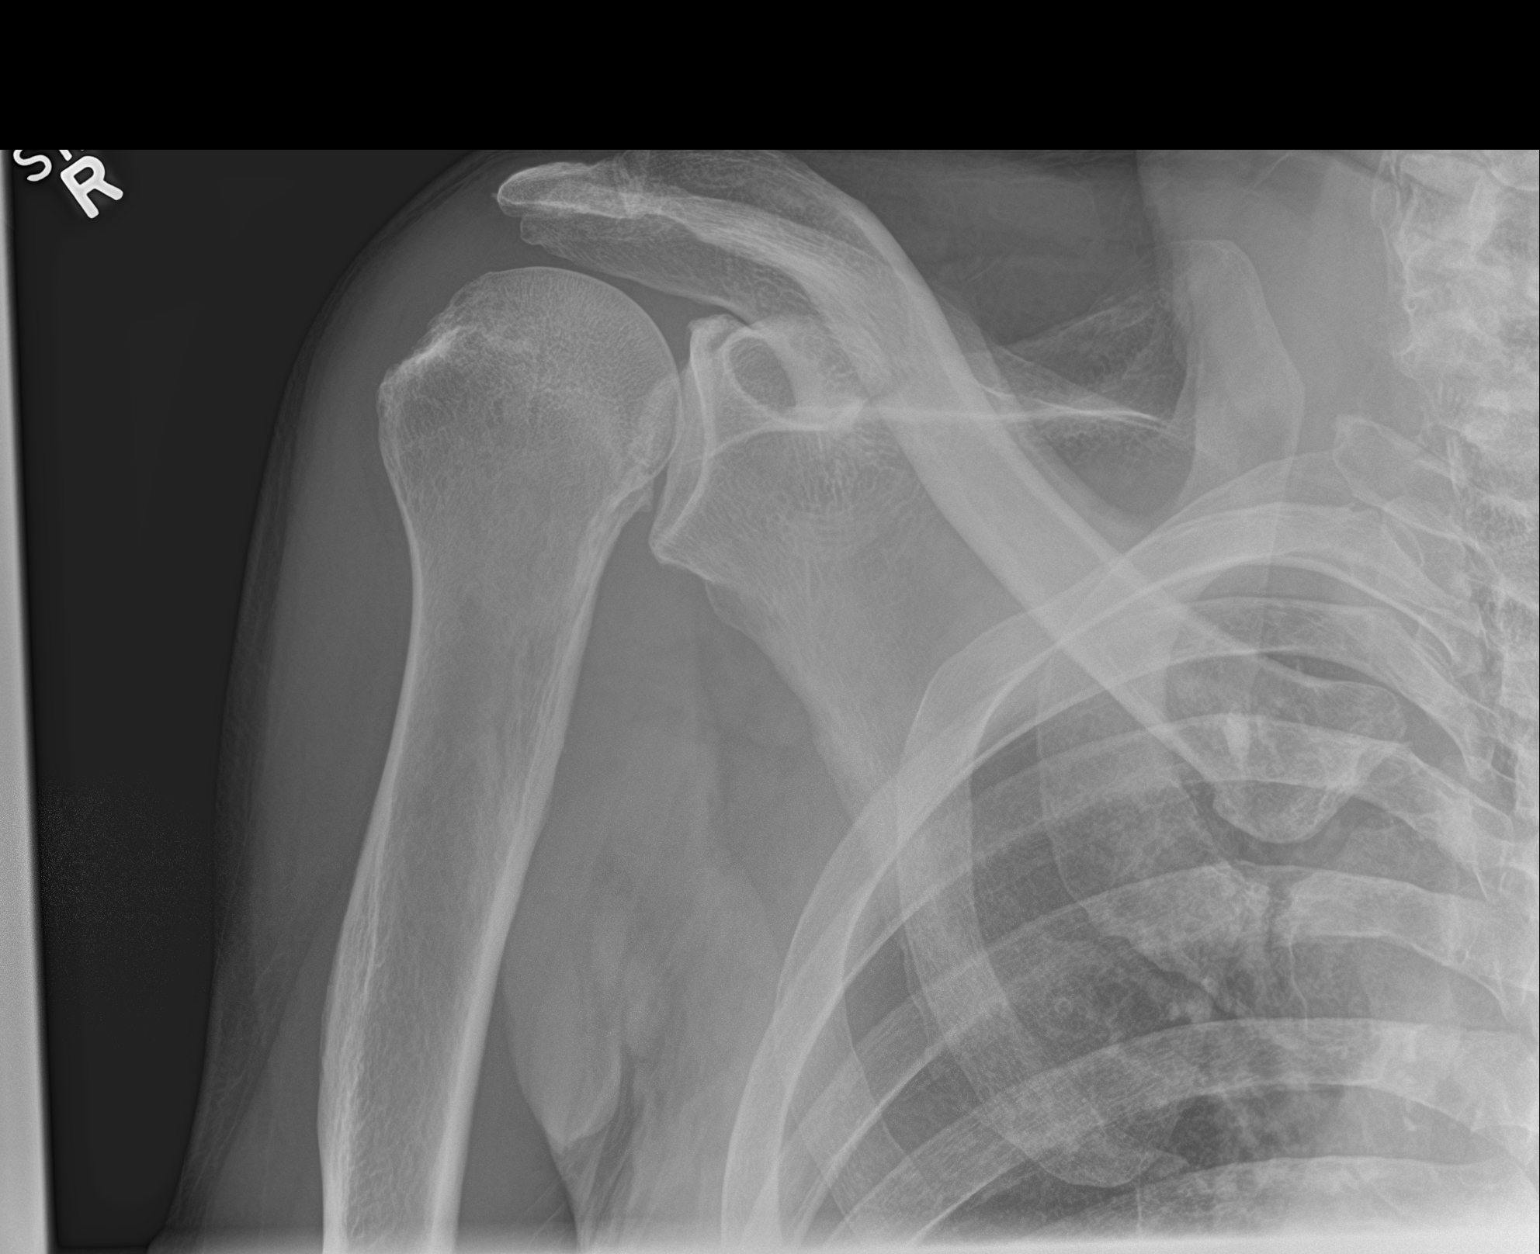

[shoulder y view]
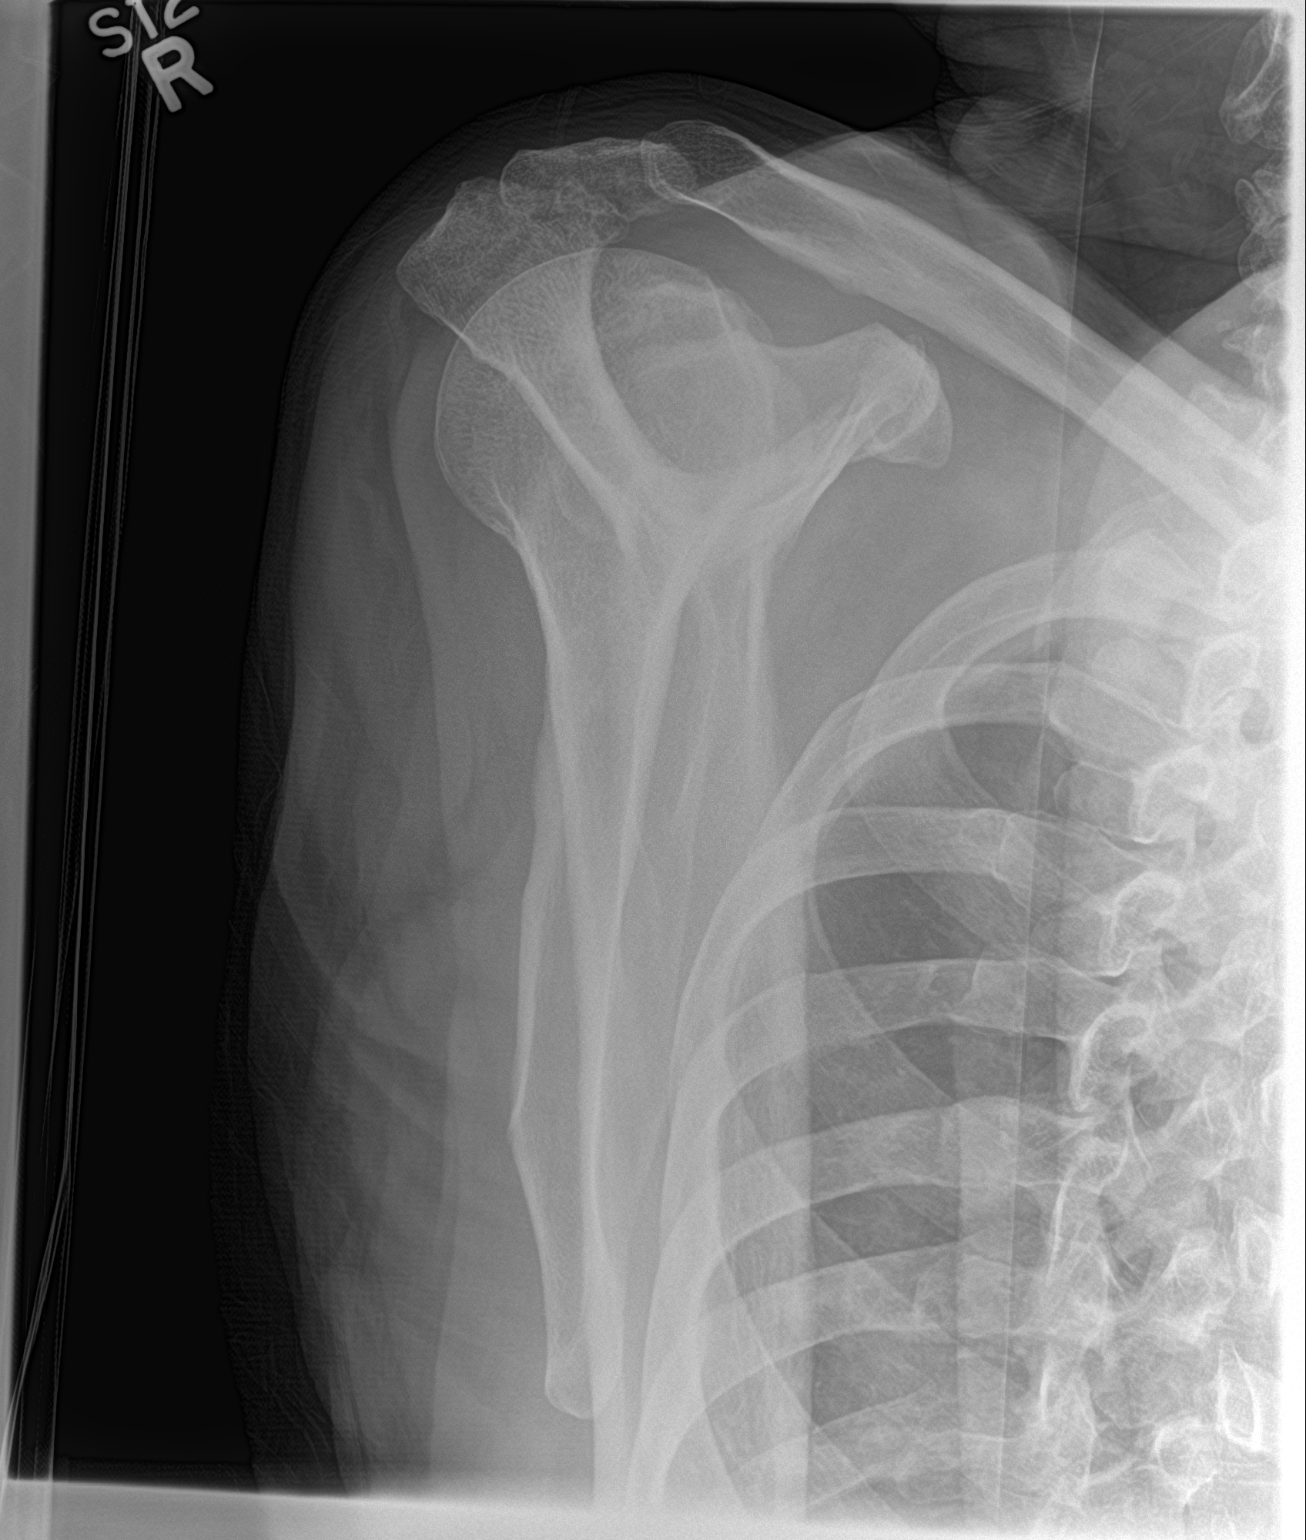

[2 of 2 positions shown; findings below may reference images not displayed]

FINDINGS: There is no evidence of fracture or dislocation. There is no
evidence of arthropathy or other focal bone abnormality. Soft
tissues are unremarkable.
IMPRESSION: Normal right shoulder.
